# Patient Record
Sex: Female | Born: 1945 | Race: White | Hispanic: No | Marital: Married | Smoking: Never smoker
Health system: Southern US, Community
[De-identification: ages and names within clinical notes are randomized; demographics above are authoritative.]

## PROBLEM LIST (undated history)

## (undated) DIAGNOSIS — I639 Cerebral infarction, unspecified: Secondary | ICD-10-CM

## (undated) DIAGNOSIS — R198 Other specified symptoms and signs involving the digestive system and abdomen: Secondary | ICD-10-CM

## (undated) DIAGNOSIS — F419 Anxiety disorder, unspecified: Secondary | ICD-10-CM

## (undated) DIAGNOSIS — H269 Unspecified cataract: Secondary | ICD-10-CM

## (undated) DIAGNOSIS — N63 Unspecified lump in unspecified breast: Secondary | ICD-10-CM

## (undated) DIAGNOSIS — K5909 Other constipation: Secondary | ICD-10-CM

## (undated) DIAGNOSIS — M1990 Unspecified osteoarthritis, unspecified site: Secondary | ICD-10-CM

## (undated) DIAGNOSIS — I10 Essential (primary) hypertension: Secondary | ICD-10-CM

## (undated) DIAGNOSIS — D649 Anemia, unspecified: Secondary | ICD-10-CM

## (undated) DIAGNOSIS — T7840XA Allergy, unspecified, initial encounter: Secondary | ICD-10-CM

## (undated) DIAGNOSIS — Z8673 Personal history of transient ischemic attack (TIA), and cerebral infarction without residual deficits: Secondary | ICD-10-CM

## (undated) DIAGNOSIS — R519 Headache, unspecified: Secondary | ICD-10-CM

## (undated) DIAGNOSIS — R112 Nausea with vomiting, unspecified: Secondary | ICD-10-CM

## (undated) DIAGNOSIS — G2581 Restless legs syndrome: Secondary | ICD-10-CM

## (undated) DIAGNOSIS — C801 Malignant (primary) neoplasm, unspecified: Secondary | ICD-10-CM

## (undated) DIAGNOSIS — IMO0002 Reserved for concepts with insufficient information to code with codable children: Secondary | ICD-10-CM

## (undated) DIAGNOSIS — Z8719 Personal history of other diseases of the digestive system: Secondary | ICD-10-CM

## (undated) DIAGNOSIS — L719 Rosacea, unspecified: Secondary | ICD-10-CM

## (undated) DIAGNOSIS — K219 Gastro-esophageal reflux disease without esophagitis: Secondary | ICD-10-CM

## (undated) DIAGNOSIS — J45998 Other asthma: Secondary | ICD-10-CM

## (undated) DIAGNOSIS — Z8709 Personal history of other diseases of the respiratory system: Secondary | ICD-10-CM

## (undated) DIAGNOSIS — T753XXA Motion sickness, initial encounter: Secondary | ICD-10-CM

## (undated) DIAGNOSIS — R351 Nocturia: Secondary | ICD-10-CM

## (undated) DIAGNOSIS — L570 Actinic keratosis: Secondary | ICD-10-CM

## (undated) DIAGNOSIS — G4733 Obstructive sleep apnea (adult) (pediatric): Secondary | ICD-10-CM

## (undated) DIAGNOSIS — I5189 Other ill-defined heart diseases: Secondary | ICD-10-CM

## (undated) DIAGNOSIS — J189 Pneumonia, unspecified organism: Secondary | ICD-10-CM

## (undated) DIAGNOSIS — M5030 Other cervical disc degeneration, unspecified cervical region: Secondary | ICD-10-CM

## (undated) DIAGNOSIS — J45909 Unspecified asthma, uncomplicated: Secondary | ICD-10-CM

## (undated) DIAGNOSIS — I272 Pulmonary hypertension, unspecified: Secondary | ICD-10-CM

## (undated) DIAGNOSIS — G4730 Sleep apnea, unspecified: Secondary | ICD-10-CM

## (undated) DIAGNOSIS — Z9889 Other specified postprocedural states: Secondary | ICD-10-CM

## (undated) DIAGNOSIS — Z9289 Personal history of other medical treatment: Secondary | ICD-10-CM

## (undated) DIAGNOSIS — K449 Diaphragmatic hernia without obstruction or gangrene: Secondary | ICD-10-CM

## (undated) DIAGNOSIS — N393 Stress incontinence (female) (male): Secondary | ICD-10-CM

## (undated) HISTORY — PX: VAGINAL HYSTERECTOMY: SUR661

## (undated) HISTORY — DX: Anxiety disorder, unspecified: F41.9

## (undated) HISTORY — PX: BREAST EXCISIONAL BIOPSY: SUR124

## (undated) HISTORY — DX: Gastro-esophageal reflux disease without esophagitis: K21.9

## (undated) HISTORY — DX: Pulmonary hypertension, unspecified: I27.20

## (undated) HISTORY — DX: Cerebral infarction, unspecified: I63.9

## (undated) HISTORY — DX: Allergy, unspecified, initial encounter: T78.40XA

## (undated) HISTORY — DX: Unspecified cataract: H26.9

## (undated) HISTORY — DX: Other cervical disc degeneration, unspecified cervical region: M50.30

## (undated) HISTORY — DX: Sleep apnea, unspecified: G47.30

## (undated) HISTORY — DX: Other ill-defined heart diseases: I51.89

## (undated) HISTORY — DX: Obstructive sleep apnea (adult) (pediatric): G47.33

## (undated) HISTORY — DX: Unspecified asthma, uncomplicated: J45.909

## (undated) HISTORY — DX: Rosacea, unspecified: L71.9

## (undated) HISTORY — DX: Actinic keratosis: L57.0

## (undated) HISTORY — PX: UPPER GASTROINTESTINAL ENDOSCOPY: SHX188

## (undated) HISTORY — PX: SHOULDER ARTHROSCOPY DISTAL CLAVICLE EXCISION AND OPEN ROTATOR CUFF REPAIR: SHX2396

---
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
## 1983-12-01 DIAGNOSIS — I639 Cerebral infarction, unspecified: Secondary | ICD-10-CM

## 1983-12-01 HISTORY — DX: Cerebral infarction, unspecified: I63.9

## 1999-08-18 ENCOUNTER — Ambulatory Visit (HOSPITAL_COMMUNITY): Admission: RE | Admit: 1999-08-18 | Discharge: 1999-08-18 | Payer: Self-pay | Admitting: Gastroenterology

## 2000-02-04 ENCOUNTER — Other Ambulatory Visit: Admission: RE | Admit: 2000-02-04 | Discharge: 2000-02-04 | Payer: Self-pay | Admitting: Family Medicine

## 2001-04-15 ENCOUNTER — Encounter: Admission: RE | Admit: 2001-04-15 | Discharge: 2001-04-15 | Payer: Self-pay | Admitting: Gastroenterology

## 2001-04-15 ENCOUNTER — Encounter: Payer: Self-pay | Admitting: Gastroenterology

## 2002-03-03 ENCOUNTER — Encounter: Payer: Self-pay | Admitting: Family Medicine

## 2002-03-03 ENCOUNTER — Encounter: Admission: RE | Admit: 2002-03-03 | Discharge: 2002-03-03 | Payer: Self-pay | Admitting: Family Medicine

## 2003-10-04 ENCOUNTER — Ambulatory Visit (HOSPITAL_COMMUNITY): Admission: RE | Admit: 2003-10-04 | Discharge: 2003-10-04 | Payer: Self-pay | Admitting: Family Medicine

## 2003-11-13 ENCOUNTER — Encounter: Admission: RE | Admit: 2003-11-13 | Discharge: 2003-11-13 | Payer: Self-pay | Admitting: Family Medicine

## 2004-04-14 ENCOUNTER — Encounter (INDEPENDENT_AMBULATORY_CARE_PROVIDER_SITE_OTHER): Payer: Self-pay | Admitting: *Deleted

## 2004-04-14 ENCOUNTER — Ambulatory Visit (HOSPITAL_COMMUNITY): Admission: RE | Admit: 2004-04-14 | Discharge: 2004-04-14 | Payer: Self-pay | Admitting: Gastroenterology

## 2004-11-25 ENCOUNTER — Emergency Department (HOSPITAL_COMMUNITY): Admission: EM | Admit: 2004-11-25 | Discharge: 2004-11-25 | Payer: Self-pay | Admitting: Emergency Medicine

## 2004-11-28 ENCOUNTER — Ambulatory Visit: Payer: Self-pay | Admitting: Internal Medicine

## 2004-12-10 ENCOUNTER — Ambulatory Visit: Payer: Self-pay

## 2004-12-10 ENCOUNTER — Ambulatory Visit: Payer: Self-pay | Admitting: Internal Medicine

## 2005-01-01 ENCOUNTER — Ambulatory Visit: Payer: Self-pay | Admitting: Internal Medicine

## 2006-07-14 ENCOUNTER — Encounter: Admission: RE | Admit: 2006-07-14 | Discharge: 2006-07-14 | Payer: Self-pay | Admitting: Family Medicine

## 2006-07-14 ENCOUNTER — Encounter (INDEPENDENT_AMBULATORY_CARE_PROVIDER_SITE_OTHER): Payer: Self-pay | Admitting: Diagnostic Radiology

## 2007-02-23 ENCOUNTER — Encounter: Admission: RE | Admit: 2007-02-23 | Discharge: 2007-02-23 | Payer: Self-pay | Admitting: Family Medicine

## 2007-03-14 ENCOUNTER — Encounter: Admission: RE | Admit: 2007-03-14 | Discharge: 2007-03-14 | Payer: Self-pay | Admitting: Family Medicine

## 2007-06-21 ENCOUNTER — Encounter: Admission: RE | Admit: 2007-06-21 | Discharge: 2007-06-21 | Payer: Self-pay | Admitting: Family Medicine

## 2007-09-14 ENCOUNTER — Ambulatory Visit: Payer: Self-pay | Admitting: Cardiology

## 2007-09-14 ENCOUNTER — Inpatient Hospital Stay (HOSPITAL_COMMUNITY): Admission: EM | Admit: 2007-09-14 | Discharge: 2007-09-15 | Payer: Self-pay | Admitting: Emergency Medicine

## 2007-09-14 ENCOUNTER — Encounter (INDEPENDENT_AMBULATORY_CARE_PROVIDER_SITE_OTHER): Payer: Self-pay | Admitting: Neurology

## 2007-09-14 HISTORY — PX: OTHER SURGICAL HISTORY: SHX169

## 2007-09-30 ENCOUNTER — Encounter: Admission: RE | Admit: 2007-09-30 | Discharge: 2007-09-30 | Payer: Self-pay | Admitting: Gastroenterology

## 2007-12-01 HISTORY — PX: OTHER SURGICAL HISTORY: SHX169

## 2007-12-01 HISTORY — PX: CARPAL TUNNEL RELEASE: SHX101

## 2008-02-09 ENCOUNTER — Ambulatory Visit (HOSPITAL_BASED_OUTPATIENT_CLINIC_OR_DEPARTMENT_OTHER): Admission: RE | Admit: 2008-02-09 | Discharge: 2008-02-09 | Payer: Self-pay | Admitting: Orthopedic Surgery

## 2008-03-15 ENCOUNTER — Other Ambulatory Visit: Admission: RE | Admit: 2008-03-15 | Discharge: 2008-03-15 | Payer: Self-pay | Admitting: Family Medicine

## 2008-04-05 ENCOUNTER — Ambulatory Visit (HOSPITAL_BASED_OUTPATIENT_CLINIC_OR_DEPARTMENT_OTHER): Admission: RE | Admit: 2008-04-05 | Discharge: 2008-04-05 | Payer: Self-pay | Admitting: Orthopedic Surgery

## 2008-07-09 ENCOUNTER — Encounter: Admission: RE | Admit: 2008-07-09 | Discharge: 2008-07-09 | Payer: Self-pay | Admitting: Family Medicine

## 2009-01-08 ENCOUNTER — Ambulatory Visit: Payer: Self-pay | Admitting: Diagnostic Radiology

## 2009-01-08 ENCOUNTER — Emergency Department (HOSPITAL_BASED_OUTPATIENT_CLINIC_OR_DEPARTMENT_OTHER): Admission: EM | Admit: 2009-01-08 | Discharge: 2009-01-08 | Payer: Self-pay | Admitting: Emergency Medicine

## 2009-10-03 ENCOUNTER — Encounter: Admission: RE | Admit: 2009-10-03 | Discharge: 2009-10-03 | Payer: Self-pay | Admitting: Family Medicine

## 2010-10-14 ENCOUNTER — Encounter: Admission: RE | Admit: 2010-10-14 | Discharge: 2010-10-14 | Payer: Self-pay | Admitting: Family Medicine

## 2011-04-14 NOTE — Op Note (Signed)
NAMEMarland Vang  METZTLI, SACHDEV             ACCOUNT NO.:  [id number]   MEDICAL RECORD NO.:  [id number]          PATIENT TYPE:  AMB   LOCATION:  DSC                          FACILITY:  [facility]   PHYSICIAN:  Cindee Salt, M.D.       DATE OF BIRTH:  1946/09/18   DATE OF PROCEDURE:  02/09/2008  DATE OF DISCHARGE:                               OPERATIVE REPORT   PREOPERATIVE DIAGNOSIS:  Stenosing tenosynovitis right thumb, carpal  tunnel release right hand.   POSTOPERATIVE DIAGNOSIS:  Stenosing tenosynovitis right thumb, carpal  tunnel release right hand.   PROCEDURE:  Decompression right median nerve, release A1 pulley right  thumb.   SURGEON:  Cindee Salt, M.D.   ASSISTANTCarolyne Fiscal.   ANESTHESIA:  General.   ANESTHESIOLOGIST:  Quita Skye. Krista Blue, M.D.   INDICATIONS FOR PROCEDURE:  The patient is a 64 year old female with a  history of triggering of her right thumb not responsive to conservative  treatment, carpal tunnel syndrome, EMG nerve conduction is positive,  also not responsive to conservative treatment.  She has elected to  undergo surgical decompression.  Postoperative course has been discussed  with the patient along with risks and complications.  She is aware there  is no guarantee with surgery.  Possibility of infection, recurrence of  injury  to nerves, tendons, resulting in symptoms of dystrophy in the  preoperative area the patient had seen.  Questions again encouraged and  answered.  The extremity marked by both patient and surgeon.  Antibiotics given.   DESCRIPTION OF PROCEDURE:  The patient is brought to the operating room  where a general anesthesia was carried out without difficulty.  She was  prepped using DuraPrep in the supine position, right arm free.  A  transverse incision was made over the A1 pulley of the right thumb and  carried down through the subcutaneous tissue.  Bleeders were  electrocauterized.  Neurovascular structures were identified and  protected.   Retractors were placed.  An incision was then made on the  radial aspect of the A1 pulley protecting the oblique pulley.  Thumb  placed through a full range of motion.  Compression to the tendon was  apparent.  No further triggering was noted.  This was irrigated and  closed with interrupted 4-0 Vicryl Rapide sutures.  A separate  longitudinal incision was then made in the palm, carried down through  the subcutaneous tissue.  Bleeders were again electrocauterized and  dissection carried down to the flexor retinaculum.  The superficial  palmar arch was identified.  The flexor tendon in the little finger  identified to the ulnar side of the median nerve.  Carpal retinaculum  was incised with sharp dissection.  Right angle and Sewell retractor  were placed between the skin and forearm fascia.  The fascia was  released for approximately 1.5 cm proximal to the wrist crease under  direct vision.  Canal was explored.  Area of compression to the nerve  was apparent.  No further lesions were identified.  The wound was  irrigated.  The skin was then closed  with interrupted 4-0 Vicryl  Rapid sutures.  A sterile compressive  dressing and wrist splint with the fingers left free.  The patient  tolerated the procedure well and was taken to the recovery room for  observation in satisfactory condition.  She will be discharged home to  return to the [facility] [place] in one week on Vicodin.           ______________________________  Cindee Salt, M.D.     GK/MEDQ  D:  02/09/2008  T:  02/09/2008  Job:  [id number]   cc:   Dario Guardian, M.D.

## 2011-04-14 NOTE — Discharge Summary (Signed)
NAMEJANEAL, ABADI             ACCOUNT NO.:  [id number]   MEDICAL RECORD NO.:  [id number]          PATIENT TYPE:  INP   LOCATION:  3739                         FACILITY:  [facility]   PHYSICIAN:  Pramod P. Pearlean Brownie, MD    DATE OF BIRTH:  1946/11/12   DATE OF ADMISSION:  09/13/2007  DATE OF DISCHARGE:  09/15/2007                               DISCHARGE SUMMARY   DISCHARGE DIAGNOSES:  1. Transient ischemic attack with symptoms resolved.  2. Severe dyspepsia.  3. Hypertension.  4. Hypokalemia, resolved.   DISCHARGE MEDICATIONS:  1. Aciphex 20 mg a day.  2. Hydrochlorothiazide 25 mg a day.  3. Potassium 20 mEq a day.  4. Zoloft 25 mg a day.  5. Ambien 5 mg q.h.s.  6. Zyrtec 1/2 tablet a day.  7. Fish oil 1000 mg b.i.d.  8. Estroven 1 a day.  9. Calcium with D 1200 mg a day.  10.Vitamin C 1000 mg a day.  11.Vitamin E 400 international units a day.  12.Ginkgo biloba 30 mg b.i.d.  13.Plavix 75 mg a day.   STUDIES PERFORMED:  1. CT of the brain on admission shows no acute abnormality.  2. Chest x-ray shows no acute disease.  3. MRI of the brain shows no acute stroke.  4. MRA of the brain negative.  5. 2-D echocardiogram shows EF of 65% with no source of embolus.  6. Transcranial Doppler performed, results pending.  7. Carotid Doppler pending at time of discharge.  8. EKG shows normal sinus rhythm, cannot rule out anterior infarct.   LABORATORY STUDIES:  CBC with hemoglobin 11.7, hematocrit 33.9,  otherwise normal.  Chemistry with glucose 101, otherwise normal.  Coagulation studies normal.  Liver function tests normal.  Cardiac  enzymes negative.  Cholesterol 171, triglycerides 208, HDL 29, LDL 100.  Urinalysis is negative.  Homocystine 6.3.  Urine drug screen positive  for benzodiazepines.  Alcohol level less than 5.  Urine pregnancy  negative.  Hemoglobin A1c 5.4.   HISTORY OF PRESENT ILLNESS:  Amy Vang is a 65 year old right-handed  Caucasian female with multiple medical  problems who presents with acute  onset at 10:15 p.m. of generalized weakness, left upper greater than  left lower extremity heaviness and weakness as well as dysarthria.  The  patient also complained of a headache at the top of the head, some  nausea, and blurry double vision which resolved.  She had some dizziness  and unsteady feelings which also resolved.  She remains with slight  heaviness on the left side.  The family feels her speech is still  slurred.  She was admitted to the hospital for further stroke  evaluation.  She was not a TPA candidate secondary to quick resolution  of symptoms.   HOSPITAL COURSE:  MRI was negative for acute stroke.  It is felt that  the patient had a TIA with symptoms that resolved.  She was placed on  Plavix for secondary stroke prevention, as she has severe dyspepsia and  unable to tolerate aspirin, including low-dose aspirin and Aggrenox.  The patient has vascular risk factors of some mild  dyslipidemia for  which she is already on a statin.  She is slightly overweight and has  hypertension.  In the hospital, she had some hypokalemia which was  treated and resolved.  She has no therapy needs.  Therefore, she  is  stable for discharge home.  Will consider outpatient IRIS trial for her  at time of followup.   CONDITION ON DISCHARGE:  Patient alert and oriented x3.  No aphasia.  No  dysarthria.  Her eye movements are full, and she has no focal weakness   DISCHARGE PLAN:  1. Discharge home with family.  2. Plavix for stroke prevention.  3. Ongoing risk factor control by primary care physician, with goal      LDL less than 100, goal systolic blood pressure less than 130, and      goal hemoglobin A1c less than 6.5.  4. Follow up with primary care physician within 1 month.  5. Follow up with Dr. Delia Heady in 2-3 months.  6. Consider IRIS trial as outpatient.      Annie Main, N.P.    ______________________________  Sunny Schlein. Pearlean Brownie, MD     SB/MEDQ  D:  09/15/2007  T:  09/16/2007  Job:  [id number]   cc:   Pramod P. Pearlean Brownie, MD

## 2011-04-14 NOTE — H&P (Signed)
NAMEAASIA, PEAVLER             ACCOUNT NO.:  [id number]   MEDICAL RECORD NO.:  [id number]          PATIENT TYPE:  INP   LOCATION:  3731                         FACILITY:  [facility]   PHYSICIAN:  Bevelyn Buckles. Champey, M.D.DATE OF BIRTH:  04/17/1946   DATE OF ADMISSION:  09/13/2007  DATE OF DISCHARGE:                              HISTORY & PHYSICAL   REASON FOR ADMISSION:  Code stroke.   HISTORY OF PRESENT ILLNESS:  Ms. Hardie is a 65 year old Caucasian  female with multiple medical problems, who presents with acute onset  this evening (10:15 p.m.) of generalized weakness, left upper greater  than lower extremity heaviness and weakness, and dysarthria.  The  patient also complained of headache in the top of the head, slight  nausea; and also complained of blurry double vision transiently --  which resolved.  She also had some slight dizziness and unsteadiness  feeling which also resolved.   The patient's symptoms have completely resolved.  However, the patient  still feels she has slight heaviness on the left.  The family also feels  like her speech is slurred and dry.  She denies any numbness, tingling,  balance problems, falls or loss of consciousness.   PAST MEDICAL HISTORY:  Positive for hypertension, reflux and depression.   CURRENT MEDICATIONS:  Hydrochlorothiazide, potassium chloride, Aciphex,  Zoloft, vitamin B.   ALLERGIES:  PENICILLIN.   FAMILY HISTORY:  Positive for heart disease.   SOCIAL HISTORY:  The patient lives with her husband.  Denies any smoking  or alcohol use.   REVIEW OF SYSTEMS:  Positive as per HPI.  Review of systems negative as  per HPI in greater than eight other organ systems.   PHYSICAL EXAMINATION:  VITALS:  Temperature 97.7, blood pressure 135/63,  pulse 71, respirations 20, O2 saturation 100%.  HEENT:  Normocephalic, atraumatic.  Extraocular muscles intact.  Pupils  equal, round and react to light.  NECK:  Supple.  No carotid bruits.  HEART:  Regular.  LUNGS: Clear.  ABDOMEN: Soft.  EXTREMITIES:  Show good pulses.  NEUROLOGICAL EXAMINATION:  The patient is awake and alert.  Language is  fluent.  No aphasia is present.  The patient was following commands  appropriately.  Cranial nerves II-XII were grossly intact.  Motor  examination shows 4+/5 strength and normal tone in upper and lower  extremities.  No drift is noted.  There might be some questionable  subtle left lower extremity drift; however, this is inconsistent or  repetitive testing.  Sensory examination is within normal limits at  touch, pinprick no extension on the cleft is noted.  Reflexes are 2+  throughout and symmetric.  Toes are neutral bilaterally.  Cerebellar  function is within normal limits.  Finger-to-nose and heel-to-shin.  Gait was not assessed secondary to safety.   CT of the head showed no acute abnormalities.  NIH stroke scale was 0-1.   LABS:  WC 8.4, hemoglobin 12.5 hematocrit 36.2, platelets 270.  PT 12.1,  INR 0.9, PTT 29.   IMPRESSION:  A 65 year old with transient dysarthria, left-sided  weakness, blurred vision/double vision; symptoms have completely  resolved.  The patient is TIA.  Will admit the patient to stroke MD  service.  Will start Plavix per day, as the patient cannot take aspirin  secondary to her reflux and stomach irritation.  Willl an MRI/MRA of the  brain, 2-D echocardiogram, carotid Dopplers, lipids, and homocysteine  level.  Will continue her home medications.  Her husband will bring the  medications and doses in the morning.  Will get PT, OT consult and place  on DVT prophylaxis.  Will follow up on the patient.      Bevelyn Buckles. Nash Shearer, M.D.  Electronically Signed     DRC/MEDQ  D:  09/14/2007  T:  09/14/2007  Job:  [id number]

## 2011-04-14 NOTE — Op Note (Signed)
NAMEANAHID, Amy Vang             ACCOUNT NO.:  [id number]   MEDICAL RECORD NO.:  [id number]          PATIENT TYPE:  AMB   LOCATION:  DSC                          FACILITY:  [facility]   PHYSICIAN:  Cindee Salt, M.D.       DATE OF BIRTH:  02-03-1946   DATE OF PROCEDURE:  04/05/2008  DATE OF DISCHARGE:                               OPERATIVE REPORT   PREOPERATIVE DIAGNOSIS:  Carpal tunnel syndrome, left hand.   POSTOPERATIVE DIAGNOSIS:  Carpal tunnel syndrome, left hand.   OPERATION:  Decompression, left median nerve.   SURGEON:  Cindee Salt, MD   ASSISTANT:  Joaquin Courts, RN   ANESTHESIA:  Forearm-based IV regional anesthesia.   DATE OF OPERATION:  Apr 05, 2008.   ANESTHESIOLOGIST:  Zenon Mayo, MD.   HISTORY:  The patient is a 65 year old female with a history of carpal  tunnel syndrome.  EMG nerve conductions are positive.  This has not  responded to conservative treatment.  She has elected to proceed to have  surgical decompression of this.   Postoperative course has been discussed along with risks and  complications.  She is aware that there is no guarantee with the  surgery, possibility of infection, recurrence, injury to arteries,  nerves, tendons, incomplete relief of symptoms, and dystrophy.  Questions have been encouraged and answered.  In the preoperative area,  the patient is seen.  The extremity is marked by both the patient and  surgeon.  Antibiotic is given.   PROCEDURE:  The patient was brought to the operating room where a  forearm-based IV regional anesthetic was carried out without difficulty.  She was prepped and draped using DuraPrep, supine position.  After a  time-out was performed, a longitudinal incision was made in the palm,  carried down through the subcutaneous tissue.  Bleeders were  electrocauterized.  Palmar fascia was split.  Superficial palmar arch  was identified.  The flexor tendon to the ring and little finger was  identified to the  ulnar side of the median nerve.  The carpal  retinaculum was incised with sharp dissection.  Right angle and Sewall  retractor were placed between skin and forearm fascia.  The fascia was  released for approximately 1.5 cm proximal to the wrist crease under  direct vision.  Canal was explored.  Tenosynovium tissue was moderately  thickened and hourglass deformity to the nerve was apparent with the  area of hyperemia.  No further lesions were identified.  The wound was  irrigated.  The skin was then closed with interrupted  5-0 Vicryl Rapide sutures.  A sterile compressive dressing and splint  with fingers free was applied.  The patient tolerated the procedure well  and was taken to the recovery room for observation in satisfactory  condition.  She is discharged home and to return to the [facility] in  [place] in 1 week on Vicodin.           ______________________________  Cindee Salt, M.D.     GK/MEDQ  D:  04/05/2008  T:  04/06/2008  Job:  [id number]   cc:  Henry Russel

## 2011-04-17 NOTE — Op Note (Signed)
NAME:  Amy Vang, Amy Vang                       ACCOUNT NO.:  [id number]   MEDICAL RECORD NO.:  [id number]                   PATIENT TYPE:  AMB   LOCATION:  ENDO                                 FACILITY:  [facility]   PHYSICIAN:  Petra Kuba, M.D.                 DATE OF BIRTH:  1946-11-07   DATE OF PROCEDURE:  DATE OF DISCHARGE:                                 OPERATIVE REPORT   PROCEDURE:  Colonoscopy with hot biopsy.   ENDOSCOPIST:  Petra Kuba, M.D.   INDICATION:  Bright red blood per rectum, due for colonic screening.  Consent was signed after risks, benefits, methods, and options were  thoroughly discussed in the office in the past.   MEDICINES USED:  Demerol 40, Versed 8.   DESCRIPTION OF PROCEDURE:  Rectal inspection was pertinent for external  hemorrhoids, small.  Digital exam is negative.  The videocolonoscope was  inserted and easily advanced around the colon to the cecum.  This did not  require any abdominal pressure or position changes.  On insertion, a small  distal transverse polyp was seen and was cold biopsied x2 on insertion.  No  other abnormalities, bu some early left-sided diverticula were seen.  The  cecum was identified by the appendiceal orifice and the ileocecal valve.  In  fact, the scope was inserted a short ways into the terminal ileum which was  normal.  Photo documentation was obtained.  The scope was slowly withdrawn.  The prep was adequate.  There was some liquid stool that required washing  and suctioning.  On slow withdrawal through the colon, the right side was  normal.  As the scope was withdrawn around the transverse, another tiny  polyp just proximal to the one cold biopsied on insertion was seen and hot  biopsied x1 and put in the same container.  We withdrew back to the cold  biopsy site.  There was no obvious polypoid tissue remaining, but we did hot  biopsy the area one time, and put that pathology in the same container.  The  scope was  further withdrawn.  Other than some early left-sided diverticula,  no additional findings were seen, as we slowly withdrew back into the  rectum.  Anorectal pull through in retroflexion back into the rectum  confirmed some small hemorrhoids.  The scope was reinserted and advanced  back to the transverse.  Both polypectomy sites were seen and looked good.  Air was suctioned, and the scope removed.  The patient tolerated the  procedure well.  There was no obvious immediate complication.  Air was  suctioned on slow withdrawal.   ENDOSCOPIC DIAGNOSES:  1. Internal and external small hemorrhoids.  2. Rare early left-sided diverticula.  3. Two distal transverse small polyps, hot biopsied, one cold biopsied on     insertion.  4. Otherwise within normal limits to the terminal ileum.   PLAN:  Await pathology,  probably recheck colon screening in five years.  Happy to see back p.r.n..  Otherwise return care to Dr. Katrinka Blazing for the  customary healthcare maintenance to include yearly rectals and guaiacs.                                               Petra Kuba, M.D.    MEM/MEDQ  D:  04/14/2004  T:  04/14/2004  Job:  [id number]   cc:   Everlean Patterson, M.D.

## 2011-08-14 ENCOUNTER — Ambulatory Visit (HOSPITAL_BASED_OUTPATIENT_CLINIC_OR_DEPARTMENT_OTHER): Admission: RE | Admit: 2011-08-14 | Payer: BC Managed Care – PPO | Source: Ambulatory Visit | Admitting: Urology

## 2011-08-24 LAB — POCT HEMOGLOBIN-HEMACUE: Hemoglobin: 12.3

## 2011-08-24 LAB — BASIC METABOLIC PANEL
BUN: 6
Chloride: 100
Glucose, Bld: 88

## 2011-09-07 ENCOUNTER — Other Ambulatory Visit: Payer: Self-pay | Admitting: Family Medicine

## 2011-09-07 DIAGNOSIS — Z1231 Encounter for screening mammogram for malignant neoplasm of breast: Secondary | ICD-10-CM

## 2011-09-09 LAB — CK TOTAL AND CKMB (NOT AT ARMC)
CK, MB: 2
Relative Index: INVALID

## 2011-09-09 LAB — CBC
Platelets: 248
RDW: 12.5
WBC: 6.8

## 2011-09-09 LAB — PROTIME-INR
INR: 0.9
Prothrombin Time: 12.2

## 2011-09-09 LAB — COMPREHENSIVE METABOLIC PANEL
AST: 25
CO2: 28
Calcium: 8.7
Creatinine, Ser: 0.55
Glucose, Bld: 138 — ABNORMAL HIGH
Sodium: 139
Total Bilirubin: 0.6

## 2011-09-09 LAB — URINALYSIS, ROUTINE W REFLEX MICROSCOPIC
Ketones, ur: NEGATIVE
Nitrite: NEGATIVE
Protein, ur: NEGATIVE
Urobilinogen, UA: 0.2
pH: 7

## 2011-09-09 LAB — POCT I-STAT 3, ART BLOOD GAS (G3+)
Operator id: 283401
pCO2 arterial: 45.7 — ABNORMAL HIGH
pO2, Arterial: 89

## 2011-09-09 LAB — BASIC METABOLIC PANEL
Chloride: 103
Creatinine, Ser: 0.68
GFR calc Af Amer: >60
GFR calc non Af Amer: >60
Potassium: 3.8

## 2011-09-09 LAB — PREGNANCY, URINE: Preg Test, Ur: NEGATIVE

## 2011-09-09 LAB — DIFFERENTIAL
Basophils Absolute: 0
Basophils Relative: 0
Eosinophils Absolute: 0.1
Eosinophils Relative: 2
Lymphs Abs: 3.1
Neutro Abs: 3

## 2011-09-09 LAB — LIPID PANEL
Total CHOL/HDL Ratio: 5.9
Triglycerides: 208 — ABNORMAL HIGH
VLDL: 42 — ABNORMAL HIGH

## 2011-09-09 LAB — TROPONIN I: Troponin I: 0.01

## 2011-09-10 LAB — COMPREHENSIVE METABOLIC PANEL
ALT: 28
AST: 28
Albumin: 4
CO2: 28
Calcium: 9.1
Creatinine, Ser: 0.6
GFR calc Af Amer: >60
GFR calc non Af Amer: >60
Sodium: 138
Total Protein: 6.6

## 2011-09-10 LAB — CK TOTAL AND CKMB (NOT AT ARMC)
CK, MB: 2.3
Relative Index: INVALID
Total CK: 93

## 2011-09-10 LAB — PROTIME-INR
INR: 0.9
Prothrombin Time: 12.1

## 2011-09-10 LAB — DIFFERENTIAL
Basophils Absolute: 0
Basophils Relative: 0
Eosinophils Absolute: 0.1
Eosinophils Relative: 2
Lymphocytes Relative: 50 — ABNORMAL HIGH
Lymphs Abs: 4.3 — ABNORMAL HIGH
Monocytes Absolute: 0.8 — ABNORMAL HIGH
Monocytes Relative: 10
Neutro Abs: 3.2
Neutrophils Relative %: 38 — ABNORMAL LOW

## 2011-09-10 LAB — APTT: aPTT: 29

## 2011-09-10 LAB — CBC
MCV: 87.8
RBC: 4.12
WBC: 8.4

## 2011-09-10 LAB — TROPONIN I: Troponin I: 0.01

## 2011-10-20 ENCOUNTER — Other Ambulatory Visit: Payer: Self-pay | Admitting: Urology

## 2011-10-27 ENCOUNTER — Ambulatory Visit: Payer: BC Managed Care – PPO

## 2011-10-27 ENCOUNTER — Other Ambulatory Visit (HOSPITAL_COMMUNITY): Payer: Self-pay | Admitting: Gastroenterology

## 2011-10-27 DIAGNOSIS — R4702 Dysphasia: Secondary | ICD-10-CM

## 2011-10-30 ENCOUNTER — Ambulatory Visit (HOSPITAL_COMMUNITY)
Admission: RE | Admit: 2011-10-30 | Discharge: 2011-10-30 | Disposition: A | Discharge status: Home / Self Care | Payer: BC Managed Care – PPO | Source: Ambulatory Visit | Attending: Gastroenterology | Admitting: Gastroenterology

## 2011-10-30 DIAGNOSIS — R1310 Dysphagia, unspecified: Secondary | ICD-10-CM | POA: Insufficient documentation

## 2011-10-30 DIAGNOSIS — R4702 Dysphasia: Secondary | ICD-10-CM

## 2011-10-30 DIAGNOSIS — K449 Diaphragmatic hernia without obstruction or gangrene: Secondary | ICD-10-CM | POA: Insufficient documentation

## 2011-10-30 DIAGNOSIS — K224 Dyskinesia of esophagus: Secondary | ICD-10-CM | POA: Insufficient documentation

## 2011-11-12 ENCOUNTER — Ambulatory Visit
Admission: RE | Admit: 2011-11-12 | Discharge: 2011-11-12 | Disposition: A | Discharge status: Home / Self Care | Payer: BC Managed Care – PPO | Source: Ambulatory Visit | Attending: Family Medicine | Admitting: Family Medicine

## 2011-11-12 DIAGNOSIS — Z1231 Encounter for screening mammogram for malignant neoplasm of breast: Secondary | ICD-10-CM

## 2011-12-09 ENCOUNTER — Encounter (HOSPITAL_BASED_OUTPATIENT_CLINIC_OR_DEPARTMENT_OTHER): Payer: Self-pay | Admitting: *Deleted

## 2011-12-09 NOTE — Progress Notes (Signed)
NPO AFTER MN. ARRIVES AT 0945. NEEDS ISTAT AND EKG. WILL TAKE NEXIUM AND ALIGN AM OF SURG. W/ SIP OF WATER.

## 2011-12-10 NOTE — H&P (Signed)
History of Present Illness      Amy Vang is a 66 year old female patient who is self referred for urinary incontinence. She reports that for some time now she has difficulty with leakage of urine when she coughs, sneezes or laughs. She said even when she tries to get out of car she will leak. She also was having difficulty with nocturia getting up about 6 times at night and she was started on VESIcare and noted that that has decreased to 2 times and in addition she has noted decrease in urinary frequency but continues to have what sounds like stress incontinence. She did have a hysterectomy when she was in her late 45s and underwent a "bladder tack" at that time as well. She has been wearing a panty liner because she's started to notice in 1/12 that her panties were becoming damp during the daytime as well. Her incontinence would be considered of moderate severity with no associated signs or symptoms.   Past Medical History Problems  1. History of  Actinic Keratosis 702.0 2. History of  Esophageal Reflux 530.81 3. History of  Hypertension 401.9 4. History of  Restless Legs Syndrome 333.94 5. History of  Rosacea 695.3 6. History of  Stroke Syndrome 436  Surgical History Problems  1. History of  Breast Surgery Lumpectomy Bilateral 2. History of  Hysterectomy V45.77  Current Meds 1. Align; Therapy: (Recorded:25Jun2012) to 2. Ambien 5 MG Oral Tablet; Therapy: (Recorded:25Jun2012) to 3. Aspirin 81 MG Oral Tablet; Therapy: (Recorded:25Jun2012) to 4. Calcium + D TABS; Therapy: (Recorded:25Jun2012) to 5. Clidinium-Chlordiazepoxide 2.5-5 MG Oral Capsule; Therapy: (Recorded:25Jun2012) to 6. Diazepam 5 MG Oral Tablet; Therapy: (Recorded:25Jun2012) to 7. Fish Oil Concentrate 1000 MG Oral Capsule; Therapy: (Recorded:26Jun2012) to 8. Hydrochlorothiazide 25 MG Oral Tablet; Therapy: (Recorded:25Jun2012) to 9. Hyoscyamine Sulfate 0.125 MG Oral Tablet; Therapy: (Recorded:25Jun2012) to 10. Klor-Con  M20 TBCR; Therapy: (Recorded:25Jun2012) to 11. Mylanta 311-232 MG Oral Tablet; Therapy: (Recorded:25Jun2012) to 12. Naproxen 500 MG Oral Tablet; Therapy: (Recorded:25Jun2012) to 13. Nasonex 50 MCG/ACT Nasal Suspension; Therapy: 09Apr2012 to 14. NexIUM 40 MG Oral Capsule Delayed Release; Therapy: 14Mar2012 to 15. Phenazopyridine HCl 200 MG Oral Tablet; Therapy: 12May2012 to 16. Promethazine HCl 25 MG Oral Tablet; Therapy: (Recorded:25Jun2012) to 17. Requip 2 MG Oral Tablet; Therapy: (Recorded:25Jun2012) to 18. Robinul-Forte 2 MG Oral Tablet; Therapy: (Recorded:25Jun2012) to 19. Singulair 10 MG Oral Tablet; Therapy: (Recorded:25Jun2012) to 20. VESIcare 5 MG Oral Tablet; Therapy: (Recorded:25Jun2012) to 21. Vitamin C TABS; Therapy: (Recorded:25Jun2012) to 22. Xyzal 5 MG Oral Tablet; Therapy: (Recorded:25Jun2012) to 23. Zoloft 50 MG Oral Tablet; Therapy: (Recorded:25Jun2012) to  Allergies Medication  1. Penicillins 2. Septra DS TABS 3. Hydroxyzines  Family History Problems  1. Family history of  Diabetes Mellitus V18.0 2. Family history of  Family Health Status Number Of Children 2 children  Social History Problems  1. Caffeine Use 2 cups per day 2. Marital History - Currently Married 3. Never A Smoker 4. 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 History of  Alcohol Use 6. History of  Tobacco Use   Review of Systems Genitourinary, constitutional, skin, eye, otolaryngeal, hematologic/lymphatic, cardiovascular, pulmonary, endocrine, musculoskeletal, gastrointestinal, neurological and psychiatric system(s) were reviewed and pertinent findings if present are noted.  Genitourinary: urinary frequency, nocturia and incontinence.  Gastrointestinal: heartburn.  ENT: sinus problems.  Respiratory: cough.    Vitals Vital Signs  BMI Calculated: 32.06 BSA Calculated: 1.78 Height: 5 ft 1.5 in Weight: 172 lb  Blood Pressure: 143 / 76 Heart Rate: 75  Physical  Exam Constitutional: Well nourished and well developed . No acute distress.  ENT:. The ears and nose are normal in appearance.  Neck: The appearance of the neck is normal and no neck mass is present.  Pulmonary: No respiratory distress and normal respiratory rhythm and effort.  Cardiovascular: Heart rate and rhythm are normal . No peripheral edema.  Abdomen: The abdomen is soft and nontender. No masses are palpated. No CVA tenderness. No hernias are palpable. No hepatosplenomegaly noted.  Genitourinary: Examination of the external genitalia shows normal female external genitalia and no lesions. The urethra is normal in appearance and not tender. There is no urethral mass. Vaginal exam demonstrates no abnormalities. A cystocele is present (grade 1-2 /4). The cervix is is absent. The uterus is absent. The bladder is non tender and not distended. The anus is normal on inspection. The perineum is normal on inspection.  Lymphatics: The femoral and inguinal nodes are not enlarged or tender.  Skin: Normal skin turgor, no visible rash and no visible skin lesions.  Neuro/Psych:. Mood and affect are appropriate.   Results/Data   Urodynamics (06/04/11): Study was performed to evaluate clinically what appeared to be mixed incontinence. Full urodynamic study was reviewed as noted in chart.  She was initially found to have a PVR of 5 cc. Maximum cystometric capacity was found to be 530 cc with detrusor instability identified and a maximum unstable bladder contraction pressure of 8 cm H2O with mild/moderate leakage. This seemed to improve somewhat with the placement of vaginal packing and reduction of her cystocele. Leak point pressure with 100 cc in the bladder was 65 cm H2O with mild leakage with cough and 68 cm H2O with mild leakage with Valsalva. Pressure flow study which initially revealed a slightly elevated detrusor pressure however was felt to be secondary to her position in the chair and when she  repositions herself and voided without straining she voided with normal pressures and noted that she typically strains to void and when she did not she voided much more freely. Fluoroscopically she was found to have a cystocele with dissent of greater than 3 cm.  It appears she does have some low amplitude detrusor instability associated with some leakage that improved somewhat with reduction of her cystocele and stress urinary incontinence. It would appear that she would benefit from an anterior repair and mid urethral sling.     Assessment Assessed  1. Urge And Stress Incontinence 788.33 2. Cystocele 596.89   I first went over the results of her urodynamic study with her in detail. We then discussed the fact that it appears that when her cystocele was reduced she does note improvement of her detrusor instability and since she clearly has stress urinary incontinence my recommendation has been to correct her cystocele and place a mid urethral sling at the same time. I went over both of these procedures in detail discussing the incision used, the risks and complications and alternatives. We also discussed the anticipated postoperative course. In her case I told her that she would likely see improvement in her detrusor instability although there is a small percentage of cases where the detrusor instability and actually worsens. It is currently controlled with the VESIcare and I told her we would need to determine the continued need for the VESIcare after her surgery. She understands and I have answered all of her questions to her satisfaction. She wishes to proceed with surgical correction as soon as she retires in a couple of months.  Plan Urge And Stress Incontinence (788.33), Cystocele (596.89)  1. Follow-up Schedule Surgery Office  Follow-up  Done: 10Jul2012   She will be scheduled for an anterior repair and trans-obturator sling as an outpatient.

## 2011-12-11 ENCOUNTER — Ambulatory Visit (HOSPITAL_BASED_OUTPATIENT_CLINIC_OR_DEPARTMENT_OTHER)
Admission: RE | Admit: 2011-12-11 | Discharge: 2011-12-11 | Disposition: A | Discharge status: Home / Self Care | Payer: BC Managed Care – PPO | Source: Ambulatory Visit | Attending: Urology | Admitting: Urology

## 2011-12-11 ENCOUNTER — Ambulatory Visit (HOSPITAL_BASED_OUTPATIENT_CLINIC_OR_DEPARTMENT_OTHER): Payer: BC Managed Care – PPO | Admitting: Anesthesiology

## 2011-12-11 ENCOUNTER — Other Ambulatory Visit: Payer: Self-pay

## 2011-12-11 ENCOUNTER — Encounter (HOSPITAL_BASED_OUTPATIENT_CLINIC_OR_DEPARTMENT_OTHER)
Admission: RE | Disposition: A | Discharge status: Home / Self Care | Payer: Self-pay | Source: Ambulatory Visit | Attending: Urology

## 2011-12-11 ENCOUNTER — Encounter (HOSPITAL_BASED_OUTPATIENT_CLINIC_OR_DEPARTMENT_OTHER): Payer: Self-pay | Admitting: Anesthesiology

## 2011-12-11 ENCOUNTER — Encounter (HOSPITAL_BASED_OUTPATIENT_CLINIC_OR_DEPARTMENT_OTHER): Payer: Self-pay | Admitting: *Deleted

## 2011-12-11 DIAGNOSIS — Z90710 Acquired absence of both cervix and uterus: Secondary | ICD-10-CM | POA: Insufficient documentation

## 2011-12-11 DIAGNOSIS — N3946 Mixed incontinence: Secondary | ICD-10-CM | POA: Insufficient documentation

## 2011-12-11 DIAGNOSIS — N8111 Cystocele, midline: Secondary | ICD-10-CM | POA: Insufficient documentation

## 2011-12-11 DIAGNOSIS — Z79899 Other long term (current) drug therapy: Secondary | ICD-10-CM | POA: Insufficient documentation

## 2011-12-11 DIAGNOSIS — Z01812 Encounter for preprocedural laboratory examination: Secondary | ICD-10-CM | POA: Insufficient documentation

## 2011-12-11 DIAGNOSIS — K219 Gastro-esophageal reflux disease without esophagitis: Secondary | ICD-10-CM | POA: Insufficient documentation

## 2011-12-11 DIAGNOSIS — Z01810 Encounter for preprocedural cardiovascular examination: Secondary | ICD-10-CM | POA: Insufficient documentation

## 2011-12-11 DIAGNOSIS — N393 Stress incontinence (female) (male): Secondary | ICD-10-CM

## 2011-12-11 DIAGNOSIS — I10 Essential (primary) hypertension: Secondary | ICD-10-CM | POA: Insufficient documentation

## 2011-12-11 DIAGNOSIS — Z7982 Long term (current) use of aspirin: Secondary | ICD-10-CM | POA: Insufficient documentation

## 2011-12-11 HISTORY — PX: CYSTOCELE REPAIR: SHX163

## 2011-12-11 HISTORY — DX: Other specified symptoms and signs involving the digestive system and abdomen: R19.8

## 2011-12-11 HISTORY — DX: Personal history of transient ischemic attack (TIA), and cerebral infarction without residual deficits: Z86.73

## 2011-12-11 HISTORY — DX: Restless legs syndrome: G25.81

## 2011-12-11 HISTORY — DX: Stress incontinence (female) (male): N39.3

## 2011-12-11 HISTORY — DX: Essential (primary) hypertension: I10

## 2011-12-11 HISTORY — DX: Reserved for concepts with insufficient information to code with codable children: IMO0002

## 2011-12-11 HISTORY — PX: PUBOVAGINAL SLING: SHX1035

## 2011-12-11 HISTORY — DX: Gastro-esophageal reflux disease without esophagitis: K21.9

## 2011-12-11 LAB — POCT I-STAT 4, (NA,K, GLUC, HGB,HCT)
Glucose, Bld: 113 mg/dL — ABNORMAL HIGH (ref 70–99)
HCT: 38 % (ref 36.0–46.0)
Hemoglobin: 12.9 g/dL (ref 12.0–15.0)
Potassium: 3.5 mEq/L (ref 3.5–5.1)
Sodium: 142 mEq/L (ref 135–145)

## 2011-12-11 SURGERY — COLPORRHAPHY, ANTERIOR, FOR CYSTOCELE REPAIR
Anesthesia: General | Site: Urethra | Wound class: Clean Contaminated

## 2011-12-11 MED ORDER — PROPOFOL 10 MG/ML IV EMUL
INTRAVENOUS | Status: DC | PRN
  Administered 2011-12-11: 250 mg via INTRAVENOUS

## 2011-12-11 MED ORDER — FENTANYL CITRATE 0.05 MG/ML IJ SOLN
INTRAMUSCULAR | Status: DC | PRN
  Administered 2011-12-11: 25 ug via INTRAVENOUS
  Administered 2011-12-11: 50 ug via INTRAVENOUS
  Administered 2011-12-11 (×3): 25 ug via INTRAVENOUS

## 2011-12-11 MED ORDER — DROPERIDOL 2.5 MG/ML IJ SOLN
INTRAMUSCULAR | Status: DC | PRN
  Administered 2011-12-11: 0.625 mg via INTRAVENOUS

## 2011-12-11 MED ORDER — LIDOCAINE HCL (CARDIAC) 20 MG/ML IV SOLN
INTRAVENOUS | Status: DC | PRN
  Administered 2011-12-11: 80 mg via INTRAVENOUS

## 2011-12-11 MED ORDER — KETOROLAC TROMETHAMINE 30 MG/ML IJ SOLN: 15.0000 mg | Freq: Once | INTRAMUSCULAR | Status: DC | PRN

## 2011-12-11 MED ORDER — INDIGOTINDISULFONATE SODIUM 8 MG/ML IJ SOLN
INTRAMUSCULAR | Status: DC | PRN
  Administered 2011-12-11: 5 mL via INTRAVENOUS

## 2011-12-11 MED ORDER — CEFAZOLIN SODIUM 1-5 GM-% IV SOLN
1.0000 g | INTRAVENOUS | Status: AC
  Administered 2011-12-11: 1 g via INTRAVENOUS

## 2011-12-11 MED ORDER — SODIUM CHLORIDE 0.9 % IR SOLN
Status: DC | PRN
  Administered 2011-12-11: 12:00:00

## 2011-12-11 MED ORDER — BACITRACIN-NEOMYCIN-POLYMYXIN 400-5-5000 EX OINT
TOPICAL_OINTMENT | CUTANEOUS | Status: DC | PRN
  Administered 2011-12-11: 1 via TOPICAL

## 2011-12-11 MED ORDER — BUPIVACAINE-EPINEPHRINE 0.5% -1:200000 IJ SOLN
INTRAMUSCULAR | Status: DC | PRN
  Administered 2011-12-11: 29 mL

## 2011-12-11 MED ORDER — SCOPOLAMINE 1 MG/3DAYS TD PT72
MEDICATED_PATCH | TRANSDERMAL | Status: DC | PRN
  Administered 2011-12-11: 1 via TRANSDERMAL

## 2011-12-11 MED ORDER — OXYCODONE-ACETAMINOPHEN 10-325 MG PO TABS: 1.0000 | ORAL_TABLET | ORAL | Status: AC | PRN

## 2011-12-11 MED ORDER — ONDANSETRON HCL 4 MG/2ML IJ SOLN
INTRAMUSCULAR | Status: DC | PRN
  Administered 2011-12-11: 4 mg via INTRAVENOUS

## 2011-12-11 MED ORDER — FENTANYL CITRATE 0.05 MG/ML IJ SOLN: 25.0000 ug | INTRAMUSCULAR | Status: DC | PRN

## 2011-12-11 MED ORDER — STERILE WATER FOR IRRIGATION IR SOLN
Status: DC | PRN
  Administered 2011-12-11: 1 via INTRAVESICAL

## 2011-12-11 MED ORDER — LACTATED RINGERS IV SOLN
INTRAVENOUS | Status: DC
  Administered 2011-12-11: 100 mL/h via INTRAVENOUS
  Administered 2011-12-11 (×2): via INTRAVENOUS

## 2011-12-11 SURGICAL SUPPLY — 52 items
ADH SKN CLS APL DERMABOND .7 (GAUZE/BANDAGES/DRESSINGS) ×2
BAG URINE DRAINAGE (UROLOGICAL SUPPLIES) IMPLANT
BLADE SURG 10 STRL SS (BLADE) IMPLANT
BLADE SURG 15 STRL LF DISP TIS (BLADE) ×2 IMPLANT
BLADE SURG 15 STRL SS (BLADE) ×9
BLADE SURG ROTATE 9660 (MISCELLANEOUS) ×3 IMPLANT
CATH FOLEY 2WAY SLVR  5CC 16FR (CATHETERS) ×1
CATH FOLEY 2WAY SLVR 5CC 16FR (CATHETERS) ×2 IMPLANT
CLOTH BEACON ORANGE TIMEOUT ST (SAFETY) ×3 IMPLANT
COVER MAYO STAND STRL (DRAPES) ×3 IMPLANT
COVER TABLE BACK 60X90 (DRAPES) ×1 IMPLANT
DERMABOND ADVANCED (GAUZE/BANDAGES/DRESSINGS) ×1
DERMABOND ADVANCED .7 DNX12 (GAUZE/BANDAGES/DRESSINGS) ×2 IMPLANT
DRAPE LG THREE QUARTER DISP (DRAPES) ×6 IMPLANT
DRAPE UNDERBUTTOCKS STRL (DRAPE) ×3 IMPLANT
ELECT REM PT RETURN 9FT ADLT (ELECTROSURGICAL) ×3
ELECTRODE REM PT RTRN 9FT ADLT (ELECTROSURGICAL) ×2 IMPLANT
GAUZE PACKING IODOFORM 2 (PACKING) ×3 IMPLANT
GAUZE SPONGE 4X4 16PLY XRAY LF (GAUZE/BANDAGES/DRESSINGS) ×1 IMPLANT
GLOVE BIO SURGEON STRL SZ7 (GLOVE) ×3 IMPLANT
GLOVE BIO SURGEON STRL SZ8 (GLOVE) ×3 IMPLANT
GOWN BRE IMP SLV AUR LG STRL (GOWN DISPOSABLE) ×2 IMPLANT
GOWN STRL REIN XL XLG (GOWN DISPOSABLE) ×3 IMPLANT
GOWN XL W/COTTON TOWEL STD (GOWNS) ×3 IMPLANT
IV NS IRRIG 3000ML ARTHROMATIC (IV SOLUTION) IMPLANT
LEGGING LITHOTOMY PAIR STRL (DRAPES) IMPLANT
NEEDLE HYPO 22GX1.5 SAFETY (NEEDLE) ×3 IMPLANT
NS IRRIG 500ML POUR BTL (IV SOLUTION) IMPLANT
OBTRYX HALO SLING IMPLANT
Obtryx Halo Sling ×1 IMPLANT
PACK BASIN DAY SURGERY FS (CUSTOM PROCEDURE TRAY) ×3 IMPLANT
PACK CYSTOSCOPY (CUSTOM PROCEDURE TRAY) ×2 IMPLANT
PAD PREP 24X48 CUFFED NSTRL (MISCELLANEOUS) ×1 IMPLANT
PENCIL BUTTON HOLSTER BLD 10FT (ELECTRODE) ×3 IMPLANT
PLUG CATH AND CAP STER (CATHETERS) ×3 IMPLANT
SUT CHROMIC 4 0 RB 1X27 (SUTURE) ×3 IMPLANT
SUT ETHIBOND 0 (SUTURE) ×4 IMPLANT
SUT SILK 2 0 30  PSL (SUTURE)
SUT SILK 2 0 30 PSL (SUTURE) IMPLANT
SUT VIC AB 2-0 SH 27 (SUTURE) ×3
SUT VIC AB 2-0 SH 27XBRD (SUTURE) ×2 IMPLANT
SUT VIC AB 2-0 UR6 27 (SUTURE) ×6 IMPLANT
SUT VIC AB 3-0 SH 27 (SUTURE) ×3
SUT VIC AB 3-0 SH 27X BRD (SUTURE) ×2 IMPLANT
SYR BULB IRRIGATION 50ML (SYRINGE) ×3 IMPLANT
SYR CONTROL 10ML LL (SYRINGE) ×1 IMPLANT
SYRINGE 10CC LL (SYRINGE) ×3 IMPLANT
TRAY DSU PREP LF (CUSTOM PROCEDURE TRAY) ×3 IMPLANT
TUBE CONNECTING 12X1/4 (SUCTIONS) ×3 IMPLANT
WATER STERILE IRR 3000ML UROMA (IV SOLUTION) ×1 IMPLANT
WATER STERILE IRR 500ML POUR (IV SOLUTION) ×3 IMPLANT
YANKAUER SUCT BULB TIP NO VENT (SUCTIONS) ×3 IMPLANT

## 2011-12-11 NOTE — Transfer of Care (Signed)
Immediate Anesthesia Transfer of Care Note  Patient: Amy Vang  Procedure(s) Performed:  ANTERIOR REPAIR (CYSTOCELE) - 1 1/2 hour requested for case  Anterior repair and mid Urethral Sling; PUBO-VAGINAL SLING  Patient Location: Patient transported to PACU with oxygen via face mask at 6 Liters / Min  Anesthesia Type: General  Level of Consciousness: awake and alert   Airway & Oxygen Therapy: Patient Spontanous Breathing and Patient connected to face mask oxygen Post-op Assessment: Report given to PACU RN and Post -op Vital signs reviewed and stable  Post vital signs: Reviewed and stable  Complications: No apparent anesthesia complications  Filed Vitals:   12/11/11 0955  BP: 138/73  Pulse: 71  Temp: 36.2 C  Resp: 18

## 2011-12-11 NOTE — Interval H&P Note (Signed)
History and Physical Interval Note:  12/11/2011 10:01 AM  Amy Vang  has presented today for surgery, with the diagnosis of cystocele and stress urinary incontinence  The various methods of treatment have been discussed with the patient and family. After consideration of risks, benefits and other options for treatment, the patient has consented to  Procedure(s): ANTERIOR REPAIR (CYSTOCELE) PUBO-VAGINAL SLING as a surgical intervention .  The patients' history has been reviewed, patient examined, no change in status, stable for surgery.  I have reviewed the patients' chart and labs.  Questions were answered to the patient's satisfaction.     Garnett Farm

## 2011-12-11 NOTE — Anesthesia Postprocedure Evaluation (Signed)
  Anesthesia Post-op Note  Patient: Amy Vang  Procedure(s) Performed:  ANTERIOR REPAIR (CYSTOCELE) - 1 1/2 hour requested for case  Anterior repair and mid Urethral Sling; PUBO-VAGINAL SLING  Patient Location: PACU  Anesthesia Type: General  Level of Consciousness: awake and alert   Airway and Oxygen Therapy: Patient Spontanous Breathing  Post-op Pain: mild  Post-op Assessment: Post-op Vital signs reviewed, Patient's Cardiovascular Status Stable, Respiratory Function Stable, Patent Airway and No signs of Nausea or vomiting  Post-op Vital Signs: stable  Complications: No apparent anesthesia complications

## 2011-12-11 NOTE — Progress Notes (Signed)
Report received from A. Allen, RN.

## 2011-12-11 NOTE — Anesthesia Preprocedure Evaluation (Signed)
Anesthesia Evaluation  Patient identified by MRN, date of birth, ID band Patient awake    Reviewed: Allergy & Precautions, H&P , NPO status , Patient's Chart, lab work & pertinent test results  Airway Mallampati: II TM Distance: >3 FB Neck ROM: Full    Dental No notable dental hx.    Pulmonary neg pulmonary ROS,  clear to auscultation  Pulmonary exam normal       Cardiovascular hypertension, Regular Normal    Neuro/Psych Negative Neurological ROS  Negative Psych ROS   GI/Hepatic Neg liver ROS, GERD-  Medicated,  Endo/Other  Negative Endocrine ROS  Renal/GU negative Renal ROS  Genitourinary negative   Musculoskeletal negative musculoskeletal ROS (+)   Abdominal   Peds negative pediatric ROS (+)  Hematology negative hematology ROS (+)   Anesthesia Other Findings   Reproductive/Obstetrics negative OB ROS                           Anesthesia Physical Anesthesia Plan  ASA: II  Anesthesia Plan: General   Post-op Pain Management:    Induction: Intravenous  Airway Management Planned: LMA  Additional Equipment:   Intra-op Plan:   Post-operative Plan:   Informed Consent: I have reviewed the patients History and Physical, chart, labs and discussed the procedure including the risks, benefits and alternatives for the proposed anesthesia with the patient or authorized representative who has indicated his/her understanding and acceptance.   Dental advisory given  Plan Discussed with: CRNA  Anesthesia Plan Comments:         Anesthesia Quick Evaluation

## 2011-12-11 NOTE — Anesthesia Procedure Notes (Addendum)
Procedure Name: LMA Insertion Date/Time: 12/11/2011 11:00 AM Performed by: Lorrin Jackson Pre-anesthesia Checklist: Patient identified, Emergency Drugs available, Suction available and Patient being monitored Patient Re-evaluated:Patient Re-evaluated prior to inductionOxygen Delivery Method: Circle System Utilized Preoxygenation: Pre-oxygenation with 100% oxygen Intubation Type: IV induction Ventilation: Mask ventilation without difficulty LMA: LMA with gastric port inserted LMA Size: 4.0 Number of attempts: 1 Placement Confirmation: positive ETCO2 Tube secured with: Tape Dental Injury: Teeth and Oropharynx as per pre-operative assessment

## 2011-12-11 NOTE — Op Note (Signed)
PATIENT:  Amy Vang  PRE-OPERATIVE DIAGNOSIS: 1. SUI 2. Cystocele  POST-OPERATIVE DIAGNOSIS:  Same  PROCEDURE: 1. sling placement 2. Anterior repair  SURGEON:  Surgeon: Garnett Farm  INDICATION: Amy Vang is a 66 year old female patient who presented with urinary incontinence that occurred with coughing sneezing and laughing. She also had significant difficulty with incontinence whenever she got out of the car. She had been having some nocturia and frequency and was started on some Vesicare but continued to have stress incontinence and was evaluated with urodynamics which revealed low amplitude detrusor instability that was improved with reduction of her cystocele as well as documented stress urinary incontinence due to the external sphincter weakness. We therefore discussed the treatment options and she has elected to proceed with surgical correction.  ANESTHESIA:  General  EBL:  less than 100 mL  DESCRIPTION OF PROCEDURE: After informed consent the patient was brought to the major OR and placed on the table. She was administered general anesthesia and then moved to the dorsal lithotomy position. Her lower abdomen and genitalia including vagina was sterilely prepped and draped. An official timeout was then performed.  A 16 French Foley catheter was placed in the bladder and the bladder was drained. A weighted speculum was then placed in the vagina and I injected 29 cc of Marcaine with epinephrine in the suburethral region. After allowing adequate time for epinephrine effect a midline incision was made over the urethra which was noted by palpation of the Foley catheter tubing and balloon. I then carried my incision back to the apex of the vagina. Allis clamps were placed on the vaginal mucosa on each side and I first started with sharp dissection to dissect the vaginal mucosa from the cystocele. I then dissected further posteriorly on each side using blunt technique until the  pubocervical fascia was identified on each side. 2-0 braided permanent suture was then placed in the pubocervical fascia on each side in a figure-of-eight fashion and tied. As this was performed the cystocele defect was reduced and obliterated. I then excised the excess vaginal mucosa on the inside. The vaginal mucosa was then reapproximated by using a 2-0 Vicryl starting at the posterior aspect of the incision and I then closed the edges of the vaginal mucosa in the midline about halfway in order to maintain access to the area of the mid urethra. I then dissected beneath the vaginal mucosa laterally around the urethra and was then able to insert my index finger and palpate the undersurface of the symphysis pubis. The wound was irrigated with antibiotic solution.  I was able to palpate with a finger lateral to the urethra and my thumb on the external skin at the location of the obturator fossa and could feel the medial aspect of the obturator fossa. I made a Lamere Lightner at this location just slightly lateral to that and at the level of the clitoris. I used the local anesthetic with Marcaine to infiltrate the skin and subcutaneous tissue. A stab incision was then made in these locations. I then made sure the bladder was completely empty by draining it through the Foley catheter. The trocar was then passed through the skin incision, through the obturator fascia at the medial aspect of the obturator fossa and then was directed out against my index finger at the level of the mid urethra noted by palpation of the catheter and the catheter balloon. This was performed first on the left and then the right side.  The Foley  catheter was removed and cystoscopy was performed. The 22 French cystoscope with 70 lens was inserted in the bladder and the bladder was fully and systematically inspected. No tumor stones or inflammatory lesions were seen. Ureteral orifices were of normal configuration and position. There was no evidence of  foreign body or perforation of the bladder noted. I could see clear urine effluxing from each ureteral orifice. I therefore drained the bladder, removed the cystoscope and reinserted the Foley catheter.  The sling material was then affixed to the distal aspect of each of the trochars and withdrawn back through theincisions. I then repeated the cystoscopy again noting no evidence of sling material or foreign body within the bladder. I therefore divided the sling sheath and passed antibiotic solution through the sheath on each side. The sheath was grasped with a hemostat and the sling was then withdrawn into a position at the mid urethra. I placed forceps beneath the urethra and then removed the sheathing first on the left side and then on the right-hand side. This was performed in such a manner that there was no tension noted on the sling material. The excess sling material was then excised at the skin level and the skin incisions were closed with Dermabond after irrigation with antibiotic solution.  Attention was then redirected to the vagina and the remainder of the incision was closed with running 2-0 Vicryl suture. The Foley catheter was removed. 2 inch iodoform gauze with bacitracin was then used as vaginal packing. The patient was then awakened and taken to the recovery room. The patient tolerated the procedure well and there were no intraoperative complications.  PLAN OF CARE: Discharge to home after PACU  PATIENT DISPOSITION:  PACU - hemodynamically stable. And

## 2011-12-14 ENCOUNTER — Encounter (HOSPITAL_BASED_OUTPATIENT_CLINIC_OR_DEPARTMENT_OTHER): Payer: Self-pay | Admitting: Urology

## 2012-04-11 ENCOUNTER — Emergency Department (HOSPITAL_COMMUNITY): Payer: BC Managed Care – PPO

## 2012-04-11 ENCOUNTER — Observation Stay (HOSPITAL_COMMUNITY)
Admission: EM | Admit: 2012-04-11 | Discharge: 2012-04-12 | Disposition: A | Discharge status: Home / Self Care | Payer: BC Managed Care – PPO | Attending: Emergency Medicine | Admitting: Emergency Medicine

## 2012-04-11 ENCOUNTER — Encounter (HOSPITAL_COMMUNITY): Payer: Self-pay

## 2012-04-11 DIAGNOSIS — G459 Transient cerebral ischemic attack, unspecified: Secondary | ICD-10-CM

## 2012-04-11 DIAGNOSIS — G4700 Insomnia, unspecified: Secondary | ICD-10-CM

## 2012-04-11 DIAGNOSIS — R5381 Other malaise: Principal | ICD-10-CM | POA: Insufficient documentation

## 2012-04-11 DIAGNOSIS — R262 Difficulty in walking, not elsewhere classified: Secondary | ICD-10-CM | POA: Insufficient documentation

## 2012-04-11 DIAGNOSIS — F329 Major depressive disorder, single episode, unspecified: Secondary | ICD-10-CM | POA: Insufficient documentation

## 2012-04-11 DIAGNOSIS — K219 Gastro-esophageal reflux disease without esophagitis: Secondary | ICD-10-CM | POA: Insufficient documentation

## 2012-04-11 DIAGNOSIS — F341 Dysthymic disorder: Secondary | ICD-10-CM

## 2012-04-11 DIAGNOSIS — F3289 Other specified depressive episodes: Secondary | ICD-10-CM | POA: Insufficient documentation

## 2012-04-11 DIAGNOSIS — R531 Weakness: Secondary | ICD-10-CM

## 2012-04-11 DIAGNOSIS — R5383 Other fatigue: Secondary | ICD-10-CM | POA: Insufficient documentation

## 2012-04-11 DIAGNOSIS — Z8673 Personal history of transient ischemic attack (TIA), and cerebral infarction without residual deficits: Secondary | ICD-10-CM | POA: Insufficient documentation

## 2012-04-11 DIAGNOSIS — I10 Essential (primary) hypertension: Secondary | ICD-10-CM | POA: Insufficient documentation

## 2012-04-11 LAB — COMPREHENSIVE METABOLIC PANEL
ALT: 38 U/L — ABNORMAL HIGH (ref 0–35)
AST: 35 U/L (ref 0–37)
Albumin: 4.1 g/dL (ref 3.5–5.2)
Alkaline Phosphatase: 52 U/L (ref 39–117)
BUN: 12 mg/dL (ref 6–23)
CO2: 27 mEq/L (ref 19–32)
Calcium: 9.3 mg/dL (ref 8.4–10.5)
Chloride: 98 mEq/L (ref 96–112)
Creatinine, Ser: 0.63 mg/dL (ref 0.50–1.10)
GFR calc Af Amer: >90 mL/min (ref >90)
GFR calc non Af Amer: >90 mL/min (ref >90)
Glucose, Bld: 94 mg/dL (ref 70–99)
Potassium: 3.1 mEq/L — ABNORMAL LOW (ref 3.5–5.1)
Sodium: 136 mEq/L (ref 135–145)
Total Bilirubin: 0.4 mg/dL (ref 0.3–1.2)
Total Protein: 6.9 g/dL (ref 6.0–8.3)

## 2012-04-11 LAB — POCT I-STAT, CHEM 8
Calcium, Ion: 1.2 mmol/L (ref 1.12–1.32)
Chloride: 101 mEq/L (ref 96–112)
HCT: 40 % (ref 36.0–46.0)
Hemoglobin: 13.6 g/dL (ref 12.0–15.0)
Potassium: 3.1 mEq/L — ABNORMAL LOW (ref 3.5–5.1)

## 2012-04-11 LAB — DIFFERENTIAL
Basophils Absolute: 0 10*3/uL (ref 0.0–0.1)
Basophils Relative: 0 % (ref 0–1)
Eosinophils Absolute: 0.1 10*3/uL (ref 0.0–0.7)
Eosinophils Relative: 1 % (ref 0–5)
Lymphocytes Relative: 45 % (ref 12–46)
Lymphs Abs: 3.3 10*3/uL (ref 0.7–4.0)
Monocytes Absolute: 0.7 10*3/uL (ref 0.1–1.0)
Monocytes Relative: 9 % (ref 3–12)
Neutro Abs: 3.4 10*3/uL (ref 1.7–7.7)
Neutrophils Relative %: 45 % (ref 43–77)

## 2012-04-11 LAB — CBC
HCT: 37.7 % (ref 36.0–46.0)
Hemoglobin: 13.2 g/dL (ref 12.0–15.0)
MCH: 30.1 pg (ref 26.0–34.0)
MCHC: 35 g/dL (ref 30.0–36.0)
MCV: 86.1 fL (ref 78.0–100.0)
Platelets: 212 10*3/uL (ref 150–400)
RBC: 4.38 MIL/uL (ref 3.87–5.11)
RDW: 12.8 % (ref 11.5–15.5)
WBC: 7.5 10*3/uL (ref 4.0–10.5)

## 2012-04-11 LAB — TROPONIN I: Troponin I: <0.3 ng/mL (ref <0.30)

## 2012-04-11 LAB — CK TOTAL AND CKMB (NOT AT ARMC)
CK, MB: 3.1 ng/mL (ref 0.3–4.0)
Relative Index: 2 (ref 0.0–2.5)
Total CK: 152 U/L (ref 7–177)

## 2012-04-11 LAB — APTT: aPTT: 29 seconds (ref 24–37)

## 2012-04-11 LAB — PROTIME-INR
INR: 0.93 (ref 0.00–1.49)
Prothrombin Time: 12.7 seconds (ref 11.6–15.2)

## 2012-04-11 MED ORDER — HYDROCHLOROTHIAZIDE 25 MG PO TABS
25.0000 mg | ORAL_TABLET | Freq: Every day | ORAL | Status: DC
  Filled 2012-04-11 (×2): qty 1

## 2012-04-11 MED ORDER — ROPINIROLE HCL 1 MG PO TABS: 2.0000 mg | ORAL_TABLET | Freq: Every day | ORAL | Status: DC

## 2012-04-11 MED ORDER — SODIUM CHLORIDE 0.9 % IV SOLN
INTRAVENOUS | Status: DC
  Administered 2012-04-11: 17:00:00 via INTRAVENOUS

## 2012-04-11 MED ORDER — MONTELUKAST SODIUM 10 MG PO TABS
10.0000 mg | ORAL_TABLET | Freq: Every day | ORAL | Status: DC
  Filled 2012-04-11: qty 1

## 2012-04-11 MED ORDER — ACETAMINOPHEN 325 MG PO TABS: 650.0000 mg | ORAL_TABLET | ORAL | Status: DC | PRN

## 2012-04-11 MED ORDER — PANTOPRAZOLE SODIUM 40 MG PO TBEC
40.0000 mg | DELAYED_RELEASE_TABLET | Freq: Every day | ORAL | Status: DC
  Administered 2012-04-11: 40 mg via ORAL
  Filled 2012-04-11 (×2): qty 1

## 2012-04-11 MED ORDER — ASPIRIN 325 MG PO TABS
325.0000 mg | ORAL_TABLET | Freq: Every day | ORAL | Status: DC
  Administered 2012-04-11: 325 mg via ORAL
  Filled 2012-04-11: qty 1

## 2012-04-11 MED ORDER — TRAZODONE HCL 50 MG PO TABS
50.0000 mg | ORAL_TABLET | Freq: Every day | ORAL | Status: DC
  Administered 2012-04-12: 50 mg via ORAL
  Filled 2012-04-11: qty 1

## 2012-04-11 MED ORDER — SODIUM CHLORIDE 0.9 % IV SOLN: 1000.0000 mL | INTRAVENOUS | Status: DC

## 2012-04-11 MED ORDER — POTASSIUM CHLORIDE CRYS ER 20 MEQ PO TBCR
20.0000 meq | EXTENDED_RELEASE_TABLET | Freq: Every day | ORAL | Status: DC
  Administered 2012-04-11: 20 meq via ORAL
  Filled 2012-04-11 (×2): qty 1

## 2012-04-11 MED ORDER — SERTRALINE HCL 25 MG PO TABS
25.0000 mg | ORAL_TABLET | Freq: Every day | ORAL | Status: DC
  Filled 2012-04-11 (×2): qty 1

## 2012-04-11 NOTE — Code Documentation (Signed)
66 year old presents to ED with bilateral arm and leg weakness.  States she has felt bad all day with some headache and general maliase.  Her husband states he tried to get her to MD all day.  She finally agreed.  At 4 pm she walked to car without problem.  On arrival to ER she was weak bilaterally arms and legs.  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 Nihss 10- See flowsheet for details.  To MRI for DWI scan- Dr. Elita Boone present.  Patient tol MRI.  Back to ED - handoff to [facility].  Family updated by Dr. Elita Boone.  LSW 1600 when she walked to the car.  Arrived in Ed at 1649.  Code Stroke called at 1655. To CT scan at 1656.  Stroke team arrival at 1704.  Code stroke cancelled at 1815 - not a stroke per MRI results.

## 2012-04-11 NOTE — ED Notes (Signed)
Patient to MRI with Eunice Blase, RN rapid response.

## 2012-04-11 NOTE — ED Notes (Signed)
Patient returned from MRI.

## 2012-04-11 NOTE — ED Notes (Signed)
CDU RN is unable to take report at this time, will return call

## 2012-04-11 NOTE — ED Notes (Signed)
CDU is full, will call when a bed is available

## 2012-04-11 NOTE — Consult Note (Signed)
Reason for Consult:Response to Code Stroke Referring Physician: Dr Wentz/Dr Juleen China  CC: generalized weakness of acute onset, depression  HPI: Amy Vang is an 66 y.o. female who has felt poorly for the past 2 wks.  She was treated with z pack for sinusitis about 2 weeks ago.  Her husband decided to bring her to [facility] for evaluation.  The pt walked into the car at 1600 hours on 04/11/12.  On the way to the hospital she c/o weakness of arms and shoulders and before arriving at hospital weakness of the legs (all within a 10-15 minute window.  She was brought into the ER and a Code Stroke was called.  Her dau, a former Optometrist, and husband were at the bedsie as well as Roda Shutters, RN, the rapid response nurse covering Code Stroke. Ahx was obtained from the pt, husband and dau, the pt was examined.  The CT was negative and  A MRI DWI imaging was obtained.  Past Medical History  Diagnosis Date  . Hypertension   . History of TIAs AGE 56  AND 2008---  NO RESIDUAL  . GERD (gastroesophageal reflux disease) PT STATES SEVERE---- CONTROLLED W/ NEXIUM  . Allergic rhinitis   . Non-specific gastrointestinal complaint   . Cystocele   . SUI (stress urinary incontinence, female)   . Restless leg syndrome   . Normal echocardiogram 09-14-2007    EF 65%, NORMAL LV    Past Surgical History  Procedure Date  . Carpal tunnel release 2009    BILATERAL  . Pulley release right thumb 2009  . Vaginal hysterectomy AGE 26  . Bilateral benign breast bx's 20 YRS AGO  . Noninvasive vascular carotid study 09-14-2007    BILATERAL MILD MIX PLAQUE THROUGHOUT, NO SIGNIFICANT BILATERAL ICA STENOSIS  . Cystocele repair 12/11/2011    Procedure: ANTERIOR REPAIR (CYSTOCELE);  Surgeon: Garnett Farm, MD;  Location: [facility];  Service: Urology;  Laterality: N/A;  1 1/2 hour requested for case  Anterior repair and mid Urethral Sling  . Pubovaginal sling 12/11/2011    Procedure: Leonides Grills;  Surgeon:  Garnett Farm, MD;  Location: [facility];  Service: Urology;  Laterality: N/A;    No family history on file.  Social History:  reports that she has never smoked. She has never used smokeless tobacco. She reports that she does not drink alcohol or use illicit drugs.  Allergies  Allergen Reactions  . Penicillins Hives    Medications: Prior to Admission:  Meds reviewed and on ED chart ROS *History obtained from the patient  General ROS: negative for - chills, fatigue, fever, night sweats, weight gain or weight loss Psychological ROS: negative for - behavioral disorder, hallucinations, memory difficulties, mood swings or suicidal ideation Ophthalmic ROS: negative for - blurry vision, double vision, eye pain or loss of vision ENT ROS: negative for - epistaxis, nasal discharge, oral lesions, sore throat, tinnitus or vertigo Allergy and Immunology ROS: negative for - hives or itchy/watery eyes Hematological and Lymphatic ROS: negative for - bleeding problems, bruising or swollen lymph nodes Endocrine ROS: negative for - galactorrhea, hair pattern changes, polydipsia/polyuria or temperature intolerance Respiratory ROS: negative for - cough, hemoptysis, shortness of breath or wheezing Cardiovascular ROS: negative for - chest pain, dyspnea on exertion, edema or irregular heartbeat Gastrointestinal ROS: negative for - abdominal pain, diarrhea, hematemesis, nausea/vomiting or stool incontinence Genito-Urinary ROS: negative for - dysuria, hematuria, incontinence or urinary frequency/urgency Musculoskeletal ROS: negative for - joint  swelling or muscular weakness Neurological ROS: as noted in HPI Dermatological ROS: negative for rash and skin lesion changes * Physical Examination: Blood pressure 146/72, pulse 75, temperature 98.4 F (36.9 C), temperature source Oral, resp. rate 17, weight 78.926 kg (174 lb), SpO2 98.00%.  Neurologic Examination *Mental Status: Alert, oriented,  thought content appropriate.  Speech fluent without evidence of aphasia.  Able to follow 3 step commands without difficulty. Cranial Nerves: II: visual fields grossly normal, pupils equal, round, reactive to light. III,IV, VI: ptosis not present, extra-ocular motions intact bilaterally without nystagmus. V,VII: smile symmetric, facial light touch sensation normal bilaterally VIII: hearing normal bilaterally IX,X: gag reflex present XI: trapezius strength/neck flexion strength normal bilaterally XII: tongue strength normal  Motor: Pt withdrew both legs vigorously to plantar stimulation and then feigned flaccid weakness of her legs and arms. Tone and bulk:normal tone throughout; no atrophy noted Sensory: Light touch intact throughout, bilaterally Deep Tendon Reflexes: 1+ and symmetric throughout Plantars: Right: downgoing   Left: downgoing Cerebellar: Not examined due to pt claimed weakness  Results for orders placed during the hospital encounter of 04/11/12 (from the past 48 hour(s))  PROTIME-INR     Status: Normal   Collection Time   04/11/12  5:07 PM      Component Value Range Comment   Prothrombin Time 12.7  11.6 - 15.2 (seconds)    INR 0.93  0.00 - 1.49    APTT     Status: Normal   Collection Time   04/11/12  5:07 PM      Component Value Range Comment   aPTT 29  24 - 37 (seconds)   CBC     Status: Normal   Collection Time   04/11/12  5:07 PM      Component Value Range Comment   WBC 7.5  4.0 - 10.5 (K/uL)    RBC 4.38  3.87 - 5.11 (MIL/uL)    Hemoglobin 13.2  12.0 - 15.0 (g/dL)    HCT 96.0  45.4 - 09.8 (%)    MCV 86.1  78.0 - 100.0 (fL)    MCH 30.1  26.0 - 34.0 (pg)    MCHC 35.0  30.0 - 36.0 (g/dL)    RDW 11.9  14.7 - 82.9 (%)    Platelets 212  150 - 400 (K/uL)   DIFFERENTIAL     Status: Normal   Collection Time   04/11/12  5:07 PM      Component Value Range Comment   Neutrophils Relative 45  43 - 77 (%)    Neutro Abs 3.4  1.7 - 7.7 (K/uL)    Lymphocytes Relative 45  12  - 46 (%)    Lymphs Abs 3.3  0.7 - 4.0 (K/uL)    Monocytes Relative 9  3 - 12 (%)    Monocytes Absolute 0.7  0.1 - 1.0 (K/uL)    Eosinophils Relative 1  0 - 5 (%)    Eosinophils Absolute 0.1  0.0 - 0.7 (K/uL)    Basophils Relative 0  0 - 1 (%)    Basophils Absolute 0.0  0.0 - 0.1 (K/uL)   COMPREHENSIVE METABOLIC PANEL     Status: Abnormal   Collection Time   04/11/12  5:07 PM      Component Value Range Comment   Sodium 136  135 - 145 (mEq/L)    Potassium 3.1 (*) 3.5 - 5.1 (mEq/L)    Chloride 98  96 - 112 (mEq/L)    CO2 27  19 - 32 (mEq/L)    Glucose, Bld 94  70 - 99 (mg/dL)    BUN 12  6 - 23 (mg/dL)    Creatinine, Ser 1.61  0.50 - 1.10 (mg/dL)    Calcium 9.3  8.4 - 10.5 (mg/dL)    Total Protein 6.9  6.0 - 8.3 (g/dL)    Albumin 4.1  3.5 - 5.2 (g/dL)    AST 35  0 - 37 (U/L)    ALT 38 (*) 0 - 35 (U/L)    Alkaline Phosphatase 52  39 - 117 (U/L)    Total Bilirubin 0.4  0.3 - 1.2 (mg/dL)    GFR calc non Af Amer >90  >90 (mL/min)    GFR calc Af Amer >90  >90 (mL/min)   CK TOTAL AND CKMB     Status: Normal   Collection Time   04/11/12  5:08 PM      Component Value Range Comment   Total CK 152  7 - 177 (U/L)    CK, MB 3.1  0.3 - 4.0 (ng/mL)    Relative Index 2.0  0.0 - 2.5    TROPONIN I     Status: Normal   Collection Time   04/11/12  5:08 PM      Component Value Range Comment   Troponin I <0.30  <0.30 (ng/mL)   POCT I-STAT, CHEM 8     Status: Abnormal   Collection Time   04/11/12  5:34 PM      Component Value Range Comment   Sodium 141  135 - 145 (mEq/L)    Potassium 3.1 (*) 3.5 - 5.1 (mEq/L)    Chloride 101  96 - 112 (mEq/L)    BUN 12  6 - 23 (mg/dL)    Creatinine, Ser 0.96  0.50 - 1.10 (mg/dL)    Glucose, Bld 045 (*) 70 - 99 (mg/dL)    Calcium, Ion 4.09  1.12 - 1.32 (mmol/L)    TCO2 27  0 - 100 (mmol/L)    Hemoglobin 13.6  12.0 - 15.0 (g/dL)    HCT 81.1  91.4 - 78.2 (%)     No results found for this or any previous visit (from the past 240 hour(s)).  Ct Head Wo  Contrast  04/11/2012  *RADIOLOGY REPORT*  Clinical Data: Code stroke.  Left-sided weakness  CT HEAD WITHOUT CONTRAST  Technique:  Contiguous axial images were obtained from the base of the skull through the vertex without contrast.  Comparison: CT head 01/08/2009  Findings: Ventricle size is normal.  Negative for acute infarct. No significant chronic ischemia.  Benign cyst in the basal ganglia bilaterally are unchanged.  Negative for hemorrhage or mass. Calvarium is intact.  IMPRESSION: Negative  Critical Value/emergent results were called by telephone at the time of interpretation on 04/11/1938  at 1710 hours  to  Dr. Juleen China, who verbally acknowledged these results.  Original Report Authenticated By: Camelia Phenes, M.D.   MRI DWI normal ruling out TIA, Stroke or other cerebral vascular disease.  Assessment/Plan:  Depression/ Anxiety Possible TIA or cardiac dysrhytmia.  Plan: Discuseed with family,  04/11/2012, 6:37 PM  Past Medical History  Diagnosis Date  . Hypertension   . History of TIAs AGE 31  AND 2008---  NO RESIDUAL  . GERD (gastroesophageal reflux disease) PT STATES SEVERE---- CONTROLLED W/ NEXIUM  . Allergic rhinitis   . Non-specific gastrointestinal complaint   . Cystocele   . SUI (stress urinary incontinence, female)   .  Restless leg syndrome   . Normal echocardiogram 09-14-2007    EF 65%, NORMAL LV    Past Surgical History  Procedure Date  . Carpal tunnel release 2009    BILATERAL  . Pulley release right thumb 2009  . Vaginal hysterectomy AGE 59  . Bilateral benign breast bx's 20 YRS AGO  . Noninvasive vascular carotid study 09-14-2007    BILATERAL MILD MIX PLAQUE THROUGHOUT, NO SIGNIFICANT BILATERAL ICA STENOSIS  . Cystocele repair 12/11/2011    Procedure: ANTERIOR REPAIR (CYSTOCELE);  Surgeon: Garnett Farm, MD;  Location: [facility];  Service: Urology;  Laterality: N/A;  1 1/2 hour requested for case  Anterior repair and mid Urethral Sling  .  Pubovaginal sling 12/11/2011    Procedure: Leonides Grills;  Surgeon: Garnett Farm, MD;  Location: [facility];  Service: Urology;  Laterality: N/A;    No family history on file.  Social History:  reports that she has never smoked. She has never used smokeless tobacco. She reports that she does not drink alcohol or use illicit drugs.  Allergies  Allergen Reactions  . Penicillins Hives    Medications: I have reviewed the patient's current medications. Prior to Admission:   ROS *History obtained from the patient  General ROS: negative for - chills, fatigue, fever, night sweats, weight gain or weight loss Psychological ROS: negative for - behavioral disorder, hallucinations, memory difficulties, mood swings or suicidal ideation Ophthalmic ROS: negative for - blurry vision, double vision, eye pain or loss of vision ENT ROS: negative for - epistaxis, nasal discharge, oral lesions, sore throat, tinnitus or vertigo Allergy and Immunology ROS: negative for - hives or itchy/watery eyes Hematological and Lymphatic ROS: negative for - bleeding problems, bruising or swollen lymph nodes Endocrine ROS: negative for - galactorrhea, hair pattern changes, polydipsia/polyuria or temperature intolerance Respiratory ROS: negative for - cough, hemoptysis, shortness of breath or wheezing Cardiovascular ROS: negative for - chest pain, dyspnea on exertion, edema or irregular heartbeat Gastrointestinal ROS: negative for - abdominal pain, diarrhea, hematemesis, nausea/vomiting or stool incontinence Genito-Urinary ROS: negative for - dysuria, hematuria, incontinence or urinary frequency/urgency Musculoskeletal ROS: negative for - joint swelling or muscular weakness Neurological ROS: as noted in HPI Dermatological ROS: negative for rash and skin lesion changes * Physical Examination: Blood pressure 146/72, pulse 75, temperature 98.4 F (36.9 C), temperature source Oral, resp. rate 17,  weight 78.926 kg (174 lb), SpO2 98.00%.    Results for orders placed during the hospital encounter of 04/11/12 (from the past 48 hour(s))  PROTIME-INR     Status: Normal   Collection Time   04/11/12  5:07 PM      Component Value Range Comment   Prothrombin Time 12.7  11.6 - 15.2 (seconds)    INR 0.93  0.00 - 1.49    APTT     Status: Normal   Collection Time   04/11/12  5:07 PM      Component Value Range Comment   aPTT 29  24 - 37 (seconds)   CBC     Status: Normal   Collection Time   04/11/12  5:07 PM      Component Value Range Comment   WBC 7.5  4.0 - 10.5 (K/uL)    RBC 4.38  3.87 - 5.11 (MIL/uL)    Hemoglobin 13.2  12.0 - 15.0 (g/dL)    HCT 16.1  09.6 - 04.5 (%)    MCV 86.1  78.0 - 100.0 (fL)  MCH 30.1  26.0 - 34.0 (pg)    MCHC 35.0  30.0 - 36.0 (g/dL)    RDW 09.8  11.9 - 14.7 (%)    Platelets 212  150 - 400 (K/uL)   DIFFERENTIAL     Status: Normal   Collection Time   04/11/12  5:07 PM      Component Value Range Comment   Neutrophils Relative 45  43 - 77 (%)    Neutro Abs 3.4  1.7 - 7.7 (K/uL)    Lymphocytes Relative 45  12 - 46 (%)    Lymphs Abs 3.3  0.7 - 4.0 (K/uL)    Monocytes Relative 9  3 - 12 (%)    Monocytes Absolute 0.7  0.1 - 1.0 (K/uL)    Eosinophils Relative 1  0 - 5 (%)    Eosinophils Absolute 0.1  0.0 - 0.7 (K/uL)    Basophils Relative 0  0 - 1 (%)    Basophils Absolute 0.0  0.0 - 0.1 (K/uL)   COMPREHENSIVE METABOLIC PANEL     Status: Abnormal   Collection Time   04/11/12  5:07 PM      Component Value Range Comment   Sodium 136  135 - 145 (mEq/L)    Potassium 3.1 (*) 3.5 - 5.1 (mEq/L)    Chloride 98  96 - 112 (mEq/L)    CO2 27  19 - 32 (mEq/L)    Glucose, Bld 94  70 - 99 (mg/dL)    BUN 12  6 - 23 (mg/dL)    Creatinine, Ser 8.29  0.50 - 1.10 (mg/dL)    Calcium 9.3  8.4 - 10.5 (mg/dL)    Total Protein 6.9  6.0 - 8.3 (g/dL)    Albumin 4.1  3.5 - 5.2 (g/dL)    AST 35  0 - 37 (U/L)    ALT 38 (*) 0 - 35 (U/L)    Alkaline Phosphatase 52  39 - 117 (U/L)     Total Bilirubin 0.4  0.3 - 1.2 (mg/dL)    GFR calc non Af Amer >90  >90 (mL/min)    GFR calc Af Amer >90  >90 (mL/min)   CK TOTAL AND CKMB     Status: Normal   Collection Time   04/11/12  5:08 PM      Component Value Range Comment   Total CK 152  7 - 177 (U/L)    CK, MB 3.1  0.3 - 4.0 (ng/mL)    Relative Index 2.0  0.0 - 2.5    TROPONIN I     Status: Normal   Collection Time   04/11/12  5:08 PM      Component Value Range Comment   Troponin I <0.30  <0.30 (ng/mL)   POCT I-STAT, CHEM 8     Status: Abnormal   Collection Time   04/11/12  5:34 PM      Component Value Range Comment   Sodium 141  135 - 145 (mEq/L)    Potassium 3.1 (*) 3.5 - 5.1 (mEq/L)    Chloride 101  96 - 112 (mEq/L)    BUN 12  6 - 23 (mg/dL)    Creatinine, Ser 5.62  0.50 - 1.10 (mg/dL)    Glucose, Bld 130 (*) 70 - 99 (mg/dL)    Calcium, Ion 8.65  1.12 - 1.32 (mmol/L)    TCO2 27  0 - 100 (mmol/L)    Hemoglobin 13.6  12.0 - 15.0 (g/dL)    HCT 40.0  36.0 - 46.0 (%)     No results found for this or any previous visit (from the past 240 hour(s)).  Ct Head Wo Contrast  04/11/2012  *RADIOLOGY REPORT*  Clinical Data: Code stroke.  Left-sided weakness  CT HEAD WITHOUT CONTRAST  Technique:  Contiguous axial images were obtained from the base of the skull through the vertex without contrast.  Comparison: CT head 01/08/2009  Findings: Ventricle size is normal.  Negative for acute infarct. No significant chronic ischemia.  Benign cyst in the basal ganglia bilaterally are unchanged.  Negative for hemorrhage or mass. Calvarium is intact.  IMPRESSION: Negative  Critical Value/emergent results were called by telephone at the time of interpretation on 04/11/1938  at 1710 hours  to  Dr. Juleen China, who verbally acknowledged these results.  Original Report Authenticated By: Camelia Phenes, M.D.     Assessment/Plan:  Patient Active Hospital Problem List: No active hospital problems.     HPI: Amy Vang is an 66 y.o. female    Past Medical History  Diagnosis Date  . Hypertension   . History of TIAs AGE 75  AND 2008---  NO RESIDUAL  . GERD (gastroesophageal reflux disease) PT STATES SEVERE---- CONTROLLED W/ NEXIUM  . Allergic rhinitis   . Non-specific gastrointestinal complaint   . Cystocele   . SUI (stress urinary incontinence, female)   . Restless leg syndrome   . Normal echocardiogram 09-14-2007    EF 65%, NORMAL LV    Past Surgical History  Procedure Date  . Carpal tunnel release 2009    BILATERAL  . Pulley release right thumb 2009  . Vaginal hysterectomy AGE 67  . Bilateral benign breast bx's 20 YRS AGO  . Noninvasive vascular carotid study 09-14-2007    BILATERAL MILD MIX PLAQUE THROUGHOUT, NO SIGNIFICANT BILATERAL ICA STENOSIS  . Cystocele repair 12/11/2011    Procedure: ANTERIOR REPAIR (CYSTOCELE);  Surgeon: Garnett Farm, MD;  Location: [facility];  Service: Urology;  Laterality: N/A;  1 1/2 hour requested for case  Anterior repair and mid Urethral Sling  . Pubovaginal sling 12/11/2011    Procedure: Leonides Grills;  Surgeon: Garnett Farm, MD;  Location: [facility];  Service: Urology;  Laterality: N/A;    No family history on file.  Social History:  reports that she has never smoked. She has never used smokeless tobacco. She reports that she does not drink alcohol or use illicit drugs.  Allergies  Allergen Reactions  . Penicillins Hives    Medications: I have reviewed the patient's current medications. Prior to Admission:  (Not in a hospital admission)  ROS *History obtained from the patient  General ROS: negative for - chills, fatigue, fever, night sweats, weight gain or weight loss Psychological ROS: negative for - behavioral disorder, hallucinations, memory difficulties, mood swings or suicidal ideation Ophthalmic ROS: negative for - blurry vision, double vision, eye pain or loss of vision ENT ROS: negative for - epistaxis, nasal  discharge, oral lesions, sore throat, tinnitus or vertigo Allergy and Immunology ROS: negative for - hives or itchy/watery eyes Hematological and Lymphatic ROS: negative for - bleeding problems, bruising or swollen lymph nodes Endocrine ROS: negative for - galactorrhea, hair pattern changes, polydipsia/polyuria or temperature intolerance Respiratory ROS: negative for - cough, hemoptysis, shortness of breath or wheezing Cardiovascular ROS: negative for - chest pain, dyspnea on exertion, edema or irregular heartbeat Gastrointestinal ROS: negative for - abdominal pain, diarrhea, hematemesis, nausea/vomiting or stool incontinence Genito-Urinary ROS: negative for -  dysuria, hematuria, incontinence or urinary frequency/urgency Musculoskeletal ROS: negative for - joint swelling or muscular weakness Neurological ROS: as noted in HPI Dermatological ROS: negative for rash and skin lesion changes * Physical Examination: Blood pressure 146/72, pulse 75, temperature 98.4 F (36.9 C), temperature source Oral, resp. rate 17, weight 78.926 kg (174 lb), SpO2 98.00%.  Neurologic Examination  Results for orders placed during the hospital encounter of 04/11/12 (from the past 48 hour(s))  PROTIME-INR     Status: Normal   Collection Time   04/11/12  5:07 PM      Component Value Range Comment   Prothrombin Time 12.7  11.6 - 15.2 (seconds)    INR 0.93  0.00 - 1.49    APTT     Status: Normal   Collection Time   04/11/12  5:07 PM      Component Value Range Comment   aPTT 29  24 - 37 (seconds)   CBC     Status: Normal   Collection Time   04/11/12  5:07 PM      Component Value Range Comment   WBC 7.5  4.0 - 10.5 (K/uL)    RBC 4.38  3.87 - 5.11 (MIL/uL)    Hemoglobin 13.2  12.0 - 15.0 (g/dL)    HCT 16.1  09.6 - 04.5 (%)    MCV 86.1  78.0 - 100.0 (fL)    MCH 30.1  26.0 - 34.0 (pg)    MCHC 35.0  30.0 - 36.0 (g/dL)    RDW 40.9  81.1 - 91.4 (%)    Platelets 212  150 - 400 (K/uL)   DIFFERENTIAL     Status:  Normal   Collection Time   04/11/12  5:07 PM      Component Value Range Comment   Neutrophils Relative 45  43 - 77 (%)    Neutro Abs 3.4  1.7 - 7.7 (K/uL)    Lymphocytes Relative 45  12 - 46 (%)    Lymphs Abs 3.3  0.7 - 4.0 (K/uL)    Monocytes Relative 9  3 - 12 (%)    Monocytes Absolute 0.7  0.1 - 1.0 (K/uL)    Eosinophils Relative 1  0 - 5 (%)    Eosinophils Absolute 0.1  0.0 - 0.7 (K/uL)    Basophils Relative 0  0 - 1 (%)    Basophils Absolute 0.0  0.0 - 0.1 (K/uL)   COMPREHENSIVE METABOLIC PANEL     Status: Abnormal   Collection Time   04/11/12  5:07 PM      Component Value Range Comment   Sodium 136  135 - 145 (mEq/L)    Potassium 3.1 (*) 3.5 - 5.1 (mEq/L)    Chloride 98  96 - 112 (mEq/L)    CO2 27  19 - 32 (mEq/L)    Glucose, Bld 94  70 - 99 (mg/dL)    BUN 12  6 - 23 (mg/dL)    Creatinine, Ser 7.82  0.50 - 1.10 (mg/dL)    Calcium 9.3  8.4 - 10.5 (mg/dL)    Total Protein 6.9  6.0 - 8.3 (g/dL)    Albumin 4.1  3.5 - 5.2 (g/dL)    AST 35  0 - 37 (U/L)    ALT 38 (*) 0 - 35 (U/L)    Alkaline Phosphatase 52  39 - 117 (U/L)    Total Bilirubin 0.4  0.3 - 1.2 (mg/dL)    GFR calc non Af Amer >90  >90 (  mL/min)    GFR calc Af Amer >90  >90 (mL/min)   CK TOTAL AND CKMB     Status: Normal   Collection Time   04/11/12  5:08 PM      Component Value Range Comment   Total CK 152  7 - 177 (U/L)    CK, MB 3.1  0.3 - 4.0 (ng/mL)    Relative Index 2.0  0.0 - 2.5    TROPONIN I     Status: Normal   Collection Time   04/11/12  5:08 PM      Component Value Range Comment   Troponin I <0.30  <0.30 (ng/mL)   POCT I-STAT, CHEM 8     Status: Abnormal   Collection Time   04/11/12  5:34 PM      Component Value Range Comment   Sodium 141  135 - 145 (mEq/L)    Potassium 3.1 (*) 3.5 - 5.1 (mEq/L)    Chloride 101  96 - 112 (mEq/L)    BUN 12  6 - 23 (mg/dL)    Creatinine, Ser 4.09  0.50 - 1.10 (mg/dL)    Glucose, Bld 811 (*) 70 - 99 (mg/dL)    Calcium, Ion 9.14  1.12 - 1.32 (mmol/L)    TCO2 27  0  - 100 (mmol/L)    Hemoglobin 13.6  12.0 - 15.0 (g/dL)    HCT 78.2  95.6 - 21.3 (%)     No results found for this or any previous visit (from the past 240 hour(s)).  Ct Head Wo Contrast  04/11/2012  *RADIOLOGY REPORT*  Clinical Data: Code stroke.  Left-sided weakness  CT HEAD WITHOUT CONTRAST  Technique:  Contiguous axial images were obtained from the base of the skull through the vertex without contrast.  Comparison: CT head 01/08/2009  Findings: Ventricle size is normal.  Negative for acute infarct. No significant chronic ischemia.  Benign cyst in the basal ganglia bilaterally are unchanged.  Negative for hemorrhage or mass. Calvarium is intact.  IMPRESSION: Negative  Critical Value/emergent results were called by telephone at the time of interpretation on 04/11/1938  at 1710 hours  to  Dr. Juleen China, who verbally acknowledged these results.  Original Report Authenticated By: Camelia Phenes, M.D.     Assessment/Plan:  Depression/Anxiety Possible low cerebral flow secondary to cardiac dysrhythmia  Plan.  After discuss with family, Dr Effie Shy and Nurse Moshe Cipro, I canceled the Code Stroke, and had the pt observed in the ED for a TIA. I recommended traodone 50 mg po qd x 2 days and then 100 mg po qhs.   04/11/2012, 6:38 PM       04/11/2012, 6:37 PM

## 2012-04-11 NOTE — ED Provider Notes (Signed)
Pt assessed in triage. Stroke-like symptoms. Onset around 1530 today. On exam pt leaning to L side and cannot sit upright without assistance. Globally weak but L U/L extremities worse than right. CN 2-12 intact. Could not assess cerebellar function 2/2 weakness. Speech clear and content appropriate. Pt reports hx of previous TIA with similar symptoms and followed-up by Dr Pearlean Brownie. Takes 81mg  ASA daily. Stroke alert called and w/u initiated.  Raeford Razor, MD 04/11/12 [phone]

## 2012-04-11 NOTE — ED Provider Notes (Signed)
History     CSN: [id number]  Arrival date & time 04/11/12  1624   First MD Initiated Contact with Patient 04/11/12 1712      Chief Complaint  Patient presents with  . Code Stroke    (Consider location/radiation/quality/duration/timing/severity/associated sxs/prior treatment) HPI Comments: Amy Vang is a 66 y.o. Female who was at home today when she felt heaviness of both arms, left greater than right. She also had difficulty walking due to to both legs being weak. She has intermittent headache for several days. She felt like this previously when she had a TIA. The TIA was in 2008, she was evaluated by neurology at that time, treated,  and entered into a blinded study with medication. She ultimately stopped the medication because of muscle aches. She sees the neurologist annually. She has no recent illness. She denies chest pain, cough, shortness of breath, nausea, vomiting, change in bowel or urinary habits. She has had insomnia for several months.  The history is provided by the patient.    Past Medical History  Diagnosis Date  . Hypertension   . History of TIAs AGE 66  AND 2008---  NO RESIDUAL  . GERD (gastroesophageal reflux disease) PT STATES SEVERE---- CONTROLLED W/ NEXIUM  . Allergic rhinitis   . Non-specific gastrointestinal complaint   . Cystocele   . SUI (stress urinary incontinence, female)   . Restless leg syndrome   . Normal echocardiogram 09-14-2007    EF 65%, NORMAL LV    Past Surgical History  Procedure Date  . Carpal tunnel release 2009    BILATERAL  . Pulley release right thumb 2009  . Vaginal hysterectomy AGE 57  . Bilateral benign breast bx's 20 YRS AGO  . Noninvasive vascular carotid study 09-14-2007    BILATERAL MILD MIX PLAQUE THROUGHOUT, NO SIGNIFICANT BILATERAL ICA STENOSIS  . Cystocele repair 12/11/2011    Procedure: ANTERIOR REPAIR (CYSTOCELE);  Surgeon: Garnett Farm, MD;  Location[facility];  Service: Urology;   Laterality: N/A;  1 1/2 hour requested for case  Anterior repair and mid Urethral Sling  . Pubovaginal sling 12/11/2011    Procedure: Leonides Grills;  Surgeon: Garnett Farm, MD;  Location: [facility];  Service: Urology;  Laterality: N/A;    No family history on file.  History  Substance Use Topics  . Smoking status: Never Smoker   . Smokeless tobacco: Never Used  . Alcohol Use: No    OB History    Grav Para Term Preterm Abortions TAB SAB Ect Mult Living                  Review of Systems  All other systems reviewed and are negative.    Allergies  Penicillins  Home Medications   Current Outpatient Rx  Name Route Sig Dispense Refill  . ASPIRIN 81 MG PO TABS Oral Take 160 mg by mouth daily.    . B COMPLEX PO TABS Oral Take 1 tablet by mouth daily.    . ESTER C PO Oral Take 1 tablet by mouth daily.    Marland Kitchen CALCIUM PLUS VITAMIN D PO Oral Take 1 tablet by mouth daily.    Marland Kitchen ESOMEPRAZOLE MAGNESIUM 40 MG PO CPDR Oral Take 40 mg by mouth 2 (two) times daily.    . OMEGA-3 FATTY ACIDS 1000 MG PO CAPS Oral Take 2 g by mouth daily.    Marland Kitchen HYDROCHLOROTHIAZIDE 25 MG PO TABS Oral Take 25 mg by mouth daily.    Marland Kitchen  MONTELUKAST SODIUM 10 MG PO TABS Oral Take 10 mg by mouth at bedtime.    Marland Kitchen POLYETHYLENE GLYCOL 3350 PO PACK Oral Take 17 g by mouth daily.    Marland Kitchen POTASSIUM CHLORIDE CRYS ER 20 MEQ PO TBCR Oral Take 20 mEq by mouth daily.    Marland Kitchen ALIGN PO Oral Take 1 capsule by mouth every morning.    Marland Kitchen ROPINIROLE HCL 2 MG PO TABS Oral Take 2 mg by mouth at bedtime.    . SERTRALINE HCL 25 MG PO TABS Oral Take 25 mg by mouth daily.      BP 141/55  Pulse 70  Temp(Src) 97.9 F (36.6 C) (Oral)  Resp 17  Wt 174 lb (78.926 kg)  SpO2 98%  Physical Exam  Nursing note and vitals reviewed. Constitutional: She is oriented to person, place, and time. She appears well-developed and well-nourished.  HENT:  Head: Normocephalic and atraumatic.  Eyes: Conjunctivae and EOM are normal.  Pupils are equal, round, and reactive to light.  Neck: Normal range of motion and phonation normal. Neck supple.  Cardiovascular: Normal rate, regular rhythm and intact distal pulses.   Pulmonary/Chest: Effort normal and breath sounds normal. She exhibits no tenderness.  Abdominal: Soft. She exhibits no distension. There is no tenderness. There is no guarding.  Musculoskeletal: Normal range of motion.  Neurological: She is alert and oriented to person, place, and time. She has normal strength. She exhibits normal muscle tone.       She can elevate both legs off the stretcher, but has difficulty in the above. 20, or holding unsteady at this position. The strength seems to wax and wane. The patient states her strength is improving, at 20:20.  Skin: Skin is warm and dry.  Psychiatric: Her behavior is normal. Judgment and thought content normal.       She appears anxious    ED Course  Procedures (including critical care time)  The patient presented as a code stroke. She was evaluated initially by neurology who ordered additional testing. An MRI was done. Dr. Elita Boone, did not diagnose , TIA, but advised that the patient stay overnight for carotid Dopplers in the morning. He also thought she would benefit from trazodone 50 mg each bedtime x2, then 100 mg each bedtime to help her insomnia.      Labs Reviewed  COMPREHENSIVE METABOLIC PANEL - Abnormal; Notable for the following:    Potassium 3.1 (*)    ALT 38 (*)    All other components within normal limits  POCT I-STAT, CHEM 8 - Abnormal; Notable for the following:    Potassium 3.1 (*)    Glucose, Bld 100 (*)    All other components within normal limits  PROTIME-INR  APTT  CBC  DIFFERENTIAL  CK TOTAL AND CKMB  TROPONIN I  URINE RAPID DRUG SCREEN (HOSP PERFORMED)   Ct Head Wo Contrast  04/11/2012  *RADIOLOGY REPORT*  Clinical Data: Code stroke.  Left-sided weakness  CT HEAD WITHOUT CONTRAST  Technique:  Contiguous axial images were  obtained from the base of the skull through the vertex without contrast.  Comparison: CT head 01/08/2009  Findings: Ventricle size is normal.  Negative for acute infarct. No significant chronic ischemia.  Benign cyst in the basal ganglia bilaterally are unchanged.  Negative for hemorrhage or mass. Calvarium is intact.  IMPRESSION: Negative  Critical Value/emergent results were called by telephone at the time of interpretation on 04/11/1938  at 1710 hours  to  Dr. Juleen China, who verbally  acknowledged these results.  Original Report Authenticated By: Camelia Phenes, M.D.   Mr Brain Wo Contrast  04/11/2012  *RADIOLOGY REPORT*  Clinical Data: Unable to move upper extremities and lower extremities. Stroke-like symptoms.  MRI HEAD WITHOUT CONTRAST  Technique:  Multiplanar, multiecho pulse sequences of the brain and surrounding structures were obtained according to standard protocol without intravenous contrast.  Comparison: CT head earlier in the day.  Findings: There is no evidence for acute infarction, intracranial hemorrhage, mass lesion, hydrocephalus, or extra-axial fluid.  No significant atrophy. Minor subcortical white matter signal changes, nonspecific.  No foci of chronic hemorrhage.  Major intracranial vascular structures patent.  Prominent perivascular spaces.  No acute sinus or mastoid disease.  Negative orbits.  Normal pituitary and cerebellar tonsils.  No osseous destructive lesions.  IMPRESSION: No acute or focal intracranial abnormalities. Minor subcortical white matter signal changes, nonspecific.  Original Report Authenticated By: Elsie Stain, M.D.     1. Weakness       MDM  Nonspecific weakness, with out. Clear evidence for TIA, CVA, metabolic instability or suspected occult infection. Patient will be observed overnight on the TIA protocol. Trazodone for insomnia. The patient can likely be discharged home to followup with her regular doctor tomorrow morning.    Patient placed in  observation on TIA protocol.    Flint Melter, MD 04/12/12 [phone]

## 2012-04-11 NOTE — ED Notes (Addendum)
Reports both arms have gotten heavy, L worse that R; reports chest heaviness; reports headaches; reports symptoms started about 16:00; reports hx of tia with similar symptoms; pt leaning toward left; family holding head up d/t leaning toward L; pt reports blurred vision, and light headedness; pt with drift in bilateral arms; EDP brought to bedside immediately to assess pt; face symmetrical; pt a&oX4

## 2012-04-11 NOTE — Consult Note (Signed)
Reason for Consult:Response to Code Stroke Referring Physician: Dr Effie Shy  CC: sudden onset of extreme extremity weakness, possible stroke  HPI: Amy Vang is an 66 y.o. female who retired 6 months ago.  She had a supposed TIA in 2008 and was admitted to the stroke service under Dr Pearlean Brownie.  She has felt poorly for the last 2 weeks with non-specific complaints.  Her husband decided on his own to bring her to the [facility] for evaluation.  The pt walked to the car and got in without help at 1600 hrs on 04/11/2012 according to her husband who was the driver.  During the trip to the hospital the pt claimmed she couldn't move her shoulders and by the time they reached the ED she claimed not to be able to move her legs and a Code Stroke was called.  I responded.  The CT scan was normal.  Roda Shutters, RN was the rapid response nurse at the bedside and the pt's daughter and husband were present being joined later by the pt's son.  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  The pt moved her feet and legs to noxious stimuli of her plantar area but had flaccid weakness of the arms and legs with normal sensation and normal CN.  A MRI DWI was obtained and was normal ruling out a TIA or stroke.  Past Medical History  Diagnosis Date  . Hypertension   . History of TIAs AGE 20  AND 2008---  NO RESIDUAL  . GERD (gastroesophageal reflux disease) PT STATES SEVERE---- CONTROLLED W/ NEXIUM  . Allergic rhinitis   . Non-specific gastrointestinal complaint   . Cystocele   . SUI (stress urinary incontinence, female)   . Restless leg syndrome   . Normal echocardiogram 09-14-2007    EF 65%, NORMAL LV    Past Surgical History  Procedure Date  . Carpal tunnel release 2009    BILATERAL  . Pulley release right thumb 2009  . Vaginal hysterectomy AGE 71  . Bilateral benign breast bx's 20 YRS AGO  . Noninvasive vascular carotid study 09-14-2007    BILATERAL MILD MIX PLAQUE THROUGHOUT, NO SIGNIFICANT BILATERAL  ICA STENOSIS  . Cystocele repair 12/11/2011    Procedure: ANTERIOR REPAIR (CYSTOCELE);  Surgeon: Garnett Farm, MD;  Location: [facility];  Service: Urology;  Laterality: N/A;  1 1/2 hour requested for case  Anterior repair and mid Urethral Sling  . Pubovaginal sling 12/11/2011    Procedure: Leonides Grills;  Surgeon: Garnett Farm, MD;  Location: [facility];  Service: Urology;  Laterality: N/A;    No family history on file.  Social History:  reports that she has never smoked. She has never used smokeless tobacco. She reports that she does not drink alcohol or use illicit drugs.  Allergies  Allergen Reactions  . Penicillins Hives    Medications: Prior to Admission: Zolfoft, aspirin, singular,Hctz and other meds, listed in the ED notes ROS *History obtained from chart review and the patient  General ROS: negative for - chills, fatigue, fever, night sweats, weight gain or weight loss Psychological ROS: negative for - behavioral disorder, hallucinations, memory difficulties, mood swings or suicidal ideation Ophthalmic ROS: negative for - blurry vision, double vision, eye pain or loss of vision ENT ROS: negative for - epistaxis, nasal discharge, oral lesions, sore throat, tinnitus or vertigo Allergy and Immunology ROS: negative for - hives or itchy/watery eyes Hematological and Lymphatic ROS:  negative for - bleeding problems, bruising or swollen lymph nodes Endocrine ROS: negative for - galactorrhea, hair pattern changes, polydipsia/polyuria or temperature intolerance Respiratory ROS: negative for - cough, hemoptysis, shortness of breath or wheezing Cardiovascular ROS: negative for - chest pain, dyspnea on exertion, edema or irregular heartbeat Gastrointestinal ROS: negative for - abdominal pain, diarrhea, hematemesis, nausea/vomiting or stool incontinence Genito-Urinary ROS: negative for - dysuria, hematuria, incontinence or urinary  frequency/urgency Musculoskeletal ROS: negative for - joint swelling or muscular weakness Neurological ROS: as noted in HPI Dermatological ROS: negative for rash and skin lesion changes * Physical Examination: Blood pressure 152/74, pulse 75, temperature 98.1 F (36.7 C), temperature source Oral, resp. rate 18, weight 78.926 kg (174 lb), SpO2 98.00%.  Neurologic Examination Mental Status: Alert, oriented, thought content appropriate.  Speech fluent without evidence of aphasia.  Able to follow 3 step commands without difficulty. Cranial Nerves: II: visual fields grossly normal, pupils equal, round, reactive to light and accommodation III,IV, VI: ptosis not present, extra-ocular motions intact bilaterally V,VII: smile symmetric, facial light touch sensation normal bilaterally VIII: hearing normal bilaterally IX,X: gag reflex present XI: trapezius strength/neck flexion strength normal bilaterally XII: tongue strength normal  Motor: Right :   Upper extremity   5/5    Left:                                                                                                                                                                                                                                                                   Lower extremity   5/5  Upper extremity 5/5                                                  Lower extremity   5/5 Tone and bulk:normal tone throughout; no atrophy noted Sensory: Lght touch intact throughout, bilaterally Deep Tendon Reflexes: 1+ and symmetric throughout Plantars: Right: downgoing   Left: downgoing Cerebellar: normal gait and station, not tested    Results for orders placed during the hospital encounter of 04/11/12 (from the past 48 hour(s))  PROTIME-INR     Status: Normal  Collection Time   04/11/12  5:07 PM      Component Value Range Comment   Prothrombin Time 12.7  11.6 - 15.2 (seconds)    INR 0.93  0.00 - 1.49    APTT     Status: Normal    Collection Time   04/11/12  5:07 PM      Component Value Range Comment   aPTT 29  24 - 37 (seconds)   CBC     Status: Normal   Collection Time   04/11/12  5:07 PM      Component Value Range Comment   WBC 7.5  4.0 - 10.5 (K/uL)    RBC 4.38  3.87 - 5.11 (MIL/uL)    Hemoglobin 13.2  12.0 - 15.0 (g/dL)    HCT 09.8  11.9 - 14.7 (%)    MCV 86.1  78.0 - 100.0 (fL)    MCH 30.1  26.0 - 34.0 (pg)    MCHC 35.0  30.0 - 36.0 (g/dL)    RDW 82.9  56.2 - 13.0 (%)    Platelets 212  150 - 400 (K/uL)   DIFFERENTIAL     Status: Normal   Collection Time   04/11/12  5:07 PM      Component Value Range Comment   Neutrophils Relative 45  43 - 77 (%)    Neutro Abs 3.4  1.7 - 7.7 (K/uL)    Lymphocytes Relative 45  12 - 46 (%)    Lymphs Abs 3.3  0.7 - 4.0 (K/uL)    Monocytes Relative 9  3 - 12 (%)    Monocytes Absolute 0.7  0.1 - 1.0 (K/uL)    Eosinophils Relative 1  0 - 5 (%)    Eosinophils Absolute 0.1  0.0 - 0.7 (K/uL)    Basophils Relative 0  0 - 1 (%)    Basophils Absolute 0.0  0.0 - 0.1 (K/uL)   COMPREHENSIVE METABOLIC PANEL     Status: Abnormal   Collection Time   04/11/12  5:07 PM      Component Value Range Comment   Sodium 136  135 - 145 (mEq/L)    Potassium 3.1 (*) 3.5 - 5.1 (mEq/L)    Chloride 98  96 - 112 (mEq/L)    CO2 27  19 - 32 (mEq/L)    Glucose, Bld 94  70 - 99 (mg/dL)    BUN 12  6 - 23 (mg/dL)    Creatinine, Ser 8.65  0.50 - 1.10 (mg/dL)    Calcium 9.3  8.4 - 10.5 (mg/dL)    Total Protein 6.9  6.0 - 8.3 (g/dL)    Albumin 4.1  3.5 - 5.2 (g/dL)    AST 35  0 - 37 (U/L)    ALT 38 (*) 0 - 35 (U/L)    Alkaline Phosphatase 52  39 - 117 (U/L)    Total Bilirubin 0.4  0.3 - 1.2 (mg/dL)    GFR calc non Af Amer >90  >90 (mL/min)    GFR calc Af Amer >90  >90 (mL/min)   CK TOTAL AND CKMB     Status: Normal   Collection Time   04/11/12  5:08 PM      Component Value Range Comment   Total CK 152  7 - 177 (U/L)    CK, MB 3.1  0.3 - 4.0 (ng/mL)    Relative Index 2.0  0.0 - 2.5    TROPONIN I      Status: Normal  Collection Time   04/11/12  5:08 PM      Component Value Range Comment   Troponin I <0.30  <0.30 (ng/mL)   POCT I-STAT, CHEM 8     Status: Abnormal   Collection Time   04/11/12  5:34 PM      Component Value Range Comment   Sodium 141  135 - 145 (mEq/L)    Potassium 3.1 (*) 3.5 - 5.1 (mEq/L)    Chloride 101  96 - 112 (mEq/L)    BUN 12  6 - 23 (mg/dL)    Creatinine, Ser 1.61  0.50 - 1.10 (mg/dL)    Glucose, Bld 096 (*) 70 - 99 (mg/dL)    Calcium, Ion 0.45  1.12 - 1.32 (mmol/L)    TCO2 27  0 - 100 (mmol/L)    Hemoglobin 13.6  12.0 - 15.0 (g/dL)    HCT 40.9  81.1 - 91.4 (%)     No results found for this or any previous visit (from the past 240 hour(s)).  Ct Head Wo Contrast  04/11/2012  *RADIOLOGY REPORT*  Clinical Data: Code stroke.  Left-sided weakness  CT HEAD WITHOUT CONTRAST  Technique:  Contiguous axial images were obtained from the base of the skull through the vertex without contrast.  Comparison: CT head 01/08/2009  Findings: Ventricle size is normal.  Negative for acute infarct. No significant chronic ischemia.  Benign cyst in the basal ganglia bilaterally are unchanged.  Negative for hemorrhage or mass. Calvarium is intact.  IMPRESSION: Negative  Critical Value/emergent results were called by telephone at the time of interpretation on 04/11/1938  at 1710 hours  to  Dr. Juleen China, who verbally acknowledged these results.  Original Report Authenticated By: Camelia Phenes, M.D.     Assessment/Plan:  Doubtful cerebral vascular event with normal MRI DWI Anxiety/depression Possible conversion reaction  Plan: The above possibilities were discussed with the family.  It was decided to observe the pt in the ED and do a TIA work up without repeating the MRI.  I suggested to Dr Effie Shy and shared with the family we'd start trazodone 50 mg po qhs x 2 then 100 mg po qhs.  04/11/2012, 7:24 PM

## 2012-04-11 NOTE — ED Notes (Signed)
Patient still in MRI.  

## 2012-04-11 NOTE — ED Notes (Signed)
Patient does have an appointment to have her esophagus stretched at the end of June.  Patient does have difficulty swallowing thick breads that stick to the back of her throat. Patient had no problem with other foods or drink.  Patient correctly demonstrated swallowing technique during her swallow screen.

## 2012-04-12 DIAGNOSIS — I517 Cardiomegaly: Secondary | ICD-10-CM

## 2012-04-12 DIAGNOSIS — G459 Transient cerebral ischemic attack, unspecified: Secondary | ICD-10-CM

## 2012-04-12 DIAGNOSIS — F341 Dysthymic disorder: Secondary | ICD-10-CM

## 2012-04-12 LAB — CARDIAC PANEL(CRET KIN+CKTOT+MB+TROPI)
CK, MB: 2.6 ng/mL (ref 0.3–4.0)
Total CK: 105 U/L (ref 7–177)

## 2012-04-12 LAB — RAPID URINE DRUG SCREEN, HOSP PERFORMED
Amphetamines: NOT DETECTED
Barbiturates: NOT DETECTED
Benzodiazepines: POSITIVE — AB
Cocaine: NOT DETECTED
Opiates: NOT DETECTED
Tetrahydrocannabinol: NOT DETECTED

## 2012-04-12 LAB — LIPID PANEL
LDL Cholesterol: 75 mg/dL (ref 0–99)
Triglycerides: 192 mg/dL — ABNORMAL HIGH (ref <150)

## 2012-04-12 LAB — HEMOGLOBIN A1C: Hgb A1c MFr Bld: 6 % — ABNORMAL HIGH (ref <5.7)

## 2012-04-12 MED ORDER — TRAZODONE HCL 50 MG PO TABS: ORAL_TABLET | ORAL | Status: DC

## 2012-04-12 NOTE — ED Notes (Signed)
Pt resting in stretcher in NAD, respirations even and unlabored. 

## 2012-04-12 NOTE — ED Notes (Signed)
Pt aware that we are waiting for medicine from pharmacy.

## 2012-04-12 NOTE — ED Provider Notes (Signed)
Patient was placed on CDU TIA protocol by Dr. Effie Shy with both Dr Effie Shy and neurology consult notes reviewed. Patient remained in CDU over night with pending carotid doppler and ECHO. Per Dr. Elita Boone, patient will be started on trazadone at home and be given referral for close PCP follow up. Patient is lying comfortably in bed with no neuro focal findings.   Lenon Oms [place], [place] 04/12/12 [phone]

## 2012-04-12 NOTE — ED Provider Notes (Signed)
Medical screening examination/treatment/procedure(s) were performed by non-physician practitioner and as supervising physician I was immediately available for consultation/collaboration.  Sunnie Nielsen, MD 04/12/12 [phone]

## 2012-04-12 NOTE — ED Notes (Signed)
Pt resting in stretcher.  Pt NSR on monitor.

## 2012-04-12 NOTE — Progress Notes (Signed)
VASCULAR LAB PRELIMINARY  PRELIMINARY  PRELIMINARY  PRELIMINARY  Carotid duplex  completed.    Preliminary report:  Bilateral:  No evidence of hemodynamically significant internal carotid artery stenosis.   Vertebral artery flow is antegrade.      Terance Hart, RVT 04/12/2012, 9:23 AM

## 2012-04-12 NOTE — Discharge Instructions (Signed)
Take 1 tablet of trazodone at bedtime tonight and then you may move to 2 tablets at bedtime to help with sleep. It is very important to followup with your primary care provider in the near future to further discuss your triglycerides which are mildly elevated. It is also important to followup with primary care provider to further discuss your history of recurrent TIA however return to emergency department at any time for emergent changing or worsening of symptoms.  Transient Ischemic Attack A transient ischemic attack (TIA) is a "warning stroke" that causes stroke-like symptoms. Unlike a stroke, a TIA does not cause permanent damage to the brain. The symptoms of a TIA can happen very fast and do not last long. It is important to know the symptoms of a TIA and what to do. This can help prevent a major stroke or death. CAUSES   A TIA is caused by a temporary blockage in an artery in the brain or neck (carotid artery). The blockage does not allow the brain to get the blood supply it needs and can cause different symptoms. The blockage can be caused by either:   A blood clot.   Fatty buildup (plaque) in a neck or brain artery.  SYMPTOMS  TIA symptoms are the same as a stroke but are temporary. Symptoms can include sudden:  Numbness or weakness on one side of the body. Especially to the:   Face.   Arm.   Leg.   Trouble speaking, thinking, or confusion.   Change in vision, such as trouble seeing in one or both eyes.   Dizziness, loss of balance, or difficulty walking.   Severe headache.  ANY OF THESE SYMPTOMS MAY REPRESENT A SERIOUS PROBLEM THAT IS AN EMERGENCY. Do not wait to see if the symptoms will go away. Get medical help at once. Call your local emergency services (911 in [place] IMMEDIATELY. DO NOT drive yourself to the hospital. RISK FACTORS Risk factors can increase the risk of developing a TIA. These can include.   High blood pressure (hypertension).   High cholesterol  (hyperlipidemia).   Heart disease (atherosclerosis).   Smoking.   Diabetes.   Abnormal heart rhythm (atrial fibrillation).   Family history of a stroke or heart attack.   Use of oral contraceptives (especially when combined with smoking).  DIAGNOSIS   A TIA can be diagnosed based on your:   Symptoms.   History.   Risk factors.   Tests that can help diagnose the symptoms of a TIA include:   CT or MRI scan. These tests can provide detailed images of the brain.   Carotid ultrasound. This test looks to see if there are blockages in the carotid arteries of your neck.   Arteriography. A thin, small flexible tube (catheter) is inserted through a small cut (incision) in your groin. The catheter is threaded to your carotid or vertebral artery. A dye is then injected into the catheter. The dye highlights the arteries in your brain and allows your caregiver to look for narrowing or blockages that can cause a TIA.  TREATMENT  Based on the cause of a TIA, treatment options can vary. Treatment is important to help prevent a stroke. Treatment options can include:  Medication. Such as:   Clot-busting medicine.   Anti-platelet medicine.   Blood pressure medicine.   Blood thinner medicine.   Surgery:   Carotid endarterectomy. The carotid arteries are the arteries that supply the head and neck with oxygenated blood. This surgery can help remove  fatty deposits (plaque) in the carotid arteries.   Angioplasty and stenting. This surgery uses a balloon to dilate a blocked artery in the brain. A stent is a small, metal mesh tube that can help keep an artery open  HOME CARE INSTRUCTIONS   It is important to take all medicine as told by your caregiver. If the medicine has side effects that affect you negatively, tell your caregiver right away. Do not stop taking medicine unless told by your caregiver. Some medicines may need to be changed to better treat your condition.   Do not smoke. Talk  to your caregiver on how to quit smoking.   Eat a diet high in fruits, vegetables and lean meat. Avoid a high fat, high salt diet. A dietician can you help you make healthy food choices.   Maintain a healthy weight. Develop an exercise plan approved by your caregiver.  SEEK IMMEDIATE MEDICAL CARE IF:   You develop weakness or numbness on one side of your body.   You have problems thinking, speaking, or feel confused.   You have vision changes.   You feel dizzy, have trouble walking, or lose your balance.   You develop a severe headache.  MAKE SURE YOU:   Understand these instructions.   Will watch your condition.   Will get help right away if you are not doing well or get worse.  Document Released: 08/26/2005 Document Revised: 11/05/2011 Document Reviewed: 01/09/2010 [facility] Patient Information 2012 [place], [place].

## 2012-04-12 NOTE — ED Notes (Signed)
Pt would like to sleep through night.  Pt advised to press nurse button if she needs anything at all.  Pt aware of plan for overnight stay and carotid study in the morning.

## 2012-05-25 ENCOUNTER — Other Ambulatory Visit: Payer: Self-pay | Admitting: Gastroenterology

## 2012-10-17 ENCOUNTER — Other Ambulatory Visit: Payer: Self-pay | Admitting: Family Medicine

## 2012-10-17 DIAGNOSIS — Z1231 Encounter for screening mammogram for malignant neoplasm of breast: Secondary | ICD-10-CM

## 2012-11-25 ENCOUNTER — Ambulatory Visit
Admission: RE | Admit: 2012-11-25 | Discharge: 2012-11-25 | Disposition: A | Discharge status: Home / Self Care | Payer: Medicare Other | Source: Ambulatory Visit | Attending: Family Medicine | Admitting: Family Medicine

## 2012-11-25 DIAGNOSIS — Z1231 Encounter for screening mammogram for malignant neoplasm of breast: Secondary | ICD-10-CM

## 2013-08-25 ENCOUNTER — Other Ambulatory Visit: Payer: Self-pay

## 2013-08-25 DIAGNOSIS — Z1231 Encounter for screening mammogram for malignant neoplasm of breast: Secondary | ICD-10-CM

## 2013-10-05 ENCOUNTER — Encounter: Payer: Self-pay | Admitting: Cardiology

## 2013-11-27 ENCOUNTER — Ambulatory Visit
Admission: RE | Admit: 2013-11-27 | Discharge: 2013-11-27 | Disposition: A | Discharge status: Home / Self Care | Payer: BC Managed Care – PPO | Source: Ambulatory Visit

## 2013-11-27 DIAGNOSIS — Z1231 Encounter for screening mammogram for malignant neoplasm of breast: Secondary | ICD-10-CM

## 2014-01-01 ENCOUNTER — Encounter: Payer: Self-pay | Admitting: General Surgery

## 2014-01-01 ENCOUNTER — Ambulatory Visit: Payer: BC Managed Care – PPO | Admitting: Cardiology

## 2014-01-01 DIAGNOSIS — G4733 Obstructive sleep apnea (adult) (pediatric): Secondary | ICD-10-CM | POA: Insufficient documentation

## 2014-01-01 DIAGNOSIS — E669 Obesity, unspecified: Secondary | ICD-10-CM

## 2014-01-01 DIAGNOSIS — G4730 Sleep apnea, unspecified: Secondary | ICD-10-CM

## 2014-01-01 DIAGNOSIS — I10 Essential (primary) hypertension: Secondary | ICD-10-CM

## 2014-01-09 ENCOUNTER — Ambulatory Visit: Payer: BC Managed Care – PPO | Admitting: Cardiology

## 2014-01-31 ENCOUNTER — Encounter: Payer: Self-pay | Admitting: Cardiology

## 2014-01-31 ENCOUNTER — Ambulatory Visit (INDEPENDENT_AMBULATORY_CARE_PROVIDER_SITE_OTHER): Payer: Medicare PPO | Admitting: Cardiology

## 2014-01-31 VITALS — BP 141/68 | HR 80 | Ht 64.5 in | Wt 191.4 lb

## 2014-01-31 DIAGNOSIS — E669 Obesity, unspecified: Secondary | ICD-10-CM

## 2014-01-31 DIAGNOSIS — I10 Essential (primary) hypertension: Secondary | ICD-10-CM

## 2014-01-31 DIAGNOSIS — G4733 Obstructive sleep apnea (adult) (pediatric): Secondary | ICD-10-CM

## 2014-01-31 NOTE — Patient Instructions (Signed)
Your physician recommends that you continue on your current medications as directed. Please refer to the Current Medication list given to you today.  Dr Radford Pax wrote a rx for CPAP supplies for you today  Your physician wants you to follow-up in: 6 Months with Dr Mallie Snooks will receive a reminder letter in the mail two months in advance. If you don't receive a letter, please call our office to schedule the follow-up appointment.

## 2014-01-31 NOTE — Progress Notes (Signed)
[place], [place],   [id number] Phone: [phone] Fax:  [phone]  Date:  01/31/2014   ID:  GINETTE Vang, DOB February 07, 1946, MRN [id number]  PCP:  Reginia Naas, MD  Sleep Medicine:    [name], MD   History of Present Illness: Amy Vang is a 68 y.o. female with a history of OSA, obesity and HTN presents today for followup.  She is doing well.  She tolerates her CPAP device.  She tolerates the mask without difficulties and feels the pressure is adequate.  She feels rested in the am and has no daytime sleepiness.   Wt Readings from Last 3 Encounters:  04/11/12 174 lb (78.926 kg)  12/09/11 175 lb (79.379 kg)  12/09/11 175 lb (79.379 kg)     Past Medical History  Diagnosis Date  . Hypertension   . History of TIAs AGE 62  AND 2008---  NO RESIDUAL  . GERD (gastroesophageal reflux disease) PT STATES SEVERE---- CONTROLLED W/ NEXIUM  . Allergic rhinitis   . Non-specific gastrointestinal complaint   . Cystocele   . SUI (stress urinary incontinence, female)   . Restless leg syndrome   . Normal echocardiogram 09-14-2007    EF 65%, NORMAL LV  . Rosacea   . Actinic keratosis     Precancerous  . CVA (cerebral infarction)   . OSA (obstructive sleep apnea)     Severe CPAP 12 MM H2O- Dr Radford Pax  . Anxiety     Current Outpatient Prescriptions  Medication Sig Dispense Refill  . aspirin 81 MG tablet Take 160 mg by mouth daily.      Marland Kitchen b complex vitamins tablet Take 1 tablet by mouth daily.      Marland Kitchen Bioflavonoid Products (ESTER C PO) Take 1 tablet by mouth daily.      . Calcium Carbonate-Vitamin D (CALCIUM PLUS VITAMIN D PO) Take 1 tablet by mouth daily.      Marland Kitchen esomeprazole (NEXIUM) 40 MG capsule Take 40 mg by mouth 2 (two) times daily.      . fish oil-omega-3 fatty acids 1000 MG capsule Take 2 g by mouth daily.      . hydrochlorothiazide (HYDRODIURIL) 25 MG tablet Take 25 mg by mouth daily.      . montelukast (SINGULAIR) 10 MG tablet Take 10 mg  by mouth at bedtime.      . polyethylene glycol (MIRALAX / GLYCOLAX) packet Take 17 g by mouth daily.      . potassium chloride SA (K-DUR,KLOR-CON) 20 MEQ tablet Take 20 mEq by mouth daily.      . Probiotic Product (ALIGN PO) Take 1 capsule by mouth every morning.      Marland Kitchen rOPINIRole (REQUIP) 2 MG tablet Take 2 mg by mouth at bedtime.      . sertraline (ZOLOFT) 25 MG tablet Take 25 mg by mouth daily.      . traZODone (DESYREL) 50 MG tablet Two tablets by mouth at bedtime  30 tablet  0   No current facility-administered medications for this visit.    Allergies:    Allergies  Allergen Reactions  . Ambien [Zolpidem Tartrate]   . Ceftin [Cefuroxime Axetil]     Lack of Therapeutic Effect  . Hydroxyzine Hcl   . Penicillins Hives  . Septra [Sulfamethoxazole-Tmp Ds] Rash    Social History:  The patient  reports that she has never smoked. She has never used smokeless tobacco. She reports that she does not drink alcohol  or use illicit drugs.   Family History:  The patient's family history includes CAD in her brother; Colon cancer in her maternal aunt; Diabetes in her mother and sister; Heart attack in her brother and paternal grandfather; Hypertension in her sister; Leukemia in her brother; Stroke in her mother and paternal grandmother. She was adopted.   ROS:  Please see the history of present illness.      All other systems reviewed and negative.   PHYSICAL EXAM: VS:  There were no vitals taken for this visit. Well nourished, well developed, in no acute distress HEENT: normal Neck: no JVD Cardiac:  normal S1, S2; RRR; no murmur Lungs:  clear to auscultation bilaterally, no wheezing, rhonchi or rales Abd: soft, nontender, no hepatomegaly Ext: no edema Skin: warm and dry Neuro:  CNs 2-12 intact, no focal abnormalities noted       ASSESSMENT AND PLAN:  1. OSA on CPAP and tolerating well  - her download today showed an AHI of 1/hr and 97% compliance in using  More than 4 hours nightly.   Her CPAP is set at 12cm H2O 2. HTN well controlled  - continue HCTZ 3. Obesity - I encouaraged her to increase her exercise regimen  Followup with me in  1 year  Signed, [name], MD 01/31/2014 3:50 PM

## 2014-07-26 ENCOUNTER — Ambulatory Visit (INDEPENDENT_AMBULATORY_CARE_PROVIDER_SITE_OTHER): Payer: Medicare PPO | Admitting: Podiatry

## 2014-07-26 ENCOUNTER — Encounter: Payer: Self-pay | Admitting: Podiatry

## 2014-07-26 ENCOUNTER — Ambulatory Visit (INDEPENDENT_AMBULATORY_CARE_PROVIDER_SITE_OTHER): Payer: Medicare PPO

## 2014-07-26 VITALS — BP 131/66 | HR 65 | Resp 16 | Ht 65.0 in | Wt 188.0 lb

## 2014-07-26 DIAGNOSIS — M722 Plantar fascial fibromatosis: Secondary | ICD-10-CM

## 2014-07-26 DIAGNOSIS — L600 Ingrowing nail: Secondary | ICD-10-CM

## 2014-07-26 MED ORDER — TRIAMCINOLONE ACETONIDE 10 MG/ML IJ SUSP
10.0000 mg | Freq: Once | INTRAMUSCULAR | Status: AC
  Administered 2014-07-26: 10 mg

## 2014-07-26 NOTE — Progress Notes (Signed)
   Subjective:    Patient ID: Amy Vang, female    DOB: 1945-12-03, 68 y.o.   MRN: [id number]  HPI Comments: 68 year old female presents the office today with complaints of right heel pain. She states the pain started in July after vacationing at the [facility]. She states the pain is worse when being barefoot the pain is intermittent. She denies any trauma to the area. She has tried ice and Tylenol without any resolution.  She has secondary complaints of possible ingrown toenail on the right hallux. She states there is mild discomfort over the area however denies any drainage or redness around the nail. This has been ongoing for the last several weeks. She had no treatment. No other complaints.  Foot Pain Associated symptoms include fatigue.      Review of Systems  Constitutional: Positive for fatigue.  HENT: Positive for sneezing.   Respiratory: Positive for shortness of breath.   Musculoskeletal:       Difficulty walking  All other systems reviewed and are negative.      Objective:   Physical Exam   AAO x3, NAD  DP/PT pulses palpable b/l. CRT < 3 sec Protective sensation intact the Semmes Weinstein monofilament, vibratory sensation intact. Right heel pain on the plantar aspect near the insertion of the Achilles tendon. No pain with lateral compression of the calcaneus or along the posterior aspect. No edema is present. No pain along the course of the plantar fascia where the Achilles tendon and both are intact. Mild ingrowing along the lateral border of the right hallux toenail. There is no associated drainage, erythema. There is mild tenderness to the distal aspect of the nail.. No open lesions. No calf pain/edema/warmth.     Assessment & Plan:  68 year old female right foot plantar fasciitis, lateral border hallux ingrown toenail -X-rays were obtained. See x-ray report for full details. No acute fracture. -Conservative versus surgical intervention was discussed with the  patient in detail including alternatives, risks, complications. -At this time the patient wishes to proceed with a steroid injection into the right heel to help decrease the inflammation. Under sterile skin preparation a total of 2.5 cc Kenalog 10, 0.5% Marcaine plain, 2% lidocaine plain was infiltrated around the plantar fascia. Patient tolerated the injection well without complications. -Discussed stretching exercises -Dispensed plantar fascial brace -Ice to the affected area -Shoe gear modifications -Patient already has custom molded orthotics which were made for her couple years ago. She has never worn down and brought that into the office. They do still currently fit her foot type. She can start wearing these and she was instructed on the usage. -Nail border sharply debrided to the distal aspect without complications. There is no associated drainage or clinical signs of infection. Discussed either temporary or permanent partial nail avulsion to help prevent recurrent ingrown toenails. Patient wishes to hold off on the procedure at this time. -Followup in one month or sooner any problems are to arise. -Followup with primary care physician for other issues mentioned in the review of systems as there are chronic and no acute changes.

## 2014-07-26 NOTE — Patient Instructions (Signed)
Plantar Fasciitis (Heel Spur Syndrome) with Rehab The plantar fascia is a fibrous, ligament-like, soft-tissue structure that spans the bottom of the foot. Plantar fasciitis is a condition that causes pain in the foot due to inflammation of the tissue. SYMPTOMS   Pain and tenderness on the underneath side of the foot.  Pain that worsens with standing or walking. CAUSES  Plantar fasciitis is caused by irritation and injury to the plantar fascia on the underneath side of the foot. Common mechanisms of injury include:  Direct trauma to bottom of the foot.  Damage to a small nerve that runs under the foot where the main fascia attaches to the heel bone.  Stress placed on the plantar fascia due to bone spurs. RISK INCREASES WITH:   Activities that place stress on the plantar fascia (running, jumping, pivoting, or cutting).  Poor strength and flexibility.  Improperly fitted shoes.  Tight calf muscles.  Flat feet.  Failure to warm-up properly before activity.  Obesity. PREVENTION  Warm up and stretch properly before activity.  Allow for adequate recovery between workouts.  Maintain physical fitness:  Strength, flexibility, and endurance.  Cardiovascular fitness.  Maintain a health body weight.  Avoid stress on the plantar fascia.  Wear properly fitted shoes, including arch supports for individuals who have flat feet. PROGNOSIS  If treated properly, then the symptoms of plantar fasciitis usually resolve without surgery. However, occasionally surgery is necessary. RELATED COMPLICATIONS   Recurrent symptoms that may result in a chronic condition.  Problems of the lower back that are caused by compensating for the injury, such as limping.  Pain or weakness of the foot during push-off following surgery.  Chronic inflammation, scarring, and partial or complete fascia tear, occurring more often from repeated injections. TREATMENT  Treatment initially involves the use of  ice and medication to help reduce pain and inflammation. The use of strengthening and stretching exercises may help reduce pain with activity, especially stretches of the Achilles tendon. These exercises may be performed at home or with a therapist. Your caregiver may recommend that you use heel cups of arch supports to help reduce stress on the plantar fascia. Occasionally, corticosteroid injections are given to reduce inflammation. If symptoms persist for greater than 6 months despite non-surgical (conservative), then surgery may be recommended.  MEDICATION   If pain medication is necessary, then nonsteroidal anti-inflammatory medications, such as aspirin and ibuprofen, or other minor pain relievers, such as acetaminophen, are often recommended.  Do not take pain medication within 7 days before surgery.  Prescription pain relievers may be given if deemed necessary by your caregiver. Use only as directed and only as much as you need.  Corticosteroid injections may be given by your caregiver. These injections should be reserved for the most serious cases, because they may only be given a certain number of times. HEAT AND COLD  Cold treatment (icing) relieves pain and reduces inflammation. Cold treatment should be applied for 10 to 15 minutes every 2 to 3 hours for inflammation and pain and immediately after any activity that aggravates your symptoms. Use ice packs or massage the area with a piece of ice (ice massage).  Heat treatment may be used prior to performing the stretching and strengthening activities prescribed by your caregiver, physical therapist, or athletic trainer. Use a heat pack or soak the injury in warm water. SEEK IMMEDIATE MEDICAL CARE IF:  Treatment seems to offer no benefit, or the condition worsens.  Any medications produce adverse side effects. EXERCISES RANGE   OF MOTION (ROM) AND STRETCHING EXERCISES - Plantar Fasciitis (Heel Spur Syndrome) These exercises may help you  when beginning to rehabilitate your injury. Your symptoms may resolve with or without further involvement from your physician, physical therapist or athletic trainer. While completing these exercises, remember:   Restoring tissue flexibility helps normal motion to return to the joints. This allows healthier, less painful movement and activity.  An effective stretch should be held for at least 30 seconds.  A stretch should never be painful. You should only feel a gentle lengthening or release in the stretched tissue. RANGE OF MOTION - Toe Extension, Flexion  Sit with your right / left leg crossed over your opposite knee.  Grasp your toes and gently pull them back toward the top of your foot. You should feel a stretch on the bottom of your toes and/or foot.  Hold this stretch for __________ seconds.  Now, gently pull your toes toward the bottom of your foot. You should feel a stretch on the top of your toes and or foot.  Hold this stretch for __________ seconds. Repeat __________ times. Complete this stretch __________ times per day.  RANGE OF MOTION - Ankle Dorsiflexion, Active Assisted  Remove shoes and sit on a chair that is preferably not on a carpeted surface.  Place right / left foot under knee. Extend your opposite leg for support.  Keeping your heel down, slide your right / left foot back toward the chair until you feel a stretch at your ankle or calf. If you do not feel a stretch, slide your bottom forward to the edge of the chair, while still keeping your heel down.  Hold this stretch for __________ seconds. Repeat __________ times. Complete this stretch __________ times per day.  STRETCH - Gastroc, Standing  Place hands on wall.  Extend right / left leg, keeping the front knee somewhat bent.  Slightly point your toes inward on your back foot.  Keeping your right / left heel on the floor and your knee straight, shift your weight toward the wall, not allowing your back to  arch.  You should feel a gentle stretch in the right / left calf. Hold this position for __________ seconds. Repeat __________ times. Complete this stretch __________ times per day. STRETCH - Soleus, Standing  Place hands on wall.  Extend right / left leg, keeping the other knee somewhat bent.  Slightly point your toes inward on your back foot.  Keep your right / left heel on the floor, bend your back knee, and slightly shift your weight over the back leg so that you feel a gentle stretch deep in your back calf.  Hold this position for __________ seconds. Repeat __________ times. Complete this stretch __________ times per day. STRETCH - Gastrocsoleus, Standing  Note: This exercise can place a lot of stress on your foot and ankle. Please complete this exercise only if specifically instructed by your caregiver.   Place the ball of your right / left foot on a step, keeping your other foot firmly on the same step.  Hold on to the wall or a rail for balance.  Slowly lift your other foot, allowing your body weight to press your heel down over the edge of the step.  You should feel a stretch in your right / left calf.  Hold this position for __________ seconds.  Repeat this exercise with a slight bend in your right / left knee. Repeat __________ times. Complete this stretch __________ times per day.    STRENGTHENING EXERCISES - Plantar Fasciitis (Heel Spur Syndrome)  These exercises may help you when beginning to rehabilitate your injury. They may resolve your symptoms with or without further involvement from your physician, physical therapist or athletic trainer. While completing these exercises, remember:   Muscles can gain both the endurance and the strength needed for everyday activities through controlled exercises.  Complete these exercises as instructed by your physician, physical therapist or athletic trainer. Progress the resistance and repetitions only as guided. STRENGTH -  Towel Curls  Sit in a chair positioned on a non-carpeted surface.  Place your foot on a towel, keeping your heel on the floor.  Pull the towel toward your heel by only curling your toes. Keep your heel on the floor.  If instructed by your physician, physical therapist or athletic trainer, add ____________________ at the end of the towel. Repeat __________ times. Complete this exercise __________ times per day. STRENGTH - Ankle Inversion  Secure one end of a rubber exercise band/tubing to a fixed object (table, pole). Loop the other end around your foot just before your toes.  Place your fists between your knees. This will focus your strengthening at your ankle.  Slowly, pull your big toe up and in, making sure the band/tubing is positioned to resist the entire motion.  Hold this position for __________ seconds.  Have your muscles resist the band/tubing as it slowly pulls your foot back to the starting position. Repeat __________ times. Complete this exercises __________ times per day.  Document Released: 11/16/2005 Document Revised: 02/08/2012 Document Reviewed: 02/28/2009 [employer] Patient Information 2015 [employer]. This information is not intended to replace advice given to you by your health care provider. Make sure you discuss any questions you have with your health care provider.  

## 2014-07-27 ENCOUNTER — Telehealth: Payer: Self-pay | Admitting: Cardiology

## 2014-07-27 NOTE — Telephone Encounter (Signed)
New problem   Pt is having changes in her breathing that isn't normal. Please call pt concerning this matter

## 2014-07-27 NOTE — Telephone Encounter (Signed)
Called pt and she stated that within the last week of two she has noticed that she feels like she is not getting enough air. She feels she has to take a deep breath more often and she has heavier breathing. Pt states that it is mostly with exertion. There is some when she sits. Pt states she went to the foot Dr Wilburn Mylar and her BP and HR was good at that time. Pt denies any other cardiac issues besides some fluttering of her heart. They do not corollate with the heavy breathing. To Dr Radford Pax to advise  I did make pt aware if symptoms worsened today to call us back  If symptoms worsened over the weekend to go to ED or Urgent care.

## 2014-07-30 NOTE — Telephone Encounter (Signed)
Please have her see her PCP first

## 2014-07-30 NOTE — Telephone Encounter (Signed)
Pt is aware.  

## 2014-08-09 ENCOUNTER — Ambulatory Visit (INDEPENDENT_AMBULATORY_CARE_PROVIDER_SITE_OTHER): Payer: Medicare PPO | Admitting: Podiatry

## 2014-08-09 VITALS — BP 124/76 | HR 76 | Resp 16

## 2014-08-09 DIAGNOSIS — M722 Plantar fascial fibromatosis: Secondary | ICD-10-CM

## 2014-08-09 MED ORDER — TRIAMCINOLONE ACETONIDE 10 MG/ML IJ SUSP: 10.0000 mg | Freq: Once | INTRAMUSCULAR | Status: DC

## 2014-08-09 NOTE — Patient Instructions (Signed)
Plantar Fasciitis (Heel Spur Syndrome) with Rehab The plantar fascia is a fibrous, ligament-like, soft-tissue structure that spans the bottom of the foot. Plantar fasciitis is a condition that causes pain in the foot due to inflammation of the tissue. SYMPTOMS   Pain and tenderness on the underneath side of the foot.  Pain that worsens with standing or walking. CAUSES  Plantar fasciitis is caused by irritation and injury to the plantar fascia on the underneath side of the foot. Common mechanisms of injury include:  Direct trauma to bottom of the foot.  Damage to a small nerve that runs under the foot where the main fascia attaches to the heel bone.  Stress placed on the plantar fascia due to bone spurs. RISK INCREASES WITH:   Activities that place stress on the plantar fascia (running, jumping, pivoting, or cutting).  Poor strength and flexibility.  Improperly fitted shoes.  Tight calf muscles.  Flat feet.  Failure to warm-up properly before activity.  Obesity. PREVENTION  Warm up and stretch properly before activity.  Allow for adequate recovery between workouts.  Maintain physical fitness:  Strength, flexibility, and endurance.  Cardiovascular fitness.  Maintain a health body weight.  Avoid stress on the plantar fascia.  Wear properly fitted shoes, including arch supports for individuals who have flat feet. PROGNOSIS  If treated properly, then the symptoms of plantar fasciitis usually resolve without surgery. However, occasionally surgery is necessary. RELATED COMPLICATIONS   Recurrent symptoms that may result in a chronic condition.  Problems of the lower back that are caused by compensating for the injury, such as limping.  Pain or weakness of the foot during push-off following surgery.  Chronic inflammation, scarring, and partial or complete fascia tear, occurring more often from repeated injections. TREATMENT  Treatment initially involves the use of  ice and medication to help reduce pain and inflammation. The use of strengthening and stretching exercises may help reduce pain with activity, especially stretches of the Achilles tendon. These exercises may be performed at home or with a therapist. Your caregiver may recommend that you use heel cups of arch supports to help reduce stress on the plantar fascia. Occasionally, corticosteroid injections are given to reduce inflammation. If symptoms persist for greater than 6 months despite non-surgical (conservative), then surgery may be recommended.  MEDICATION   If pain medication is necessary, then nonsteroidal anti-inflammatory medications, such as aspirin and ibuprofen, or other minor pain relievers, such as acetaminophen, are often recommended.  Do not take pain medication within 7 days before surgery.  Prescription pain relievers may be given if deemed necessary by your caregiver. Use only as directed and only as much as you need.  Corticosteroid injections may be given by your caregiver. These injections should be reserved for the most serious cases, because they may only be given a certain number of times. HEAT AND COLD  Cold treatment (icing) relieves pain and reduces inflammation. Cold treatment should be applied for 10 to 15 minutes every 2 to 3 hours for inflammation and pain and immediately after any activity that aggravates your symptoms. Use ice packs or massage the area with a piece of ice (ice massage).  Heat treatment may be used prior to performing the stretching and strengthening activities prescribed by your caregiver, physical therapist, or athletic trainer. Use a heat pack or soak the injury in warm water. SEEK IMMEDIATE MEDICAL CARE IF:  Treatment seems to offer no benefit, or the condition worsens.  Any medications produce adverse side effects. EXERCISES RANGE   OF MOTION (ROM) AND STRETCHING EXERCISES - Plantar Fasciitis (Heel Spur Syndrome) These exercises may help you  when beginning to rehabilitate your injury. Your symptoms may resolve with or without further involvement from your physician, physical therapist or athletic trainer. While completing these exercises, remember:   Restoring tissue flexibility helps normal motion to return to the joints. This allows healthier, less painful movement and activity.  An effective stretch should be held for at least 30 seconds.  A stretch should never be painful. You should only feel a gentle lengthening or release in the stretched tissue. RANGE OF MOTION - Toe Extension, Flexion  Sit with your right / left leg crossed over your opposite knee.  Grasp your toes and gently pull them back toward the top of your foot. You should feel a stretch on the bottom of your toes and/or foot.  Hold this stretch for __________ seconds.  Now, gently pull your toes toward the bottom of your foot. You should feel a stretch on the top of your toes and or foot.  Hold this stretch for __________ seconds. Repeat __________ times. Complete this stretch __________ times per day.  RANGE OF MOTION - Ankle Dorsiflexion, Active Assisted  Remove shoes and sit on a chair that is preferably not on a carpeted surface.  Place right / left foot under knee. Extend your opposite leg for support.  Keeping your heel down, slide your right / left foot back toward the chair until you feel a stretch at your ankle or calf. If you do not feel a stretch, slide your bottom forward to the edge of the chair, while still keeping your heel down.  Hold this stretch for __________ seconds. Repeat __________ times. Complete this stretch __________ times per day.  STRETCH - Gastroc, Standing  Place hands on wall.  Extend right / left leg, keeping the front knee somewhat bent.  Slightly point your toes inward on your back foot.  Keeping your right / left heel on the floor and your knee straight, shift your weight toward the wall, not allowing your back to  arch.  You should feel a gentle stretch in the right / left calf. Hold this position for __________ seconds. Repeat __________ times. Complete this stretch __________ times per day. STRETCH - Soleus, Standing  Place hands on wall.  Extend right / left leg, keeping the other knee somewhat bent.  Slightly point your toes inward on your back foot.  Keep your right / left heel on the floor, bend your back knee, and slightly shift your weight over the back leg so that you feel a gentle stretch deep in your back calf.  Hold this position for __________ seconds. Repeat __________ times. Complete this stretch __________ times per day. STRETCH - Gastrocsoleus, Standing  Note: This exercise can place a lot of stress on your foot and ankle. Please complete this exercise only if specifically instructed by your caregiver.   Place the ball of your right / left foot on a step, keeping your other foot firmly on the same step.  Hold on to the wall or a rail for balance.  Slowly lift your other foot, allowing your body weight to press your heel down over the edge of the step.  You should feel a stretch in your right / left calf.  Hold this position for __________ seconds.  Repeat this exercise with a slight bend in your right / left knee. Repeat __________ times. Complete this stretch __________ times per day.    STRENGTHENING EXERCISES - Plantar Fasciitis (Heel Spur Syndrome)  These exercises may help you when beginning to rehabilitate your injury. They may resolve your symptoms with or without further involvement from your physician, physical therapist or athletic trainer. While completing these exercises, remember:   Muscles can gain both the endurance and the strength needed for everyday activities through controlled exercises.  Complete these exercises as instructed by your physician, physical therapist or athletic trainer. Progress the resistance and repetitions only as guided. STRENGTH -  Towel Curls  Sit in a chair positioned on a non-carpeted surface.  Place your foot on a towel, keeping your heel on the floor.  Pull the towel toward your heel by only curling your toes. Keep your heel on the floor.  If instructed by your physician, physical therapist or athletic trainer, add ____________________ at the end of the towel. Repeat __________ times. Complete this exercise __________ times per day. STRENGTH - Ankle Inversion  Secure one end of a rubber exercise band/tubing to a fixed object (table, pole). Loop the other end around your foot just before your toes.  Place your fists between your knees. This will focus your strengthening at your ankle.  Slowly, pull your big toe up and in, making sure the band/tubing is positioned to resist the entire motion.  Hold this position for __________ seconds.  Have your muscles resist the band/tubing as it slowly pulls your foot back to the starting position. Repeat __________ times. Complete this exercises __________ times per day.  Document Released: 11/16/2005 Document Revised: 02/08/2012 Document Reviewed: 02/28/2009 [employer] Patient Information 2015 [employer]. This information is not intended to replace advice given to you by your health care provider. Make sure you discuss any questions you have with your health care provider.  

## 2014-08-10 NOTE — Progress Notes (Signed)
Patient ID: Amy Vang, female   DOB: 30-May-1946, 68 y.o.   MRN: [id number]  Subjective: Amy Vang returns the office they for followup evaluation of right heel pain. She was last seen in the office 2 weeks ago for plantar fasciitis. At that time she underwent an injection for which she states that she got some relief from. However she states that she is going to [employer] this morning and would like another injection. She states that her pain is only with weightbearing and without shoes. She states that she does not have pain with certain shoes. She has not been doing the stretching exercises. She has been continuing with the plantar fascial brace as well as ice. No other complaints at this time.   Objective: AAO x3, NAD DP/PT pulses palpable 2/4. CRT < 3sec Protective sensation intact. Pain along the plantar aspect of the right heel along the insertion of the plantar fascia. No pain with lateral compression of the calcaneus or along the posterior aspect of the calcaneus. No pain vibratory sensation over the calcaneus. No pain along the course of the plantar fascia. Plantar fascia appears intact. MMT 5/5, ROM WNL No open lesions  Assessment: 68 year old female with a right foot plantar fasciitis.  Plan: -Various treatment options discussed including alternatives, risks, complications. -At this time patient elects to proceed with another steroid injection into her right heel. Under sterile skin preparation a total of 2.5 cc of Kenalog 10, 0.5% Marcaine plain, 2% lidocaine plain was infiltrated in the symptomatic area to the right heel. Patient tolerated the injection well without complications. -Continue ice to the affected area -Continue a stretching exercises. Patient was given instructions again today on how to perform these. -Continue plantar fascial brace. -Continue supportive shoe gear. -Followup in 3 weeks or sooner if any problems are to arise. Call with any questions or  concerns.

## 2014-08-23 ENCOUNTER — Ambulatory Visit: Payer: Medicare PPO | Admitting: Podiatry

## 2014-09-06 ENCOUNTER — Ambulatory Visit: Payer: Medicare PPO | Admitting: Podiatry

## 2014-09-11 ENCOUNTER — Other Ambulatory Visit: Payer: Self-pay

## 2014-09-11 DIAGNOSIS — Z1231 Encounter for screening mammogram for malignant neoplasm of breast: Secondary | ICD-10-CM

## 2014-09-12 ENCOUNTER — Ambulatory Visit (INDEPENDENT_AMBULATORY_CARE_PROVIDER_SITE_OTHER): Payer: Medicare PPO | Admitting: Podiatry

## 2014-09-12 VITALS — BP 154/75 | HR 68 | Resp 16

## 2014-09-12 DIAGNOSIS — M722 Plantar fascial fibromatosis: Secondary | ICD-10-CM

## 2014-09-12 DIAGNOSIS — L600 Ingrowing nail: Secondary | ICD-10-CM

## 2014-09-12 MED ORDER — METHYLPREDNISOLONE (PAK) 4 MG PO TABS: ORAL_TABLET | ORAL | Status: DC

## 2014-09-12 NOTE — Progress Notes (Signed)
She presents today for followup and a second opinion of her plantar fasciitis of her right foot. She also brought and arrange shoes with her today. She states that she is better than she was but she's concerned that it may be heel spur and not just plantar fasciitis.  Objective: Vital signs are stable she is alert and oriented x3. She has gone palpable pulses to the bilateral lower extremities. She has pain on palpation medial continued tubercle of the right heel. I reviewed her previous radiographs which do indicate an increase in thickness at the plantar fascia at the calcaneal insertion site and she does have 2 small plantar calcaneal heel spurs and larger retrocalcaneal heel spur.  Assessment: Plantar fasciitis right.  Plan: Discussed etiology pathology conservative versus surgical therapies. Injected the right heel today with Kenalog and local anesthetic to the point of maximal tenderness. Dispensed a night splint. Discussed the possibility of orthotics. Discuss the appropriate shoes shoe gear stretching exercises and ice therapy. And dispensed a prescription for prednisone. Followup with her in 4 weeks

## 2014-09-17 ENCOUNTER — Ambulatory Visit: Payer: Medicare PPO | Admitting: Podiatry

## 2014-10-17 ENCOUNTER — Ambulatory Visit: Payer: Medicare PPO | Admitting: Podiatry

## 2014-11-27 ENCOUNTER — Encounter: Payer: Self-pay | Admitting: Cardiology

## 2014-11-28 ENCOUNTER — Ambulatory Visit
Admission: RE | Admit: 2014-11-28 | Discharge: 2014-11-28 | Disposition: A | Discharge status: Home / Self Care | Payer: Medicare PPO | Source: Ambulatory Visit

## 2014-11-28 DIAGNOSIS — Z1231 Encounter for screening mammogram for malignant neoplasm of breast: Secondary | ICD-10-CM

## 2015-01-01 ENCOUNTER — Telehealth: Payer: Self-pay | Admitting: Cardiology

## 2015-01-01 NOTE — Telephone Encounter (Signed)
She can increase the humidity dial as needed but if she starts getting water and condensation in the tubing and mask then she has turned it up too high and needs to turn it back.  We could also add a chin strap to keep her mouth closed if she would likd

## 2015-01-01 NOTE — Telephone Encounter (Signed)
Please find out if she is using heated humidifier with it

## 2015-01-01 NOTE — Telephone Encounter (Signed)
Pt  said that she has had   Laryngitis for 6 weeks. Her PCP sent her to an allergy doctor. Pt has been using her C-pap every night. She notice that she wakes up with dry scratchy throat. Pt wonders if it is the C-PAP that is doing that, and if she needs to change the settings.

## 2015-01-01 NOTE — Telephone Encounter (Signed)
Pt said that yes, she is using the heated humidifier . She would like to know if she needs to change the settings, up or down?

## 2015-01-01 NOTE — Telephone Encounter (Signed)
New Message         Pt states that she has had Laryngitis for 6 weeks, pt states she wakes up with a dry scrathy throat and wonders if it has anything to do with her Cpap machine. Please call back and advise.

## 2015-01-01 NOTE — Telephone Encounter (Signed)
Pt is aware of MD's recommendations. Pt verbalized understanding. Pt would like to know where she can fine a chin strap for her to buy . Pt would like for Katy Dr. Theodosia Blender nurse to call her back tomorrow.

## 2015-01-02 NOTE — Telephone Encounter (Signed)
Left message to call back  

## 2015-01-07 NOTE — Telephone Encounter (Signed)
Left message to call back  

## 2015-01-16 NOTE — Telephone Encounter (Signed)
Left message to call back  

## 2015-01-21 NOTE — Telephone Encounter (Signed)
Letter sent for patient to call the office.

## 2015-02-05 ENCOUNTER — Telehealth: Payer: Self-pay | Admitting: Cardiology

## 2015-02-05 NOTE — Telephone Encounter (Signed)
New message      Received a letter from [place]. She want Valetta Fuller to know that she is doing great and will see you at her next appt

## 2015-02-08 ENCOUNTER — Ambulatory Visit: Payer: Medicare PPO | Admitting: Cardiology

## 2015-03-05 NOTE — Progress Notes (Signed)
Cardiology Office Note   Date:  03/06/2015   ID:  Amy Vang, DOB 05-Apr-1946, MRN [id number]  PCP:  Reginia Naas, MD    Chief Complaint  Patient presents with  . Sleep Apnea  . Hypertension  . Obesity      History of Present Illness: Amy Vang is a 69 y.o. female with a history of OSA, obesity and HTN presents today for followup. She is doing well. She tolerates her CPAP device. She tolerates the nasal pillow mask without difficulties and feels the pressure is adequate. She sleeps well at night and uses clonazepam to help sleep.  She feels rested in the am and rarely has daytime sleepiness.  She snores if she does not use her mask.  She had a bad URI in December that lasted a few months and is just now getting her voice back.  She has been on inhalers some for right sided chest tightness for bronchitis and the inhaler resolves the tightness.  She had some SOB from her URI that is improving.  She has had some fatigue since her URI as well.     Past Medical History  Diagnosis Date  . Hypertension   . History of TIAs AGE 12  AND 2008---  NO RESIDUAL  . GERD (gastroesophageal reflux disease) PT STATES SEVERE---- CONTROLLED W/ NEXIUM  . Allergic rhinitis   . Non-specific gastrointestinal complaint   . Cystocele   . SUI (stress urinary incontinence, female)   . Restless leg syndrome   . Normal echocardiogram 09-14-2007    EF 65%, NORMAL LV  . Rosacea   . Actinic keratosis     Precancerous  . CVA (cerebral infarction)   . OSA (obstructive sleep apnea)     Severe CPAP 12 MM H2O- Dr Radford Pax  . Anxiety     Past Surgical History  Procedure Laterality Date  . Carpal tunnel release  2009    BILATERAL  . Pulley release right thumb  2009  . Vaginal hysterectomy  AGE 89  . Bilateral benign breast bx's  20 YRS AGO  . Noninvasive vascular carotid study  09-14-2007    BILATERAL MILD MIX PLAQUE THROUGHOUT, NO SIGNIFICANT BILATERAL ICA STENOSIS  . Cystocele  repair  12/11/2011    Procedure: ANTERIOR REPAIR (CYSTOCELE);  Surgeon: Claybon Jabs, MD;  Location: [facility];  Service: Urology;  Laterality: N/A;  1 1/2 hour requested for case  Anterior repair and mid Urethral Sling  . Pubovaginal sling  12/11/2011    Procedure: Gaynelle Arabian;  Surgeon: Claybon Jabs, MD;  Location: [facility];  Service: Urology;  Laterality: N/A;     Current Outpatient Prescriptions  Medication Sig Dispense Refill  . aspirin 81 MG tablet Take 160 mg by mouth daily.    Marland Kitchen Bioflavonoid Products (ESTER C PO) Take 1 tablet by mouth daily.    . Calcium Carbonate-Vitamin D (CALCIUM PLUS VITAMIN D PO) Take 1 tablet by mouth daily.    Marland Kitchen esomeprazole (NEXIUM) 40 MG capsule Take 40 mg by mouth 2 (two) times daily.    . fish oil-omega-3 fatty acids 1000 MG capsule Take 2 g by mouth daily.    . fluticasone (FLONASE) 50 MCG/ACT nasal spray   2  . hydrochlorothiazide (HYDRODIURIL) 25 MG tablet Take 25 mg by mouth daily.    Marland Kitchen ketoconazole (NIZORAL) 2 % cream Apply topically 2 (two) times daily. Infected area  3  . methylPREDNIsolone (MEDROL DOSPACK) 4  MG tablet follow package directions 21 tablet 0  . montelukast (SINGULAIR) 10 MG tablet Take 10 mg by mouth at bedtime.    . polyethylene glycol (MIRALAX / GLYCOLAX) packet Take 17 g by mouth daily.    . potassium chloride SA (K-DUR,KLOR-CON) 20 MEQ tablet Take 20 mEq by mouth daily.    . Probiotic Product (ALIGN PO) Take 1 capsule by mouth every morning.    Marland Kitchen QVAR 40 MCG/ACT inhaler Take 40 mcg by mouth daily. Pt take 2 puffs daily  3  . ranitidine (ZANTAC) 300 MG tablet Take 300 mg by mouth at bedtime.    Marland Kitchen rOPINIRole (REQUIP) 2 MG tablet Take 2 mg by mouth at bedtime.    . sertraline (ZOLOFT) 50 MG tablet Take 50 mg by mouth daily. In morning     Current Facility-Administered Medications  Medication Dose Route Frequency Provider Last Rate Last Dose  . triamcinolone acetonide (KENALOG) 10  MG/ML injection 10 mg  10 mg Other Once Trula Slade, DPM        Allergies:   Ambien; Ceftin; Hydroxyzine hcl; Penicillins; and Septra    Social History:  The patient  reports that she has never smoked. She has never used smokeless tobacco. She reports that she does not drink alcohol or use illicit drugs.   Family History:  The patient's family history includes CAD in her brother; Colon cancer in her maternal aunt; Diabetes in her mother and sister; Heart attack in her brother and paternal grandfather; Hypertension in her sister; Leukemia in her brother; Stroke in her mother and paternal grandmother. She was adopted.    ROS:  Please see the history of present illness.   Otherwise, review of systems are positive for none.   All other systems are reviewed and negative.    PHYSICAL EXAM: VS:  BP 142/64 mmHg  Pulse 82  Ht 5\' 5"  (1.651 m)  Wt 192 lb 1.9 oz (87.145 kg)  BMI 31.97 kg/m2  SpO2 96% , BMI Body mass index is 31.97 kg/(m^2). GEN: Well nourished, well developed, in no acute distress HEENT: normal Neck: no JVD, carotid bruits, or masses Cardiac: RRR; no murmurs, rubs, or gallops,no edema  Respiratory:  clear to auscultation bilaterally, normal work of breathing GI: soft, nontender, nondistended, + BS MS: no deformity or atrophy Skin: warm and dry, no rash Neuro:  Strength and sensation are intact Psych: euthymic mood, full affect   EKG:  EKG was ordered today and showed NSR with no ST changes and septal infarct    Recent Labs: No results found for requested labs within last 365 days.    Lipid Panel    Component Value Date/Time   CHOL 142 04/12/2012 0740   TRIG 192* 04/12/2012 0740   HDL 29* 04/12/2012 0740   CHOLHDL 4.9 04/12/2012 0740   VLDL 38 04/12/2012 0740   LDLCALC 75 04/12/2012 0740      Wt Readings from Last 3 Encounters:  03/06/15 192 lb 1.9 oz (87.145 kg)  07/26/14 188 lb (85.276 kg)  01/31/14 191 lb 6.4 oz (86.818 kg)    ASSESSMENT AND  PLAN:  1. OSA on CPAP and tolerating well - her download today showed an AHI of 1.6/hr and 100% compliance in using More than 4 hours nightly. Her CPAP is set at 12cm H2O 2. HTN well controlled - continue HCTZ  - check BMET 3. Obesity - I encouaraged her to increase her exercise regimen 4. SOB that is most likely residual from  her recent URI.  I will get a 2D echo to make sure her LVF is normal.  I will also check a TSH since she is complaining of fatigue and hair thinning. She has had some right sided chest tightness that started with her URI and resolves with inhalers.  It does not sound anginal but I will see her back in 4 weeks and if it has not resolved with resolution of her bronchitis I will get a stress test. EKG in office today is nonischemic   Current medicines are reviewed at length with the patient today.  The patient does not have concerns regarding medicines.  The following changes have been made:  no change  Labs/ tests ordered today include: see above assessment and plan  Orders Placed This Encounter  Procedures  . Basic Metabolic Panel (BMET)  . TSH  . EKG 12-Lead  . 2D Echocardiogram without contrast     Disposition:   FU with me in 4 weeks   Signed, Sueanne Margarita, MD  03/06/2015 3:56 PM    [facility] [place], [place], [place]  [address] Phone: [phone]; Fax: [phone]

## 2015-03-06 ENCOUNTER — Ambulatory Visit (INDEPENDENT_AMBULATORY_CARE_PROVIDER_SITE_OTHER): Payer: Medicare PPO | Admitting: Cardiology

## 2015-03-06 ENCOUNTER — Encounter: Payer: Self-pay | Admitting: Cardiology

## 2015-03-06 VITALS — BP 142/64 | HR 82 | Ht 65.0 in | Wt 192.1 lb

## 2015-03-06 DIAGNOSIS — I10 Essential (primary) hypertension: Secondary | ICD-10-CM | POA: Diagnosis not present

## 2015-03-06 DIAGNOSIS — G4733 Obstructive sleep apnea (adult) (pediatric): Secondary | ICD-10-CM

## 2015-03-06 DIAGNOSIS — R0602 Shortness of breath: Secondary | ICD-10-CM | POA: Diagnosis not present

## 2015-03-06 DIAGNOSIS — E669 Obesity, unspecified: Secondary | ICD-10-CM | POA: Diagnosis not present

## 2015-03-06 NOTE — Patient Instructions (Signed)
Your physician recommends that you continue on your current medications as directed. Please refer to the Current Medication list given to you today.  Your physician recommends that you have lab work today (BMET, [facility])  Your physician has requested that you have an echocardiogram. Echocardiography is a painless test that uses sound waves to create images of your heart. It provides your doctor with information about the size and shape of your heart and how well your heart's chambers and valves are working. This procedure takes approximately one hour. There are no restrictions for this procedure.  Your physician recommends that you schedule a follow-up appointment in: 4 weeks with Dr. Radford Pax.

## 2015-03-07 LAB — BASIC METABOLIC PANEL
BUN: 13 mg/dL (ref 6–23)
CHLORIDE: 100 meq/L (ref 96–112)
CO2: 32 meq/L (ref 19–32)
CREATININE: 0.77 mg/dL (ref 0.40–1.20)
Calcium: 9.2 mg/dL (ref 8.4–10.5)
GFR: 79.38 mL/min (ref >60.00)
GLUCOSE: 121 mg/dL — AB (ref 70–99)
Potassium: 3.8 mEq/L (ref 3.5–5.1)
Sodium: 138 mEq/L (ref 135–145)

## 2015-03-07 LAB — TSH: TSH: 1.3 u[IU]/mL (ref 0.35–4.50)

## 2015-03-11 ENCOUNTER — Ambulatory Visit (HOSPITAL_COMMUNITY): Payer: Medicare PPO | Attending: Family Medicine | Admitting: Cardiology

## 2015-03-11 DIAGNOSIS — R0602 Shortness of breath: Secondary | ICD-10-CM | POA: Diagnosis present

## 2015-03-11 DIAGNOSIS — I340 Nonrheumatic mitral (valve) insufficiency: Secondary | ICD-10-CM | POA: Diagnosis not present

## 2015-03-11 DIAGNOSIS — I071 Rheumatic tricuspid insufficiency: Secondary | ICD-10-CM | POA: Insufficient documentation

## 2015-03-11 NOTE — Progress Notes (Signed)
Echo performed. 

## 2015-03-12 ENCOUNTER — Telehealth: Payer: Self-pay

## 2015-03-12 DIAGNOSIS — I272 Pulmonary hypertension, unspecified: Secondary | ICD-10-CM

## 2015-03-12 NOTE — Telephone Encounter (Signed)
Informed patient of ECHO results and verbal understanding expressed.  Patient coming for BNP tomorrow. Repeat ECHO ordered to be scheduled in 1 year. Patient agrees with treatment plan.

## 2015-03-12 NOTE — Telephone Encounter (Signed)
-----   Message from Sueanne Margarita, MD sent at 03/11/2015  9:47 PM EDT ----- Some evidence of elevated filling pressures - please have patient come in for BNP

## 2015-03-13 ENCOUNTER — Other Ambulatory Visit (INDEPENDENT_AMBULATORY_CARE_PROVIDER_SITE_OTHER): Payer: Medicare PPO | Admitting: *Deleted

## 2015-03-13 DIAGNOSIS — I270 Primary pulmonary hypertension: Secondary | ICD-10-CM | POA: Diagnosis not present

## 2015-03-13 DIAGNOSIS — I272 Pulmonary hypertension, unspecified: Secondary | ICD-10-CM

## 2015-03-13 LAB — BRAIN NATRIURETIC PEPTIDE: PRO B NATRI PEPTIDE: 17 pg/mL (ref 0.0–100.0)

## 2015-03-19 ENCOUNTER — Encounter: Payer: Self-pay | Admitting: Cardiology

## 2015-04-16 ENCOUNTER — Telehealth: Payer: Self-pay | Admitting: Cardiology

## 2015-04-16 NOTE — Telephone Encounter (Signed)
New message      Called to confirm appt with pt.  She wanted to know if she still needed to come become he echo was normal.  Please call

## 2015-04-16 NOTE — Telephone Encounter (Signed)
Informed patient that Dr. Radford Pax wanted to see her in 4 weeks to FU to see if symptoms have resolved.  Patient thankful for reply.

## 2015-04-17 ENCOUNTER — Ambulatory Visit (INDEPENDENT_AMBULATORY_CARE_PROVIDER_SITE_OTHER): Payer: Medicare PPO | Admitting: Cardiology

## 2015-04-17 ENCOUNTER — Encounter: Payer: Self-pay | Admitting: Cardiology

## 2015-04-17 VITALS — BP 140/58 | HR 91 | Ht 65.0 in | Wt 194.6 lb

## 2015-04-17 DIAGNOSIS — R0602 Shortness of breath: Secondary | ICD-10-CM | POA: Diagnosis not present

## 2015-04-17 DIAGNOSIS — E669 Obesity, unspecified: Secondary | ICD-10-CM | POA: Diagnosis not present

## 2015-04-17 DIAGNOSIS — I10 Essential (primary) hypertension: Secondary | ICD-10-CM

## 2015-04-17 NOTE — Patient Instructions (Signed)

## 2015-04-17 NOTE — Progress Notes (Signed)
Cardiology Office Note   Date:  04/17/2015   ID:  Amy Vang, DOB 1946/04/05, MRN [id number]  PCP:  Reginia Naas, MD  Cardiologist:   Sueanne Margarita, MD   Chief Complaint  Patient presents with  . Follow-up    Sleep Apnea      History of Present Illness: Amy Vang is a 69 y.o. female with a history of OSA, obesity and HTN presents today for followup. She is doing well. She is here for followup of her SOB that started in the setting of URI.  She was having some residual SOB the last time I saw her and an echo showed normal LVF.  Her SOB has significantly improved and she is able to get outside walking.  She is on inhalers until the summer and she only feels SOB if she forgets to take it.  She denies any chest pain or pressure, LE edema.    Past Medical History  Diagnosis Date  . Hypertension   . History of TIAs AGE 2  AND 2008---  NO RESIDUAL  . GERD (gastroesophageal reflux disease) PT STATES SEVERE---- CONTROLLED W/ NEXIUM  . Allergic rhinitis   . Non-specific gastrointestinal complaint   . Cystocele   . SUI (stress urinary incontinence, female)   . Restless leg syndrome   . Normal echocardiogram 09-14-2007    EF 65%, NORMAL LV  . Rosacea   . Actinic keratosis     Precancerous  . CVA (cerebral infarction)   . OSA (obstructive sleep apnea)     Severe CPAP 12 MM H2O- Dr Radford Pax  . Anxiety     Past Surgical History  Procedure Laterality Date  . Carpal tunnel release  2009    BILATERAL  . Pulley release right thumb  2009  . Vaginal hysterectomy  AGE 56  . Bilateral benign breast bx's  20 YRS AGO  . Noninvasive vascular carotid study  09-14-2007    BILATERAL MILD MIX PLAQUE THROUGHOUT, NO SIGNIFICANT BILATERAL ICA STENOSIS  . Cystocele repair  12/11/2011    Procedure: ANTERIOR REPAIR (CYSTOCELE);  Surgeon: Claybon Jabs, MD;  Location: [facility];  Service: Urology;  Laterality: N/A;  1 1/2 hour requested for  case  Anterior repair and mid Urethral Sling  . Pubovaginal sling  12/11/2011    Procedure: Gaynelle Arabian;  Surgeon: Claybon Jabs, MD;  [facility];  Service: Urology;  Laterality: N/A;     Current Outpatient Prescriptions  Medication Sig Dispense Refill  . aspirin 81 MG tablet Take 160 mg by mouth daily.    Marland Kitchen Bioflavonoid Products (ESTER C PO) Take 1 tablet by mouth daily.    . Calcium Carbonate-Vitamin D (CALCIUM PLUS VITAMIN D PO) Take 1 tablet by mouth daily.    Marland Kitchen esomeprazole (NEXIUM) 40 MG capsule Take 40 mg by mouth 2 (two) times daily.    . fish oil-omega-3 fatty acids 1000 MG capsule Take 2 g by mouth daily.    . fluticasone (FLONASE) 50 MCG/ACT nasal spray   2  . hydrochlorothiazide (HYDRODIURIL) 25 MG tablet Take 25 mg by mouth daily.    Marland Kitchen ketoconazole (NIZORAL) 2 % cream Apply topically 2 (two) times daily. Infected area  3  . methylPREDNIsolone (MEDROL DOSPACK) 4 MG tablet follow package directions 21 tablet 0  . montelukast (SINGULAIR) 10 MG tablet Take 10 mg by mouth at bedtime.    . polyethylene glycol (MIRALAX / GLYCOLAX) packet Take 17 g  by mouth daily.    . Probiotic Product (ALIGN PO) Take 1 capsule by mouth every morning.    Marland Kitchen QVAR 40 MCG/ACT inhaler Take 40 mcg by mouth daily. Pt take 2 puffs daily  3  . ranitidine (ZANTAC) 300 MG tablet Take 300 mg by mouth at bedtime.    Marland Kitchen rOPINIRole (REQUIP) 2 MG tablet Take 2 mg by mouth at bedtime.    . sertraline (ZOLOFT) 50 MG tablet Take 50 mg by mouth daily. In morning    . potassium chloride (MICRO-K) 10 MEQ CR capsule Take 10 mEq by mouth daily.     Current Facility-Administered Medications  Medication Dose Route Frequency Provider Last Rate Last Dose  . triamcinolone acetonide (KENALOG) 10 MG/ML injection 10 mg  10 mg Other Once Trula Slade, DPM        Allergies:   Ambien; Ceftin; Hydroxyzine hcl; Penicillins; and Septra    Social History:  The patient  reports that she has  never smoked. She has never used smokeless tobacco. She reports that she does not drink alcohol or use illicit drugs.   Family History:  The patient's family history includes CAD in her brother; Colon cancer in her maternal aunt; Diabetes in her mother and sister; Heart attack in her brother and paternal grandfather; Hypertension in her sister; Leukemia in her brother; Stroke in her mother and paternal grandmother. She was adopted.    ROS:  Please see the history of present illness.   Otherwise, review of systems are positive for none.   All other systems are reviewed and negative.    PHYSICAL EXAM: VS:  BP 140/58 mmHg  Pulse 91  Ht 5\' 5"  (1.651 m)  Wt 194 lb 9.6 oz (88.27 kg)  BMI 32.38 kg/m2  SpO2 93% , BMI Body mass index is 32.38 kg/(m^2). GEN: Well nourished, well developed, in no acute distress HEENT: normal Neck: no JVD, carotid bruits, or masses Cardiac: RRR; no murmurs, rubs, or gallops,no edema  Respiratory:  clear to auscultation bilaterally, normal work of breathing GI: soft, nontender, nondistended, + BS MS: no deformity or atrophy Skin: warm and dry, no rash Neuro:  Strength and sensation are intact Psych: euthymic mood, full affect   EKG:  EKG is not ordered today.    Recent Labs: 03/06/2015: BUN 13; Creatinine 0.77; Potassium 3.8; Sodium 138; TSH 1.30 03/13/2015: Pro B Natriuretic peptide (BNP) 17.0    Lipid Panel    Component Value Date/Time   CHOL 142 04/12/2012 0740   TRIG 192* 04/12/2012 0740   HDL 29* 04/12/2012 0740   CHOLHDL 4.9 04/12/2012 0740   VLDL 38 04/12/2012 0740   LDLCALC 75 04/12/2012 0740      Wt Readings from Last 3 Encounters:  04/17/15 194 lb 9.6 oz (88.27 kg)  03/06/15 192 lb 1.9 oz (87.145 kg)  07/26/14 188 lb (85.276 kg)     ASSESSMENT AND PLAN: 1.  SOB that is most likely residual from her recent URI.  It has significantly improved and essentially resolved unless she forgets her inhaler.      2.  HTN well  controlled - continue HCTZ 3.   Obesity - I encouaraged her to increase her exercise regimen    Current medicines are reviewed at length with the patient today.  The patient does not have concerns regarding medicines.  The following changes have been made:  no change  Labs/ tests ordered today include: none  No orders of the defined types were placed  in this encounter.     Disposition:   FU with me in 1 year  Signed, Sueanne Margarita, MD  04/17/2015 3:23 PM    [facility] [place], [place], [place]  [address] Phone: [phone]; Fax: [phone]

## 2015-05-27 ENCOUNTER — Other Ambulatory Visit: Payer: Self-pay

## 2015-09-19 ENCOUNTER — Telehealth: Payer: Self-pay

## 2015-09-19 NOTE — Telephone Encounter (Signed)
Patient currently uses [facility] in [place] for all her prescriptions. She would like to use [facility] for her Sulticasone Propionate with a 3 month supply.   Please Advise  Thanks

## 2015-09-20 MED ORDER — FLUTICASONE PROPIONATE 50 MCG/ACT NA SUSP: 2.0000 | Freq: Every day | NASAL | Status: DC

## 2015-09-20 NOTE — Telephone Encounter (Signed)
Pt aware we will send in fluticasone 3 months supply.

## 2015-09-20 NOTE — Telephone Encounter (Addendum)
Left message to return call 

## 2015-09-30 ENCOUNTER — Other Ambulatory Visit: Payer: Self-pay

## 2015-09-30 MED ORDER — BECLOMETHASONE DIPROPIONATE 40 MCG/ACT IN AERS: 2.0000 | INHALATION_SPRAY | Freq: Two times a day (BID) | RESPIRATORY_TRACT | Status: DC

## 2015-10-18 ENCOUNTER — Other Ambulatory Visit: Payer: Self-pay | Admitting: Family Medicine

## 2015-10-18 DIAGNOSIS — N6310 Unspecified lump in the right breast, unspecified quadrant: Secondary | ICD-10-CM

## 2015-10-29 ENCOUNTER — Ambulatory Visit
Admission: RE | Admit: 2015-10-29 | Discharge: 2015-10-29 | Disposition: A | Discharge status: Home / Self Care | Payer: Medicare PPO | Source: Ambulatory Visit | Attending: Family Medicine | Admitting: Family Medicine

## 2015-10-29 ENCOUNTER — Other Ambulatory Visit: Payer: Self-pay

## 2015-10-29 DIAGNOSIS — N6310 Unspecified lump in the right breast, unspecified quadrant: Secondary | ICD-10-CM

## 2015-10-29 MED ORDER — BECLOMETHASONE DIPROPIONATE 40 MCG/ACT IN AERS: 2.0000 | INHALATION_SPRAY | Freq: Two times a day (BID) | RESPIRATORY_TRACT | Status: DC

## 2015-10-30 ENCOUNTER — Other Ambulatory Visit: Payer: Self-pay | Admitting: Neurology

## 2015-10-30 MED ORDER — BECLOMETHASONE DIPROPIONATE 40 MCG/ACT IN AERS: 2.0000 | INHALATION_SPRAY | Freq: Two times a day (BID) | RESPIRATORY_TRACT | Status: DC

## 2015-11-12 ENCOUNTER — Ambulatory Visit (INDEPENDENT_AMBULATORY_CARE_PROVIDER_SITE_OTHER): Payer: Medicare PPO | Admitting: Allergy and Immunology

## 2015-11-12 ENCOUNTER — Encounter: Payer: Self-pay | Admitting: Allergy and Immunology

## 2015-11-12 VITALS — BP 152/80 | HR 68 | Resp 16 | Ht 63.78 in | Wt 193.3 lb

## 2015-11-12 DIAGNOSIS — J387 Other diseases of larynx: Secondary | ICD-10-CM | POA: Diagnosis not present

## 2015-11-12 DIAGNOSIS — H1010 Acute atopic conjunctivitis, unspecified eye: Secondary | ICD-10-CM | POA: Diagnosis not present

## 2015-11-12 DIAGNOSIS — J309 Allergic rhinitis, unspecified: Secondary | ICD-10-CM | POA: Diagnosis not present

## 2015-11-12 DIAGNOSIS — K219 Gastro-esophageal reflux disease without esophagitis: Secondary | ICD-10-CM

## 2015-11-12 DIAGNOSIS — J4540 Moderate persistent asthma, uncomplicated: Secondary | ICD-10-CM

## 2015-11-12 MED ORDER — BUDESONIDE-FORMOTEROL FUMARATE 160-4.5 MCG/ACT IN AERO: INHALATION_SPRAY | RESPIRATORY_TRACT | Status: DC

## 2015-11-12 NOTE — Patient Instructions (Signed)
  1. Change Qvar to Symbicort 160 two inhalations two times per day  2. Continue montelukast 10mg  one tablet one time per day  3. For "cold":   A. Nasal saline multiple times per day  B. OTC claritin / allegra / zyrtec one time per day  C. OTC mucinex DM 1-2 tablets two times per day  4. Continue Proair HFA 2 inhalations every 4-6 hours if needed.  5. Continue Nexium 40 2 times per day + Ranitidine 300 in evening  6. Revisit with Dr. Watt Climes  7. Return in 6 weeks or earlier if problem

## 2015-11-12 NOTE — Progress Notes (Signed)
[facility]  Follow-up Note  Refering Provider: Carol Ada, MD Primary Provider: Reginia Naas, MD  Subjective:   Amy Vang is a 69 y.o. female who returns to the [facility] in re-evaluation of the following:  HPI Comments:  Amy Vang returns to this clinic on 11/12/2015 in reevaluation of her asthma and allergic rhinoconjunctivitis and laryngopharyngeal reflux. She contacted me by telephone in August 2016 noting that she restarted her Qvar and her nasal steroid because she was having more respiratory tract symptoms including runny nose and coughing. It does appear as though a lot of her coughing has abated but she still short of breath. Most of her shortness of breath comes about from exertion. She's had this problem now for a prolonged period in time and she is seen multiple doctors for this issue. Back in April and had a discussion with her about undergoing a progressive exercise routine in an attempt to address this issue. She's not really sure that using a short acting bronchodilator is helping her very much regarding this issue. She believes that her reflux is under pretty good control but she still has epigastric pain on occasion even while utilizing her current medical therapy for reflux which includes Nexium 40 mg twice a day and ranitidine 300 mg in the evening. Recently, over the course of the past 2 days, she has developed a little bit of throat clearing and cough and runny nose.   Outpatient Encounter Prescriptions as of 11/12/2015  Medication Sig  . albuterol (PROAIR HFA) 108 (90 BASE) MCG/ACT inhaler Inhale two puffs every four to six hours as needed for cough or wheeze.  Marland Kitchen aspirin 81 MG tablet Take 160 mg by mouth daily.  . beclomethasone (QVAR) 40 MCG/ACT inhaler Inhale 2 puffs into the lungs 2 (two) times daily.  Marland Kitchen Bioflavonoid Products (ESTER C PO) Take 1 tablet by mouth daily.  . Calcium  Carbonate-Vitamin D (CALCIUM PLUS VITAMIN D PO) Take 1 tablet by mouth daily.  . citalopram (CELEXA) 20 MG tablet TAKE 1/2 TABLET DAILY FOR 6 DAYS AND THEN INCREASE TO ONE TABLET ONCE A DAY ORALLY 30 DAY(S)  . esomeprazole (NEXIUM) 40 MG capsule Take 40 mg by mouth 2 (two) times daily.  . fish oil-omega-3 fatty acids 1000 MG capsule Take 2 g by mouth daily.  . fluticasone (FLONASE) 50 MCG/ACT nasal spray Place 2 sprays into both nostrils daily.  . hydrochlorothiazide (HYDRODIURIL) 25 MG tablet Take 25 mg by mouth daily.  . montelukast (SINGULAIR) 10 MG tablet Take 10 mg by mouth at bedtime.  . polyethylene glycol (MIRALAX / GLYCOLAX) packet Take 17 g by mouth daily.  . potassium chloride (MICRO-K) 10 MEQ CR capsule Take 10 mEq by mouth daily.  . Probiotic Product (ALIGN PO) Take 1 capsule by mouth every morning.  . ranitidine (ZANTAC) 300 MG tablet Take 300 mg by mouth at bedtime.  Marland Kitchen rOPINIRole (REQUIP) 2 MG tablet Take 2 mg by mouth at bedtime.  Marland Kitchen zolpidem (AMBIEN) 5 MG tablet   . budesonide-formoterol (SYMBICORT) 160-4.5 MCG/ACT inhaler INHALE TWO PUFFS TWICE DAILY TO PREVENT COUGH OR WHEEZE.RINSE, GARGLE, AND SPIT AFTER USE.  . clonazePAM (KLONOPIN) 1 MG tablet   . ketoconazole (NIZORAL) 2 % cream Apply topically 2 (two) times daily. Infected area  . methylPREDNIsolone (MEDROL DOSPACK) 4 MG tablet follow package directions (Patient not taking: Reported on 11/12/2015)  . sertraline (ZOLOFT) 50 MG tablet Take 50 mg by  mouth daily. In morning   Facility-Administered Encounter Medications as of 11/12/2015  Medication  . triamcinolone acetonide (KENALOG) 10 MG/ML injection 10 mg    Meds ordered this encounter  Medications  . budesonide-formoterol (SYMBICORT) 160-4.5 MCG/ACT inhaler    Sig: INHALE TWO PUFFS TWICE DAILY TO PREVENT COUGH OR WHEEZE.RINSE, GARGLE, AND SPIT AFTER USE.    Dispense:  1 Inhaler    Refill:  5    Past Medical History  Diagnosis Date  . Hypertension   . History  of TIAs AGE 44  AND 2008---  NO RESIDUAL  . GERD (gastroesophageal reflux disease) PT STATES SEVERE---- CONTROLLED W/ NEXIUM  . Allergic rhinitis   . Non-specific gastrointestinal complaint   . Cystocele   . SUI (stress urinary incontinence, female)   . Restless leg syndrome   . Normal echocardiogram 09-14-2007    EF 65%, NORMAL LV  . Rosacea   . Actinic keratosis     Precancerous  . CVA (cerebral infarction)   . OSA (obstructive sleep apnea)     Severe CPAP 12 MM H2O- Dr Radford Pax  . Anxiety   . Degenerative disc disease, cervical     Past Surgical History  Procedure Laterality Date  . Carpal tunnel release  2009    BILATERAL  . Pulley release right thumb  2009  . Vaginal hysterectomy  AGE 12  . Bilateral benign breast bx's  20 YRS AGO  . Noninvasive vascular carotid study  09-14-2007    BILATERAL MILD MIX PLAQUE THROUGHOUT, NO SIGNIFICANT BILATERAL ICA STENOSIS  . Cystocele repair  12/11/2011    Procedure: ANTERIOR REPAIR (CYSTOCELE);  Surgeon: Claybon Jabs, MD;  Location: [facility];  Service: Urology;  Laterality: N/A;  1 1/2 hour requested for case  Anterior repair and mid Urethral Sling  . Pubovaginal sling  12/11/2011    Procedure: Gaynelle Arabian;  Surgeon: Claybon Jabs, MD;  Location: [facility];  Service: Urology;  Laterality: N/A;    Allergies  Allergen Reactions  . Ceftin [Cefuroxime Axetil]     Lack of Therapeutic Effect  . Hydroxyzine Hcl   . Penicillins Hives  . Septra [Sulfamethoxazole-Trimethoprim] Rash    Review of Systems  Constitutional: Negative.   HENT: Positive for congestion, rhinorrhea and sneezing.   Eyes: Negative.   Respiratory: Positive for cough and shortness of breath. Negative for choking, chest tightness, wheezing and stridor.   Cardiovascular: Negative.   Gastrointestinal: Negative.   Musculoskeletal: Negative.   Skin: Negative.   Neurological: Negative.   Hematological: Negative.       Objective:   Filed Vitals:   11/12/15 1212  BP: 152/80  Pulse: 68  Resp: 16   Height: 5' 3.78" (162 cm)  Weight: 193 lb 5.5 oz (87.7 kg)   Physical Exam  Constitutional: She appears well-developed and well-nourished. No distress.  Throat clearing, slight cough  HENT:  Head: Normocephalic and atraumatic. Head is without right periorbital erythema and without left periorbital erythema.  Right Ear: Tympanic membrane, external ear and ear canal normal. No drainage or tenderness. No foreign bodies. Tympanic membrane is not injected, not scarred, not perforated, not erythematous, not retracted and not bulging. No middle ear effusion.  Left Ear: Tympanic membrane, external ear and ear canal normal. No drainage or tenderness. No foreign bodies. Tympanic membrane is not injected, not scarred, not perforated, not erythematous, not retracted and not bulging.  No middle ear effusion.  Nose: Mucosal edema (erythema) present. No rhinorrhea, nose  lacerations or sinus tenderness.  No foreign bodies.  Mouth/Throat: Oropharynx is clear and moist. No oropharyngeal exudate, posterior oropharyngeal edema, posterior oropharyngeal erythema or tonsillar abscesses.  Eyes: Lids are normal. Right eye exhibits no chemosis, no discharge and no exudate. No foreign body present in the right eye. Left eye exhibits no chemosis, no discharge and no exudate. No foreign body present in the left eye. Right conjunctiva is not injected. Left conjunctiva is not injected.  Neck: Neck supple. No tracheal tenderness present. No tracheal deviation and no edema present. No thyroid mass and no thyromegaly present.  Cardiovascular: Normal rate, regular rhythm, S1 normal and S2 normal.  Exam reveals no gallop.   No murmur heard. Pulmonary/Chest: No accessory muscle usage or stridor. No respiratory distress. She has no wheezes. She has no rhonchi. She has no rales.  Abdominal: Soft.  Musculoskeletal: She exhibits no edema.   Lymphadenopathy:       Head (right side): No tonsillar adenopathy present.       Head (left side): No tonsillar adenopathy present.    She has no cervical adenopathy.  Neurological: She is alert.  Skin: No rash noted. She is not diaphoretic.  Psychiatric: She has a normal mood and affect. Her behavior is normal.    Diagnostics:    Spirometry was performed and demonstrated an FEV1 of 2.18 at 96 % of predicted.  The patient had an Asthma Control Test with the following results:  .    Assessment and Plan:   1. Moderate persistent asthma, uncomplicated   2. Allergic rhinoconjunctivitis   3. LPRD (laryngopharyngeal reflux disease)      1. Change Qvar to Symbicort 160 two inhalations two times per day  2. Continue montelukast 10mg  one tablet one time per day  3. For "cold":   A. Nasal saline multiple times per day  B. OTC claritin / allegra / zyrtec one time per day  C. OTC mucinex DM 1-2 tablets two times per day  4. Continue Proair HFA 2 inhalations every 4-6 hours if needed.  5. Continue Nexium 40 2 times per day + Ranitidine 300 in evening  6. Revisit with Dr. Watt Climes  7. Return in 6 weeks or earlier if problem  Rocelia has a obvious respiratory tract infection most likely from viral etiology and we'll treat her conservatively as mentioned above. Of more concern on a long-term basis is the fact that she does have dyspnea on exertion. She has seen Dr. Radford Pax about this issue in the past. If our plan of giving her a combination drug including a inhaled steroid and a long-acting bronchodilator does not result in better improvement regarding her dyspnea on exertion then I think it may be worthwhile to have her undergo evaluation for other etiologic factors contributing to dyspnea on exertion including a cardiac source. We will exercise her in the clinic next visit if she does have significant problems that continue in the face of this therapy. I've encouraged her to follow-up with  Dr. Limmie Patricia regarding her continued sternal pain in the face of utilizing very aggressive therapy for reflux.    Allena Katz, MD [place]

## 2015-11-13 ENCOUNTER — Other Ambulatory Visit: Payer: Self-pay | Admitting: Allergy and Immunology

## 2015-11-13 MED ORDER — PREDNISONE 10 MG PO TABS: ORAL_TABLET | ORAL | Status: DC

## 2015-11-13 NOTE — Telephone Encounter (Signed)
Please inform patient tha she probably has a viral RTI that will take a few days to resolve. She can add prednisone 10mg  one time a day for five days.

## 2015-11-13 NOTE — Telephone Encounter (Signed)
Pt was in recently, yesterday, maybe She has since started feeling badly. Her ear squeaks when she blows her nose and she is having a chest rattlin' cough She has been taking Muc. DM, saline , and Allegra [facility] pls advise

## 2015-11-13 NOTE — Telephone Encounter (Signed)
Dr .Kozlow, please advise.  

## 2015-11-13 NOTE — Telephone Encounter (Signed)
Medication sent to patients pharmacy and notified patient.

## 2015-11-21 ENCOUNTER — Other Ambulatory Visit: Payer: Self-pay | Admitting: Allergy and Immunology

## 2015-11-21 ENCOUNTER — Other Ambulatory Visit: Payer: Self-pay | Admitting: Gastroenterology

## 2015-11-21 DIAGNOSIS — R109 Unspecified abdominal pain: Secondary | ICD-10-CM

## 2015-11-21 MED ORDER — ALBUTEROL SULFATE 108 (90 BASE) MCG/ACT IN AEPB: 2.0000 | INHALATION_SPRAY | RESPIRATORY_TRACT | Status: DC | PRN

## 2015-11-21 NOTE — Telephone Encounter (Signed)
Sent in prescription for Proair. Patient is asking what she could take to help her. She keeps clearing her throat and some slight cough.

## 2015-11-21 NOTE — Telephone Encounter (Signed)
Please inform patient that she needs to replace throat clearing with swallowing or drinking. Every time she clears her throat it keeps it irritated. It will take a big effort to stop throat clearing but she needs to work on this procedure.

## 2015-11-21 NOTE — Telephone Encounter (Signed)
Called and notified patient.

## 2015-11-21 NOTE — Telephone Encounter (Signed)
Pt called and needs a refill on a inhaler but i did not catch what it was. But then she want to let you know that she is having to  clear her throat  A lot, and want to know what to do. If there is something she needs to take.[phone]. [facility] [place].

## 2015-11-29 ENCOUNTER — Ambulatory Visit
Admission: RE | Admit: 2015-11-29 | Discharge: 2015-11-29 | Disposition: A | Discharge status: Home / Self Care | Payer: Medicare PPO | Source: Ambulatory Visit | Attending: Gastroenterology | Admitting: Gastroenterology

## 2015-11-29 DIAGNOSIS — R109 Unspecified abdominal pain: Secondary | ICD-10-CM

## 2015-12-02 ENCOUNTER — Other Ambulatory Visit: Payer: Self-pay | Admitting: Allergy and Immunology

## 2015-12-05 ENCOUNTER — Other Ambulatory Visit: Payer: Self-pay | Admitting: Gastroenterology

## 2015-12-05 DIAGNOSIS — K769 Liver disease, unspecified: Secondary | ICD-10-CM

## 2015-12-09 ENCOUNTER — Other Ambulatory Visit: Payer: Self-pay | Admitting: *Deleted

## 2015-12-09 MED ORDER — MOMETASONE FURO-FORMOTEROL FUM 200-5 MCG/ACT IN AERO: 2.0000 | INHALATION_SPRAY | Freq: Two times a day (BID) | RESPIRATORY_TRACT | Status: DC

## 2015-12-09 NOTE — Telephone Encounter (Signed)
PER DR KOZLOW CHANGED SYMBICORT TO DULERA 200 DUE TO INS PBM CHANGE

## 2015-12-12 ENCOUNTER — Telehealth: Payer: Self-pay | Admitting: Allergy and Immunology

## 2015-12-12 ENCOUNTER — Ambulatory Visit
Admission: RE | Admit: 2015-12-12 | Discharge: 2015-12-12 | Disposition: A | Discharge status: Home / Self Care | Payer: Medicare PPO | Source: Ambulatory Visit | Attending: Gastroenterology | Admitting: Gastroenterology

## 2015-12-12 DIAGNOSIS — K769 Liver disease, unspecified: Secondary | ICD-10-CM

## 2015-12-12 MED ORDER — GADOBENATE DIMEGLUMINE 529 MG/ML IV SOLN
17.0000 mL | Freq: Once | INTRAVENOUS | Status: AC | PRN
  Administered 2015-12-12: 17 mL via INTRAVENOUS

## 2015-12-12 NOTE — Telephone Encounter (Signed)
Please have patient use spacer with symbicort, rinse after use, and decrease dose to two inhalations one time per day.

## 2015-12-12 NOTE — Telephone Encounter (Signed)
Called and left voicemail for patient to return phone call

## 2015-12-12 NOTE — Telephone Encounter (Addendum)
Left spacer at the front for patient to pick up and patient notified.

## 2015-12-12 NOTE — Telephone Encounter (Signed)
Pt called and said that her throat is dry and tight and wants to know if it the flonase symbicort .

## 2015-12-12 NOTE — Telephone Encounter (Signed)
Patient said that she has changed from qvar 40 and is now taking Symbicort. No fever, cough, or wheezing. Patient just having to clear her throat a lot. Patient is asking if she needs to reduce to 1 putt once or twice daily due to the dryness and tightness in her chest.

## 2015-12-12 NOTE — Addendum Note (Signed)
Addended by: Mendel Corning E on: 12/12/2015 02:21 PM   Modules accepted: Medications

## 2015-12-17 ENCOUNTER — Ambulatory Visit: Payer: BC Managed Care – PPO | Admitting: Allergy and Immunology

## 2015-12-24 ENCOUNTER — Encounter: Payer: Self-pay | Admitting: Allergy and Immunology

## 2015-12-24 ENCOUNTER — Other Ambulatory Visit: Payer: Self-pay | Admitting: Allergy and Immunology

## 2015-12-24 ENCOUNTER — Ambulatory Visit: Payer: Self-pay

## 2015-12-24 ENCOUNTER — Ambulatory Visit (INDEPENDENT_AMBULATORY_CARE_PROVIDER_SITE_OTHER): Payer: Medicare PPO | Admitting: Allergy and Immunology

## 2015-12-24 ENCOUNTER — Ambulatory Visit
Admission: RE | Admit: 2015-12-24 | Discharge: 2015-12-24 | Disposition: A | Discharge status: Home / Self Care | Payer: Medicare PPO | Source: Ambulatory Visit | Attending: Allergy and Immunology | Admitting: Allergy and Immunology

## 2015-12-24 VITALS — BP 152/68 | HR 72 | Resp 20

## 2015-12-24 DIAGNOSIS — J4540 Moderate persistent asthma, uncomplicated: Secondary | ICD-10-CM | POA: Diagnosis not present

## 2015-12-24 DIAGNOSIS — R05 Cough: Secondary | ICD-10-CM

## 2015-12-24 DIAGNOSIS — R059 Cough, unspecified: Secondary | ICD-10-CM

## 2015-12-24 DIAGNOSIS — J387 Other diseases of larynx: Secondary | ICD-10-CM

## 2015-12-24 DIAGNOSIS — J309 Allergic rhinitis, unspecified: Secondary | ICD-10-CM | POA: Diagnosis not present

## 2015-12-24 DIAGNOSIS — K219 Gastro-esophageal reflux disease without esophagitis: Secondary | ICD-10-CM

## 2015-12-24 DIAGNOSIS — H1010 Acute atopic conjunctivitis, unspecified eye: Secondary | ICD-10-CM | POA: Diagnosis not present

## 2015-12-24 NOTE — Patient Instructions (Addendum)
  1. Continue Symbicort 160 two inhalations one - two times per day  2. Continue montelukast 10mg  one tablet one time per day and Flonase one spray each nostril one time per day  3. For "cold":   A. Nasal saline multiple times per day  B. OTC claritin / allegra / zyrtec one time per day  C. OTC mucinex DM 1-2 tablets two times per day  4. Continue Proair HFA 2 inhalations every 4-6 hours if needed.  5. Continue Nexium 40 2 times per day + Ranitidine 300 in evening  6. Revisit with Dr. Watt Climes for upper GI endoscopy  7. Visit with ENT to check throat with rhinoscopy  8. Get a chest x-ray  9. Return in 12 weeks or earlier if problem

## 2015-12-24 NOTE — Progress Notes (Signed)
[facility]  Follow-up Note  Referring Provider: Carol Ada, MD Primary Provider: Reginia Naas, MD Date of Office Visit: 12/24/2015  Subjective:   Amy Vang is a 70 y.o. female who returns to the [facility] on 12/24/2015 in re-evaluation of the following:  HPI Comments: Amy Vang returns in reevaluation of her asthma and allergic rhinoconjunctivitis and LPR. Although she is improved with her current medical therapy she still continues to have some problems with intermittent cough and throat clearing. Recently she's had lots of drainage develop an little bit of nasal congestion. Her throat did get irritated while using the Symbicort twice a day and she decreased her dose to 1 time per day. She does not really have any shortness of breath nor does she have any need to use a short acting bronchodilator. Her nose is been doing relatively well without any anosmia or headache or ugly nasal discharge. She still continues to have some issues with reflux and some burning in her chest and had some abdominal issues for which she had a recent abdominal ultrasound which apparently was normal. She is not had her upper endoscopy performed by Dr. Watt Climes yet. Her last upper endoscopy was 5 years ago.   Current Outpatient Prescriptions on File Prior to Visit  Medication Sig Dispense Refill  . albuterol (PROAIR HFA) 108 (90 BASE) MCG/ACT inhaler Reported on 12/12/2015    . Bioflavonoid Products (ESTER C PO) Take 1 tablet by mouth daily.    . Calcium Carbonate-Vitamin D (CALCIUM PLUS VITAMIN D PO) Take 1 tablet by mouth daily.    . citalopram (CELEXA) 20 MG tablet TAKE 1/2 TABLET DAILY FOR 6 DAYS AND THEN INCREASE TO ONE TABLET ONCE A DAY ORALLY 30 DAY(S)  2  . clonazePAM (KLONOPIN) 1 MG tablet     . esomeprazole (NEXIUM) 40 MG capsule Take 40 mg by mouth 2 (two) times daily.    . fish oil-omega-3 fatty acids 1000 MG capsule  Take 2 g by mouth daily.    . fluticasone (FLONASE) 50 MCG/ACT nasal spray USE 2 SPRAYS IN EACH NOSTRIL DAILY 48 g 1  . hydrochlorothiazide (HYDRODIURIL) 25 MG tablet Take 25 mg by mouth daily.    . montelukast (SINGULAIR) 10 MG tablet Take 10 mg by mouth at bedtime.    . polyethylene glycol (MIRALAX / GLYCOLAX) packet Take 17 g by mouth daily.    . potassium chloride (MICRO-K) 10 MEQ CR capsule Take 10 mEq by mouth daily.    . Probiotic Product (ALIGN PO) Take 1 capsule by mouth every morning.    . ranitidine (ZANTAC) 300 MG tablet Take 300 mg by mouth at bedtime.    Marland Kitchen rOPINIRole (REQUIP) 2 MG tablet Take 2 mg by mouth at bedtime.    Marland Kitchen zolpidem (AMBIEN) 5 MG tablet      Current Facility-Administered Medications on File Prior to Visit  Medication Dose Route Frequency Provider Last Rate Last Dose  . triamcinolone acetonide (KENALOG) 10 MG/ML injection 10 mg  10 mg Other Once Trula Slade, DPM        No orders of the defined types were placed in this encounter.    Past Medical History  Diagnosis Date  . Hypertension   . History of TIAs AGE 58  AND 2008---  NO RESIDUAL  . GERD (gastroesophageal reflux disease) PT STATES SEVERE---- CONTROLLED W/ NEXIUM  . Allergic rhinitis   . Non-specific gastrointestinal complaint   .  Cystocele   . SUI (stress urinary incontinence, female)   . Restless leg syndrome   . Normal echocardiogram 09-14-2007    EF 65%, NORMAL LV  . Rosacea   . Actinic keratosis     Precancerous  . CVA (cerebral infarction)   . OSA (obstructive sleep apnea)     Severe CPAP 12 MM H2O- Dr Radford Pax  . Anxiety   . Degenerative disc disease, cervical     Past Surgical History  Procedure Laterality Date  . Carpal tunnel release  2009    BILATERAL  . Pulley release right thumb  2009  . Vaginal hysterectomy  AGE 23  . Bilateral benign breast bx's  20 YRS AGO  . Noninvasive vascular carotid study  09-14-2007    BILATERAL MILD MIX PLAQUE THROUGHOUT, NO SIGNIFICANT  BILATERAL ICA STENOSIS  . Cystocele repair  12/11/2011    Procedure: ANTERIOR REPAIR (CYSTOCELE);  Surgeon: Claybon Jabs, MD;  Location: [facility];  Service: Urology;  Laterality: N/A;  1 1/2 hour requested for case  Anterior repair and mid Urethral Sling  . Pubovaginal sling  12/11/2011    Procedure: Gaynelle Arabian;  Surgeon: Claybon Jabs, MD;  Location: [facility];  Service: Urology;  Laterality: N/A;    Allergies  Allergen Reactions  . Ceftin [Cefuroxime Axetil]     Lack of Therapeutic Effect  . Hydroxyzine Hcl   . Penicillins Hives  . Septra [Sulfamethoxazole-Trimethoprim] Rash    Review of systems negative except as noted in HPI / PMHx or noted below:  Review of Systems  Constitutional: Negative.   HENT: Negative.   Eyes: Negative.   Respiratory: Negative.   Cardiovascular: Negative.   Gastrointestinal: Negative.   Genitourinary: Negative.   Musculoskeletal: Negative.   Skin: Negative.   Neurological: Negative.   Endo/Heme/Allergies: Negative.   Psychiatric/Behavioral: Negative.      Objective:   Filed Vitals:   12/24/15 1130  BP: 152/68  Pulse: 72  Resp: 20          Physical Exam  Constitutional: She is well-developed, well-nourished, and in no distress. No distress.  Throat clearing  HENT:  Head: Normocephalic.  Right Ear: Tympanic membrane, external ear and ear canal normal.  Left Ear: Tympanic membrane, external ear and ear canal normal.  Nose: Nose normal. No mucosal edema or rhinorrhea.  Mouth/Throat: Uvula is midline, oropharynx is clear and moist and mucous membranes are normal. No oropharyngeal exudate.  Eyes: Conjunctivae are normal.  Neck: Trachea normal. No tracheal tenderness present. No tracheal deviation present. No thyromegaly present.  Cardiovascular: Normal rate, regular rhythm, S1 normal, S2 normal and normal heart sounds.   No murmur heard. Pulmonary/Chest: Breath sounds normal. No stridor. No  respiratory distress. She has no wheezes. She has no rales.  Musculoskeletal: She exhibits no edema.  Lymphadenopathy:       Head (right side): No tonsillar adenopathy present.       Head (left side): No tonsillar adenopathy present.    She has no cervical adenopathy.    She has no axillary adenopathy.  Neurological: She is alert. Gait normal.  Skin: No rash noted. She is not diaphoretic. No erythema. Nails show no clubbing.  Psychiatric: Mood and affect normal.    Diagnostics:    Spirometry was performed and demonstrated an FEV1 of 1.98 at 85 % of predicted.  The patient had an Asthma Control Test with the following results:  .    Assessment and Plan:  1. Moderate persistent asthma, uncomplicated   2. Allergic rhinoconjunctivitis   3. LPRD (laryngopharyngeal reflux disease)   4. Cough     1. Continue Symbicort 160 two inhalations one - two times per day with spacing device  2. Continue montelukast 10mg  one tablet one time per day and Flonase one spray shot once a day  3. For "cold":   A. Nasal saline multiple times per day  B. OTC claritin / allegra / zyrtec one time per day  C. OTC mucinex DM 1-2 tablets two times per day  4. Continue Proair HFA 2 inhalations every 4-6 hours if needed.  5. Continue Nexium 40 2 times per day + Ranitidine 300 in evening  6. Revisit with Dr. Watt Climes for upper GI endoscopy  7. Visit with ENT to check throat with rhinoscopy  8. Get a chest x-ray  9. Return in 12 weeks or earlier if problem  I think it's time to have Izora Gala step forward concerning further evaluation of her cough and throat issues. I've asked her to revisit with Dr. Watt Climes to have an upper endoscopy be performed and we will check a chest x-ray as her last chest x-ray was over 14 months ago and will have her throat thoroughly evaluated by an ear nose and throat physician. As this information comes forward to this clinic on the contacting her regarding what direction we need to  go concerning further evaluation and treatment. I think her asthma is actually under pretty good control this point in time and if she can maintain good control while using Symbicort once a day that would be adequate. I suspect that her problem is probably related mostly to throat irritation and inflammation whether that be from reflux or some other source.  Allena Katz, MD [place]

## 2015-12-25 ENCOUNTER — Telehealth: Payer: Self-pay | Admitting: Allergy and Immunology

## 2015-12-25 ENCOUNTER — Telehealth: Payer: Self-pay

## 2015-12-25 NOTE — Telephone Encounter (Signed)
Dr Neldon Mc please advise

## 2015-12-25 NOTE — Telephone Encounter (Signed)
Patient was seen on yesterday , and she for got to inform Dr. Neldon Mc that she had found some mold on some of her plastic ware in her cabinets. She is wondering could this be a cause for her issues. She is a 4 when it comes to mold.  Please Advise  Thanks

## 2015-12-25 NOTE — Telephone Encounter (Signed)
Please inform patient that if there is mold then there is a moisture issue and this needs to be dried up. Maybe a dehumidifier need to be placed in the house to remove any water.

## 2015-12-25 NOTE — Telephone Encounter (Signed)
Spoke to patient and advised per Dr Neldon Mc.

## 2015-12-25 NOTE — Telephone Encounter (Signed)
Called and spoke to patient about chest xray.

## 2015-12-25 NOTE — Telephone Encounter (Signed)
Pt returning [name]'s phone call

## 2016-01-07 ENCOUNTER — Encounter: Payer: Self-pay | Admitting: *Deleted

## 2016-01-07 ENCOUNTER — Other Ambulatory Visit: Payer: Self-pay | Admitting: *Deleted

## 2016-01-07 MED ORDER — MOMETASONE FURO-FORMOTEROL FUM 200-5 MCG/ACT IN AERO: 2.0000 | INHALATION_SPRAY | Freq: Two times a day (BID) | RESPIRATORY_TRACT | Status: DC

## 2016-01-31 ENCOUNTER — Other Ambulatory Visit: Payer: Self-pay | Admitting: Allergy and Immunology

## 2016-03-31 ENCOUNTER — Ambulatory Visit (HOSPITAL_COMMUNITY): Payer: Medicare PPO | Attending: Internal Medicine

## 2016-03-31 ENCOUNTER — Other Ambulatory Visit: Payer: Self-pay

## 2016-03-31 DIAGNOSIS — I10 Essential (primary) hypertension: Secondary | ICD-10-CM | POA: Diagnosis not present

## 2016-03-31 DIAGNOSIS — E669 Obesity, unspecified: Secondary | ICD-10-CM | POA: Diagnosis not present

## 2016-03-31 DIAGNOSIS — I340 Nonrheumatic mitral (valve) insufficiency: Secondary | ICD-10-CM | POA: Insufficient documentation

## 2016-03-31 DIAGNOSIS — Z6832 Body mass index (BMI) 32.0-32.9, adult: Secondary | ICD-10-CM | POA: Insufficient documentation

## 2016-03-31 DIAGNOSIS — G4733 Obstructive sleep apnea (adult) (pediatric): Secondary | ICD-10-CM | POA: Diagnosis not present

## 2016-03-31 DIAGNOSIS — I272 Other secondary pulmonary hypertension: Secondary | ICD-10-CM | POA: Diagnosis present

## 2016-03-31 DIAGNOSIS — I071 Rheumatic tricuspid insufficiency: Secondary | ICD-10-CM | POA: Insufficient documentation

## 2016-04-03 ENCOUNTER — Telehealth: Payer: Self-pay | Admitting: Cardiology

## 2016-04-03 ENCOUNTER — Encounter: Payer: Self-pay | Admitting: Cardiology

## 2016-04-03 ENCOUNTER — Other Ambulatory Visit: Payer: Self-pay | Admitting: *Deleted

## 2016-04-03 DIAGNOSIS — R931 Abnormal findings on diagnostic imaging of heart and coronary circulation: Secondary | ICD-10-CM

## 2016-04-03 DIAGNOSIS — I272 Pulmonary hypertension, unspecified: Secondary | ICD-10-CM

## 2016-04-03 NOTE — Telephone Encounter (Signed)
New message      Pt talked to a nurse this am to get echo results.  The nurse stated she was going to post the results on the portal.  As of now, the results have not been posted.  Pt is calling to remind the nurse to please post echo results on portal

## 2016-04-03 NOTE — Telephone Encounter (Signed)
New message ° ° ° ° ° °Returning a call to the nurse to get echo results °

## 2016-04-03 NOTE — Telephone Encounter (Signed)
Results released

## 2016-04-03 NOTE — Telephone Encounter (Signed)
This encounter was created in error - please disregard.

## 2016-04-14 ENCOUNTER — Other Ambulatory Visit: Payer: Self-pay | Admitting: Cardiology

## 2016-04-14 ENCOUNTER — Encounter (HOSPITAL_COMMUNITY): Payer: Self-pay

## 2016-04-14 ENCOUNTER — Other Ambulatory Visit: Payer: Self-pay | Admitting: *Deleted

## 2016-04-14 ENCOUNTER — Ambulatory Visit (HOSPITAL_COMMUNITY)
Admission: RE | Admit: 2016-04-14 | Discharge: 2016-04-14 | Disposition: A | Discharge status: Home / Self Care | Payer: Medicare PPO | Source: Ambulatory Visit | Attending: Cardiology | Admitting: Cardiology

## 2016-04-14 DIAGNOSIS — R931 Abnormal findings on diagnostic imaging of heart and coronary circulation: Secondary | ICD-10-CM | POA: Insufficient documentation

## 2016-04-14 LAB — POCT I-STAT CREATININE: Creatinine, Ser: 0.7 mg/dL (ref 0.44–1.00)

## 2016-04-14 MED ORDER — IOPAMIDOL (ISOVUE-370) INJECTION 76%
INTRAVENOUS | Status: AC
  Administered 2016-04-14: 100 mL
  Filled 2016-04-14: qty 100

## 2016-04-16 ENCOUNTER — Ambulatory Visit (HOSPITAL_COMMUNITY)
Admission: RE | Admit: 2016-04-16 | Discharge: 2016-04-16 | Disposition: A | Discharge status: Home / Self Care | Payer: Medicare PPO | Source: Ambulatory Visit | Attending: Cardiology | Admitting: Cardiology

## 2016-04-16 DIAGNOSIS — R931 Abnormal findings on diagnostic imaging of heart and coronary circulation: Secondary | ICD-10-CM

## 2016-04-16 DIAGNOSIS — I272 Other secondary pulmonary hypertension: Secondary | ICD-10-CM | POA: Diagnosis present

## 2016-04-16 LAB — PULMONARY FUNCTION TEST
DL/VA % pred: 86 %
DL/VA: 4.27 ml/min/mmHg/L
DLCO UNC % PRED: 64 %
DLCO unc: 16.52 ml/min/mmHg
FEF 25-75 PRE: 2.31 L/s
FEF2575-%PRED-PRE: 113 %
FEV1-%Pred-Pre: 94 %
FEV1-Pre: 2.27 L
FEV1FVC-%PRED-PRE: 104 %
FEV6-%PRED-PRE: 93 %
FEV6-Pre: 2.83 L
FEV6FVC-%Pred-Pre: 104 %
FVC-%Pred-Pre: 89 %
FVC-Pre: 2.85 L
PRE FEV1/FVC RATIO: 80 %
Pre FEV6/FVC Ratio: 100 %

## 2016-04-20 ENCOUNTER — Telehealth: Payer: Self-pay | Admitting: *Deleted

## 2016-04-20 ENCOUNTER — Encounter: Payer: Self-pay | Admitting: Cardiology

## 2016-04-20 ENCOUNTER — Telehealth: Payer: Self-pay | Admitting: Cardiology

## 2016-04-20 DIAGNOSIS — R942 Abnormal results of pulmonary function studies: Secondary | ICD-10-CM

## 2016-04-20 NOTE — Telephone Encounter (Signed)
-----   Message from Sueanne Margarita, MD sent at 04/19/2016  7:40 PM EDT ----- Abnormal PFTs with reduced DLCO - please refer to Dr. Chase Caller for evaluatio.  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

## 2016-04-20 NOTE — Telephone Encounter (Signed)
Pt has been made aware of her breathing test.  A referral has been placed to Pulmonary dr, Dr. Chase Caller  Pt verbalized appreciation and understanding.

## 2016-04-20 NOTE — Telephone Encounter (Signed)
Mrs. Roopnarine is calling to get her test results

## 2016-04-20 NOTE — Telephone Encounter (Signed)
REVIEWED  TEST RESULTS  WITH   PT  PFT AND   CT .Adonis Housekeeper

## 2016-04-22 ENCOUNTER — Encounter: Payer: Self-pay | Admitting: Cardiology

## 2016-04-22 ENCOUNTER — Ambulatory Visit (INDEPENDENT_AMBULATORY_CARE_PROVIDER_SITE_OTHER): Payer: Medicare PPO | Admitting: Cardiology

## 2016-04-22 VITALS — BP 140/62 | HR 84 | Ht 65.0 in | Wt 189.8 lb

## 2016-04-22 DIAGNOSIS — G4733 Obstructive sleep apnea (adult) (pediatric): Secondary | ICD-10-CM | POA: Diagnosis not present

## 2016-04-22 DIAGNOSIS — E669 Obesity, unspecified: Secondary | ICD-10-CM | POA: Diagnosis not present

## 2016-04-22 DIAGNOSIS — I272 Other secondary pulmonary hypertension: Secondary | ICD-10-CM

## 2016-04-22 DIAGNOSIS — R0602 Shortness of breath: Secondary | ICD-10-CM

## 2016-04-22 DIAGNOSIS — I10 Essential (primary) hypertension: Secondary | ICD-10-CM

## 2016-04-22 LAB — CBC WITH DIFFERENTIAL/PLATELET
Basophils Absolute: 0 cells/uL (ref 0–200)
Basophils Relative: 0 %
EOS PCT: 2 %
Eosinophils Absolute: 132 cells/uL (ref 15–500)
HEMATOCRIT: 37.8 % (ref 35.0–45.0)
HEMOGLOBIN: 12.8 g/dL (ref 11.7–15.5)
LYMPHS ABS: 2508 {cells}/uL (ref 850–3900)
LYMPHS PCT: 38 %
MCH: 29.6 pg (ref 27.0–33.0)
MCHC: 33.9 g/dL (ref 32.0–36.0)
MCV: 87.3 fL (ref 80.0–100.0)
MONOS PCT: 10 %
MPV: 9.8 fL (ref 7.5–12.5)
Monocytes Absolute: 660 cells/uL (ref 200–950)
NEUTROS PCT: 50 %
Neutro Abs: 3300 cells/uL (ref 1500–7800)
Platelets: 219 10*3/uL (ref 140–400)
RBC: 4.33 MIL/uL (ref 3.80–5.10)
RDW: 13.2 % (ref 11.0–15.0)
WBC: 6.6 10*3/uL (ref 3.8–10.8)

## 2016-04-22 LAB — SEDIMENTATION RATE: SED RATE: 1 mm/h (ref 0–30)

## 2016-04-22 NOTE — Progress Notes (Signed)
Cardiology Office Note    Date:  04/23/2016   ID:  Amy Vang, DOB 1946-06-30, MRN [id number]  PCP:  Reginia Naas, MD  Cardiologist:  [name], MD   Chief Complaint  Patient presents with  . Sleep Apnea  . Hypertension    History of Present Illness:  Amy Vang is a 70 y.o. female with a history of OSA, obesity and HTN presents today for followup. She denies any  LE edema, dizziness, palpitations or syncope. She has been having some problems with throat clearing, cough and burning in her chest.  She was seen by Dr. Neldon Mc and is on inhalers/Singulair and flonase.  She says that she has had burning in her chest that usually occurs at night but can occur during the day.  She is on a PPI but that has not helped.  2D echo showed normal LVF with diastolic dysfunction, moderate TR and moderate pulmonary HTN.  She underwent chest CT angio which was negative for pulmonary emboli and PFTs showed reduced DLCO at 64% predicted.  She is doing fairly well with her CPAP.  She tolerates the mask and feels the pressure is adequate.  Past Medical History  Diagnosis Date  . Hypertension   . History of TIAs AGE 35  AND 2008---  NO RESIDUAL  . GERD (gastroesophageal reflux disease) PT STATES SEVERE---- CONTROLLED W/ NEXIUM  . Allergic rhinitis   . Non-specific gastrointestinal complaint   . Cystocele   . SUI (stress urinary incontinence, female)   . Restless leg syndrome   . Rosacea   . Actinic keratosis     Precancerous  . CVA (cerebral infarction)   . OSA (obstructive sleep apnea)     Severe CPAP 12 MM H2O- Dr Radford Pax  . Anxiety   . Degenerative disc disease, cervical   . Pulmonary HTN (HCC)     Moderate with PASP 28mmHg by echo 03/2016  . Diastolic dysfunction     Past Surgical History  Procedure Laterality Date  . Carpal tunnel release  2009    BILATERAL  . Pulley release right thumb  2009  . Vaginal hysterectomy  AGE 73  . Bilateral benign breast bx's  20  YRS AGO  . Noninvasive vascular carotid study  09-14-2007    BILATERAL MILD MIX PLAQUE THROUGHOUT, NO SIGNIFICANT BILATERAL ICA STENOSIS  . Cystocele repair  12/11/2011    Procedure: ANTERIOR REPAIR (CYSTOCELE);  Surgeon: Claybon Jabs, MD;  Location: [facility];  Service: Urology;  Laterality: N/A;  1 1/2 hour requested for case  Anterior repair and mid Urethral Sling  . Pubovaginal sling  12/11/2011    Procedure: Gaynelle Arabian;  Surgeon: Claybon Jabs, MD;  Location: [facility];  Service: Urology;  Laterality: N/A;    Current Medications: Outpatient Prescriptions Prior to Visit  Medication Sig Dispense Refill  . Calcium Carbonate-Vitamin D (CALCIUM PLUS VITAMIN D PO) Take 1 tablet by mouth daily.    . citalopram (CELEXA) 20 MG tablet TAKE 1/2 TABLET DAILY FOR 6 DAYS AND THEN INCREASE TO ONE TABLET ONCE A DAY ORALLY 30 DAY(S)  2  . clonazePAM (KLONOPIN) 1 MG tablet Take 1 mg by mouth daily.     Marland Kitchen esomeprazole (NEXIUM) 40 MG capsule Take 40 mg by mouth 2 (two) times daily.    . fish oil-omega-3 fatty acids 1000 MG capsule Take 2 g by mouth daily.    . fluticasone (FLONASE) 50 MCG/ACT nasal spray USE 2  SPRAYS IN EACH NOSTRIL DAILY 48 g 1  . hydrochlorothiazide (HYDRODIURIL) 25 MG tablet Take 25 mg by mouth daily.    . montelukast (SINGULAIR) 10 MG tablet Take 10 mg by mouth at bedtime.    . polyethylene glycol (MIRALAX / GLYCOLAX) packet Take 17 g by mouth daily.    . potassium chloride (MICRO-K) 10 MEQ CR capsule Take 10 mEq by mouth daily.    . ranitidine (ZANTAC) 300 MG tablet TAKE 1 TABLET DAILY AS DIRECTED FOR REFLUX 90 tablet 0  . rOPINIRole (REQUIP) 2 MG tablet Take 2 mg by mouth at bedtime.    Marland Kitchen zolpidem (AMBIEN) 5 MG tablet Take 5 mg by mouth at bedtime.     Marland Kitchen albuterol (PROAIR HFA) 108 (90 BASE) MCG/ACT inhaler Reported on 04/22/2016    . Bioflavonoid Products (ESTER C PO) Take 1 tablet by mouth daily. Reported on 04/22/2016    .  mometasone-formoterol (DULERA) 200-5 MCG/ACT AERO Inhale 2 puffs into the lungs 2 (two) times daily. (Patient not taking: Reported on 04/22/2016) 1 Inhaler 5  . Probiotic Product (ALIGN PO) Take 1 capsule by mouth every morning. Reported on 04/22/2016     Facility-Administered Medications Prior to Visit  Medication Dose Route Frequency Provider Last Rate Last Dose  . triamcinolone acetonide (KENALOG) 10 MG/ML injection 10 mg  10 mg Other Once Trula Slade, DPM         Allergies:   Ceftin; Hydroxyzine hcl; Penicillins; and Septra   Social History   Social History  . Marital Status: Married    Spouse Name: N/A  . Number of Children: N/A  . Years of Education: N/A   Social History Main Topics  . Smoking status: Never Smoker   . Smokeless tobacco: Never Used  . Alcohol Use: No  . Drug Use: No  . Sexual Activity: Not Asked   Other Topics Concern  . None   Social History Narrative     Family History:  The patient's family history includes Alzheimer's disease in her mother; CAD in her brother; Colon cancer in her maternal aunt; Diabetes in her brother, mother, and sister; Heart attack in her brother and paternal grandfather; Hypertension in her sister; Leukemia in her brother; Stroke in her mother and paternal grandmother. She was adopted.   ROS:   Please see the history of present illness.    ROS All other systems reviewed and are negative.   PHYSICAL EXAM:   VS:  BP 140/62 mmHg  Pulse 84  Ht 5\' 5"  (1.651 m)  Wt 189 lb 12.8 oz (86.093 kg)  BMI 31.58 kg/m2   GEN: Well nourished, well developed, in no acute distress HEENT: normal Neck: no JVD, carotid bruits, or masses Cardiac: RRR; no murmurs, rubs, or gallops,no edema.  Intact distal pulses bilaterally.  Respiratory:  clear to auscultation bilaterally, normal work of breathing GI: soft, nontender, nondistended, + BS MS: no deformity or atrophy Skin: warm and dry, no rash Neuro:  Alert and Oriented x 3, Strength and  sensation are intact Psych: euthymic mood, full affect  Wt Readings from Last 3 Encounters:  04/22/16 189 lb 12.8 oz (86.093 kg)  12/12/15 185 lb (83.915 kg)  11/12/15 193 lb 5.5 oz (87.7 kg)      Studies/Labs Reviewed:   EKG:  EKG is not ordered today.    Recent Labs: 04/22/2016: Brain Natriuretic Peptide 26.0; BUN 8; Creat 0.66; Hemoglobin 12.8; Platelets 219; Potassium 3.6; Sodium 140; TSH 1.46   Lipid Panel  Component Value Date/Time   CHOL 142 04/12/2012 0740   TRIG 192* 04/12/2012 0740   HDL 29* 04/12/2012 0740   CHOLHDL 4.9 04/12/2012 0740   VLDL 38 04/12/2012 0740   LDLCALC 75 04/12/2012 0740    Additional studies/ records that were reviewed today include:  none    ASSESSMENT:    1. OSA (obstructive sleep apnea)   2. Benign essential HTN   3. Obesity   4. Shortness of breath   5. Pulmonary HTN ([place])      PLAN:  In order of problems listed above:  OSA - the patient is tolerating PAP therapy well without any problems. I will get a download from her PCP. The patient has been using and benefiting from CPAP use and will continue to benefit from therapy.  HTN - well controlled.  Continue diuretic Obesity - I have encouraged him to get into a routine exercise program and cut back on carbs and portions.  Moderate pulmonary HTN ? Etiology - her PFTs were c/w pulmonary vascular process.  I will check an ANA, sed rate and RF and refer her to Dr. Chase Caller for further workup.  She does have OSA but this is adequately treated and PA pressures have continued to increase and now has abnormal PFTs.  May need to consider RHC.  Will repeat echo in 6 months.   Diastolic dysfunction on echo - will check BNP but chest CT did not show any volume overload.     Medication Adjustments/Labs and Tests Ordered: Current medicines are reviewed at length with the patient today.  Concerns regarding medicines are outlined above.  Medication changes, Labs and Tests ordered today are  listed in the Patient Instructions below.  Patient Instructions  Medication Instructions:  Your physician recommends that you continue on your current medications as directed. Please refer to the Current Medication list given to you today.   Labwork: TODAY: BNP, Sed rate, ANA, RF  Testing/Procedures: Your physician has requested that you have a lexiscan myoview. For further information please visit [email or web address]. Please follow instruction sheet, as given.  Follow-Up: Your physician wants you to follow-up in: 6 months with Dr. Radford Pax. You will receive a reminder letter in the mail two months in advance. If you don't receive a letter, please call our office to schedule the follow-up appointment.   Any Other Special Instructions Will Be Listed Below (If Applicable). Please bring your download card to your appointment when you come for your stress test.    If you need a refill on your cardiac medications before your next appointment, please call your pharmacy.       Signed, [name], MD  04/23/2016 8:38 PM    [address], [place], [place]  [address] Phone: [phone]; Fax: [phone]

## 2016-04-22 NOTE — Patient Instructions (Addendum)
Medication Instructions:  Your physician recommends that you continue on your current medications as directed. Please refer to the Current Medication list given to you today.   Labwork: TODAY: BNP, Sed rate, ANA, RF  Testing/Procedures: Your physician has requested that you have a lexiscan myoview. For further information please visit [email or web address]. Please follow instruction sheet, as given.  Follow-Up: Your physician wants you to follow-up in: 6 months with Dr. Radford Pax. You will receive a reminder letter in the mail two months in advance. If you don't receive a letter, please call our office to schedule the follow-up appointment.   Any Other Special Instructions Will Be Listed Below (If Applicable). Please bring your download card to your appointment when you come for your stress test.    If you need a refill on your cardiac medications before your next appointment, please call your pharmacy.

## 2016-04-23 ENCOUNTER — Telehealth: Payer: Self-pay | Admitting: Cardiology

## 2016-04-23 ENCOUNTER — Encounter: Payer: Self-pay | Admitting: Cardiology

## 2016-04-23 DIAGNOSIS — I272 Pulmonary hypertension, unspecified: Secondary | ICD-10-CM | POA: Insufficient documentation

## 2016-04-23 LAB — BASIC METABOLIC PANEL
BUN: 8 mg/dL (ref 7–25)
CALCIUM: 8.6 mg/dL (ref 8.6–10.4)
CO2: 26 mmol/L (ref 20–31)
CREATININE: 0.66 mg/dL (ref 0.50–0.99)
Chloride: 99 mmol/L (ref 98–110)
GLUCOSE: 115 mg/dL — AB (ref 65–99)
Potassium: 3.6 mmol/L (ref 3.5–5.3)
SODIUM: 140 mmol/L (ref 135–146)

## 2016-04-23 LAB — TSH: TSH: 1.46 mIU/L

## 2016-04-23 LAB — RHEUMATOID FACTOR: Rhuematoid fact SerPl-aCnc: <10 IU/mL (ref <=14)

## 2016-04-23 LAB — ANA: Anti Nuclear Antibody(ANA): NEGATIVE

## 2016-04-23 LAB — BRAIN NATRIURETIC PEPTIDE: BRAIN NATRIURETIC PEPTIDE: 26 pg/mL (ref <100)

## 2016-04-23 NOTE — Telephone Encounter (Signed)
Please order echo for 6 months to followup on pulmonary HTN

## 2016-04-24 NOTE — Addendum Note (Signed)
Addended by: Harland German A on: 04/24/2016 02:19 PM   Modules accepted: Orders

## 2016-04-24 NOTE — Telephone Encounter (Signed)
Recall deleted for ECHO to be scheduled in 1 year. ECHO ordered to be scheduled in 6 months.

## 2016-04-27 ENCOUNTER — Encounter: Payer: Self-pay | Admitting: Cardiology

## 2016-04-28 ENCOUNTER — Telehealth (HOSPITAL_COMMUNITY): Payer: Self-pay | Admitting: *Deleted

## 2016-04-28 ENCOUNTER — Other Ambulatory Visit: Payer: Self-pay

## 2016-04-28 DIAGNOSIS — G4733 Obstructive sleep apnea (adult) (pediatric): Secondary | ICD-10-CM

## 2016-04-28 NOTE — Telephone Encounter (Signed)
Patient given detailed instructions per Myocardial Perfusion Study Information Sheet for the test on 04/30/16 at 1000. Patient notified to arrive 15 minutes early and that it is imperative to arrive on time for appointment to keep from having the test rescheduled.  If you need to cancel or reschedule your appointment, please call the office within 24 hours of your appointment. Failure to do so may result in a cancellation of your appointment, and a $50 no show fee. Patient verbalized understanding.Shauntee Karp W    

## 2016-04-29 ENCOUNTER — Encounter: Payer: Self-pay | Admitting: Cardiology

## 2016-04-30 ENCOUNTER — Telehealth: Payer: Self-pay | Admitting: Cardiology

## 2016-04-30 ENCOUNTER — Ambulatory Visit (HOSPITAL_COMMUNITY): Payer: Medicare PPO | Attending: Cardiology

## 2016-04-30 DIAGNOSIS — R0602 Shortness of breath: Secondary | ICD-10-CM | POA: Diagnosis present

## 2016-04-30 DIAGNOSIS — I10 Essential (primary) hypertension: Secondary | ICD-10-CM | POA: Insufficient documentation

## 2016-04-30 LAB — MYOCARDIAL PERFUSION IMAGING
CHL CUP NUCLEAR SSS: 15
CSEPPHR: 95 {beats}/min
LHR: 0.3
LVDIAVOL: 73 mL (ref 46–106)
LVSYSVOL: 20 mL
Rest HR: 67 {beats}/min
SDS: 8
SRS: 7
TID: 1.19

## 2016-04-30 MED ORDER — REGADENOSON 0.4 MG/5ML IV SOLN
0.4000 mg | Freq: Once | INTRAVENOUS | Status: AC
  Administered 2016-04-30: 0.4 mg via INTRAVENOUS

## 2016-04-30 MED ORDER — TECHNETIUM TC 99M TETROFOSMIN IV KIT
10.4000 | PACK | Freq: Once | INTRAVENOUS | Status: AC | PRN
  Administered 2016-04-30: 10 via INTRAVENOUS
  Filled 2016-04-30: qty 10

## 2016-04-30 MED ORDER — TECHNETIUM TC 99M TETROFOSMIN IV KIT
32.2000 | PACK | Freq: Once | INTRAVENOUS | Status: AC | PRN
  Administered 2016-04-30: 32.2 via INTRAVENOUS
  Filled 2016-04-30: qty 32

## 2016-04-30 NOTE — Telephone Encounter (Signed)
New Message  Pt states she drop of monitor this morning 6/1 at the front desk; she wanted to get her results read and also wanted to know if the monitor had been received. She requested a call back for confirmation of the monitor being given to the correct person. Please call back to discuss

## 2016-04-30 NOTE — Telephone Encounter (Signed)
Continuation of note below:   Download was done shortly after 1:00, and the card was returned to the front desk for patient to pick up. patient states that she called at 2:00 to see if it was ready and came by and was told no, that it wasn't ready.  She doesn't remember who she talked to, but this call did not come to me.    Patient is now aware that download is complete and her card is ready for pick up.

## 2016-04-30 NOTE — Telephone Encounter (Signed)
Patient aware that her download has been printed and I will let her know the results as soon as Dr. Radford Pax reads the download.  Her card is at the front desk and ready for pick up.  Patient stated that she would come by and get it.

## 2016-05-01 ENCOUNTER — Encounter: Payer: Self-pay | Admitting: *Deleted

## 2016-05-01 ENCOUNTER — Encounter: Payer: Self-pay | Admitting: Cardiology

## 2016-05-01 ENCOUNTER — Telehealth: Payer: Self-pay

## 2016-05-01 NOTE — Telephone Encounter (Signed)
Called patient to clarify results since multiple MyChart messages were received today. See 6/2 emails for  Informed patient that MyChart is not controlled by [facility], and she may need to consult customer support with help. She st she changed her preferences and can now receive MyChart messages. Informed patient of myoview results and verbal understanding expressed.   Informed patient that she will be called once Dr. Radford Pax reviews her download. Reiterated to her that Dr. Radford Pax is not in the office and she will probably be called next week with results. Patient was grateful for call.

## 2016-05-08 ENCOUNTER — Encounter: Payer: Self-pay | Admitting: Cardiology

## 2016-05-11 ENCOUNTER — Encounter: Payer: Self-pay | Admitting: Cardiology

## 2016-05-12 ENCOUNTER — Encounter: Payer: Self-pay | Admitting: Cardiology

## 2016-05-18 ENCOUNTER — Telehealth: Payer: Self-pay | Admitting: Allergy and Immunology

## 2016-05-18 MED ORDER — ALBUTEROL SULFATE HFA 108 (90 BASE) MCG/ACT IN AERS: 2.0000 | INHALATION_SPRAY | RESPIRATORY_TRACT | Status: DC | PRN

## 2016-05-18 NOTE — Telephone Encounter (Signed)
ADVISED PT 1 PROAIR SENT TO RX AND NEEDS TO MAKE APPT. EXPLAINED TO PT WHAT PROAIR WAS FOR AND WILL NOT WORK LIKE INHALED CORTICOSTEROIDS TO PREVENT ASTHMA

## 2016-05-18 NOTE — Telephone Encounter (Signed)
PT CALLED AND WANTS TO HAVE PROAIR CALLED IN. SHE DOES NOT USE SYMBICORT BECAUSE IT MAKE HER THROAT FEEL LIKE IT IS SWELLING AND THE QVAR MAKES HER SWEAT. [facility] IN [place].

## 2016-05-21 ENCOUNTER — Encounter: Payer: Self-pay | Admitting: Internal Medicine

## 2016-05-21 ENCOUNTER — Ambulatory Visit (INDEPENDENT_AMBULATORY_CARE_PROVIDER_SITE_OTHER): Payer: Medicare PPO | Admitting: Internal Medicine

## 2016-05-21 VITALS — BP 146/82 | HR 59 | Temp 98.3°F | Ht 65.0 in | Wt 191.4 lb

## 2016-05-21 DIAGNOSIS — R062 Wheezing: Secondary | ICD-10-CM

## 2016-05-21 DIAGNOSIS — R0689 Other abnormalities of breathing: Secondary | ICD-10-CM | POA: Diagnosis not present

## 2016-05-21 DIAGNOSIS — R198 Other specified symptoms and signs involving the digestive system and abdomen: Secondary | ICD-10-CM | POA: Diagnosis not present

## 2016-05-21 DIAGNOSIS — R6889 Other general symptoms and signs: Secondary | ICD-10-CM | POA: Insufficient documentation

## 2016-05-21 DIAGNOSIS — R0989 Other specified symptoms and signs involving the circulatory and respiratory systems: Secondary | ICD-10-CM | POA: Insufficient documentation

## 2016-05-21 DIAGNOSIS — R0600 Dyspnea, unspecified: Secondary | ICD-10-CM | POA: Diagnosis not present

## 2016-05-21 LAB — NITRIC OXIDE

## 2016-05-21 NOTE — Patient Instructions (Addendum)
1. Dyspnea and respiratory abnormality 2. Wheeze - no evidence of asthma - not sure why you are having symptoms but suspect diastolic dysfunction and elevated pulmonary pressures  - hold off symbicort for now - do CPST bike test next few weeks anytime; depending on results might need bike stress test     3. 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

## 2016-05-21 NOTE — Progress Notes (Signed)
Subjective:     Patient ID: Amy Vang, female   DOB: 04-04-1946, 70 y.o.   MRN: [id number]  PC Reginia Naas, MD Referred by Dr [name] of cardiology  HPI  IOV 05/21/2016  Chief Complaint  Patient presents with  . Pulmonary Consult    Ref. by Dr. Sabino Snipes. PFT last mth.@[facility] .Sob with exertion x 1 yr. Humidity makes worse, wheezing occass.,dry cough occass., clears throat a lot, hoarseness,denies cp or tightness.Wears CPAP each night doing well.     70 year old female accompanied by her husband. Referred by Dr. Radford Pax for shortness of breath and wheezing evaluation. History obtained from patient, husband and review of the chart  She has chronic throat clearing. Last 4 years. Also for the last 4 years she's been on CPAP for obstructive sleep apnea which has been helping her. Reports that in 2015/2016 her husband had pneumonia and at the same time she had acute bronchitis. After that she's had chronic wheezing for which she was on Qvar and has a diagnosis of asthma. Is being treated by Dr. Neldon Mc at allergy clinic. However the Qvar did not help her. In addition it made her have diaphoresis saw a few months ago she stopped it and started taking Symbicort which also did not help and now for the last 8 days she is not taking any inhaler. Throughout this time her chronic throat clearing is unchanged and persistent and moderate. Her main symptoms in the last year and half since the diagnosis of asthma is one of dyspnea and wheezing. Her dyspnea is of onset since the restaurant illness. It is present on exertion such as walking 3 laps in our office or climbing stairs. It is relieved by rest. The inhalers are not helping her. It is associated with wheezing. Pollen also makes a wheeze but otherwise no exacerbating factors. Albuterol helps. Symptoms and she takes it at least twice a week. There are no nocturnal symptoms no nocturnal awakening. No orthopnea or paroxysmal nocturnal  dyspnea.  There is concern that she has elevated pulmonary artery systolic pressure and diastolic dysfunction and is documented in a series of tests below. She's now been referred to pulmonary because of low diffusion capacity and elevated pulmonary artery systolic pressure on echo    Laboratory data so far  Echocardiogram 03/31/2016 left ventricular hypertrophy with grade 1 diastolic dysfunction and elevated pulmonary artery systolic pressure 46 mmHg and increased compared to one year ago-  - creatinine 0.66 mg 04/22/2016 normal - Hemoglobin 12.8 g percent 04/22/2016 - Sedimentation rate of 1 04/22/2016 and normal ANA and rheumatoid factor -  normal BNP 04/22/2016 - CT angiogram chest 04/14/2016 no evidence of pulmonary embolism and normal lung parenchyma personally visualized this test  - Pulmonary function test 04/16/2016 normal but with slightly reduced effusion capacity of 16.5/64% - Nuclear medicine myocardial perfusion test 04/30/2016 normal with an ejection fraction of 71%   - Walking desaturation test 185 feet 3 laps on room air in the office 05/21/2016 : Got dyspneic and the third lap but did not desaturate below 94% (started off at 94%). Restng HR 60 and pk HR 103/min  - Exhaled NO 05/21/2016 - 13 ppb (off symbicoxrt x 8 days)    has a past medical history of Hypertension; History of TIAs (AGE 75  AND 2008---  NO RESIDUAL); GERD (gastroesophageal reflux disease) (PT STATES SEVERE---- CONTROLLED W/ NEXIUM); Allergic rhinitis; Non-specific gastrointestinal complaint; Cystocele; SUI (stress urinary incontinence, female); Restless leg syndrome; Rosacea; Actinic keratosis; CVA (cerebral  infarction); OSA (obstructive sleep apnea); Anxiety; Degenerative disc disease, cervical; Pulmonary HTN ([facility]); Diastolic dysfunction; Acid reflux; and Asthma.   reports that she has never smoked. She has never used smokeless tobacco.  Past Surgical History  Procedure Laterality Date  . Carpal tunnel  release  2009    BILATERAL  . Pulley release right thumb  2009  . Vaginal hysterectomy  AGE 67  . Bilateral benign breast bx's  20 YRS AGO  . Noninvasive vascular carotid study  09-14-2007    BILATERAL MILD MIX PLAQUE THROUGHOUT, NO SIGNIFICANT BILATERAL ICA STENOSIS  . Cystocele repair  12/11/2011    Procedure: ANTERIOR REPAIR (CYSTOCELE);  Surgeon: Claybon Jabs, MD;  Location: [facility];  Service: Urology;  Laterality: N/A;  1 1/2 hour requested for case  Anterior repair and mid Urethral Sling  . Pubovaginal sling  12/11/2011    Procedure: Gaynelle Arabian;  Surgeon: Claybon Jabs, MD;  Location: [facility];  Service: Urology;  Laterality: N/A;    Allergies  Allergen Reactions  . Ceftin [Cefuroxime Axetil]     Lack of Therapeutic Effect  . Hydroxyzine Hcl   . Penicillins Hives  . Septra [Sulfamethoxazole-Trimethoprim] Rash    Immunization History  Administered Date(s) Administered  . Influenza Split 08/22/2015    Family History  Problem Relation Age of Onset  . Adopted: Yes  . Diabetes Mother   . Stroke Mother   . Alzheimer's disease Mother   . Hypertension Sister   . Diabetes Sister   . Heart attack Brother   . Leukemia Brother   . CAD Brother   . Diabetes Brother   . Colon cancer Maternal Aunt   . Stroke Paternal Grandmother   . Heart attack Paternal Grandfather      Current outpatient prescriptions:  .  albuterol (PROAIR HFA) 108 (90 Base) MCG/ACT inhaler, Inhale 2 puffs into the lungs every 4 (four) hours as needed for wheezing or shortness of breath., Disp: 1 Inhaler, Rfl: 0 .  Calcium Carbonate-Vitamin D (CALCIUM PLUS VITAMIN D PO), Take 1 tablet by mouth daily., Disp: , Rfl:  .  citalopram (CELEXA) 20 MG tablet, TAKE 1/2 TABLET DAILY FOR 6 DAYS AND THEN INCREASE TO ONE TABLET ONCE A DAY ORALLY 30 DAY(S), Disp: , Rfl: 2 .  clonazePAM (KLONOPIN) 1 MG tablet, Take 1 mg by mouth daily. , Disp: , Rfl:  .  esomeprazole  (NEXIUM) 40 MG capsule, Take 40 mg by mouth 2 (two) times daily., Disp: , Rfl:  .  fish oil-omega-3 fatty acids 1000 MG capsule, Take 2 g by mouth daily., Disp: , Rfl:  .  fluticasone (FLONASE) 50 MCG/ACT nasal spray, USE 2 SPRAYS IN EACH NOSTRIL DAILY, Disp: 48 g, Rfl: 1 .  hydrochlorothiazide (HYDRODIURIL) 25 MG tablet, Take 25 mg by mouth daily., Disp: , Rfl:  .  montelukast (SINGULAIR) 10 MG tablet, Take 10 mg by mouth at bedtime., Disp: , Rfl:  .  oxyCODONE-acetaminophen (PERCOCET/ROXICET) 5-325 MG tablet, Take 1 tablet by mouth 2 (two) times daily as needed. (pain), Disp: , Rfl: 0 .  polyethylene glycol (MIRALAX / GLYCOLAX) packet, Take 17 g by mouth daily., Disp: , Rfl:  .  potassium chloride (MICRO-K) 10 MEQ CR capsule, Take 10 mEq by mouth daily., Disp: , Rfl:  .  ranitidine (ZANTAC) 300 MG tablet, TAKE 1 TABLET DAILY AS DIRECTED FOR REFLUX, Disp: 90 tablet, Rfl: 0 .  rOPINIRole (REQUIP) 2 MG tablet, Take 2 mg by mouth  at bedtime., Disp: , Rfl:  .  zolpidem (AMBIEN) 5 MG tablet, Take 5 mg by mouth at bedtime. , Disp: , Rfl:  .  beclomethasone (QVAR) 40 MCG/ACT inhaler, Inhale 2 puffs into the lungs daily. Reported on 05/21/2016, Disp: , Rfl:   Current facility-administered medications:  .  triamcinolone acetonide (KENALOG) 10 MG/ML injection 10 mg, 10 mg, Other, Once, Trula Slade, DPM     Review of Systems  Constitutional: Positive for fatigue. Negative for fever.  HENT: Positive for congestion, postnasal drip, rhinorrhea, sinus pressure, sneezing, sore throat and trouble swallowing. Negative for dental problem, ear pain and nosebleeds.   Eyes: Positive for redness and itching.  Respiratory: Positive for shortness of breath and wheezing. Negative for cough.   Cardiovascular: Positive for palpitations. Negative for leg swelling.  Gastrointestinal: Negative for nausea and vomiting.  Genitourinary: Negative for dysuria.  Musculoskeletal: Negative for joint swelling.  Skin:  Negative for rash.  Neurological: Positive for headaches.  Hematological: Does not bruise/bleed easily.  Psychiatric/Behavioral: Negative for dysphoric mood. The patient is not nervous/anxious.        Objective:   Physical Exam  Constitutional: She is oriented to person, place, and time. She appears well-developed and well-nourished. No distress.  obese  HENT:  Head: Normocephalic and atraumatic.  Right Ear: External ear normal.  Left Ear: External ear normal.  Mouth/Throat: Oropharynx is clear and moist. No oropharyngeal exudate.  Clears throaat a few times Mallampatti class 3-4  Eyes: Conjunctivae and EOM are normal. Pupils are equal, round, and reactive to light. Right eye exhibits no discharge. Left eye exhibits no discharge. No scleral icterus.  Neck: Normal range of motion. Neck supple. No JVD present. No tracheal deviation present. No thyromegaly present.  Cardiovascular: Normal rate, regular rhythm, normal heart sounds and intact distal pulses.  Exam reveals no gallop and no friction rub.   No murmur heard. Pulmonary/Chest: Effort normal and breath sounds normal. No respiratory distress. She has no wheezes. She has no rales. She exhibits no tenderness.  Abdominal: Soft. Bowel sounds are normal. She exhibits no distension and no mass. There is no tenderness. There is no rebound and no guarding.  Visceral obesity +  Musculoskeletal: Normal range of motion. She exhibits no edema or tenderness.  Lymphadenopathy:    She has no cervical adenopathy.  Neurological: She is alert and oriented to person, place, and time. She has normal reflexes. No cranial nerve deficit. She exhibits normal muscle tone. Coordination normal.  Skin: Skin is warm and dry. No rash noted. She is not diaphoretic. No erythema. No pallor.  Psychiatric: She has a normal mood and affect. Her behavior is normal. Judgment and thought content normal.  Vitals reviewed.   Filed Vitals:   05/21/16 0918  BP: 146/82   Pulse: 59  Temp: 98.3 F (36.8 C)  TempSrc: Oral  Height: 5\' 5"  (1.651 m)  Weight: 191 lb 6.4 oz (86.818 kg)  SpO2: 96%   Estimated body mass index is 31.85 kg/(m^2) as calculated from the following:   Height as of this encounter: 5\' 5"  (1.651 m).   Weight as of this encounter: 191 lb 6.4 oz (86.818 kg).      Assessment:       ICD-9-CM ICD-10-CM   1. Dyspnea and respiratory abnormality 786.09 R06.00     R06.89   2. Wheeze 786.07 R06.2   3. Chronic throat clearing 784.99 R19.8          Plan:  1. Dyspnea and respiratory abnormality 2. Wheeze - no evidence of asthma - not sure why you are having symptoms but suspect diastolic dysfunction and elevated pulmonary pressures  - hold off symbicort for now - do CPST bike test next few weeks anytime; depending on results might need bike stress test     3. 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 Brand Males, M.D., [facility].C.P Pulmonary and Critical Care Medicine Staff Physician [facility] Pager: [phone], If no answer or between  15:00h - 7:00h: call [phone]  05/21/2016 10:15 AM

## 2016-05-26 ENCOUNTER — Ambulatory Visit (HOSPITAL_COMMUNITY): Payer: Medicare PPO | Attending: Internal Medicine

## 2016-05-26 DIAGNOSIS — R0689 Other abnormalities of breathing: Secondary | ICD-10-CM

## 2016-05-26 DIAGNOSIS — R0989 Other specified symptoms and signs involving the circulatory and respiratory systems: Secondary | ICD-10-CM

## 2016-05-26 DIAGNOSIS — R062 Wheezing: Secondary | ICD-10-CM

## 2016-05-26 DIAGNOSIS — R6889 Other general symptoms and signs: Secondary | ICD-10-CM

## 2016-05-26 DIAGNOSIS — R0600 Dyspnea, unspecified: Secondary | ICD-10-CM | POA: Insufficient documentation

## 2016-05-28 ENCOUNTER — Ambulatory Visit: Payer: BC Managed Care – PPO | Admitting: Allergy and Immunology

## 2016-06-01 DIAGNOSIS — R0600 Dyspnea, unspecified: Secondary | ICD-10-CM | POA: Diagnosis not present

## 2016-06-10 ENCOUNTER — Ambulatory Visit (INDEPENDENT_AMBULATORY_CARE_PROVIDER_SITE_OTHER): Payer: Medicare PPO | Admitting: Acute Care

## 2016-06-10 ENCOUNTER — Encounter: Payer: Self-pay | Admitting: Acute Care

## 2016-06-10 VITALS — BP 124/80 | HR 57 | Ht 65.0 in | Wt 192.0 lb

## 2016-06-10 DIAGNOSIS — R198 Other specified symptoms and signs involving the digestive system and abdomen: Secondary | ICD-10-CM | POA: Diagnosis not present

## 2016-06-10 DIAGNOSIS — R0602 Shortness of breath: Secondary | ICD-10-CM

## 2016-06-10 DIAGNOSIS — I272 Other secondary pulmonary hypertension: Secondary | ICD-10-CM | POA: Diagnosis not present

## 2016-06-10 DIAGNOSIS — G4733 Obstructive sleep apnea (adult) (pediatric): Secondary | ICD-10-CM

## 2016-06-10 DIAGNOSIS — R0989 Other specified symptoms and signs involving the circulatory and respiratory systems: Secondary | ICD-10-CM

## 2016-06-10 DIAGNOSIS — R6889 Other general symptoms and signs: Secondary | ICD-10-CM

## 2016-06-10 NOTE — Assessment & Plan Note (Signed)
   Chronic throat clearing Plan: Add sugar free Jolly Ranchers for throat soothing. Sips of water instead of throat clearing  Avoid chocolate, mint and menthol. Follow up with Dr. Chase Caller in 3 months

## 2016-06-10 NOTE — Assessment & Plan Note (Addendum)
Compliant with CPAP treatment Plan Continue on CPAP at bedtime. You appear to be benefiting from the treatment Goal is to wear for at least 4-6 hours each night for maximal clinical benefit. Continue to work on weight loss, as the link between excess weight  and sleep apnea is well established.  Do not drive if sleepy. Follow up with Ramaswamy in 3 months. Please contact office for sooner follow up if symptoms do not improve or worsen or seek emergency care

## 2016-06-10 NOTE — Progress Notes (Signed)
History of Present Illness Amy Vang is a 70 y.o. female with OSA on CPAP, moderate persistent asthma, seasonal allergies and LPRD,   7/12/2017Follow Up for CPST  Pt. Presents for results of her CPST.I explained that the study was essentially normal.We discussed that her dyspnea is most likely related to her weight and deconditioning. She states since she and her husband were sick this fall she has not exercised much.We discussed the option of Pulmonary Rehab. She likes the idea of exercising in a controlled environment with medical staff to monitor. We discussed her cough and possible interventions to assist in its management. She states she is compliant with her CPAP every night. She denies chest pain, fever, purulent secretions, orthopnea or hemoptysis.   Tests CPST 05/26/2016  Conclusion: Exercise testing with gas-exchange demonstrates normal functional capacity when compared to matched sedentary norms. There is no evidence of cardiopulmonary limitation or exercise-induced bronchospasm. Patient appears primarily limited due to her body habitus. 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  Resting HR: 72 Peak HR: 121  (80% age predicted max HR)  Pre-Exercise PFTs   FVC 2.76 (91%)     FEV1 2.07 (89%)      FEV1/FVC 75 (96%)      MVV 80 (89%)  Post-Exercise PFTs ((from lowest post-exercise trial (%change from rest)) FVC performed IPE, 5, 10, 15 mins  FVC 2.71 (-1%) 15 mins     FEV1 2.02 (-2%) 15 mins      FEV1/FVC 64 (-15%) 5 mins      BP rest: 148/82 BP peak: 170/66   Past medical hx Past Medical History  Diagnosis Date  . Hypertension   . History of TIAs AGE 50  AND 2008---  NO RESIDUAL  . GERD (gastroesophageal reflux disease) PT STATES SEVERE---- CONTROLLED W/ NEXIUM  . Allergic rhinitis   . Non-specific gastrointestinal complaint   . Cystocele   . SUI (stress urinary incontinence, female)   . Restless leg syndrome   . Rosacea   .  Actinic keratosis     Precancerous  . CVA (cerebral infarction)   . OSA (obstructive sleep apnea)     Severe CPAP 12 MM H2O- Dr Radford Pax  . Anxiety   . Degenerative disc disease, cervical   . Pulmonary HTN ([facility])     Moderate with PASP 34mmHg by echo 03/2016  . Diastolic dysfunction   . Acid reflux   . Asthma      Past surgical hx, Family hx, Social hx all reviewed.  Current Outpatient Prescriptions on File Prior to Visit  Medication Sig  . albuterol (PROAIR HFA) 108 (90 Base) MCG/ACT inhaler Inhale 2 puffs into the lungs every 4 (four) hours as needed for wheezing or shortness of breath.  . Calcium Carbonate-Vitamin D (CALCIUM PLUS VITAMIN D PO) Take 1 tablet by mouth daily.  . citalopram (CELEXA) 20 MG tablet TAKE 1/2 TABLET DAILY FOR 6 DAYS AND THEN INCREASE TO ONE TABLET ONCE A DAY ORALLY 30 DAY(S)  . clonazePAM (KLONOPIN) 1 MG tablet Take 1 mg by mouth daily.   Marland Kitchen esomeprazole (NEXIUM) 40 MG capsule Take 40 mg by mouth 2 (two) times daily.  . fish oil-omega-3 fatty acids 1000 MG capsule Take 2 g by mouth daily.  . fluticasone (FLONASE) 50 MCG/ACT nasal spray USE 2 SPRAYS IN EACH NOSTRIL DAILY  . hydrochlorothiazide (HYDRODIURIL) 25 MG tablet Take 25 mg by mouth daily.  . montelukast (SINGULAIR) 10 MG tablet Take 10 mg by mouth at bedtime.  Marland Kitchen  oxyCODONE-acetaminophen (PERCOCET/ROXICET) 5-325 MG tablet Take 1 tablet by mouth 2 (two) times daily as needed. (pain)  . polyethylene glycol (MIRALAX / GLYCOLAX) packet Take 17 g by mouth daily.  . potassium chloride (MICRO-K) 10 MEQ CR capsule Take 10 mEq by mouth daily.  . ranitidine (ZANTAC) 300 MG tablet TAKE 1 TABLET DAILY AS DIRECTED FOR REFLUX  . rOPINIRole (REQUIP) 2 MG tablet Take 2 mg by mouth at bedtime.  Marland Kitchen zolpidem (AMBIEN) 5 MG tablet Take 5 mg by mouth at bedtime.    Current Facility-Administered Medications on File Prior to Visit  Medication  . triamcinolone acetonide (KENALOG) 10 MG/ML injection 10 mg     Allergies    Allergen Reactions  . Ceftin [Cefuroxime Axetil]     Lack of Therapeutic Effect  . Hydroxyzine Hcl   . Penicillins Hives  . Septra [Sulfamethoxazole-Trimethoprim] Rash    Review Of Systems:  Constitutional:   No  weight loss, night sweats,  Fevers, chills, fatigue, or  lassitude.  HEENT:   No headaches,  Difficulty swallowing,  Tooth/dental problems, or  Sore throat,                No sneezing, itching, ear ache, nasal congestion, +post nasal drip,   CV:  No chest pain,  Orthopnea, PND, swelling in lower extremities, anasarca, dizziness, palpitations, syncope.   GI  No heartburn, indigestion, abdominal pain, nausea, vomiting, diarrhea, change in bowel habits, loss of appetite, bloody stools.   Resp: + shortness of breath with exertion not  at rest.  No excess mucus, no productive cough,  + non-productive cough,  No coughing up of blood.  No change in color of mucus.  No wheezing.  No chest wall deformity  Skin: no rash or lesions.  GU: no dysuria, change in color of urine, no urgency or frequency.  No flank pain, no hematuria   MS:  No joint pain or swelling.  No decreased range of motion.  No back pain.  Psych:  No change in mood or affect. No depression or anxiety.  No memory loss.   Vital Signs BP 124/80 mmHg  Pulse 57  Ht 5\' 5"  (1.651 m)  Wt 192 lb (87.091 kg)  BMI 31.95 kg/m2  SpO2 96%   Physical Exam:  General- No distress,  A&Ox3, pleasant ENT: No sinus tenderness, TM clear, pale nasal mucosa, no oral exudate,no post nasal drip, no LAN Cardiac: S1, S2, regular rate and rhythm, no murmur Chest: No wheeze/ rales/ dullness; no accessory muscle use, no nasal flaring, no sternal retractions Abd.: Soft Non-tender Ext: No clubbing cyanosis, edema Neuro:  normal strength Skin: No rashes, warm and dry Psych: normal mood and behavior   Assessment/Plan  OSA (obstructive sleep apnea) Compliant with CPAP treatment Plan Continue on CPAP at bedtime. You appear to be  benefiting from the treatment Goal is to wear for at least 4-6 hours each night for maximal clinical benefit. Continue to work on weight loss, as the link between excess weight  and sleep apnea is well established.  Do not drive if sleepy. Follow up with Ramaswamy in 3 months. Please contact office for sooner follow up if symptoms do not improve or worsen or seek emergency care     SOB (shortness of breath) Normal CPST Deconditioning and obesity impacting dyspnea Plan: Your cardiopulmonary stress test was essentially normal We will refer you to Pulmonary Rehab in [facility]. Work on weight loss. Continue the Singulair 10 mg by mouth at  bedtime. Follow up with Dr. Chase Caller in 3 months  Chronic throat clearing   Chronic throat clearing Plan: Add sugar free Jolly Ranchers for throat soothing. Sips of water instead of throat clearing  Avoid chocolate, mint and menthol. Follow up with Dr. Chase Caller in 3 months      Magdalen Spatz, NP 06/10/2016  9:27 PM

## 2016-06-10 NOTE — Assessment & Plan Note (Signed)
Normal CPST Deconditioning and obesity impacting dyspnea Plan: Your cardiopulmonary stress test was essentially normal We will refer you to Pulmonary Rehab in [facility]. Work on weight loss. Continue the Singulair 10 mg by mouth at bedtime. Follow up with Dr. Chase Caller in 3 months

## 2016-06-10 NOTE — Patient Instructions (Addendum)
It is nice to meet you. Your cardiopulmonary stress test was essentially normal We will refer you to Pulmonary Rehab in [facility]. Work on weight loss. Continue the Singulair 10 mg by mouth at bedtime. Add sugar free Jolly Ranchers for throat soothing. Sips of water instead of throat clearing  Avoid chocolate, mint and menthol. Continue on CPAP at bedtime. You appear to be benefiting from the treatment Goal is to wear for at least 4-6 hours each night for maximal clinical benefit. Continue to work on weight loss, as the link between excess weight  and sleep apnea is well established.  Do not drive if sleepy. Follow up with Ramaswamy in 3 months. Please contact office for sooner follow up if symptoms do not improve or worsen or seek emergency care

## 2016-06-23 ENCOUNTER — Encounter: Payer: Medicare PPO | Attending: Internal Medicine | Admitting: Respiratory Therapy

## 2016-06-23 VITALS — Ht 62.25 in | Wt 190.8 lb

## 2016-06-23 DIAGNOSIS — I272 Other secondary pulmonary hypertension: Secondary | ICD-10-CM | POA: Diagnosis not present

## 2016-06-23 DIAGNOSIS — G4730 Sleep apnea, unspecified: Secondary | ICD-10-CM

## 2016-06-23 DIAGNOSIS — I2721 Secondary pulmonary arterial hypertension: Secondary | ICD-10-CM

## 2016-06-23 NOTE — Progress Notes (Signed)
Pulmonary Individual Treatment Plan  Patient Details  Name: Amy Vang MRN: [id number] Date of Birth: 02/17/1946 Referring Provider:   April Manson Pulmonary Rehab from 06/23/2016 in [facility] Cardiac and Pulmonary Rehab  Referring Provider  Ramaswamy      Initial Encounter Date:  Flowsheet Row Pulmonary Rehab from 06/23/2016 in [facility] Cardiac and Pulmonary Rehab  Date  06/23/16  Referring Provider  Ramaswamy      Visit Diagnosis: PAH (pulmonary artery hypertension) ([place])  Sleep apnea  Patient's Home Medications on Admission:  Current Outpatient Prescriptions:    albuterol (PROAIR HFA) 108 (90 Base) MCG/ACT inhaler, Inhale 2 puffs into the lungs every 4 (four) hours as needed for wheezing or shortness of breath., Disp: 1 Inhaler, Rfl: 0   Calcium Carbonate-Vitamin D (CALCIUM PLUS VITAMIN D PO), Take 1 tablet by mouth daily., Disp: , Rfl:    citalopram (CELEXA) 20 MG tablet, TAKE 1/2 TABLET DAILY FOR 6 DAYS AND THEN INCREASE TO ONE TABLET ONCE A DAY ORALLY 30 DAY(S), Disp: , Rfl: 2   clonazePAM (KLONOPIN) 1 MG tablet, Take 1 mg by mouth daily. , Disp: , Rfl:    esomeprazole (NEXIUM) 40 MG capsule, Take 40 mg by mouth 2 (two) times daily., Disp: , Rfl:    fish oil-omega-3 fatty acids 1000 MG capsule, Take 2 g by mouth daily., Disp: , Rfl:    fluticasone (FLONASE) 50 MCG/ACT nasal spray, USE 2 SPRAYS IN EACH NOSTRIL DAILY, Disp: 48 g, Rfl: 1   hydrochlorothiazide (HYDRODIURIL) 25 MG tablet, Take 25 mg by mouth daily., Disp: , Rfl:    montelukast (SINGULAIR) 10 MG tablet, Take 10 mg by mouth at bedtime., Disp: , Rfl:    oxyCODONE-acetaminophen (PERCOCET/ROXICET) 5-325 MG tablet, Take 1 tablet by mouth 2 (two) times daily as needed. (pain), Disp: , Rfl: 0   polyethylene glycol (MIRALAX / GLYCOLAX) packet, Take 17 g by mouth daily., Disp: , Rfl:    potassium chloride (MICRO-K) 10 MEQ CR capsule, Take 10 mEq by mouth daily., Disp: , Rfl:    ranitidine (ZANTAC) 300 MG tablet,  TAKE 1 TABLET DAILY AS DIRECTED FOR REFLUX, Disp: 90 tablet, Rfl: 0   rOPINIRole (REQUIP) 2 MG tablet, Take 2 mg by mouth at bedtime., Disp: , Rfl:    zolpidem (AMBIEN) 5 MG tablet, Take 5 mg by mouth at bedtime. , Disp: , Rfl:   Current Facility-Administered Medications:    triamcinolone acetonide (KENALOG) 10 MG/ML injection 10 mg, 10 mg, Other, Once, Trula Slade, DPM  Past Medical History: Past Medical History:  Diagnosis Date   Acid reflux    Actinic keratosis    Precancerous   Allergic rhinitis    Anxiety    Asthma    CVA (cerebral infarction)    Cystocele    Degenerative disc disease, cervical    Diastolic dysfunction    GERD (gastroesophageal reflux disease) PT STATES SEVERE---- CONTROLLED W/ NEXIUM   History of TIAs AGE 26  AND 2008---  NO RESIDUAL   Hypertension    Non-specific gastrointestinal complaint    OSA (obstructive sleep apnea)    Severe CPAP 12 MM H2O- Dr Radford Pax   Pulmonary HTN ([place])    Moderate with PASP 73mmHg by echo 03/2016   Restless leg syndrome    Rosacea    SUI (stress urinary incontinence, female)     Tobacco Use: History  Smoking Status   Never Smoker  Smokeless Tobacco   Never Used    Labs: Recent Review Flowsheet  Data    Labs for ITP Cardiac and Pulmonary Rehab Latest Ref Rng & Units 09/14/2007 04/11/2012 04/12/2012   Cholestrol 0 - 200 mg/dL 171 ATP III CLASSIFICATION: <200     mg/dL   Desirable 200-239  mg/dL   Borderline High >=240    mg/dL   High - 142   LDLCALC 0 - 99 mg/dL 100 Total Cholesterol/HDL:CHD Risk Coronary Heart Disease Risk Table Men   Women 1/2 Average Risk   3.4   3.3(H) - 75   HDL >39 mg/dL 29(L) - 29(L)   Trlycerides <150 mg/dL 208(H) - 192(H)   Hemoglobin A1c <5.7 % 5.4 (NOTE)   The ADA recommends the following therapeutic goals for glycemic   control related to Hgb A1C measurement:   Goal of Therapy:   < 7.0% Hgb A1C   Action Suggested:  > 8.0% Hgb A1C   Ref:  Diabetes Care, 22,  Suppl. 1, 1999 - 6.0(H)   PHART - 7.372 - -   PCO2ART - 45.7(H) - -   HCO3 - 26.6(H) - -   TCO2 0 - 100 mmol/L 28 27 -   O2SAT - 96.0 - -       ADL UCSD:     Pulmonary Assessment Scores    Row Name 06/23/16 1213         ADL UCSD   ADL Phase Entry     SOB Score total 67     Rest 0     Walk 1     Stairs 5     Bath 0     Dress 0     Shop 5        Pulmonary Function Assessment:     Pulmonary Function Assessment - 06/23/16 1211      Pulmonary Function Tests   FVC% 89 %   FEV1% 94 %   FEV1/FVC Ratio 80   DLCO% 64 %     Initial Spirometry Results   Comments Test date: 04/16/16     Breath   Bilateral Breath Sounds Clear   Shortness of Breath Yes      Exercise Target Goals: Date: 06/23/16  Exercise Program Goal: Individual exercise prescription set with THRR, safety & activity barriers. Participant demonstrates ability to understand and report RPE using BORG scale, to self-measure pulse accurately, and to acknowledge the importance of the exercise prescription.  Exercise Prescription Goal: Starting with aerobic activity 30 plus minutes a day, 3 days per week for initial exercise prescription. Provide home exercise prescription and guidelines that participant acknowledges understanding prior to discharge.  Activity Barriers & Risk Stratification:     Activity Barriers & Cardiac Risk Stratification - 06/23/16 1210      Activity Barriers & Cardiac Risk Stratification   Activity Barriers Deconditioning;Shortness of Breath   Cardiac Risk Stratification Moderate      6 Minute Walk:     6 Minute Walk    Row Name 06/23/16 1536         6 Minute Walk   Distance 1352.5 feet     Walk Time 6 minutes     # of Rest Breaks 0     MPH 2.56     METS 3.19     RPE 12     Perceived Dyspnea  3     VO2 Peak 11.17     Symptoms No        Initial Exercise Prescription:     Initial Exercise Prescription - 06/23/16 1500  Date of Initial Exercise RX and  Referring Provider   Date 06/23/16   Referring Provider Ramaswamy     Treadmill   MPH 2.5   Grade 0.5   Minutes 15   METs 3.09     NuStep   Level 2   Watts 50   Minutes 15   METs 3     REL-XR   Level 1   Watts 50   Minutes 15   METs 3     Prescription Details   Frequency (times per week) 3   Duration Progress to 45 minutes of aerobic exercise without signs/symptoms of physical distress     Intensity   THRR 40-80% of Max Heartrate 102-135   Ratings of Perceived Exertion 11-15   Perceived Dyspnea 0-4     Progression   Progression Continue to progress workloads to maintain intensity without signs/symptoms of physical distress.     Resistance Training   Training Prescription Yes   Weight 2   Reps 10-15      Perform Capillary Blood Glucose checks as needed.  Exercise Prescription Changes:   Exercise Comments:   Discharge Exercise Prescription (Final Exercise Prescription Changes):    Nutrition:  Target Goals: Understanding of nutrition guidelines, daily intake of sodium 1500mg , cholesterol 200mg , calories 30% from fat and 7% or less from saturated fats, daily to have 5 or more servings of fruits and vegetables.  Biometrics:     Pre Biometrics - 06/23/16 1535      Pre Biometrics   Height 5' 2.25" (1.581 m)   Weight 190 lb 12.8 oz (86.5 kg)   Waist Circumference 40.25 inches   Hip Circumference 44.5 inches   Waist to Hip Ratio 0.9 %   BMI (Calculated) 34.7       Nutrition Therapy Plan and Nutrition Goals:     Nutrition Therapy & Goals - 06/23/16 1218      Nutrition Therapy   Diet Ms Ernestina Columbia would like to meet with the dietitian. Her portion sizes are moderate amounts; moderate amount of salt; very few sweets; and she drinks 40+ ounces of water.      Nutrition Discharge: Rate Your Plate Scores:   Psychosocial: Target Goals: Acknowledge presence or absence of depression, maximize coping skills, provide positive support system. Participant  is able to verbalize types and ability to use techniques and skills needed for reducing stress and depression.  Initial Review & Psychosocial Screening:     Initial Psych Review & Screening - 06/23/16 1228      Family Dynamics   Good Support System? Yes   Comments Ms Righi does have good family support from her 2 children and husband. She does have trouble sleeping, although she is very compliant with her CPAP. She  has some anxiousness about her PHA diagnosis and is interested in learning more about this diagnosis.  iagnosis and lac     Barriers   Psychosocial barriers to participate in program The patient should benefit from training in stress management and relaxation.     Screening Interventions   Interventions Encouraged to exercise;Program counselor consult      Quality of Life Scores:     Quality of Life - 06/23/16 1232      Quality of Life Scores   Health/Function Pre 21 %   Socioeconomic Pre 20.88 %   Psych/Spiritual Pre 21 %   Family Pre 21 %   GLOBAL Pre 20.81 %      PHQ-9: Recent Review Flowsheet Data  Depression screen PHQ 2/9 06/23/2016   Decreased Interest 1   Down, Depressed, Hopeless 1   PHQ - 2 Score 2   Altered sleeping 3   Tired, decreased energy 2   Change in appetite 0   Feeling bad or failure about yourself  0   Trouble concentrating 0   Moving slowly or fidgety/restless 0   Suicidal thoughts 0   PHQ-9 Score 7   Difficult doing work/chores Somewhat difficult      Psychosocial Evaluation and Intervention:   Psychosocial Re-Evaluation:  Education: Education Goals: Education classes will be provided on a weekly basis, covering required topics. Participant will state understanding/return demonstration of topics presented.  Learning Barriers/Preferences:     Learning Barriers/Preferences - 06/23/16 1211      Learning Barriers/Preferences   Learning Barriers None   Learning Preferences Group Instruction;Individual  Instruction;Pictoral;Skilled Demonstration;Verbal Instruction;Video;Written Material      Education Topics: Initial Evaluation Education: - Verbal, written and demonstration of respiratory meds, RPE/PD scales, oximetry and breathing techniques. Instruction on use of nebulizers and MDIs: cleaning and proper use, rinsing mouth with steroid doses and importance of monitoring MDI activations. Flowsheet Row Pulmonary Rehab from 06/23/2016 in [facility] Cardiac and Pulmonary Rehab  Date  06/23/16  Educator  LB  Instruction Review Code  2- meets goals/outcomes      General Nutrition Guidelines/Fats and Fiber: -Group instruction provided by verbal, written material, models and posters to present the general guidelines for heart healthy nutrition. Gives an explanation and review of dietary fats and fiber.   Controlling Sodium/Reading Food Labels: -Group verbal and written material supporting the discussion of sodium use in heart healthy nutrition. Review and explanation with models, verbal and written materials for utilization of the food label.   Exercise Physiology & Risk Factors: - Group verbal and written instruction with models to review the exercise physiology of the cardiovascular system and associated critical values. Details cardiovascular disease risk factors and the goals associated with each risk factor.   Aerobic Exercise & Resistance Training: - Gives group verbal and written discussion on the health impact of inactivity. On the components of aerobic and resistive training programs and the benefits of this training and how to safely progress through these programs.   Flexibility, Balance, General Exercise Guidelines: - Provides group verbal and written instruction on the benefits of flexibility and balance training programs. Provides general exercise guidelines with specific guidelines to those with heart or lung disease. Demonstration and skill practice provided.   Stress  Management: - Provides group verbal and written instruction about the health risks of elevated stress, cause of high stress, and healthy ways to reduce stress.   Depression: - Provides group verbal and written instruction on the correlation between heart/lung disease and depressed mood, treatment options, and the stigmas associated with seeking treatment.   Exercise & Equipment Safety: - Individual verbal instruction and demonstration of equipment use and safety with use of the equipment.   Infection Prevention: - Provides verbal and written material to individual with discussion of infection control including proper hand washing and proper equipment cleaning during exercise session.   Falls Prevention: - Provides verbal and written material to individual with discussion of falls prevention and safety. Flowsheet Row Pulmonary Rehab from 06/23/2016 in [facility] Cardiac and Pulmonary Rehab  Date  06/23/16  Educator  LB  Instruction Review Code  2- meets goals/outcomes      Diabetes: - Individual verbal and written instruction to review signs/symptoms of diabetes, desired ranges of  glucose level fasting, after meals and with exercise. Advice that pre and post exercise glucose checks will be done for 3 sessions at entry of program.   Chronic Lung Diseases: - Group verbal and written instruction to review new updates, new respiratory medications, new advancements in procedures and treatments. Provide informative websites and "800" numbers of self-education.   Lung Procedures: - Group verbal and written instruction to describe testing methods done to diagnose lung disease. Review the outcome of test results. Describe the treatment choices: Pulmonary Function Tests, ABGs and oximetry.   Energy Conservation: - Provide group verbal and written instruction for methods to conserve energy, plan and organize activities. Instruct on pacing techniques, use of adaptive equipment and  posture/positioning to relieve shortness of breath.   Triggers: - Group verbal and written instruction to review types of environmental controls: home humidity, furnaces, filters, dust mite/pet prevention, HEPA vacuums. To discuss weather changes, air quality and the benefits of nasal washing.   Exacerbations: - Group verbal and written instruction to provide: warning signs, infection symptoms, calling MD promptly, preventive modes, and value of vaccinations. Review: effective airway clearance, coughing and/or vibration techniques. Create an Sports administrator.   Oxygen: - Individual and group verbal and written instruction on oxygen therapy. Includes supplement oxygen, available portable oxygen systems, continuous and intermittent flow rates, oxygen safety, concentrators, and Medicare reimbursement for oxygen.   Respiratory Medications: - Group verbal and written instruction to review medications for lung disease. Drug class, frequency, complications, importance of spacers, rinsing mouth after steroid MDI's, and proper cleaning methods for nebulizers. Flowsheet Row Pulmonary Rehab from 06/23/2016 in [facility] Cardiac and Pulmonary Rehab  Date  06/23/16  Educator  LB  Instruction Review Code  2- meets goals/outcomes      AED/CPR: - Group verbal and written instruction with the use of models to demonstrate the basic use of the AED with the basic ABC's of resuscitation.   Breathing Retraining: - Provides individuals verbal and written instruction on purpose, frequency, and proper technique of diaphragmatic breathing and pursed-lipped breathing. Applies individual practice skills. Flowsheet Row Pulmonary Rehab from 06/23/2016 in [facility] Cardiac and Pulmonary Rehab  Date  06/23/16  Educator  LB  Instruction Review Code  2- meets goals/outcomes      Anatomy and Physiology of the Lungs: - Group verbal and written instruction with the use of models to provide basic lung anatomy and physiology related  to function, structure and complications of lung disease.   Heart Failure: - Group verbal and written instruction on the basics of heart failure: signs/symptoms, treatments, explanation of ejection fraction, enlarged heart and cardiomyopathy.   Sleep Apnea: - Individual verbal and written instruction to review Obstructive Sleep Apnea. Review of risk factors, methods for diagnosing and types of masks and machines for OSA. Flowsheet Row Pulmonary Rehab from 06/23/2016 in [facility] Cardiac and Pulmonary Rehab  Date  06/23/16  Educator  LB  Instruction Review Code  2- meets goals/outcomes      Anxiety: - Provides group, verbal and written instruction on the correlation between heart/lung disease and anxiety, treatment options, and management of anxiety.   Relaxation: - Provides group, verbal and written instruction about the benefits of relaxation for patients with heart/lung disease. Also provides patients with examples of relaxation techniques.   Knowledge Questionnaire Score:     Knowledge Questionnaire Score - 06/23/16 1211      Knowledge Questionnaire Score   Pre Score 8/10       Core Components/Risk Factors/Patient Goals at  Admission:     Personal Goals and Risk Factors at Admission - 06/23/16 1223      Core Components/Risk Factors/Patient Goals on Admission    Weight Management Yes;Weight Loss  Ms Ernestina Columbia would like to meet with the dietitian. Her portion sizes are moderate amounts; moderate amount of salt; very few sweets; and she drinks 40+ ounces of water   Intervention Weight Management: Develop a combined nutrition and exercise program designed to reach desired caloric intake, while maintaining appropriate intake of nutrient and fiber, sodium and fats, and appropriate energy expenditure required for the weight goal.;Weight Management: Provide education and appropriate resources to help participant work on and attain dietary goals.;Weight Management/Obesity: Establish  reasonable short term and long term weight goals.;Obesity: Provide education and appropriate resources to help participant work on and attain dietary goals.   Admit Weight 190 lb 12.8 oz (86.5 kg)   Goal Weight: Short Term 170 lb (77.1 kg)   Goal Weight: Long Term 160 lb (72.6 kg)   Expected Outcomes Short Term: Continue to assess and modify interventions until short term weight is achieved;Long Term: Adherence to nutrition and physical activity/exercise program aimed toward attainment of established weight goal;Weight Loss: Understanding of general recommendations for a balanced deficit meal plan, which promotes 1-2 lb weight loss per week and includes a negative energy balance of [id number] kcal/d;Understanding recommendations for meals to include 15-35% energy as protein, 25-35% energy from fat, 35-60% energy from carbohydrates, less than 200mg  of dietary cholesterol, 20-35 gm of total fiber daily;Understanding of distribution of calorie intake throughout the day with the consumption of 4-5 meals/snacks   Sedentary Yes  Member of [employer]   Intervention Provide advice, education, support and counseling about physical activity/exercise needs.;Develop an individualized exercise prescription for aerobic and resistive training based on initial evaluation findings, risk stratification, comorbidities and participant's personal goals.   Expected Outcomes Achievement of increased cardiorespiratory fitness and enhanced flexibility, muscular endurance and strength shown through measurements of functional capacity and personal statement of participant.   Increase Strength and Stamina Yes   Intervention Provide advice, education, support and counseling about physical activity/exercise needs.;Develop an individualized exercise prescription for aerobic and resistive training based on initial evaluation findings, risk stratification, comorbidities and participant's personal goals.   Expected Outcomes Achievement  of increased cardiorespiratory fitness and enhanced flexibility, muscular endurance and strength shown through measurements of functional capacity and personal statement of participant.   Improve shortness of breath with ADL's Yes   Intervention Provide education, individualized exercise plan and daily activity instruction to help decrease symptoms of SOB with activities of daily living.   Expected Outcomes Short Term: Achieves a reduction of symptoms when performing activities of daily living.   Develop more efficient breathing techniques such as purse lipped breathing and diaphragmatic breathing; and practicing self-pacing with activity Yes   Intervention Provide education, demonstration and support about specific breathing techniuqes utilized for more efficient breathing. Include techniques such as pursed lipped breathing, diaphragmatic breathing and self-pacing activity.   Expected Outcomes Short Term: Participant will be able to demonstrate and use breathing techniques as needed throughout daily activities.   Increase knowledge of respiratory medications and ability to use respiratory devices properly  Yes  Uses Albuterol MDI rarely, although carries in her purse. CPAP at 12cmH2O pressure, nasal mask, very compliant.   Intervention Provide education and demonstration as needed of appropriate use of medications, inhalers, and oxygen therapy.   Expected Outcomes Short Term: Achieves understanding of medications use. Understands that oxygen  is a medication prescribed by physician. Demonstrates appropriate use of inhaler and oxygen therapy.   Hypertension Yes   Intervention Provide education on lifestyle modifcations including regular physical activity/exercise, weight management, moderate sodium restriction and increased consumption of fresh fruit, vegetables, and low fat dairy, alcohol moderation, and smoking cessation.;Monitor prescription use compliance.   Expected Outcomes Short Term: Continued  assessment and intervention until BP is < 140/41mm HG in hypertensive participants. < 130/61mm HG in hypertensive participants with diabetes, heart failure or chronic kidney disease.;Long Term: Maintenance of blood pressure at goal levels.   Lipids Yes   Intervention Provide education and support for participant on nutrition & aerobic/resistive exercise along with prescribed medications to achieve LDL 70mg , HDL >40mg .   Expected Outcomes Short Term: Participant states understanding of desired cholesterol values and is compliant with medications prescribed. Participant is following exercise prescription and nutrition guidelines.;Long Term: Cholesterol controlled with medications as prescribed, with individualized exercise RX and with personalized nutrition plan. Value goals: LDL < 70mg , HDL > 40 mg.      Core Components/Risk Factors/Patient Goals Review:    Core Components/Risk Factors/Patient Goals at Discharge (Final Review):    ITP Comments:   Comments: Ms Dewindt plans to start [facility] on 06/29/2016 and attend 3 days/week.

## 2016-06-23 NOTE — Patient Instructions (Signed)
Patient Instructions  Patient Details  Name: Amy Vang MRN: [id number] Date of Birth: 04-27-1946 Referring Provider:  Brand Males, MD  Below are the personal goals you chose as well as exercise and nutrition goals. Our goal is to help you keep on track towards obtaining and maintaining your goals. We will be discussing your progress on these goals with you throughout the program.  Initial Exercise Prescription:     Initial Exercise Prescription - 06/23/16 1500      Date of Initial Exercise RX and Referring Provider   Date 06/23/16   Referring Provider Ramaswamy     Treadmill   MPH 2.5   Grade 0.5   Minutes 15   METs 3.09     NuStep   Level 2   Watts 50   Minutes 15   METs 3     REL-XR   Level 1   Watts 50   Minutes 15   METs 3     Prescription Details   Frequency (times per week) 3   Duration Progress to 45 minutes of aerobic exercise without signs/symptoms of physical distress     Intensity   THRR 40-80% of Max Heartrate 102-135   Ratings of Perceived Exertion 11-15   Perceived Dyspnea 0-4     Progression   Progression Continue to progress workloads to maintain intensity without signs/symptoms of physical distress.     Resistance Training   Training Prescription Yes   Weight 2   Reps 10-15      Exercise Goals: Frequency: Be able to perform aerobic exercise three times per week working toward 3-5 days per week.  Intensity: Work with a perceived exertion of 11 (fairly light) - 15 (hard) as tolerated. Follow your new exercise prescription and watch for changes in prescription as you progress with the program. Changes will be reviewed with you when they are made.  Duration: You should be able to do 30 minutes of continuous aerobic exercise in addition to a 5 minute warm-up and a 5 minute cool-down routine.  Nutrition Goals: Your personal nutrition goals will be established when you do your nutrition analysis with the dietician.  The  following are nutrition guidelines to follow: Cholesterol < 200mg /day Sodium < 1500mg /day Fiber: Women over 50 yrs - 21 grams per day  Personal Goals:     Personal Goals and Risk Factors at Admission - 06/23/16 1223      Core Components/Risk Factors/Patient Goals on Admission    Weight Management Yes;Weight Loss  [name] would like to meet with the dietitian. Her portion sizes are moderate amounts; moderate amount of salt; very few sweets; and she drinks 40+ ounces of water   Intervention Weight Management: Develop a combined nutrition and exercise program designed to reach desired caloric intake, while maintaining appropriate intake of nutrient and fiber, sodium and fats, and appropriate energy expenditure required for the weight goal.;Weight Management: Provide education and appropriate resources to help participant work on and attain dietary goals.;Weight Management/Obesity: Establish reasonable short term and long term weight goals.;Obesity: Provide education and appropriate resources to help participant work on and attain dietary goals.   Admit Weight 190 lb 12.8 oz (86.5 kg)   Goal Weight: Short Term 170 lb (77.1 kg)   Goal Weight: Long Term 160 lb (72.6 kg)   Expected Outcomes Short Term: Continue to assess and modify interventions until short term weight is achieved;Long Term: Adherence to nutrition and physical activity/exercise program aimed toward attainment of established weight goal;Weight  Loss: Understanding of general recommendations for a balanced deficit meal plan, which promotes 1-2 lb weight loss per week and includes a negative energy balance of [id number] kcal/d;Understanding recommendations for meals to include 15-35% energy as protein, 25-35% energy from fat, 35-60% energy from carbohydrates, less than 200mg  of dietary cholesterol, 20-35 gm of total fiber daily;Understanding of distribution of calorie intake throughout the day with the consumption of 4-5 meals/snacks    Sedentary Yes  Member of [employer]   Intervention Provide advice, education, support and counseling about physical activity/exercise needs.;Develop an individualized exercise prescription for aerobic and resistive training based on initial evaluation findings, risk stratification, comorbidities and participant's personal goals.   Expected Outcomes Achievement of increased cardiorespiratory fitness and enhanced flexibility, muscular endurance and strength shown through measurements of functional capacity and personal statement of participant.   Increase Strength and Stamina Yes   Intervention Provide advice, education, support and counseling about physical activity/exercise needs.;Develop an individualized exercise prescription for aerobic and resistive training based on initial evaluation findings, risk stratification, comorbidities and participant's personal goals.   Expected Outcomes Achievement of increased cardiorespiratory fitness and enhanced flexibility, muscular endurance and strength shown through measurements of functional capacity and personal statement of participant.   Improve shortness of breath with ADL's Yes   Intervention Provide education, individualized exercise plan and daily activity instruction to help decrease symptoms of SOB with activities of daily living.   Expected Outcomes Short Term: Achieves a reduction of symptoms when performing activities of daily living.   Develop more efficient breathing techniques such as purse lipped breathing and diaphragmatic breathing; and practicing self-pacing with activity Yes   Intervention Provide education, demonstration and support about specific breathing techniuqes utilized for more efficient breathing. Include techniques such as pursed lipped breathing, diaphragmatic breathing and self-pacing activity.   Expected Outcomes Short Term: Participant will be able to demonstrate and use breathing techniques as needed throughout daily  activities.   Increase knowledge of respiratory medications and ability to use respiratory devices properly  Yes  Uses Albuterol MDI rarely, although carries in her purse. CPAP at 12cmH2O pressure, nasal mask, very compliant.   Intervention Provide education and demonstration as needed of appropriate use of medications, inhalers, and oxygen therapy.   Expected Outcomes Short Term: Achieves understanding of medications use. Understands that oxygen is a medication prescribed by physician. Demonstrates appropriate use of inhaler and oxygen therapy.   Hypertension Yes   Intervention Provide education on lifestyle modifcations including regular physical activity/exercise, weight management, moderate sodium restriction and increased consumption of fresh fruit, vegetables, and low fat dairy, alcohol moderation, and smoking cessation.;Monitor prescription use compliance.   Expected Outcomes Short Term: Continued assessment and intervention until BP is < 140/45mm HG in hypertensive participants. < 130/74mm HG in hypertensive participants with diabetes, heart failure or chronic kidney disease.;Long Term: Maintenance of blood pressure at goal levels.   Lipids Yes   Intervention Provide education and support for participant on nutrition & aerobic/resistive exercise along with prescribed medications to achieve LDL 70mg , HDL >40mg .   Expected Outcomes Short Term: Participant states understanding of desired cholesterol values and is compliant with medications prescribed. Participant is following exercise prescription and nutrition guidelines.;Long Term: Cholesterol controlled with medications as prescribed, with individualized exercise RX and with personalized nutrition plan. Value goals: LDL < 70mg , HDL > 40 mg.      Tobacco Use Initial Evaluation: History  Smoking Status   Never Smoker  Smokeless Tobacco   Never Used    Copy  of goals given to participant.

## 2016-06-25 ENCOUNTER — Encounter: Payer: Self-pay | Admitting: Cardiology

## 2016-06-26 ENCOUNTER — Encounter (HOSPITAL_COMMUNITY): Payer: Self-pay | Admitting: Emergency Medicine

## 2016-06-26 ENCOUNTER — Emergency Department (HOSPITAL_COMMUNITY): Payer: Medicare PPO

## 2016-06-26 ENCOUNTER — Emergency Department (HOSPITAL_COMMUNITY)
Admission: EM | Admit: 2016-06-26 | Discharge: 2016-06-26 | Disposition: A | Discharge status: Home / Self Care | Payer: Medicare PPO | Attending: Emergency Medicine | Admitting: Emergency Medicine

## 2016-06-26 ENCOUNTER — Other Ambulatory Visit: Payer: Self-pay

## 2016-06-26 DIAGNOSIS — Z7982 Long term (current) use of aspirin: Secondary | ICD-10-CM | POA: Diagnosis not present

## 2016-06-26 DIAGNOSIS — I6340 Cerebral infarction due to embolism of unspecified cerebral artery: Secondary | ICD-10-CM

## 2016-06-26 DIAGNOSIS — J45909 Unspecified asthma, uncomplicated: Secondary | ICD-10-CM | POA: Diagnosis not present

## 2016-06-26 DIAGNOSIS — G459 Transient cerebral ischemic attack, unspecified: Secondary | ICD-10-CM | POA: Diagnosis not present

## 2016-06-26 DIAGNOSIS — I10 Essential (primary) hypertension: Secondary | ICD-10-CM | POA: Insufficient documentation

## 2016-06-26 DIAGNOSIS — Z79899 Other long term (current) drug therapy: Secondary | ICD-10-CM | POA: Insufficient documentation

## 2016-06-26 DIAGNOSIS — R4781 Slurred speech: Secondary | ICD-10-CM | POA: Diagnosis present

## 2016-06-26 LAB — I-STAT CHEM 8, ED
BUN: 8 mg/dL (ref 6–20)
CREATININE: 0.8 mg/dL (ref 0.44–1.00)
Calcium, Ion: 1.02 mmol/L — ABNORMAL LOW (ref 1.12–1.23)
Chloride: 100 mmol/L — ABNORMAL LOW (ref 101–111)
Glucose, Bld: 109 mg/dL — ABNORMAL HIGH (ref 65–99)
HEMATOCRIT: 37 % (ref 36.0–46.0)
HEMOGLOBIN: 12.6 g/dL (ref 12.0–15.0)
Potassium: 3.1 mmol/L — ABNORMAL LOW (ref 3.5–5.1)
SODIUM: 138 mmol/L (ref 135–145)
TCO2: 25 mmol/L (ref 0–100)

## 2016-06-26 LAB — DIFFERENTIAL
BASOS ABS: 0 10*3/uL (ref 0.0–0.1)
BASOS PCT: 0 %
EOS ABS: 0.1 10*3/uL (ref 0.0–0.7)
EOS PCT: 2 %
LYMPHS PCT: 48 %
Lymphs Abs: 3 10*3/uL (ref 0.7–4.0)
MONO ABS: 0.5 10*3/uL (ref 0.1–1.0)
MONOS PCT: 8 %
NEUTROS ABS: 2.6 10*3/uL (ref 1.7–7.7)
Neutrophils Relative %: 42 %

## 2016-06-26 LAB — APTT: APTT: 30 s (ref 24–36)

## 2016-06-26 LAB — PROTIME-INR
INR: 2.29
INR: 1.01
PROTHROMBIN TIME: 25.6 s — AB (ref 11.4–15.2)
PROTHROMBIN TIME: 13.3 s (ref 11.4–15.2)

## 2016-06-26 LAB — CBC
HCT: 38 % (ref 36.0–46.0)
Hemoglobin: 12.6 g/dL (ref 12.0–15.0)
MCH: 29.5 pg (ref 26.0–34.0)
MCHC: 33.2 g/dL (ref 30.0–36.0)
MCV: 89 fL (ref 78.0–100.0)
PLATELETS: 219 10*3/uL (ref 150–400)
RBC: 4.27 MIL/uL (ref 3.87–5.11)
RDW: 13 % (ref 11.5–15.5)
WBC: 6.1 10*3/uL (ref 4.0–10.5)

## 2016-06-26 LAB — COMPREHENSIVE METABOLIC PANEL
ALT: 17 U/L (ref 14–54)
ANION GAP: 9 (ref 5–15)
AST: 25 U/L (ref 15–41)
Albumin: 4 g/dL (ref 3.5–5.0)
Alkaline Phosphatase: 48 U/L (ref 38–126)
BUN: 8 mg/dL (ref 6–20)
CHLORIDE: 102 mmol/L (ref 101–111)
CO2: 26 mmol/L (ref 22–32)
CREATININE: 0.66 mg/dL (ref 0.44–1.00)
Calcium: 8.9 mg/dL (ref 8.9–10.3)
Glucose, Bld: 110 mg/dL — ABNORMAL HIGH (ref 65–99)
POTASSIUM: 3.2 mmol/L — AB (ref 3.5–5.1)
SODIUM: 137 mmol/L (ref 135–145)
Total Bilirubin: 0.7 mg/dL (ref 0.3–1.2)
Total Protein: 6.3 g/dL — ABNORMAL LOW (ref 6.5–8.1)

## 2016-06-26 LAB — CBG MONITORING, ED: GLUCOSE-CAPILLARY: 113 mg/dL — AB (ref 65–99)

## 2016-06-26 LAB — I-STAT TROPONIN, ED: TROPONIN I, POC: 0 ng/mL (ref 0.00–0.08)

## 2016-06-26 NOTE — ED Notes (Signed)
PTT greater than 200 per lab.

## 2016-06-26 NOTE — ED Triage Notes (Signed)
Pt was fine this am doing meals on wheels and   Went home had a sandwhich and then at 1 pm she states she doesn not feel well and her speech is slurred and cannot hold up her arms face is drawn per husband and had unsteady gait and left sided weakness

## 2016-06-26 NOTE — Consult Note (Signed)
Requesting Physician: ED MD    Chief Complaint: Code Stroke  History obtained from:  Patient     HPI:                                                                                                                                         Amy Vang is an 70 y.o. female arriving to [facility] via private vehicle at 65.  Patient was at home eating lunch with her husband when she had sudden onset unsteady gait at 1230.  Husband reports right facial droop and slurred speech.  Code stroke called on patient arrival.  Patient to CT.  Stroke team to the bedside.  NIHSS 6. Patient currently states she has right arm and leg weakness but is giving poor to no effort. She is complaining of neck discomfort but will not lift her left arm (good arm) to point to where it is uncomfortable. CT head was negative for acute CVA or bleed.     Date last known well: Today Time last known well: Time: 12:30 tPA Given: No: minimal symptoms   Past Medical History:  Diagnosis Date  . Acid reflux   . Actinic keratosis    Precancerous  . Allergic rhinitis   . Anxiety   . Asthma   . CVA (cerebral infarction)   . Cystocele   . Degenerative disc disease, cervical   . Diastolic dysfunction   . GERD (gastroesophageal reflux disease) PT STATES SEVERE---- CONTROLLED W/ NEXIUM  . History of TIAs AGE 31  AND 2008---  NO RESIDUAL  . Hypertension   . Non-specific gastrointestinal complaint   . OSA (obstructive sleep apnea)    Severe CPAP 12 MM H2O- Dr Radford Pax  . Pulmonary HTN ([facility])    Moderate with PASP 85mmHg by echo 03/2016  . Restless leg syndrome   . Rosacea   . SUI (stress urinary incontinence, female)     Past Surgical History:  Procedure Laterality Date  . BILATERAL BENIGN BREAST BX'S  20 YRS AGO  . CARPAL TUNNEL RELEASE  2009   BILATERAL  . CYSTOCELE REPAIR  12/11/2011   Procedure: ANTERIOR REPAIR (CYSTOCELE);  Surgeon: Claybon Jabs, MD;  Location: [facility];  Service: Urology;   Laterality: N/A;  1 1/2 hour requested for case  Anterior repair and mid Urethral Sling  . NONINVASIVE VASCULAR CAROTID STUDY  09-14-2007   BILATERAL MILD MIX PLAQUE THROUGHOUT, NO SIGNIFICANT BILATERAL ICA STENOSIS  . PUBOVAGINAL SLING  12/11/2011   Procedure: Gaynelle Arabian;  Surgeon: Claybon Jabs, MD;  Location: [facility];  Service: Urology;  Laterality: N/A;  . PULLEY RELEASE RIGHT THUMB  2009  . VAGINAL HYSTERECTOMY  AGE 28    Family History  Problem Relation Age of Onset  . Adopted: Yes  . Diabetes Mother   . Stroke Mother   . Alzheimer's disease Mother   .  Hypertension Sister   . Diabetes Sister   . Heart attack Brother   . Leukemia Brother   . CAD Brother   . Diabetes Brother   . Colon cancer Maternal Aunt   . Stroke Paternal Grandmother   . Heart attack Paternal Grandfather    Social History:  reports that she has never smoked. She has never used smokeless tobacco. She reports that she does not drink alcohol or use drugs.  Allergies:  Allergies  Allergen Reactions  . Ceftin [Cefuroxime Axetil]     Lack of Therapeutic Effect  . Hydroxyzine Hcl   . Penicillins Hives  . Septra [Sulfamethoxazole-Trimethoprim] Rash    Medications:                                                                                                                           Current Facility-Administered Medications  Medication Dose Route Frequency Provider Last Rate Last Dose  . triamcinolone acetonide (KENALOG) 10 MG/ML injection 10 mg  10 mg Other Once Trula Slade, DPM       Current Outpatient Prescriptions  Medication Sig Dispense Refill  . albuterol (PROAIR HFA) 108 (90 Base) MCG/ACT inhaler Inhale 2 puffs into the lungs every 4 (four) hours as needed for wheezing or shortness of breath. 1 Inhaler 0  . Calcium Carbonate-Vitamin D (CALCIUM PLUS VITAMIN D PO) Take 1 tablet by mouth daily.    . citalopram (CELEXA) 20 MG tablet TAKE 1/2 TABLET DAILY FOR 6 DAYS  AND THEN INCREASE TO ONE TABLET ONCE A DAY ORALLY 30 DAY(S)  2  . clonazePAM (KLONOPIN) 1 MG tablet Take 1 mg by mouth daily.     Marland Kitchen esomeprazole (NEXIUM) 40 MG capsule Take 40 mg by mouth 2 (two) times daily.    . fish oil-omega-3 fatty acids 1000 MG capsule Take 2 g by mouth daily.    . fluticasone (FLONASE) 50 MCG/ACT nasal spray USE 2 SPRAYS IN EACH NOSTRIL DAILY 48 g 1  . hydrochlorothiazide (HYDRODIURIL) 25 MG tablet Take 25 mg by mouth daily.    . montelukast (SINGULAIR) 10 MG tablet Take 10 mg by mouth at bedtime.    Marland Kitchen oxyCODONE-acetaminophen (PERCOCET/ROXICET) 5-325 MG tablet Take 1 tablet by mouth 2 (two) times daily as needed. (pain)  0  . polyethylene glycol (MIRALAX / GLYCOLAX) packet Take 17 g by mouth daily.    . potassium chloride (MICRO-K) 10 MEQ CR capsule Take 10 mEq by mouth daily.    . ranitidine (ZANTAC) 300 MG tablet TAKE 1 TABLET DAILY AS DIRECTED FOR REFLUX 90 tablet 0  . rOPINIRole (REQUIP) 2 MG tablet Take 2 mg by mouth at bedtime.    Marland Kitchen zolpidem (AMBIEN) 5 MG tablet Take 5 mg by mouth at bedtime.        ROS:  History obtained from the patient  General ROS: negative for - chills, fatigue, fever, night sweats, weight gain or weight loss Psychological ROS: negative for - behavioral disorder, hallucinations, memory difficulties, mood swings or suicidal ideation Ophthalmic ROS: negative for - blurry vision, double vision, eye pain or loss of vision ENT ROS: negative for - epistaxis, nasal discharge, oral lesions, sore throat, tinnitus or vertigo Allergy and Immunology ROS: negative for - hives or itchy/watery eyes Hematological and Lymphatic ROS: negative for - bleeding problems, bruising or swollen lymph nodes Endocrine ROS: negative for - galactorrhea, hair pattern changes, polydipsia/polyuria or temperature intolerance Respiratory ROS:  negative for - cough, hemoptysis, shortness of breath or wheezing Cardiovascular ROS: negative for - chest pain, dyspnea on exertion, edema or irregular heartbeat Gastrointestinal ROS: negative for - abdominal pain, diarrhea, hematemesis, nausea/vomiting or stool incontinence Genito-Urinary ROS: negative for - dysuria, hematuria, incontinence or urinary frequency/urgency Musculoskeletal ROS: negative for - joint swelling or muscular weakness Neurological ROS: as noted in HPI Dermatological ROS: negative for rash and skin lesion changes  Neurologic Examination:                                                                                                      Blood pressure 146/84, pulse 67, temperature 98.3 F (36.8 C), weight 89.1 kg (196 lb 6.9 oz), SpO2 94 %.  HEENT-  Normocephalic, no lesions, without obvious abnormality.  Normal external eye and conjunctiva.  Normal TM's bilaterally.  Normal auditory canals and external ears. Normal external nose, mucus membranes and septum.  Normal pharynx. Cardiovascular- S1, S2 normal, pulses palpable throughout   Lungs- chest clear, no wheezing, rales, normal symmetric air entry Abdomen- normal findings: bowel sounds normal Extremities- no joint deformities, effusion, or inflammation Lymph-no adenopathy palpable Musculoskeletal-no joint tenderness, deformity or swelling Skin-warm and dry, no hyperpigmentation, vitiligo, or suspicious lesions  Neurological Examination Mental Status: Alert, oriented, thought content appropriate.  Speech fluent without evidence of aphasia.  Able to follow 3 step commands without difficulty. Cranial Nerves: II: Visual fields grossly normal, pupils equal, round, reactive to light and accommodation III,IV, VI: ptosis not present, extra-ocular motions intact bilaterally V,VII: smile symmetric, facial light touch sensation normal bilaterally VIII: hearing normal bilaterally IX,X: uvula rises symmetrically XI:  bilateral shoulder shrug XII: midline tongue extension Motor: Right : Upper extremity   1/5    Left:     Upper extremity   5/5  Lower extremity   1/5     Lower extremity   5/5 --poor to no effort on the right, positive hoover's, avoids face with hand drop maneuver, able to lift leg with full strength when plantar stimulus given.  Tone and bulk:normal tone throughout; no atrophy noted Sensory: Pinprick and light touch intact throughout, bilaterally Deep Tendon Reflexes: 1+ and symmetric throughout Plantars: Right: downgoing   Left: downgoing Cerebellar: Would not take part in exam on the right.normal left left F-N.  Gait: not tested       Lab Results: Basic Metabolic Panel:  Recent Labs Lab 06/26/16 1452  NA 138  K 3.1*  CL 100*  GLUCOSE 109*  BUN 8  CREATININE 0.80    Liver Function Tests: No results for input(s): AST, ALT, ALKPHOS, BILITOT, PROT, ALBUMIN in the last 168 hours. No results for input(s): LIPASE, AMYLASE in the last 168 hours. No results for input(s): AMMONIA in the last 168 hours.  CBC:  Recent Labs Lab 06/26/16 1449 06/26/16 1452  WBC 6.1  --   NEUTROABS 2.6  --   HGB 12.6 12.6  HCT 38.0 37.0  MCV 89.0  --   PLT 219  --     Cardiac Enzymes: No results for input(s): CKTOTAL, CKMB, CKMBINDEX, TROPONINI in the last 168 hours.  Lipid Panel: No results for input(s): CHOL, TRIG, HDL, CHOLHDL, VLDL, LDLCALC in the last 168 hours.  CBG:  Recent Labs Lab 06/26/16 [place]    Microbiology: No results found for this or any previous visit.  Coagulation Studies: No results for input(s): LABPROT, INR in the last 72 hours.  Imaging: Ct Head Code Stroke W/o Cm  Result Date: 06/26/2016 CLINICAL DATA:  Right-sided weakness and slurred speech for less than 2 hours EXAM: CT HEAD WITHOUT CONTRAST TECHNIQUE: Contiguous axial images were obtained from the base of the skull through the vertex without intravenous contrast. COMPARISON:   04/11/2012 FINDINGS: The bony calvarium is intact. Increased opacification of the left maxillary antrum is noted consistent with chronic sinusitis. Rounded area of decreased attenuation is noted within the basal ganglia bilaterally stable from the prior exam. No findings to suggest acute hemorrhage, acute infarction or space-occupying mass lesion are noted. IMPRESSION: Chronic changes without acute abnormality. No change from the prior study. Critical Value/emergent results were called by telephone at the time of interpretation on 06/26/2016 at 2:43 pm to Dr. Tasia Catchings, who verbally acknowledged these results. Electronically Signed   By: Inez Catalina M.D.   On: 06/26/2016 14:46      Assessment and plan discussed with with attending physician and they are in agreement.    Etta Quill PA-C [facility] [phone]  06/26/2016, 3:09 PM   Assessment: 70 y.o. female with multitude of symptoms including HA, neck pain, right sided weakness associated with multiple inconsistencies and poor effort.   Stroke Risk Factors - hyperlipidemia and hypertension    Neurology attending:  Jaquanda is a 70 year old patient who presented with symptoms of unsteady gait and slurred speech and right facial droop. However, there was a degree of functionality to the presentation. The symptoms were inconsistent during her evaluation. At times her evaluation actually appeared normal.  Given that her symptoms were inconsistent and relatively mild, it was not entirely clear that she was experiencing an ischemic event. It was not felt that TPA was in her best interest.  She is to be admitted for additional evaluations including MRI.  Elson Clan M.D. Neuro hospitalist [phone]

## 2016-06-26 NOTE — ED Notes (Signed)
EDP evaluating patient.

## 2016-06-26 NOTE — ED Notes (Signed)
Pt transported to MRI 

## 2016-06-26 NOTE — ED Notes (Signed)
Dr Ouida Sills in to see pt , Janett Billow into do eval.family in room

## 2016-06-26 NOTE — ED Notes (Signed)
[facility] contacted to call Code Stroke 

## 2016-06-26 NOTE — ED Notes (Signed)
Pt back from MRI 

## 2016-06-26 NOTE — ED Notes (Signed)
Pt CBG 113.  Informed Amy, RN.

## 2016-06-26 NOTE — Code Documentation (Signed)
70yo female arriving to [facility] via private vehicle at 31.  Patient was at home eating lunch with her husband when she had sudden onset unsteady gait at 1230.  Husband reports right facial droop and slurred speech.  Code stroke called on patient arrival.  Patient to CT.  Stroke team to the bedside.  NIHSS 6, see documentation for details and code stroke times.  Patient with right arm and bilateral leg weakness with functional components on exam.  Dr. Tasia Catchings to the bedside.  No acute stroke treatment at this time.  Code stroke canceled.  Bedside handoff with ED RN Jenny Reichmann.

## 2016-06-26 NOTE — ED Notes (Signed)
Unable to complete 1800 neuro checks due to pt being in MRI

## 2016-06-26 NOTE — ED Provider Notes (Addendum)
[facility] DEPT Provider Note   CSN: [id number] Arrival date & time: 06/26/16  1417  First Provider Contact:  None       History   Chief Complaint Chief Complaint  Patient presents with  . Code Stroke    HPI Amy Vang is a 70 y.o. female.  HPI Pt was fine this am doing meals on wheels and Went home had a sandwhich and then at 1 pm she states she doesn not feel well and her speech is slurred and cannot hold up her arms face is drawn per husband and had unsteady gait and left sided weakness  Past Medical History:  Diagnosis Date  . Acid reflux   . Actinic keratosis    Precancerous  . Allergic rhinitis   . Anxiety   . Asthma   . CVA (cerebral infarction)   . Cystocele   . Degenerative disc disease, cervical   . Diastolic dysfunction   . GERD (gastroesophageal reflux disease) PT STATES SEVERE---- CONTROLLED W/ NEXIUM  . History of TIAs AGE 37  AND 2008---  NO RESIDUAL  . Hypertension   . Non-specific gastrointestinal complaint   . OSA (obstructive sleep apnea)    Severe CPAP 12 MM H2O- Dr Radford Pax  . Pulmonary HTN ([place])    Moderate with PASP 13mHg by echo 03/2016  . Restless leg syndrome   . Rosacea   . SUI (stress urinary incontinence, female)     Patient Active Problem List   Diagnosis Date Noted  . Dyspnea and respiratory abnormality 05/21/2016  . Wheeze 05/21/2016  . Chronic throat clearing 05/21/2016  . [place] 04/23/2016  . Moderate persistent asthma 11/12/2015  . Allergic rhinoconjunctivitis 11/12/2015  . LPRD (laryngopharyngeal reflux disease) 11/12/2015  . SOB (shortness of breath) 03/06/2015  . OSA (obstructive sleep apnea) 01/01/2014  . Benign essential HTN 01/01/2014  . Obesity 01/01/2014    Past Surgical History:  Procedure Laterality Date  . BILATERAL BENIGN BREAST BX'S  20 YRS AGO  . CARPAL TUNNEL RELEASE  2009   BILATERAL  . CYSTOCELE REPAIR  12/11/2011   Procedure: ANTERIOR REPAIR (CYSTOCELE);  Surgeon: MClaybon Jabs MD;  [facility]  Service: Urology;  Laterality: N/A;  1 1/2 hour requested for case  Anterior repair and mid Urethral Sling  . NONINVASIVE VASCULAR CAROTID STUDY  09-14-2007   BILATERAL MILD MIX PLAQUE THROUGHOUT, NO SIGNIFICANT BILATERAL ICA STENOSIS  . PUBOVAGINAL SLING  12/11/2011   Procedure: PGaynelle Arabian  Surgeon: MClaybon Jabs MD;  [facility]  Service: Urology;  Laterality: N/A;  . PULLEY RELEASE RIGHT THUMB  2009  . VAGINAL HYSTERECTOMY  AGE 20    OB History    No data available       Home Medications    Prior to Admission medications   Medication Sig Start Date End Date Taking? Authorizing Provider  acetaminophen (TYLENOL) 500 MG tablet Take 1,000 mg by mouth every 6 (six) hours as needed for mild pain.   Yes Historical Provider, MD  albuterol (PROAIR HFA) 108 (90 Base) MCG/ACT inhaler Inhale 2 puffs into the lungs every 4 (four) hours as needed for wheezing or shortness of breath. 05/18/16  Yes EJiles Prows MD  aspirin EC 81 MG tablet Take 81 mg by mouth daily.   Yes Historical Provider, MD  Calcium Carbonate-Vitamin D (CALCIUM PLUS VITAMIN D PO) Take 1 tablet by mouth daily.   Yes Historical Provider, MD  citalopram (  CELEXA) 20 MG tablet 10 mg oral daily 10/17/15  Yes Historical Provider, MD  clonazePAM (KLONOPIN) 1 MG tablet Take 0.5 mg by mouth at bedtime.  09/17/15  Yes Historical Provider, MD  esomeprazole (NEXIUM) 40 MG capsule Take 40 mg by mouth 2 (two) times daily.   Yes Historical Provider, MD  fexofenadine (ALLEGRA) 60 MG tablet Take 60 mg by mouth daily.   Yes Historical Provider, MD  fluticasone (FLONASE) 50 MCG/ACT nasal spray USE 2 SPRAYS IN EACH NOSTRIL DAILY 12/03/15  Yes Jiles Prows, MD  hydrochlorothiazide (HYDRODIURIL) 25 MG tablet Take 25 mg by mouth daily.   Yes Historical Provider, MD  montelukast (SINGULAIR) 10 MG tablet Take 10 mg by mouth at bedtime.   Yes Historical Provider, MD   oxyCODONE-acetaminophen (PERCOCET/ROXICET) 5-325 MG tablet Take 1 tablet by mouth 2 (two) times daily as needed. (pain) 01/28/16  Yes Historical Provider, MD  polyethylene glycol (MIRALAX / GLYCOLAX) packet Take 17 g by mouth daily as needed for mild constipation.    Yes Historical Provider, MD  potassium chloride (MICRO-K) 10 MEQ CR capsule Take 10 mEq by mouth daily. 03/23/15  Yes Historical Provider, MD  ranitidine (ZANTAC) 300 MG tablet TAKE 1 TABLET DAILY AS DIRECTED FOR REFLUX Patient taking differently: take 300 mg oral at bedtime 01/31/16  Yes Jiles Prows, MD  rOPINIRole (REQUIP) 2 MG tablet Take 3 mg by mouth at bedtime.    Yes Historical Provider, MD  zolpidem (AMBIEN) 5 MG tablet Take 5 mg by mouth at bedtime.  09/09/15  Yes Historical Provider, MD    Family History Family History  Problem Relation Age of Onset  . Adopted: Yes  . Diabetes Mother   . Stroke Mother   . Alzheimer's disease Mother   . Hypertension Sister   . Diabetes Sister   . Heart attack Brother   . Leukemia Brother   . CAD Brother   . Diabetes Brother   . Colon cancer Maternal Aunt   . Stroke Paternal Grandmother   . Heart attack Paternal Grandfather     Social History Social History  Substance Use Topics  . Smoking status: Never Smoker  . Smokeless tobacco: Never Used  . Alcohol use No     Allergies   Ceftin [cefuroxime axetil]; Hydroxyzine hcl; Penicillins; and Septra [sulfamethoxazole-trimethoprim]   Review of Systems Review of Systems  All other systems reviewed and are negative.    Physical Exam Updated Vital Signs BP 144/64   Pulse 62   Temp 98.3 F (36.8 C) (Oral)   Resp 18   Wt 196 lb 6.9 oz (89.1 kg)   SpO2 97%   BMI 35.64 kg/m   Physical Exam Physical Exam  Nursing note and vitals reviewed. Constitutional: She is oriented to person, place, and time. She appears well-developed and well-nourished. No distress.  HENT:  Head: Normocephalic and atraumatic.  Eyes: Pupils  are equal, round, and reactive to light.  Neck: Normal range of motion.  Cardiovascular: Normal rate and intact distal pulses.   Pulmonary/Chest: No respiratory distress.  Abdominal: Normal appearance. She exhibits no distension.  Musculoskeletal: Normal range of motion.  Neurological: She is alert and oriented to person, place, and time. No cranial nerve deficit.  No motor or sensory deficit.  No speech difficulty.   Skin: Skin is warm and dry. No rash noted.  Psychiatric: She has a normal mood and affect. Her behavior is normal.    ED Treatments / Results  Labs (all labs  ordered are listed, but only abnormal results are displayed) Labs Reviewed  PROTIME-INR - Abnormal; Notable for the following:       Result Value   Prothrombin Time 25.6 (*)    All other components within normal limits  APTT - Abnormal; Notable for the following:    aPTT >200 (*)    All other components within normal limits  COMPREHENSIVE METABOLIC PANEL - Abnormal; Notable for the following:    Potassium 3.2 (*)    Glucose, Bld 110 (*)    Total Protein 6.3 (*)    All other components within normal limits  CBG MONITORING, ED - Abnormal; Notable for the following:    Glucose-Capillary 113 (*)    All other components within normal limits  I-STAT CHEM 8, ED - Abnormal; Notable for the following:    Potassium 3.1 (*)    Chloride 100 (*)    Glucose, Bld 109 (*)    Calcium, Ion 1.02 (*)    All other components within normal limits  CBC  DIFFERENTIAL  PROTIME-INR  APTT  I-STAT TROPOININ, ED    EKG  EKG Interpretation  Date/Time:  Friday June 26 2016 15:01:12 EDT Ventricular Rate:  63 PR Interval:    QRS Duration: 100 QT Interval:  428 QTC Calculation: 439 R Axis:   28 Text Interpretation:  Sinus rhythm Abnormal T, consider ischemia, lateral leads , new biphasic t-waves in anterior leads Confirmed by Maryan Rued  MD, Loree Fee ([id number]) on 06/29/2016 9:28:35 PM       Radiology No results  found. Laira, Penninger #[id number] (69 y.o. F) A11C    Lab Results     Protime-INR (Final result)  Component (Lab Inquiry)  Collection Time Result Time Prothrombin Time INR  06/26/16 16:30:00 06/26/16 16:48:14 13.3 1.01      APTT (Final result)  Component (Lab Inquiry)  Collection Time Result Time aPTT  06/26/16 16:30:00 06/26/16 16:48:14 30      I-Stat Chem 8, ED (Final result)  Abnormal  Component (Lab Inquiry)  Collection Time Result Time NA K CL BUN Creatinine, Ser  06/26/16 14:52:00 06/26/16 14:51:13 138 3.1 (L) 100 (L) 8 0.80    Collection Time Result Time GLUCOSE CA ION TCO2 HGB HCT  06/26/16 14:52:00 06/26/16 14:51:13 109 (H) 1.02 (L) 25 12.6 37.0      I-stat troponin, ED (Final result)  Component (Lab Inquiry)  Collection Time Result Time Troponin i, poc Comment 3  06/26/16 14:51:00 06/26/16 14:58:16 0.00 Due to the release kinetics of cTnI,  a negative result within the first hours  of the onset of symptoms does not rule out  myocardial infarction with certainty.  If myocardial infarction is still suspected,  repeat the test at appropriate intervals.   ' data-bubble="IP_HOVER_BUBBLE_SERVICE"> Due to the release kin...       Protime-INR (Final result)  Abnormal  Component (Lab Inquiry)  Collection Time Result Time Prothrombin Time INR  06/26/16 14:49:00 06/26/16 15:20:56 25.6 (H) 2.29      APTT (Final result)  Abnormal  Component (Lab Inquiry)  Collection Time Result Time aPTT  06/26/16 14:49:00 06/26/16 15:52:29   IF BASELINE aPTT IS ELEVATED,  SUGGEST PATIENT RISK ASSESSMENT  BE USED TO DETERMINE APPROPRIATE  ANTICOAGULANT THERAPY.  REPEATED TO VERIFY  CRITICAL RESULT CALLED TO, READ BACK BY AND VERIFIED WITH:  RENO,T RN @ 5102 06/26/16 LEONARD,A   ' data-bubble="IP_HOVER_BUBBLE_SERVICE">>200 (HH)  IF BASELINE aP...       CBC (Final result)  Component (Lab Inquiry)  Collection Time Result Time WBC RBC HGB HCT MCV   06/26/16 14:49:00 06/26/16 15:00:43 6.1 4.27 12.6 38.0 89.0    Collection Time Result Time MCH MCHC RDW PLT  06/26/16 14:49:00 06/26/16 15:00:43 29.5 33.2 13.0 219      Differential (Final result)  Component (Lab Inquiry)  Collection Time Result Time NEUTRO PCT AB NEUTRO LYMPHO PCT AB LYM MONO PCT  06/26/16 14:49:00 06/26/16 15:00:43 42 2.6 48 3.0 8    Collection Time Result Time MONO ABS EOS PCT EOSINO ABS BASOS PCT BASOS ABS  06/26/16 14:49:00 06/26/16 15:00:43 0.5 2 0.1 0 0.0      Comprehensive metabolic panel (Final result)  Abnormal  Component (Lab Inquiry)  Collection Time Result Time NA K CL CO2 GLUCOSE  06/26/16 14:49:00 06/26/16 15:56:57 137 3.2 (L) 102 26 110 (H)    Collection Time Result Time BUN Creatinine, Ser CALCIUM PROTEIN Albumin  06/26/16 14:49:00 06/26/16 15:56:57 8 0.66 8.9 6.3 (L) 4.0    Collection Time Result Time AST ALT ALK PHOS BILI TOTL GFR calc non Af Amer  06/26/16 14:49:00 06/26/16 15:56:57 25 17 48 0.7 >60    Collection Time Result Time GFR calc Af Amer Anion gap  06/26/16 14:49:00 06/26/16 15:56:57 (NOTE)  The eGFR has been calculated using the CKD EPI equation.  This calculation has not been validated in all clinical situations.  eGFR's persistently &lte;60 mL/min signify possible Chronic Kidney  Disease.   " data-bubble="IP_HOVER_BUBBLE_SERVICE">>60 (NOTE)  The eGFR has b... 9       CBG monitoring, ED (Final result)  Abnormal  Component (Lab Inquiry)  Collection Time Result Time Glucose-Capillary  06/26/16 14:46:00 06/26/16 14:47:09 113 (H)    Imaging Results     MR Brain Wo Contrast (Final result)  Result time 06/26/16 19:09:39  Final result by Rolla Flatten, MD (06/26/16 19:09:39)           Narrative:   CLINICAL DATA: Slurred speech, unsteady gait, and LEFT-sided weakness beginning earlier today. Neurologic exam suggests possible functional overlay, with multiple inconsistencies. EXAM: MRI HEAD WITHOUT  CONTRAST TECHNIQUE: Multiplanar, multiecho pulse sequences of the brain and surrounding structures were obtained without intravenous contrast. COMPARISON: CT head without contrast earlier today. MR head 04/11/2012. FINDINGS: No evidence for acute infarction, hemorrhage, mass lesion, hydrocephalus, or extra-axial fluid. Normal for age cerebral volume. Mild subcortical and periventricular T2 and FLAIR hyperintensities, likely chronic microvascular ischemic change. Pituitary, pineal, and cerebellar tonsils unremarkable. Incidental partial empty sella. No upper cervical lesions. Flow voids are maintained throughout the carotid, basilar, and vertebral arteries. There are no areas of chronic hemorrhage. LEFT vertebral dominant. Visualized calvarium, skull base, and upper cervical osseous structures unremarkable. Scalp and extracranial soft tissues, orbits, sinuses, and mastoids show no acute process. Chronic LEFT maxillary sinusitis. IMPRESSION: No acute stroke is evident. Chronic changes as described similar to 2013. Chronic LEFT maxillary sinusitis. ENT consultation on an elective basis may be warranted. Electronically Signed By: Staci Righter M.D. On: 06/26/2016 19:09            CT Head Code Stroke W/O CM (Final result)  Result time 06/26/16 14:46:50  Final result by Inez Catalina, MD (06/26/16 14:46:50)           Narrative:   CLINICAL DATA: Right-sided weakness and slurred speech for less than 2 hours EXAM: CT HEAD WITHOUT CONTRAST TECHNIQUE: Contiguous axial images were obtained from the base of the skull through the vertex without intravenous contrast. COMPARISON: 04/11/2012 FINDINGS: The bony calvarium is intact.  Increased opacification of the left maxillary antrum is noted consistent with chronic sinusitis. Rounded area of decreased attenuation is noted within the basal ganglia bilaterally stable from the prior exam. No findings to suggest  acute hemorrhage, acute infarction or space-occupying mass lesion are noted. IMPRESSION: Chronic changes without acute abnormality. No change from the prior study. Critical Value/emergent results were called by telephone at the time of interpretation on 06/26/2016 at 2:43 pm to Dr. Tasia Catchings, who verbally acknowledged these results. Electronically Signed By: Inez Catalina M.D. On: 06/26/2016 14:46          ECG Results     EKG (Final result)  Result time 06/30/16 14:51:01  Final result           Narrative:   Ordered by an unspecified provider.            ED EKG (Final result)  Result time 06/29/16 21:28:46   Procedures Procedures (including critical care time)  Medications Ordered in ED Medications - No data to display   Initial Impression / Assessment and Plan / ED Course  I have reviewed the triage vital signs and the nursing notes.  Pertinent labs & imaging results that were available during my care of the patient were reviewed by me and considered in my medical decision making (see chart for details).  Clinical Course  Value Comment By Time  ED EKG (Reviewed) Leonard Schwartz, MD 07/28 1542  ED EKG (Reviewed) Leonard Schwartz, MD 07/28 1543   After treatment in the ED the patient feels back to baseline and wants to go home.   Final Clinical Impressions(s) / ED Diagnoses   Final diagnoses:  Transient cerebral ischemia, unspecified transient cerebral ischemia type    New Prescriptions Discharge Medication List as of 06/26/2016  7:53 PM       Leonard Schwartz, MD 06/26/16 1936    Leonard Schwartz, MD 07/07/16 1251

## 2016-06-29 ENCOUNTER — Encounter: Payer: Self-pay | Admitting: Cardiology

## 2016-06-29 ENCOUNTER — Encounter: Payer: Medicare PPO | Attending: Internal Medicine

## 2016-06-29 ENCOUNTER — Encounter: Payer: Self-pay | Admitting: Internal Medicine

## 2016-06-29 DIAGNOSIS — I272 Other secondary pulmonary hypertension: Secondary | ICD-10-CM | POA: Insufficient documentation

## 2016-06-29 NOTE — Telephone Encounter (Signed)
MR -Please see attached note from pt regarding recent ED visit and possible TIA. Pt would like to know if she can continue with the [facility] therapy program.  Please advise. Thanks!

## 2016-06-30 ENCOUNTER — Telehealth: Payer: Self-pay

## 2016-06-30 DIAGNOSIS — I272 Pulmonary hypertension, unspecified: Secondary | ICD-10-CM

## 2016-06-30 NOTE — Telephone Encounter (Signed)
Message   Please let her know that she has a stiff heart muscle that comes with age and could cause an elevation in the pressure in the arteries in her lungs. Her workup thus far has rule out chronic blood clots in the lungs, significant COPD. Sleep apnea can also cause pulmonary HTN but her OSA is well treated with CPAP. Her ANA and RF as well as sed rate rule out a rheumatological disorder that also sometimes results in PHTN. I would like her referred to Dr. Aundra Dubin to evaluate for primary pulmonary HTN.    [name]  ----- Message -----  From: Thompson Grayer, RN  Sent: 06/25/2016  1:56 PM  To: Sueanne Margarita, MD  Subject: FW: Visit Follow-Up Question                 ----- Message -----  From: Amy Vang  Sent: 06/25/2016  1:42 PM  To: [facility] Triage  Subject: Visit Follow-Up Question               I have gone through a lot of tests lately - but have not been told my problem. As I was signing in at the [facility]- that Dr. Sheilah Pigeon suggested.... the lady checking me in said I have "Pulmonary Arterial Hypertension". After I looked it up online WebMD.... It sounds scary.    Is this my problem?    Will exercising take care of this problem?  Just a little concerned.  Thank you, Amy Vang patient to review Dr. Theodosia Blender notes and answer questions.  Patient agrees to referral for Dr. Aundra Dubin. She wants to know from Dr. Radford Pax if she should wait to start  Pulmonary Rehab (set up by Dr. Chase Caller) until she is seen by Dr. Aundra Dubin.

## 2016-06-30 NOTE — Telephone Encounter (Signed)
She is fine to start Pulmonary Rehab before seeing Dr. Aundra Dubin

## 2016-07-01 ENCOUNTER — Encounter: Payer: Medicare PPO | Attending: Internal Medicine

## 2016-07-01 ENCOUNTER — Telehealth: Payer: Self-pay | Admitting: Cardiology

## 2016-07-01 DIAGNOSIS — I272 Other secondary pulmonary hypertension: Secondary | ICD-10-CM | POA: Insufficient documentation

## 2016-07-01 NOTE — Telephone Encounter (Signed)
New message ° ° ° ° ° ° °Pt returning nurse call  °

## 2016-07-01 NOTE — Telephone Encounter (Signed)
Informed patient there is no documentation of anyone calling her today, but a MyChart message was sent informing her it is fine to start Pulmonary Rehab prior to seeing Dr. Aundra Dubin. She was grateful for call back.

## 2016-07-01 NOTE — Telephone Encounter (Signed)
Per patient request, MyChart message sent informing her that is it fine to start Pulmonary Rehab prior to seeing Dr. Aundra Dubin.

## 2016-07-06 ENCOUNTER — Encounter: Payer: Medicare PPO | Admitting: *Deleted

## 2016-07-06 ENCOUNTER — Encounter: Payer: Self-pay | Admitting: *Deleted

## 2016-07-06 DIAGNOSIS — G4730 Sleep apnea, unspecified: Secondary | ICD-10-CM

## 2016-07-06 DIAGNOSIS — I2721 Secondary pulmonary arterial hypertension: Secondary | ICD-10-CM

## 2016-07-06 DIAGNOSIS — I272 Other secondary pulmonary hypertension: Secondary | ICD-10-CM | POA: Diagnosis present

## 2016-07-06 NOTE — Progress Notes (Signed)
Pulmonary Individual Treatment Plan  Patient Details  Name: Amy Vang MRN: [id number] Date of Birth: [birth year] Referring Provider:   Flowsheet Row Pulmonary Rehab from 06/23/2016 in [facility] Cardiac and Pulmonary Rehab  Referring Provider  Ramaswamy      Initial Encounter Date:  Flowsheet Row Pulmonary Rehab from 06/23/2016 in [facility] Cardiac and Pulmonary Rehab  Date  06/23/16  Referring Provider  Ramaswamy      Visit Diagnosis: PAH (pulmonary artery hypertension) ([place])  Sleep apnea  Patient's Home Medications on Admission:  Current Outpatient Prescriptions:  .  acetaminophen (TYLENOL) 500 MG tablet, Take 1,000 mg by mouth every 6 (six) hours as needed for mild pain., Disp: , Rfl:  .  albuterol (PROAIR HFA) 108 (90 Base) MCG/ACT inhaler, Inhale 2 puffs into the lungs every 4 (four) hours as needed for wheezing or shortness of breath., Disp: 1 Inhaler, Rfl: 0 .  aspirin EC 81 MG tablet, Take 81 mg by mouth daily., Disp: , Rfl:  .  Calcium Carbonate-Vitamin D (CALCIUM PLUS VITAMIN D PO), Take 1 tablet by mouth daily., Disp: , Rfl:  .  citalopram (CELEXA) 20 MG tablet, 10 mg oral daily, Disp: , Rfl: 2 .  clonazePAM (KLONOPIN) 1 MG tablet, Take 0.5 mg by mouth at bedtime. , Disp: , Rfl:  .  esomeprazole (NEXIUM) 40 MG capsule, Take 40 mg by mouth 2 (two) times daily., Disp: , Rfl:  .  fexofenadine (ALLEGRA) 60 MG tablet, Take 60 mg by mouth daily., Disp: , Rfl:  .  fluticasone (FLONASE) 50 MCG/ACT nasal spray, USE 2 SPRAYS IN EACH NOSTRIL DAILY, Disp: 48 g, Rfl: 1 .  hydrochlorothiazide (HYDRODIURIL) 25 MG tablet, Take 25 mg by mouth daily., Disp: , Rfl:  .  montelukast (SINGULAIR) 10 MG tablet, Take 10 mg by mouth at bedtime., Disp: , Rfl:  .  oxyCODONE-acetaminophen (PERCOCET/ROXICET) 5-325 MG tablet, Take 1 tablet by mouth 2 (two) times daily as needed. (pain), Disp: , Rfl: 0 .  polyethylene glycol (MIRALAX / GLYCOLAX) packet, Take 17 g by mouth daily as needed for mild  constipation. , Disp: , Rfl:  .  potassium chloride (MICRO-K) 10 MEQ CR capsule, Take 10 mEq by mouth daily., Disp: , Rfl:  .  ranitidine (ZANTAC) 300 MG tablet, TAKE 1 TABLET DAILY AS DIRECTED FOR REFLUX (Patient taking differently: take 300 mg oral at bedtime), Disp: 90 tablet, Rfl: 0 .  rOPINIRole (REQUIP) 2 MG tablet, Take 3 mg by mouth at bedtime. , Disp: , Rfl:  .  zolpidem (AMBIEN) 5 MG tablet, Take 5 mg by mouth at bedtime. , Disp: , Rfl:   Current Facility-Administered Medications:  .  triamcinolone acetonide (KENALOG) 10 MG/ML injection 10 mg, 10 mg, Other, Once, Trula Slade, DPM  Past Medical History: Past Medical History:  Diagnosis Date  . Acid reflux   . Actinic keratosis    Precancerous  . Allergic rhinitis   . Anxiety   . Asthma   . CVA (cerebral infarction)   . Cystocele   . Degenerative disc disease, cervical   . Diastolic dysfunction   . GERD (gastroesophageal reflux disease) PT STATES SEVERE---- CONTROLLED W/ NEXIUM  . History of TIAs AGE 22  AND 2008---  NO RESIDUAL  . Hypertension   . Non-specific gastrointestinal complaint   . OSA (obstructive sleep apnea)    Severe CPAP 12 MM H2O- Dr Radford Pax  . Pulmonary HTN ([place])    Moderate with PASP 1mHg by echo 03/2016  . Restless  leg syndrome   . Rosacea   . SUI (stress urinary incontinence, female)     Tobacco Use: History  Smoking Status  . Never Smoker  Smokeless Tobacco  . Never Used    Labs: Recent Review Flowsheet Data    Labs for ITP Cardiac and Pulmonary Rehab Latest Ref Rng & Units 09/14/2007 04/11/2012 04/12/2012 06/26/2016   Cholestrol 0 - 200 mg/dL 171 ATP III CLASSIFICATION: <200     mg/dL   Desirable 200-239  mg/dL   Borderline High >=240    mg/dL   High - 142 -   LDLCALC 0 - 99 mg/dL 100 Total Cholesterol/HDL:CHD Risk Coronary Heart Disease Risk Table Men   Women 1/2 Average Risk   3.4   3.3(H) - 75 -   HDL >39 mg/dL 29(L) - 29(L) -   Trlycerides <150 mg/dL 208(H) - 192(H) -    Hemoglobin A1c <5.7 % 5.4 (NOTE)   The ADA recommends the following therapeutic goals for glycemic   control related to Hgb A1C measurement:   Goal of Therapy:   < 7.0% Hgb A1C   Action Suggested:  > 8.0% Hgb A1C   Ref:  Diabetes Care, 22, Suppl. 1, 1999 - 6.0(H) -   PHART - 7.372 - - -   PCO2ART - 45.7(H) - - -   HCO3 - 26.6(H) - - -   TCO2 0 - 100 mmol/L 28 27 - 25   O2SAT - 96.0 - - -       ADL UCSD:     Pulmonary Assessment Scores    Row Name 06/23/16 1213         ADL UCSD   ADL Phase Entry     SOB Score total 67     Rest 0     Walk 1     Stairs 5     Bath 0     Dress 0     Shop 5        Pulmonary Function Assessment:     Pulmonary Function Assessment - 06/23/16 1211      Pulmonary Function Tests   FVC% 89 %   FEV1% 94 %   FEV1/FVC Ratio 80   DLCO% 64 %     Initial Spirometry Results   Comments Test date: 04/16/16     Breath   Bilateral Breath Sounds Clear   Shortness of Breath Yes      Exercise Target Goals:    Exercise Program Goal: Individual exercise prescription set with THRR, safety & activity barriers. Participant demonstrates ability to understand and report RPE using BORG scale, to self-measure pulse accurately, and to acknowledge the importance of the exercise prescription.  Exercise Prescription Goal: Starting with aerobic activity 30 plus minutes a day, 3 days per week for initial exercise prescription. Provide home exercise prescription and guidelines that participant acknowledges understanding prior to discharge.  Activity Barriers & Risk Stratification:     Activity Barriers & Cardiac Risk Stratification - 06/23/16 1210      Activity Barriers & Cardiac Risk Stratification   Activity Barriers Deconditioning;Shortness of Breath   Cardiac Risk Stratification Moderate      6 Minute Walk:     6 Minute Walk    Row Name 06/23/16 1536         6 Minute Walk   Distance 1352.5 feet     Walk Time 6 minutes     # of Rest Breaks 0      MPH 2.56  METS 3.19     RPE 12     Perceived Dyspnea  3     VO2 Peak 11.17     Symptoms No        Initial Exercise Prescription:     Initial Exercise Prescription - 06/23/16 1500      Date of Initial Exercise RX and Referring Provider   Date 06/23/16   Referring Provider Ramaswamy     Treadmill   MPH 2.5   Grade 0.5   Minutes 15   METs 3.09     NuStep   Level 2   Watts 50   Minutes 15   METs 3     REL-XR   Level 1   Watts 50   Minutes 15   METs 3     Prescription Details   Frequency (times per week) 3   Duration Progress to 45 minutes of aerobic exercise without signs/symptoms of physical distress     Intensity   THRR 40-80% of Max Heartrate 102-135   Ratings of Perceived Exertion 11-15   Perceived Dyspnea 0-4     Progression   Progression Continue to progress workloads to maintain intensity without signs/symptoms of physical distress.     Resistance Training   Training Prescription Yes   Weight 2   Reps 10-15      Perform Capillary Blood Glucose checks as needed.  Exercise Prescription Changes:     Exercise Prescription Changes    Row Name 07/06/16 1400             Response to Exercise   Blood Pressure (Admit) 160/80       Blood Pressure (Exercise) 166/70       Blood Pressure (Exit) 134/76       Heart Rate (Admit) 72 bpm       Heart Rate (Exercise) 103 bpm       Heart Rate (Exit) 75 bpm       Oxygen Saturation (Admit) 97 %       Oxygen Saturation (Exercise) 94 %       Oxygen Saturation (Exit) 95 %       Rating of Perceived Exertion (Exercise) 13       Perceived Dyspnea (Exercise) 3.5       Duration Progress to 30 minutes of continuous aerobic without signs/symptoms of physical distress       Intensity THRR unchanged  102-135         Progression   Progression Continue progressive overload as per policy without signs/symptoms or physical distress.         Resistance Training   Training Prescription Yes       Weight 2        Reps 10-15         Treadmill   MPH 2       Grade 0       Minutes 15       METs 2.5         NuStep   Level 2       Watts 50       Minutes 15       METs 3         REL-XR   Level 1       Watts 50       Minutes 15       METs 3          Exercise Comments:     Exercise Comments  [place] Name 07/06/16 1441           Exercise Comments  First full day of exercise!  Patient was oriented to gym and equipment including functions, settings, policies, and procedures.  Patient's individual exercise prescription and treatment plan were reviewed.  All starting workloads were established based on the results of the 6 minute walk test done at initial orientation visit.  The plan for exercise progression was also introduced and progression will be customized based on patient's performance and goals.          Discharge Exercise Prescription (Final Exercise Prescription Changes):     Exercise Prescription Changes - 07/06/16 1400      Response to Exercise   Blood Pressure (Admit) 160/80   Blood Pressure (Exercise) 166/70   Blood Pressure (Exit) 134/76   Heart Rate (Admit) 72 bpm   Heart Rate (Exercise) 103 bpm   Heart Rate (Exit) 75 bpm   Oxygen Saturation (Admit) 97 %   Oxygen Saturation (Exercise) 94 %   Oxygen Saturation (Exit) 95 %   Rating of Perceived Exertion (Exercise) 13   Perceived Dyspnea (Exercise) 3.5   Duration Progress to 30 minutes of continuous aerobic without signs/symptoms of physical distress   Intensity THRR unchanged  102-135     Progression   Progression Continue progressive overload as per policy without signs/symptoms or physical distress.     Resistance Training   Training Prescription Yes   Weight 2   Reps 10-15     Treadmill   MPH 2   Grade 0   Minutes 15   METs 2.5     NuStep   Level 2   Watts 50   Minutes 15   METs 3     REL-XR   Level 1   Watts 50   Minutes 15   METs 3       Nutrition:  Target Goals: Understanding of  nutrition guidelines, daily intake of sodium <1588m, cholesterol <2035m calories 30% from fat and 7% or less from saturated fats, daily to have 5 or more servings of fruits and vegetables.  Biometrics:     Pre Biometrics - 06/23/16 1535      Pre Biometrics   Height 5' 2.25" (1.581 Vang)   Weight 190 lb 12.8 oz (86.5 kg)   Waist Circumference 40.25 inches   Hip Circumference 44.5 inches   Waist to Hip Ratio 0.9 %   BMI (Calculated) 34.7       Nutrition Therapy Plan and Nutrition Goals:     Nutrition Therapy & Goals - 06/23/16 1218      Nutrition Therapy   Diet Amy Vang like to meet with the dietitian. Her portion sizes are moderate amounts; moderate amount of salt; very few sweets; and she drinks 40+ ounces of water.      Nutrition Discharge: Rate Your Plate Scores:   Psychosocial: Target Goals: Acknowledge presence or absence of depression, maximize coping skills, provide positive support system. Participant is able to verbalize types and ability to use techniques and skills needed for reducing stress and depression.  Initial Review & Psychosocial Screening:     Initial Psych Review & Screening - 06/23/16 1228      Family Dynamics   Good Support System? Yes   Comments Amy Amy Vang have good family support from her 2 children and husband. She does have trouble sleeping, although she is very compliant with her CPAP. She  has some anxiousness about her PHA diagnosis  and is interested in learning more about this diagnosis.  iagnosis and lac     Barriers   Psychosocial barriers to participate in program The patient should benefit from training in stress management and relaxation.     Screening Interventions   Interventions Encouraged to exercise;Program counselor consult      Quality of Life Scores:     Quality of Life - 06/23/16 1232      Quality of Life Scores   Health/Function Pre 21 %   Socioeconomic Pre 20.88 %   Psych/Spiritual Pre 21 %   Family  Pre 21 %   GLOBAL Pre 20.81 %      PHQ-9: Recent Review Flowsheet Data    Depression screen [facility] 2/9 06/23/2016   Decreased Interest 1   Down, Depressed, Hopeless 1   PHQ - 2 Score 2   Altered sleeping 3   Tired, decreased energy 2   Change in appetite 0   Feeling bad or failure about yourself  0   Trouble concentrating 0   Moving slowly or fidgety/restless 0   Suicidal thoughts 0   PHQ-9 Score 7   Difficult doing work/chores Somewhat difficult      Psychosocial Evaluation and Intervention:     Psychosocial Evaluation - 07/06/16 1021      Psychosocial Evaluation & Interventions   Comments Counselor met with Amy Vang (Amy Vang) today for initial psychosocial evaluation.  She is a 70 year old female who has some pulmonary and possible cardiology issues and is seeing a Dr. to confirm her Dx later this month.   She has a strong support system with a spouse of 52 years and a son and daughter who live close by as well as active involvement in her local church community.  Amy Vang states she had a TIA (1) week ago and has been released to attend this program by her Dr.  She also has sleep apnea and typically sleeps 6-7 hours per night with the help of medications prescribed, and her CPAP machine.  Amy Vang reports having a history of depression/anxiety and is on medication for this currently that is managing the symptoms adequately.  She states she is typically in a positive mood and has minimal stress in her life other than some concern for her health and her spouse.  Amy Vang has goals to improve her breathing; lose weight and decrease the number of medications she is taking while in this program.  Counselor will be following with Amy Vang throughout the course of Tx.     Continued Psychosocial Services Needed Yes  Amy Vang will benefit from the psychoeducational components of this program, as well as meeting with the dietician to address her weight loss goals.        Psychosocial  Re-Evaluation:  Education: Education Goals: Education classes will be provided on a weekly basis, covering required topics. Participant will state understanding/return demonstration of topics presented.  Learning Barriers/Preferences:     Learning Barriers/Preferences - 06/23/16 1211      Learning Barriers/Preferences   Learning Barriers None   Learning Preferences Group Instruction;Individual Instruction;Pictoral;Skilled Demonstration;Verbal Instruction;Video;Written Material      Education Topics: Initial Evaluation Education: - Verbal, written and demonstration of respiratory meds, RPE/PD scales, oximetry and breathing techniques. Instruction on use of nebulizers and MDIs: cleaning and proper use, rinsing mouth with steroid doses and importance of monitoring MDI activations. Flowsheet Row Pulmonary Rehab from 07/06/2016 in [facility] Cardiac and Pulmonary Rehab  Date  06/23/16  Educator  LB  Instruction Review Code  2- meets goals/outcomes      General Nutrition Guidelines/Fats and Fiber: -Group instruction provided by verbal, written material, models and posters to present the general guidelines for heart healthy nutrition. Gives an explanation and review of dietary fats and fiber.   Controlling Sodium/Reading Food Labels: -Group verbal and written material supporting the discussion of sodium use in heart healthy nutrition. Review and explanation with models, verbal and written materials for utilization of the food label.   Exercise Physiology & Risk Factors: - Group verbal and written instruction with models to review the exercise physiology of the cardiovascular system and associated critical values. Details cardiovascular disease risk factors and the goals associated with each risk factor.   Aerobic Exercise & Resistance Training: - Gives group verbal and written discussion on the health impact of inactivity. On the components of aerobic and resistive training programs and the  benefits of this training and how to safely progress through these programs.   Flexibility, Balance, General Exercise Guidelines: - Provides group verbal and written instruction on the benefits of flexibility and balance training programs. Provides general exercise guidelines with specific guidelines to those with heart or lung disease. Demonstration and skill practice provided.   Stress Management: - Provides group verbal and written instruction about the health risks of elevated stress, cause of high stress, and healthy ways to reduce stress.   Depression: - Provides group verbal and written instruction on the correlation between heart/lung disease and depressed mood, treatment options, and the stigmas associated with seeking treatment.   Exercise & Equipment Safety: - Individual verbal instruction and demonstration of equipment use and safety with use of the equipment. Flowsheet Row Pulmonary Rehab from 07/06/2016 in [facility] Cardiac and Pulmonary Rehab  Date  07/06/16  Educator  [facility]  Instruction Review Code  2- meets goals/outcomes      Infection Prevention: - Provides verbal and written material to individual with discussion of infection control including proper hand washing and proper equipment cleaning during exercise session. Flowsheet Row Pulmonary Rehab from 07/06/2016 in [facility] Cardiac and Pulmonary Rehab  Date  07/06/16  Educator  [facility]  Instruction Review Code  2- meets goals/outcomes      Falls Prevention: - Provides verbal and written material to individual with discussion of falls prevention and safety. Flowsheet Row Pulmonary Rehab from 07/06/2016 in [facility] Cardiac and Pulmonary Rehab  Date  06/23/16  Educator  LB  Instruction Review Code  2- meets goals/outcomes      Diabetes: - Individual verbal and written instruction to review signs/symptoms of diabetes, desired ranges of glucose level fasting, after meals and with exercise. Advice that pre and post exercise glucose  checks will be done for 3 sessions at entry of program.   Chronic Lung Diseases: - Group verbal and written instruction to review new updates, new respiratory medications, new advancements in procedures and treatments. Provide informative websites and "800" numbers of self-education.   Lung Procedures: - Group verbal and written instruction to describe testing methods done to diagnose lung disease. Review the outcome of test results. Describe the treatment choices: Pulmonary Function Tests, ABGs and oximetry.   Energy Conservation: - Provide group verbal and written instruction for methods to conserve energy, plan and organize activities. Instruct on pacing techniques, use of adaptive equipment and posture/positioning to relieve shortness of breath.   Triggers: - Group verbal and written instruction to review types of environmental controls: home humidity, furnaces, filters, dust mite/pet prevention, HEPA  vacuums. To discuss weather changes, air quality and the benefits of nasal washing.   Exacerbations: - Group verbal and written instruction to provide: warning signs, infection symptoms, calling MD promptly, preventive modes, and value of vaccinations. Review: effective airway clearance, coughing and/or vibration techniques. Create an Sports administrator.   Oxygen: - Individual and group verbal and written instruction on oxygen therapy. Includes supplement oxygen, available portable oxygen systems, continuous and intermittent flow rates, oxygen safety, concentrators, and Medicare reimbursement for oxygen.   Respiratory Medications: - Group verbal and written instruction to review medications for lung disease. Drug class, frequency, complications, importance of spacers, rinsing mouth after steroid MDI's, and proper cleaning methods for nebulizers. Flowsheet Row Pulmonary Rehab from 07/06/2016 in [facility] Cardiac and Pulmonary Rehab  Date  06/23/16  Educator  LB  Instruction Review Code  2- meets  goals/outcomes      AED/CPR: - Group verbal and written instruction with the use of models to demonstrate the basic use of the AED with the basic ABC's of resuscitation.   Breathing Retraining: - Provides individuals verbal and written instruction on purpose, frequency, and proper technique of diaphragmatic breathing and pursed-lipped breathing. Applies individual practice skills. Flowsheet Row Pulmonary Rehab from 07/06/2016 in [facility] Cardiac and Pulmonary Rehab  Date  07/06/16  Educator  [facility]  Instruction Review Code  2- meets goals/outcomes      Anatomy and Physiology of the Lungs: - Group verbal and written instruction with the use of models to provide basic lung anatomy and physiology related to function, structure and complications of lung disease.   Heart Failure: - Group verbal and written instruction on the basics of heart failure: signs/symptoms, treatments, explanation of ejection fraction, enlarged heart and cardiomyopathy.   Sleep Apnea: - Individual verbal and written instruction to review Obstructive Sleep Apnea. Review of risk factors, methods for diagnosing and types of masks and machines for OSA. Flowsheet Row Pulmonary Rehab from 07/06/2016 in [facility] Cardiac and Pulmonary Rehab  Date  06/23/16  Educator  LB  Instruction Review Code  2- meets goals/outcomes      Anxiety: - Provides group, verbal and written instruction on the correlation between heart/lung disease and anxiety, treatment options, and management of anxiety.   Relaxation: - Provides group, verbal and written instruction about the benefits of relaxation for patients with heart/lung disease. Also provides patients with examples of relaxation techniques.   Knowledge Questionnaire Score:     Knowledge Questionnaire Score - 06/23/16 1211      Knowledge Questionnaire Score   Pre Score 8/10       Core Components/Risk Factors/Patient Goals at Admission:     Personal Goals and Risk Factors at  Admission - 06/23/16 1223      Core Components/Risk Factors/Patient Goals on Admission    Weight Management Yes;Weight Loss  Amy Amy Vang would like to meet with the dietitian. Her portion sizes are moderate amounts; moderate amount of salt; very few sweets; and she drinks 40+ ounces of water   Intervention Weight Management: Develop a combined nutrition and exercise program designed to reach desired caloric intake, while maintaining appropriate intake of nutrient and fiber, sodium and fats, and appropriate energy expenditure required for the weight goal.;Weight Management: Provide education and appropriate resources to help participant work on and attain dietary goals.;Weight Management/Obesity: Establish reasonable short term and long term weight goals.;Obesity: Provide education and appropriate resources to help participant work on and attain dietary goals.   Admit Weight 190 lb 12.8 oz (86.5 kg)  Goal Weight: Short Term 170 lb (77.1 kg)   Goal Weight: Long Term 160 lb (72.6 kg)   Expected Outcomes Short Term: Continue to assess and modify interventions until short term weight is achieved;Long Term: Adherence to nutrition and physical activity/exercise program aimed toward attainment of established weight goal;Weight Loss: Understanding of general recommendations for a balanced deficit meal plan, which promotes 1-2 lb weight loss per week and includes a negative energy balance of [phone] kcal/d;Understanding recommendations for meals to include 15-35% energy as protein, 25-35% energy from fat, 35-60% energy from carbohydrates, less than 245m of dietary cholesterol, 20-35 gm of total fiber daily;Understanding of distribution of calorie intake throughout the day with the consumption of 4-5 meals/snacks   Sedentary Yes  Member of [employer]   Intervention Provide advice, education, support and counseling about physical activity/exercise needs.;Develop an individualized exercise prescription for  aerobic and resistive training based on initial evaluation findings, risk stratification, comorbidities and participant's personal goals.   Expected Outcomes Achievement of increased cardiorespiratory fitness and enhanced flexibility, muscular endurance and strength shown through measurements of functional capacity and personal statement of participant.   Increase Strength and Stamina Yes   Intervention Provide advice, education, support and counseling about physical activity/exercise needs.;Develop an individualized exercise prescription for aerobic and resistive training based on initial evaluation findings, risk stratification, comorbidities and participant's personal goals.   Expected Outcomes Achievement of increased cardiorespiratory fitness and enhanced flexibility, muscular endurance and strength shown through measurements of functional capacity and personal statement of participant.   Improve shortness of breath with ADL's Yes   Intervention Provide education, individualized exercise plan and daily activity instruction to help decrease symptoms of SOB with activities of daily living.   Expected Outcomes Short Term: Achieves a reduction of symptoms when performing activities of daily living.   Develop more efficient breathing techniques such as purse lipped breathing and diaphragmatic breathing; and practicing self-pacing with activity Yes   Intervention Provide education, demonstration and support about specific breathing techniuqes utilized for more efficient breathing. Include techniques such as pursed lipped breathing, diaphragmatic breathing and self-pacing activity.   Expected Outcomes Short Term: Participant will be able to demonstrate and use breathing techniques as needed throughout daily activities.   Increase knowledge of respiratory medications and ability to use respiratory devices properly  Yes  Uses Albuterol MDI rarely, although carries in her purse. CPAP at 12cmH2O pressure,  nasal mask, very compliant.   Intervention Provide education and demonstration as needed of appropriate use of medications, inhalers, and oxygen therapy.   Expected Outcomes Short Term: Achieves understanding of medications use. Understands that oxygen is a medication prescribed by physician. Demonstrates appropriate use of inhaler and oxygen therapy.   Hypertension Yes   Intervention Provide education on lifestyle modifcations including regular physical activity/exercise, weight management, moderate sodium restriction and increased consumption of fresh fruit, vegetables, and low fat dairy, alcohol moderation, and smoking cessation.;Monitor prescription use compliance.   Expected Outcomes Short Term: Continued assessment and intervention until BP is < 140/913mHG in hypertensive participants. < 130/8014mG in hypertensive participants with diabetes, heart failure or chronic kidney disease.;Long Term: Maintenance of blood pressure at goal levels.   Lipids Yes   Intervention Provide education and support for participant on nutrition & aerobic/resistive exercise along with prescribed medications to achieve LDL <6m99mDL >40mg85mExpected Outcomes Short Term: Participant states understanding of desired cholesterol values and is compliant with medications prescribed. Participant is following exercise prescription and nutrition guidelines.;Long Term: Cholesterol  controlled with medications as prescribed, with individualized exercise RX and with personalized nutrition plan. Value goals: LDL < 40m, HDL > 40 mg.      Core Components/Risk Factors/Patient Goals Review:      Goals and Risk Factor Review    Row Name 07/06/16 1430             Core Components/Risk Factors/Patient Goals Review   Personal Goals Review Develop more efficient breathing techniques such as purse lipped breathing and diaphragmatic breathing and practicing self-pacing with activity.       Review PLB was discussed with patient as  to how it can help control SOB during exercise and daily activities. The patient demonstrated understanding of this technique.        Expected Outcomes The patient will continue to use PLB during exercise class as well as during daily activites to better control SOB and thus be able to increase strength and stamina.           Core Components/Risk Factors/Patient Goals at Discharge (Final Review):      Goals and Risk Factor Review - 07/06/16 1430      Core Components/Risk Factors/Patient Goals Review   Personal Goals Review Develop more efficient breathing techniques such as purse lipped breathing and diaphragmatic breathing and practicing self-pacing with activity.   Review PLB was discussed with patient as to how it can help control SOB during exercise and daily activities. The patient demonstrated understanding of this technique.    Expected Outcomes The patient will continue to use PLB during exercise class as well as during daily activites to better control SOB and thus be able to increase strength and stamina.       ITP Comments:     ITP Comments    Row Name 07/06/16 1441           ITP Comments The patient did experience chest pain of 6.5-7 out of 10 while walking on the TM. When workloads were decreased the pain improved, and when the TM was stopped the pain was completely resolved. She did not exerperience these symptoms on the other machines. TM walking speed will be decreased to below her pain threshold. She is in close contact with her doctor about these symptoms and has an appointment with a cardiologist this month.           Comments: first day/ 30 day review

## 2016-07-06 NOTE — Progress Notes (Signed)
Daily Session Note  Patient Details  Name: Amy Vang MRN: [id number] Date of Birth: July 01, 1946 Referring Provider:   [facility] from 06/23/2016 in [facility]  Referring Provider  Ramaswamy      Encounter Date: 07/06/2016  Check In:     Session Check In - 07/06/16 1429      Check-In   Location [facility]   Staff Present Carson Myrtle, BS, RRT, Respiratory Therapist;Pascuala Klutts Amedeo Plenty, BS, ACSM CEP, Exercise Physiologist;Jessica Luan Pulling, MA, ACSM RCEP, Exercise Physiologist   Supervising physician immediately available to respond to emergencies [facility] immediately available ER MD   Physician(s) Burlene Arnt and Kinner   Medication changes reported     No   Fall or balance concerns reported    No   Warm-up and Cool-down Performed on first and last piece of equipment   Resistance Training Performed Yes   VAD Patient? No     Pain Assessment   Currently in Pain? No/denies   Multiple Pain Sites No           Exercise Prescription Changes - 07/06/16 1400      Response to Exercise   Blood Pressure (Admit) 160/80   Blood Pressure (Exercise) 166/70   Blood Pressure (Exit) 134/76   Heart Rate (Admit) 72 bpm   Heart Rate (Exercise) 103 bpm   Heart Rate (Exit) 75 bpm   Oxygen Saturation (Admit) 97 %   Oxygen Saturation (Exercise) 94 %   Oxygen Saturation (Exit) 95 %   Rating of Perceived Exertion (Exercise) 13   Perceived Dyspnea (Exercise) 3.5   Duration Progress to 30 minutes of continuous aerobic without signs/symptoms of physical distress   Intensity THRR unchanged  102-135     Progression   Progression Continue progressive overload as per policy without signs/symptoms or physical distress.     Resistance Training   Training Prescription Yes   Weight 2   Reps 10-15     Treadmill   MPH 2   Grade 0   Minutes 15   METs 2.5     NuStep   Level 2   Watts 50   Minutes 15   METs 3     REL-XR   Level 1   Watts 50   Minutes 15   METs 3      Goals Met:  Proper associated with RPD/PD & O2 Sat Personal goals reviewed Strength training completed today  Goals Unmet:  Not Applicable  Comments: First full day of exercise!  Patient was oriented to gym and equipment including functions, settings, policies, and procedures.  Patient's individual exercise prescription and treatment plan were reviewed.  All starting workloads were established based on the results of the 6 minute walk test done at initial orientation visit.  The plan for exercise progression was also introduced and progression will be customized based on patient's performance and goals.  The patient did experience chest pain of 6.5-7 out of 10 while walking on the TM. When workloads were decreased the pain improved, and when the TM was stopped the pain was completely resolved. She did not exerperience these symptoms on the other machines. TM walking speed will be decreased to below her pain threshold. She is in close contact with her doctor about these symptoms and has an appointment with a cardiologist this month.     Dr. Emily Filbert is Medical Director for [facility] and [facility].

## 2016-07-06 NOTE — Progress Notes (Signed)
Pulmonary Individual Treatment Plan  Patient Details  Name: Amy Vang MRN: [id number] Date of Birth: [birth year] Referring Provider:   Flowsheet Row Pulmonary Rehab from 06/23/2016 in [facility] Cardiac and Pulmonary Rehab  Referring Provider  Ramaswamy      Initial Encounter Date:  Flowsheet Row Pulmonary Rehab from 06/23/2016 in [facility] Cardiac and Pulmonary Rehab  Date  06/23/16  Referring Provider  Ramaswamy      Visit Diagnosis: PAH (pulmonary artery hypertension) ([place])  Sleep apnea  Patient's Home Medications on Admission:  Current Outpatient Prescriptions:  .  acetaminophen (TYLENOL) 500 MG tablet, Take 1,000 mg by mouth every 6 (six) hours as needed for mild pain., Disp: , Rfl:  .  albuterol (PROAIR HFA) 108 (90 Base) MCG/ACT inhaler, Inhale 2 puffs into the lungs every 4 (four) hours as needed for wheezing or shortness of breath., Disp: 1 Inhaler, Rfl: 0 .  aspirin EC 81 MG tablet, Take 81 mg by mouth daily., Disp: , Rfl:  .  Calcium Carbonate-Vitamin D (CALCIUM PLUS VITAMIN D PO), Take 1 tablet by mouth daily., Disp: , Rfl:  .  citalopram (CELEXA) 20 MG tablet, 10 mg oral daily, Disp: , Rfl: 2 .  clonazePAM (KLONOPIN) 1 MG tablet, Take 0.5 mg by mouth at bedtime. , Disp: , Rfl:  .  esomeprazole (NEXIUM) 40 MG capsule, Take 40 mg by mouth 2 (two) times daily., Disp: , Rfl:  .  fexofenadine (ALLEGRA) 60 MG tablet, Take 60 mg by mouth daily., Disp: , Rfl:  .  fluticasone (FLONASE) 50 MCG/ACT nasal spray, USE 2 SPRAYS IN EACH NOSTRIL DAILY, Disp: 48 g, Rfl: 1 .  hydrochlorothiazide (HYDRODIURIL) 25 MG tablet, Take 25 mg by mouth daily., Disp: , Rfl:  .  montelukast (SINGULAIR) 10 MG tablet, Take 10 mg by mouth at bedtime., Disp: , Rfl:  .  oxyCODONE-acetaminophen (PERCOCET/ROXICET) 5-325 MG tablet, Take 1 tablet by mouth 2 (two) times daily as needed. (pain), Disp: , Rfl: 0 .  polyethylene glycol (MIRALAX / GLYCOLAX) packet, Take 17 g by mouth daily as needed for mild  constipation. , Disp: , Rfl:  .  potassium chloride (MICRO-K) 10 MEQ CR capsule, Take 10 mEq by mouth daily., Disp: , Rfl:  .  ranitidine (ZANTAC) 300 MG tablet, TAKE 1 TABLET DAILY AS DIRECTED FOR REFLUX (Patient taking differently: take 300 mg oral at bedtime), Disp: 90 tablet, Rfl: 0 .  rOPINIRole (REQUIP) 2 MG tablet, Take 3 mg by mouth at bedtime. , Disp: , Rfl:  .  zolpidem (AMBIEN) 5 MG tablet, Take 5 mg by mouth at bedtime. , Disp: , Rfl:   Current Facility-Administered Medications:  .  triamcinolone acetonide (KENALOG) 10 MG/ML injection 10 mg, 10 mg, Other, Once, Amy Vang, DPM  Past Medical History: Past Medical History:  Diagnosis Date  . Acid reflux   . Actinic keratosis    Precancerous  . Allergic rhinitis   . Anxiety   . Asthma   . CVA (cerebral infarction)   . Cystocele   . Degenerative disc disease, cervical   . Diastolic dysfunction   . GERD (gastroesophageal reflux disease) PT STATES SEVERE---- CONTROLLED W/ NEXIUM  . History of TIAs AGE 22  AND 2008---  NO RESIDUAL  . Hypertension   . Non-specific gastrointestinal complaint   . OSA (obstructive sleep apnea)    Severe CPAP 12 MM H2O- Dr Radford Pax  . Pulmonary HTN ([place])    Moderate with PASP 1mHg by echo 03/2016  . Restless  leg syndrome   . Rosacea   . SUI (stress urinary incontinence, female)     Tobacco Use: History  Smoking Status  . Never Smoker  Smokeless Tobacco  . Never Used    Labs: Recent Review Flowsheet Data    Labs for ITP Cardiac and Pulmonary Rehab Latest Ref Rng & Units 09/14/2007 04/11/2012 04/12/2012 06/26/2016   Cholestrol 0 - 200 mg/dL 171 ATP III CLASSIFICATION: <200     mg/dL   Desirable 200-239  mg/dL   Borderline High >=240    mg/dL   High - 142 -   LDLCALC 0 - 99 mg/dL 100 Total Cholesterol/HDL:CHD Risk Coronary Heart Disease Risk Table Men   Women 1/2 Average Risk   3.4   3.3(H) - 75 -   HDL >39 mg/dL 29(L) - 29(L) -   Trlycerides <150 mg/dL 208(H) - 192(H) -    Hemoglobin A1c <5.7 % 5.4 (NOTE)   The ADA recommends the following therapeutic goals for glycemic   control related to Hgb A1C measurement:   Goal of Therapy:   < 7.0% Hgb A1C   Action Suggested:  > 8.0% Hgb A1C   Ref:  Diabetes Care, 22, Suppl. 1, 1999 - 6.0(H) -   PHART - 7.372 - - -   PCO2ART - 45.7(H) - - -   HCO3 - 26.6(H) - - -   TCO2 0 - 100 mmol/L 28 27 - 25   O2SAT - 96.0 - - -       ADL UCSD:     Pulmonary Assessment Scores    Row Name 06/23/16 1213         ADL UCSD   ADL Phase Entry     SOB Score total 67     Rest 0     Walk 1     Stairs 5     Bath 0     Dress 0     Shop 5        Pulmonary Function Assessment:     Pulmonary Function Assessment - 06/23/16 1211      Pulmonary Function Tests   FVC% 89 %   FEV1% 94 %   FEV1/FVC Ratio 80   DLCO% 64 %     Initial Spirometry Results   Comments Test date: 04/16/16     Breath   Bilateral Breath Sounds Clear   Shortness of Breath Yes      Exercise Target Goals:    Exercise Program Goal: Individual exercise prescription set with THRR, safety & activity barriers. Participant demonstrates ability to understand and report RPE using BORG scale, to self-measure pulse accurately, and to acknowledge the importance of the exercise prescription.  Exercise Prescription Goal: Starting with aerobic activity 30 plus minutes a day, 3 days per week for initial exercise prescription. Provide home exercise prescription and guidelines that participant acknowledges understanding prior to discharge.  Activity Barriers & Risk Stratification:     Activity Barriers & Cardiac Risk Stratification - 06/23/16 1210      Activity Barriers & Cardiac Risk Stratification   Activity Barriers Deconditioning;Shortness of Breath   Cardiac Risk Stratification Moderate      6 Minute Walk:     6 Minute Walk    Row Name 06/23/16 1536         6 Minute Walk   Distance 1352.5 feet     Walk Time 6 minutes     # of Rest Breaks 0      MPH 2.56  METS 3.19     RPE 12     Perceived Dyspnea  3     VO2 Peak 11.17     Symptoms No        Initial Exercise Prescription:     Initial Exercise Prescription - 06/23/16 1500      Date of Initial Exercise RX and Referring Provider   Date 06/23/16   Referring Provider Ramaswamy     Treadmill   MPH 2.5   Grade 0.5   Minutes 15   METs 3.09     NuStep   Level 2   Watts 50   Minutes 15   METs 3     REL-XR   Level 1   Watts 50   Minutes 15   METs 3     Prescription Details   Frequency (times per week) 3   Duration Progress to 45 minutes of aerobic exercise without signs/symptoms of physical distress     Intensity   THRR 40-80% of Max Heartrate 102-135   Ratings of Perceived Exertion 11-15   Perceived Dyspnea 0-4     Progression   Progression Continue to progress workloads to maintain intensity without signs/symptoms of physical distress.     Resistance Training   Training Prescription Yes   Weight 2   Reps 10-15      Perform Capillary Blood Glucose checks as needed.  Exercise Prescription Changes:   Exercise Comments:   Discharge Exercise Prescription (Final Exercise Prescription Changes):    Nutrition:  Target Goals: Understanding of nutrition guidelines, daily intake of sodium 1500mg , cholesterol 200mg , calories 30% from fat and 7% or less from saturated fats, daily to have 5 or more servings of fruits and vegetables.  Biometrics:     Pre Biometrics - 06/23/16 1535      Pre Biometrics   Height 5' 2.25" (1.581 m)   Weight 190 lb 12.8 oz (86.5 kg)   Waist Circumference 40.25 inches   Hip Circumference 44.5 inches   Waist to Hip Ratio 0.9 %   BMI (Calculated) 34.7       Nutrition Therapy Plan and Nutrition Goals:     Nutrition Therapy & Goals - 06/23/16 1218      Nutrition Therapy   Diet Ms Ernestina Columbia would like to meet with the dietitian. Her portion sizes are moderate amounts; moderate amount of salt; very few sweets; and  she drinks 40+ ounces of water.      Nutrition Discharge: Rate Your Plate Scores:   Psychosocial: Target Goals: Acknowledge presence or absence of depression, maximize coping skills, provide positive support system. Participant is able to verbalize types and ability to use techniques and skills needed for reducing stress and depression.  Initial Review & Psychosocial Screening:     Initial Psych Review & Screening - 06/23/16 1228      Family Dynamics   Good Support System? Yes   Comments Ms [place] does have good family support from her 2 children and husband. She does have trouble sleeping, although she is very compliant with her CPAP. She  has some anxiousness about her PHA diagnosis and is interested in learning more about this diagnosis.  iagnosis and lac     Barriers   Psychosocial barriers to participate in program The patient should benefit from training in stress management and relaxation.     Screening Interventions   Interventions Encouraged to exercise;Program counselor consult      Quality of Life Scores:     Quality of  Life - 06/23/16 1232      Quality of Life Scores   Health/Function Pre 21 %   Socioeconomic Pre 20.88 %   Psych/Spiritual Pre 21 %   Family Pre 21 %   GLOBAL Pre 20.81 %      PHQ-9: Recent Review Flowsheet Data    Depression screen [facility] 2/9 06/23/2016   Decreased Interest 1   Down, Depressed, Hopeless 1   PHQ - 2 Score 2   Altered sleeping 3   Tired, decreased energy 2   Change in appetite 0   Feeling bad or failure about yourself  0   Trouble concentrating 0   Moving slowly or fidgety/restless 0   Suicidal thoughts 0   PHQ-9 Score 7   Difficult doing work/chores Somewhat difficult      Psychosocial Evaluation and Intervention:   Psychosocial Re-Evaluation:  Education: Education Goals: Education classes will be provided on a weekly basis, covering required topics. Participant will state understanding/return demonstration of  topics presented.  Learning Barriers/Preferences:     Learning Barriers/Preferences - 06/23/16 1211      Learning Barriers/Preferences   Learning Barriers None   Learning Preferences Group Instruction;Individual Instruction;Pictoral;Skilled Demonstration;Verbal Instruction;Video;Written Material      Education Topics: Initial Evaluation Education: - Verbal, written and demonstration of respiratory meds, RPE/PD scales, oximetry and breathing techniques. Instruction on use of nebulizers and MDIs: cleaning and proper use, rinsing mouth with steroid doses and importance of monitoring MDI activations. Flowsheet Row Pulmonary Rehab from 06/23/2016 in [facility] Cardiac and Pulmonary Rehab  Date  06/23/16  Educator  LB  Instruction Review Code  2- meets goals/outcomes      General Nutrition Guidelines/Fats and Fiber: -Group instruction provided by verbal, written material, models and posters to present the general guidelines for heart healthy nutrition. Gives an explanation and review of dietary fats and fiber.   Controlling Sodium/Reading Food Labels: -Group verbal and written material supporting the discussion of sodium use in heart healthy nutrition. Review and explanation with models, verbal and written materials for utilization of the food label.   Exercise Physiology & Risk Factors: - Group verbal and written instruction with models to review the exercise physiology of the cardiovascular system and associated critical values. Details cardiovascular disease risk factors and the goals associated with each risk factor.   Aerobic Exercise & Resistance Training: - Gives group verbal and written discussion on the health impact of inactivity. On the components of aerobic and resistive training programs and the benefits of this training and how to safely progress through these programs.   Flexibility, Balance, General Exercise Guidelines: - Provides group verbal and written instruction on the  benefits of flexibility and balance training programs. Provides general exercise guidelines with specific guidelines to those with heart or lung disease. Demonstration and skill practice provided.   Stress Management: - Provides group verbal and written instruction about the health risks of elevated stress, cause of high stress, and healthy ways to reduce stress.   Depression: - Provides group verbal and written instruction on the correlation between heart/lung disease and depressed mood, treatment options, and the stigmas associated with seeking treatment.   Exercise & Equipment Safety: - Individual verbal instruction and demonstration of equipment use and safety with use of the equipment.   Infection Prevention: - Provides verbal and written material to individual with discussion of infection control including proper hand washing and proper equipment cleaning during exercise session.   Falls Prevention: - Provides verbal and written material to  individual with discussion of falls prevention and safety. Flowsheet Row Pulmonary Rehab from 06/23/2016 in [facility] Cardiac and Pulmonary Rehab  Date  06/23/16  Educator  LB  Instruction Review Code  2- meets goals/outcomes      Diabetes: - Individual verbal and written instruction to review signs/symptoms of diabetes, desired ranges of glucose level fasting, after meals and with exercise. Advice that pre and post exercise glucose checks will be done for 3 sessions at entry of program.   Chronic Lung Diseases: - Group verbal and written instruction to review new updates, new respiratory medications, new advancements in procedures and treatments. Provide informative websites and "800" numbers of self-education.   Lung Procedures: - Group verbal and written instruction to describe testing methods done to diagnose lung disease. Review the outcome of test results. Describe the treatment choices: Pulmonary Function Tests, ABGs and  oximetry.   Energy Conservation: - Provide group verbal and written instruction for methods to conserve energy, plan and organize activities. Instruct on pacing techniques, use of adaptive equipment and posture/positioning to relieve shortness of breath.   Triggers: - Group verbal and written instruction to review types of environmental controls: home humidity, furnaces, filters, dust mite/pet prevention, HEPA vacuums. To discuss weather changes, air quality and the benefits of nasal washing.   Exacerbations: - Group verbal and written instruction to provide: warning signs, infection symptoms, calling MD promptly, preventive modes, and value of vaccinations. Review: effective airway clearance, coughing and/or vibration techniques. Create an Sports administrator.   Oxygen: - Individual and group verbal and written instruction on oxygen therapy. Includes supplement oxygen, available portable oxygen systems, continuous and intermittent flow rates, oxygen safety, concentrators, and Medicare reimbursement for oxygen.   Respiratory Medications: - Group verbal and written instruction to review medications for lung disease. Drug class, frequency, complications, importance of spacers, rinsing mouth after steroid MDI's, and proper cleaning methods for nebulizers. Flowsheet Row Pulmonary Rehab from 06/23/2016 in [facility] Cardiac and Pulmonary Rehab  Date  06/23/16  Educator  LB  Instruction Review Code  2- meets goals/outcomes      AED/CPR: - Group verbal and written instruction with the use of models to demonstrate the basic use of the AED with the basic ABC's of resuscitation.   Breathing Retraining: - Provides individuals verbal and written instruction on purpose, frequency, and proper technique of diaphragmatic breathing and pursed-lipped breathing. Applies individual practice skills. Flowsheet Row Pulmonary Rehab from 06/23/2016 in [facility] Cardiac and Pulmonary Rehab  Date  06/23/16  Educator  LB   Instruction Review Code  2- meets goals/outcomes      Anatomy and Physiology of the Lungs: - Group verbal and written instruction with the use of models to provide basic lung anatomy and physiology related to function, structure and complications of lung disease.   Heart Failure: - Group verbal and written instruction on the basics of heart failure: signs/symptoms, treatments, explanation of ejection fraction, enlarged heart and cardiomyopathy.   Sleep Apnea: - Individual verbal and written instruction to review Obstructive Sleep Apnea. Review of risk factors, methods for diagnosing and types of masks and machines for OSA. Flowsheet Row Pulmonary Rehab from 06/23/2016 in [facility] Cardiac and Pulmonary Rehab  Date  06/23/16  Educator  LB  Instruction Review Code  2- meets goals/outcomes      Anxiety: - Provides group, verbal and written instruction on the correlation between heart/lung disease and anxiety, treatment options, and management of anxiety.   Relaxation: - Provides group, verbal and written instruction about  the benefits of relaxation for patients with heart/lung disease. Also provides patients with examples of relaxation techniques.   Knowledge Questionnaire Score:     Knowledge Questionnaire Score - 06/23/16 1211      Knowledge Questionnaire Score   Pre Score 8/10       Core Components/Risk Factors/Patient Goals at Admission:     Personal Goals and Risk Factors at Admission - 06/23/16 1223      Core Components/Risk Factors/Patient Goals on Admission    Weight Management Yes;Weight Loss  Ms Ernestina Columbia would like to meet with the dietitian. Her portion sizes are moderate amounts; moderate amount of salt; very few sweets; and she drinks 40+ ounces of water   Intervention Weight Management: Develop a combined nutrition and exercise program designed to reach desired caloric intake, while maintaining appropriate intake of nutrient and fiber, sodium and fats, and  appropriate energy expenditure required for the weight goal.;Weight Management: Provide education and appropriate resources to help participant work on and attain dietary goals.;Weight Management/Obesity: Establish reasonable short term and long term weight goals.;Obesity: Provide education and appropriate resources to help participant work on and attain dietary goals.   Admit Weight 190 lb 12.8 oz (86.5 kg)   Goal Weight: Short Term 170 lb (77.1 kg)   Goal Weight: Long Term 160 lb (72.6 kg)   Expected Outcomes Short Term: Continue to assess and modify interventions until short term weight is achieved;Long Term: Adherence to nutrition and physical activity/exercise program aimed toward attainment of established weight goal;Weight Loss: Understanding of general recommendations for a balanced deficit meal plan, which promotes 1-2 lb weight loss per week and includes a negative energy balance of [phone] kcal/d;Understanding recommendations for meals to include 15-35% energy as protein, 25-35% energy from fat, 35-60% energy from carbohydrates, less than 200mg  of dietary cholesterol, 20-35 gm of total fiber daily;Understanding of distribution of calorie intake throughout the day with the consumption of 4-5 meals/snacks   Sedentary Yes  Member of [employer]   Intervention Provide advice, education, support and counseling about physical activity/exercise needs.;Develop an individualized exercise prescription for aerobic and resistive training based on initial evaluation findings, risk stratification, comorbidities and participant's personal goals.   Expected Outcomes Achievement of increased cardiorespiratory fitness and enhanced flexibility, muscular endurance and strength shown through measurements of functional capacity and personal statement of participant.   Increase Strength and Stamina Yes   Intervention Provide advice, education, support and counseling about physical activity/exercise needs.;Develop  an individualized exercise prescription for aerobic and resistive training based on initial evaluation findings, risk stratification, comorbidities and participant's personal goals.   Expected Outcomes Achievement of increased cardiorespiratory fitness and enhanced flexibility, muscular endurance and strength shown through measurements of functional capacity and personal statement of participant.   Improve shortness of breath with ADL's Yes   Intervention Provide education, individualized exercise plan and daily activity instruction to help decrease symptoms of SOB with activities of daily living.   Expected Outcomes Short Term: Achieves a reduction of symptoms when performing activities of daily living.   Develop more efficient breathing techniques such as purse lipped breathing and diaphragmatic breathing; and practicing self-pacing with activity Yes   Intervention Provide education, demonstration and support about specific breathing techniuqes utilized for more efficient breathing. Include techniques such as pursed lipped breathing, diaphragmatic breathing and self-pacing activity.   Expected Outcomes Short Term: Participant will be able to demonstrate and use breathing techniques as needed throughout daily activities.   Increase knowledge of respiratory medications and ability to  use respiratory devices properly  Yes  Uses Albuterol MDI rarely, although carries in her purse. CPAP at 12cmH2O pressure, nasal mask, very compliant.   Intervention Provide education and demonstration as needed of appropriate use of medications, inhalers, and oxygen therapy.   Expected Outcomes Short Term: Achieves understanding of medications use. Understands that oxygen is a medication prescribed by physician. Demonstrates appropriate use of inhaler and oxygen therapy.   Hypertension Yes   Intervention Provide education on lifestyle modifcations including regular physical activity/exercise, weight management, moderate  sodium restriction and increased consumption of fresh fruit, vegetables, and low fat dairy, alcohol moderation, and smoking cessation.;Monitor prescription use compliance.   Expected Outcomes Short Term: Continued assessment and intervention until BP is < 140/64mm HG in hypertensive participants. < 130/73mm HG in hypertensive participants with diabetes, heart failure or chronic kidney disease.;Long Term: Maintenance of blood pressure at goal levels.   Lipids Yes   Intervention Provide education and support for participant on nutrition & aerobic/resistive exercise along with prescribed medications to achieve LDL 70mg , HDL >40mg .   Expected Outcomes Short Term: Participant states understanding of desired cholesterol values and is compliant with medications prescribed. Participant is following exercise prescription and nutrition guidelines.;Long Term: Cholesterol controlled with medications as prescribed, with individualized exercise RX and with personalized nutrition plan. Value goals: LDL < 70mg , HDL > 40 mg.      Core Components/Risk Factors/Patient Goals Review:    Core Components/Risk Factors/Patient Goals at Discharge (Final Review):    ITP Comments:   Comments: 30 Day Review

## 2016-07-07 ENCOUNTER — Encounter: Payer: Self-pay | Admitting: Podiatry

## 2016-07-07 ENCOUNTER — Ambulatory Visit (INDEPENDENT_AMBULATORY_CARE_PROVIDER_SITE_OTHER): Payer: Medicare PPO | Admitting: Podiatry

## 2016-07-07 ENCOUNTER — Ambulatory Visit (INDEPENDENT_AMBULATORY_CARE_PROVIDER_SITE_OTHER): Payer: BC Managed Care – PPO

## 2016-07-07 VITALS — BP 152/74 | HR 79 | Resp 12

## 2016-07-07 DIAGNOSIS — M79672 Pain in left foot: Secondary | ICD-10-CM

## 2016-07-07 DIAGNOSIS — M7752 Other enthesopathy of left foot: Secondary | ICD-10-CM

## 2016-07-07 DIAGNOSIS — M10271 Drug-induced gout, right ankle and foot: Secondary | ICD-10-CM | POA: Diagnosis not present

## 2016-07-07 DIAGNOSIS — M7751 Other enthesopathy of right foot: Secondary | ICD-10-CM

## 2016-07-07 LAB — CBC WITH DIFFERENTIAL/PLATELET
BASOS ABS: 0 {cells}/uL (ref 0–200)
Basophils Relative: 0 %
EOS ABS: 69 {cells}/uL (ref 15–500)
EOS PCT: 1 %
HCT: 36.8 % (ref 35.0–45.0)
HEMOGLOBIN: 12.4 g/dL (ref 11.7–15.5)
LYMPHS ABS: 2622 {cells}/uL (ref 850–3900)
Lymphocytes Relative: 38 %
MCH: 29.2 pg (ref 27.0–33.0)
MCHC: 33.7 g/dL (ref 32.0–36.0)
MCV: 86.8 fL (ref 80.0–100.0)
MPV: 10 fL (ref 7.5–12.5)
Monocytes Absolute: 690 cells/uL (ref 200–950)
Monocytes Relative: 10 %
NEUTROS PCT: 51 %
Neutro Abs: 3519 cells/uL (ref 1500–7800)
Platelets: 214 10*3/uL (ref 140–400)
RBC: 4.24 MIL/uL (ref 3.80–5.10)
RDW: 13.4 % (ref 11.0–15.0)
WBC: 6.9 10*3/uL (ref 3.8–10.8)

## 2016-07-07 NOTE — Progress Notes (Signed)
   Subjective:    Patient ID: Amy Vang, female    DOB: 12-28-1945, 70 y.o.   MRN: [id number]  HPI: She presents today with a 2 day duration of pain to the right ankle. She states that she was working out for pulmonary rehabilitation and her foot was sliding off of the paddles. She states that the day after that she noticed her right ankle was swollen red hot and painful. She states it hurts to touch the flexor to bend it she's tried ice to no avail. She denies any other trauma. Does not recall rolling her foot.    Review of Systems  Cardiovascular: Positive for leg swelling.  Musculoskeletal: Positive for joint swelling.       Objective:   Physical Exam: Vital signs are stable she is alert and oriented 3. Pulses are palpable. Neurologic sensorium is intact. Deep tendon reflexes are intact bilateral. Muscle strength is 5 over 5 dorsiflexion plantar flexors and inverters everters all just musculatures intact. Red hot swollen area overlying the sinus tarsi of the right foot exquisitely tender on palpation radiographs do not demonstrate any type of osseus abnormalities in this area taken today. 3 views of the ankle appears to be normal.  Assessment: Cannot rule out gouty capsulitis right ankle lateral.  Plan: I injected the area today with dexamethasone Kenalog and local anesthetic. Also requested an arthritic profile. Follow up with her with these results.        Assessment & Plan:

## 2016-07-08 ENCOUNTER — Telehealth: Payer: Self-pay | Admitting: Respiratory Therapy

## 2016-07-08 ENCOUNTER — Encounter: Payer: Self-pay | Admitting: Respiratory Therapy

## 2016-07-08 DIAGNOSIS — I2721 Secondary pulmonary arterial hypertension: Secondary | ICD-10-CM

## 2016-07-08 DIAGNOSIS — G4730 Sleep apnea, unspecified: Secondary | ICD-10-CM

## 2016-07-08 LAB — ANA: ANA: NEGATIVE

## 2016-07-08 LAB — URIC ACID: URIC ACID, SERUM: 6.6 mg/dL (ref 2.5–7.0)

## 2016-07-08 LAB — RHEUMATOID FACTOR: Rhuematoid fact SerPl-aCnc: <10 IU/mL (ref <=14)

## 2016-07-08 LAB — SEDIMENTATION RATE: SED RATE: 1 mm/h (ref 0–30)

## 2016-07-08 LAB — C-REACTIVE PROTEIN: CRP: 0.9 mg/dL — AB (ref <0.60)

## 2016-07-09 ENCOUNTER — Telehealth: Payer: Self-pay | Admitting: *Deleted

## 2016-07-09 NOTE — Telephone Encounter (Signed)
Dr. Milinda Pointer states the arthritic panel was normal and would see pt at her next scheduled appt.  Informed pt of Dr. Stephenie Acres orders.

## 2016-07-14 ENCOUNTER — Institutional Professional Consult (permissible substitution): Payer: Medicare PPO | Admitting: Internal Medicine

## 2016-07-18 NOTE — Telephone Encounter (Signed)
Ok to do rehab

## 2016-07-20 ENCOUNTER — Telehealth: Payer: Self-pay | Admitting: Respiratory Therapy

## 2016-07-20 ENCOUNTER — Encounter: Payer: Self-pay | Admitting: Respiratory Therapy

## 2016-07-20 ENCOUNTER — Encounter: Payer: Self-pay | Admitting: Internal Medicine

## 2016-07-20 ENCOUNTER — Encounter: Payer: Self-pay | Admitting: Cardiology

## 2016-07-20 DIAGNOSIS — G4730 Sleep apnea, unspecified: Secondary | ICD-10-CM

## 2016-07-20 DIAGNOSIS — I2721 Secondary pulmonary arterial hypertension: Secondary | ICD-10-CM

## 2016-07-20 NOTE — Telephone Encounter (Signed)
Responded to pt making her aware of clearance to return to exercise.   Called and spoke with Laureen at [facility] to make aware, asked that we fax a script pad stating pt is cleared to return to exercise through program to [phone] attn: Laureen.  This has been sent.  Nothing further needed.

## 2016-07-21 NOTE — Telephone Encounter (Signed)
MyChartMessage:  Dr. Chase Caller, please advise.

## 2016-07-22 NOTE — Telephone Encounter (Signed)
Wow; sorry to hear Amy Vang   Triage: please inform rehab of this   I will see her per fu in oct 2017    Dr. Brand Males, M.D., [facility].C.P Pulmonary and Critical Care Medicine Staff Physician [place] [facility] Pager: [phone], If no answer or between  15:00h - 7:00h: call [phone]  07/22/2016 2:45 PM

## 2016-07-22 NOTE — Telephone Encounter (Signed)
Called pulmonary rehab at [facility], spoke with Angela Nevin who reported that the nurses Ivar Drape) are already aware of pt's ankle pain  E-mail sent to patient thanking her for keeping [place] up to date Will sign off

## 2016-07-29 ENCOUNTER — Encounter (HOSPITAL_COMMUNITY): Payer: Self-pay | Admitting: *Deleted

## 2016-07-29 ENCOUNTER — Other Ambulatory Visit (HOSPITAL_COMMUNITY): Payer: Self-pay | Admitting: *Deleted

## 2016-07-29 ENCOUNTER — Encounter: Payer: Self-pay | Admitting: *Deleted

## 2016-07-29 ENCOUNTER — Ambulatory Visit (HOSPITAL_COMMUNITY)
Admission: RE | Admit: 2016-07-29 | Discharge: 2016-07-29 | Disposition: A | Discharge status: Home / Self Care | Payer: Medicare PPO | Source: Ambulatory Visit | Attending: Cardiology | Admitting: Cardiology

## 2016-07-29 VITALS — BP 156/72 | HR 67 | Wt 193.2 lb

## 2016-07-29 DIAGNOSIS — Z7982 Long term (current) use of aspirin: Secondary | ICD-10-CM | POA: Diagnosis not present

## 2016-07-29 DIAGNOSIS — J45909 Unspecified asthma, uncomplicated: Secondary | ICD-10-CM | POA: Diagnosis not present

## 2016-07-29 DIAGNOSIS — Z8249 Family history of ischemic heart disease and other diseases of the circulatory system: Secondary | ICD-10-CM | POA: Diagnosis not present

## 2016-07-29 DIAGNOSIS — I10 Essential (primary) hypertension: Secondary | ICD-10-CM | POA: Diagnosis not present

## 2016-07-29 DIAGNOSIS — I272 Other secondary pulmonary hypertension: Secondary | ICD-10-CM

## 2016-07-29 DIAGNOSIS — R0602 Shortness of breath: Secondary | ICD-10-CM | POA: Diagnosis not present

## 2016-07-29 DIAGNOSIS — R0609 Other forms of dyspnea: Secondary | ICD-10-CM | POA: Insufficient documentation

## 2016-07-29 DIAGNOSIS — K219 Gastro-esophageal reflux disease without esophagitis: Secondary | ICD-10-CM | POA: Insufficient documentation

## 2016-07-29 DIAGNOSIS — R0789 Other chest pain: Secondary | ICD-10-CM | POA: Insufficient documentation

## 2016-07-29 DIAGNOSIS — G4730 Sleep apnea, unspecified: Secondary | ICD-10-CM

## 2016-07-29 DIAGNOSIS — G4733 Obstructive sleep apnea (adult) (pediatric): Secondary | ICD-10-CM | POA: Insufficient documentation

## 2016-07-29 DIAGNOSIS — Z79899 Other long term (current) drug therapy: Secondary | ICD-10-CM | POA: Diagnosis not present

## 2016-07-29 DIAGNOSIS — I2721 Secondary pulmonary arterial hypertension: Secondary | ICD-10-CM

## 2016-07-29 NOTE — Patient Instructions (Signed)
Right Heart Catheterization on Tuesday 08/11/16, see instruction sheet  Your physician recommends that you schedule a follow-up appointment in: 2-3 weeks

## 2016-07-30 NOTE — Progress Notes (Signed)
PCP: Dr. Carol Ada Cardiology: Dr. Radford Pax Pulmonology: Dr. Chase Caller HF Cardiology: Dr. Aundra Dubin  70 yo with history of OSA, asthma, HTN, and several months of unexplained exertional dyspnea presents for evaluation of pulmonary hypertension.  She says that for 6-9 months, she has had dyspnea walking to mailbox and walking up steps.  Also with moderate housework like making up beds.  Dyspnea bending over.  Some days are better than others.  She also gets tightness in her chest from time to time, not always with exertion.  She always uses her CPAP.  She has been diagnosed with asthma in the past but says that inhalers have not helped her symptoms. She also has GERD but takes Nexium twice a day for control.    She has had extensive workup for her dyspnea.  Echo showed elevated PA pressure at 46 mmHg with normal LV and RV systolic function.  CTA chest showed no PE or interstitial lung disease.  PFTs were normal except for decreased DLCO.  Cardiolite showed no ischemia or infarction.  CPX showed near-normal functional capacity, primarily limited by body habitus.  She was sent to me for consideration of right heart heart cath.   ECG (7/17): NSR, t wave flattening anterolaterally  Labs (5/17): BP 26 Labs (7/17): creatinine 0.66  PMH: 1. GERD 2. OSA: Uses CPAP. 3. Asthma 4. HTN 5. 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 6. Chronic diyspnea: For about a year. - Echo (5/17) with EF [id number], grade I diastolic dysfunction, moderate TR, RV normal size/systolic function, PASP 46 mmHg. - CTA chest (5/17) with no PE, normal lung parenchyma (no ILD).  - Lexiscan Cardiolite (6/17) with EF 72%, no ischemia/infarction.  - CPX (7/17) with RER 1.17, peak VO2 15.9, VE/VCO2 slope 31.5 => normal functional capacity, limited mostly by body habitus. - PFTs (5/17) with normal spirometry but mildly decreased DLCO.   SH: Married, mother of Earnstine Regal, nonsmoker, no ETOH.   FH: Grandfather with MI, brother with MI  in his 63s. Mother with atrial fibrillation.   ROS: All systems reviewed and negative except as per HPI.   Current Outpatient Prescriptions  Medication Sig Dispense Refill  . aspirin EC 81 MG tablet Take 81 mg by mouth daily.    . Calcium Carbonate-Vitamin D (CALCIUM PLUS VITAMIN D PO) Take 1 tablet by mouth daily.    . citalopram (CELEXA) 20 MG tablet 10 mg oral daily  2  . clonazePAM (KLONOPIN) 1 MG tablet Take 0.5 mg by mouth at bedtime.     Marland Kitchen esomeprazole (NEXIUM) 40 MG capsule Take 40 mg by mouth 2 (two) times daily.    . fexofenadine (ALLEGRA) 60 MG tablet Take 60 mg by mouth daily.    . hydrochlorothiazide (HYDRODIURIL) 25 MG tablet Take 25 mg by mouth daily.    . montelukast (SINGULAIR) 10 MG tablet Take 10 mg by mouth at bedtime.    . polyethylene glycol (MIRALAX / GLYCOLAX) packet Take 17 g by mouth daily as needed for mild constipation.     . potassium chloride (MICRO-K) 10 MEQ CR capsule Take 10 mEq by mouth daily.    . ranitidine (ZANTAC) 300 MG tablet TAKE 1 TABLET DAILY AS DIRECTED FOR REFLUX (Patient taking differently: take 300 mg oral at bedtime) 90 tablet 0  . rOPINIRole (REQUIP) 2 MG tablet Take 3 mg by mouth at bedtime.     Marland Kitchen zolpidem (AMBIEN) 5 MG tablet Take 5 mg by mouth at bedtime.  Current Facility-Administered Medications  Medication Dose Route Frequency Provider Last Rate Last Dose  . triamcinolone acetonide (KENALOG) 10 MG/ML injection 10 mg  10 mg Other Once Trula Slade, DPM       BP (!) 156/72 (BP Location: Right Arm, Patient Position: Sitting, Cuff Size: Normal)   Pulse 67   Wt 193 lb 4 oz (87.7 kg)   SpO2 95%   BMI 35.06 kg/m  General: NAD Neck: No JVD, no thyromegaly or thyroid nodule.  Lungs: Clear to auscultation bilaterally with normal respiratory effort. CV: Nondisplaced PMI.  Heart regular S1/S2, no S3/S4, no murmur.  No peripheral edema.  No carotid bruit.  Normal pedal pulses.  Abdomen: Soft, nontender, no hepatosplenomegaly, no  distention.  Skin: Intact without lesions or rashes.  Neurologic: Alert and oriented x 3.  Psych: Normal affect. Extremities: No clubbing or cyanosis.  HEENT: Normal.   Assessment/Plan: 1. Chronic exertional dyspnea/atypical chest tightness: She has dyspnea of uncertain etiology for at least 6 months.  She has had an extensive workup as detailed above.  No evidence by Cardiolite for obstructive coronary disease, no evidence for PE or interstitial lung disease, normal LV and RV systolic function on echo and normal BNP in past, CPX showed near normal functional capacity, primarily limited by body habitus.  However, PA pressure was elevated by echo at 46 mmHg and DLCO was low on PFTs, suggesting possibility of pulmonary vascular disease/pulmonary hypertension.  Finally, it is possible that symptoms are due to asthma and possibly a component of deconditioning.  Inhalers provided by her allergist have not been particularly helpful.  She has GERD that has been treated, but has not affected her breathing to treat GERD.  - I do think that her symptoms and echo/PFTs warrant an evaluation for pulmonary artery hypertension.  I am going to arrange for RHC.  We discussed the risk/benefits of test today and she agrees to proceed.  2. ?TIAs: Patient have had several episodes of weakness, sometimes bilateral.  There has been concern for possible TIAs but neurology saw the last time she was in the ER and seems to think that TIA diagnosis is somewhat equivocal.  MRI head negative.  Would consider workup for paroxysmal atrial fibrillation with event monitor in future.  She is on ASA 81 daily.  3. HTN: Controlled on HCTZ.   Loralie Champagne 07/30/2016 9:34 AM]

## 2016-07-31 ENCOUNTER — Encounter: Payer: Medicare PPO | Attending: Internal Medicine

## 2016-07-31 DIAGNOSIS — I272 Other secondary pulmonary hypertension: Secondary | ICD-10-CM | POA: Insufficient documentation

## 2016-08-04 ENCOUNTER — Telehealth (HOSPITAL_COMMUNITY): Payer: Self-pay | Admitting: *Deleted

## 2016-08-04 ENCOUNTER — Encounter: Payer: Self-pay | Admitting: *Deleted

## 2016-08-04 DIAGNOSIS — I2721 Secondary pulmonary arterial hypertension: Secondary | ICD-10-CM

## 2016-08-04 DIAGNOSIS — G4730 Sleep apnea, unspecified: Secondary | ICD-10-CM

## 2016-08-04 NOTE — Telephone Encounter (Signed)
[place] approval [id number]

## 2016-08-04 NOTE — Progress Notes (Signed)
Pulmonary Individual Treatment Plan  Patient Details  Name: Amy Vang MRN: [id number] Date of Birth: 03/29/1946 Referring Provider:   April Manson Pulmonary Rehab from 06/23/2016 in [facility] Cardiac and Pulmonary Rehab  Referring Provider  Ramaswamy      Initial Encounter Date:  Flowsheet Row Pulmonary Rehab from 06/23/2016 in [facility] Cardiac and Pulmonary Rehab  Date  06/23/16  Referring Provider  Ramaswamy      Visit Diagnosis: PAH (pulmonary artery hypertension) ([place])  Sleep apnea  Patient's Home Medications on Admission:  Current Outpatient Prescriptions:  .  aspirin EC 81 MG tablet, Take 81 mg by mouth daily., Disp: , Rfl:  .  Calcium Carbonate-Vitamin D (CALCIUM PLUS VITAMIN D PO), Take 1 tablet by mouth daily., Disp: , Rfl:  .  citalopram (CELEXA) 20 MG tablet, 10 mg oral daily, Disp: , Rfl: 2 .  clonazePAM (KLONOPIN) 1 MG tablet, Take 0.5 mg by mouth at bedtime. , Disp: , Rfl:  .  esomeprazole (NEXIUM) 40 MG capsule, Take 40 mg by mouth 2 (two) times daily., Disp: , Rfl:  .  fexofenadine (ALLEGRA) 60 MG tablet, Take 60 mg by mouth daily., Disp: , Rfl:  .  hydrochlorothiazide (HYDRODIURIL) 25 MG tablet, Take 25 mg by mouth daily., Disp: , Rfl:  .  montelukast (SINGULAIR) 10 MG tablet, Take 10 mg by mouth at bedtime., Disp: , Rfl:  .  polyethylene glycol (MIRALAX / GLYCOLAX) packet, Take 17 g by mouth daily as needed for mild constipation. , Disp: , Rfl:  .  potassium chloride (MICRO-K) 10 MEQ CR capsule, Take 10 mEq by mouth daily., Disp: , Rfl:  .  ranitidine (ZANTAC) 300 MG tablet, TAKE 1 TABLET DAILY AS DIRECTED FOR REFLUX (Patient taking differently: take 300 mg oral at bedtime), Disp: 90 tablet, Rfl: 0 .  rOPINIRole (REQUIP) 2 MG tablet, Take 3 mg by mouth at bedtime. , Disp: , Rfl:  .  zolpidem (AMBIEN) 5 MG tablet, Take 5 mg by mouth at bedtime. , Disp: , Rfl:   Current Facility-Administered Medications:  .  triamcinolone acetonide (KENALOG) 10 MG/ML injection 10  mg, 10 mg, Other, Once, Trula Slade, DPM  Past Medical History: Past Medical History:  Diagnosis Date  . Acid reflux   . Actinic keratosis    Precancerous  . Allergic rhinitis   . Anxiety   . Asthma   . CVA (cerebral infarction)   . Cystocele   . Degenerative disc disease, cervical   . Diastolic dysfunction   . GERD (gastroesophageal reflux disease) PT STATES SEVERE---- CONTROLLED W/ NEXIUM  . History of TIAs AGE 60  AND 2008---  NO RESIDUAL  . Hypertension   . Non-specific gastrointestinal complaint   . OSA (obstructive sleep apnea)    Severe CPAP 12 MM H2O- Dr Radford Pax  . Pulmonary HTN ([place])    Moderate with PASP 62mHg by echo 03/2016  . Restless leg syndrome   . Rosacea   . SUI (stress urinary incontinence, female)     Tobacco Use: History  Smoking Status  . Never Smoker  Smokeless Tobacco  . Never Used    Labs: Recent Review Flowsheet Data    Labs for ITP Cardiac and Pulmonary Rehab Latest Ref Rng & Units 09/14/2007 04/11/2012 04/12/2012 06/26/2016   Cholestrol 0 - 200 mg/dL 171 ATP III CLASSIFICATION: <200     mg/dL   Desirable 200-239  mg/dL   Borderline High >=240    mg/dL   High - 142 -  LDLCALC 0 - 99 mg/dL 100 Total Cholesterol/HDL:CHD Risk Coronary Heart Disease Risk Table Men   Women 1/2 Average Risk   3.4   3.3(H) - 75 -   HDL >39 mg/dL 29(L) - 29(L) -   Trlycerides <150 mg/dL 208(H) - 192(H) -   Hemoglobin A1c <5.7 % 5.4 (NOTE)   The ADA recommends the following therapeutic goals for glycemic   control related to Hgb A1C measurement:   Goal of Therapy:   < 7.0% Hgb A1C   Action Suggested:  > 8.0% Hgb A1C   Ref:  Diabetes Care, 22, Suppl. 1, 1999 - 6.0(H) -   PHART - 7.372 - - -   PCO2ART - 45.7(H) - - -   HCO3 - 26.6(H) - - -   TCO2 0 - 100 mmol/L 28 27 - 25   O2SAT - 96.0 - - -       ADL UCSD:     Pulmonary Assessment Scores    Row Name 06/23/16 1213         ADL UCSD   ADL Phase Entry     SOB Score total 67     Rest 0     Walk  1     Stairs 5     Bath 0     Dress 0     Shop 5        Pulmonary Function Assessment:     Pulmonary Function Assessment - 06/23/16 1211      Pulmonary Function Tests   FVC% 89 %   FEV1% 94 %   FEV1/FVC Ratio 80   DLCO% 64 %     Initial Spirometry Results   Comments Test date: 04/16/16     Breath   Bilateral Breath Sounds Clear   Shortness of Breath Yes      Exercise Target Goals:    Exercise Program Goal: Individual exercise prescription set with THRR, safety & activity barriers. Participant demonstrates ability to understand and report RPE using BORG scale, to self-measure pulse accurately, and to acknowledge the importance of the exercise prescription.  Exercise Prescription Goal: Starting with aerobic activity 30 plus minutes a day, 3 days per week for initial exercise prescription. Provide home exercise prescription and guidelines that participant acknowledges understanding prior to discharge.  Activity Barriers & Risk Stratification:     Activity Barriers & Cardiac Risk Stratification - 06/23/16 1210      Activity Barriers & Cardiac Risk Stratification   Activity Barriers Deconditioning;Shortness of Breath   Cardiac Risk Stratification Moderate      6 Minute Walk:     6 Minute Walk    Row Name 06/23/16 1536         6 Minute Walk   Distance 1352.5 feet     Walk Time 6 minutes     # of Rest Breaks 0     MPH 2.56     METS 3.19     RPE 12     Perceived Dyspnea  3     VO2 Peak 11.17     Symptoms No        Initial Exercise Prescription:     Initial Exercise Prescription - 06/23/16 1500      Date of Initial Exercise RX and Referring Provider   Date 06/23/16   Referring Provider Ramaswamy     Treadmill   MPH 2.5   Grade 0.5   Minutes 15   METs 3.09     NuStep  Level 2   Watts 50   Minutes 15   METs 3     REL-XR   Level 1   Watts 50   Minutes 15   METs 3     Prescription Details   Frequency (times per week) 3   Duration  Progress to 45 minutes of aerobic exercise without signs/symptoms of physical distress     Intensity   THRR 40-80% of Max Heartrate 102-135   Ratings of Perceived Exertion 11-15   Perceived Dyspnea 0-4     Progression   Progression Continue to progress workloads to maintain intensity without signs/symptoms of physical distress.     Resistance Training   Training Prescription Yes   Weight 2   Reps 10-15      Perform Capillary Blood Glucose checks as needed.  Exercise Prescription Changes:     Exercise Prescription Changes    Row Name 07/06/16 1400             Response to Exercise   Blood Pressure (Admit) 160/80       Blood Pressure (Exercise) 166/70       Blood Pressure (Exit) 134/76       Heart Rate (Admit) 72 bpm       Heart Rate (Exercise) 103 bpm       Heart Rate (Exit) 75 bpm       Oxygen Saturation (Admit) 97 %       Oxygen Saturation (Exercise) 94 %       Oxygen Saturation (Exit) 95 %       Rating of Perceived Exertion (Exercise) 13       Perceived Dyspnea (Exercise) 3.5       Duration Progress to 30 minutes of continuous aerobic without signs/symptoms of physical distress       Intensity THRR unchanged  102-135         Progression   Progression Continue progressive overload as per policy without signs/symptoms or physical distress.         Resistance Training   Training Prescription Yes       Weight 2       Reps 10-15         Treadmill   MPH 2       Grade 0       Minutes 15       METs 2.5         NuStep   Level 2       Watts 50       Minutes 15       METs 3         REL-XR   Level 1       Watts 50       Minutes 15       METs 3          Exercise Comments:     Exercise Comments    Row Name 07/06/16 1441 07/15/16 1426 07/29/16 1543       Exercise Comments  First full day of exercise!  Patient was oriented to gym and equipment including functions, settings, policies, and procedures.  Patient's individual exercise prescription and  treatment plan were reviewed.  All starting workloads were established based on the results of the 6 minute walk test done at initial orientation visit.  The plan for exercise progression was also introduced and progression will be customized based on patient's performance and goals. Pt has been out with swollen foot since first visit.  Pt continues to stay out due to plantar facisitis.        Discharge Exercise Prescription (Final Exercise Prescription Changes):     Exercise Prescription Changes - 07/06/16 1400      Response to Exercise   Blood Pressure (Admit) 160/80   Blood Pressure (Exercise) 166/70   Blood Pressure (Exit) 134/76   Heart Rate (Admit) 72 bpm   Heart Rate (Exercise) 103 bpm   Heart Rate (Exit) 75 bpm   Oxygen Saturation (Admit) 97 %   Oxygen Saturation (Exercise) 94 %   Oxygen Saturation (Exit) 95 %   Rating of Perceived Exertion (Exercise) 13   Perceived Dyspnea (Exercise) 3.5   Duration Progress to 30 minutes of continuous aerobic without signs/symptoms of physical distress   Intensity THRR unchanged  102-135     Progression   Progression Continue progressive overload as per policy without signs/symptoms or physical distress.     Resistance Training   Training Prescription Yes   Weight 2   Reps 10-15     Treadmill   MPH 2   Grade 0   Minutes 15   METs 2.5     NuStep   Level 2   Watts 50   Minutes 15   METs 3     REL-XR   Level 1   Watts 50   Minutes 15   METs 3       Nutrition:  Target Goals: Understanding of nutrition guidelines, daily intake of sodium <1532m, cholesterol <2035m calories 30% from fat and 7% or less from saturated fats, daily to have 5 or more servings of fruits and vegetables.  Biometrics:     Pre Biometrics - 06/23/16 1535      Pre Biometrics   Height 5' 2.25" (1.581 Vang)   Weight 190 lb 12.8 oz (86.5 kg)   Waist Circumference 40.25 inches   Hip Circumference 44.5 inches   Waist to Hip Ratio 0.9 %   BMI  (Calculated) 34.7       Nutrition Therapy Plan and Nutrition Goals:     Nutrition Therapy & Goals - 06/23/16 1218      Nutrition Therapy   Diet Amy MuErnestina Columbiaould like to meet with the dietitian. Her portion sizes are moderate amounts; moderate amount of salt; very few sweets; and she drinks 40+ ounces of water.      Nutrition Discharge: Rate Your Plate Scores:   Psychosocial: Target Goals: Acknowledge presence or absence of depression, maximize coping skills, provide positive support system. Participant is able to verbalize types and ability to use techniques and skills needed for reducing stress and depression.  Initial Review & Psychosocial Screening:     Initial Psych Review & Screening - 06/23/16 1228      Family Dynamics   Good Support System? Yes   Comments Amy Vang have good family support from her 2 children and husband. She does have trouble sleeping, although she is very compliant with her CPAP. She  has some anxiousness about her PHA diagnosis and is interested in learning more about this diagnosis.  iagnosis and lac     Barriers   Psychosocial barriers to participate in program The patient should benefit from training in stress management and relaxation.     Screening Interventions   Interventions Encouraged to exercise;Program counselor consult      Quality of Life Scores:     Quality of Life - 06/23/16 1232      Quality of Life Scores  Health/Function Pre 21 %   Socioeconomic Pre 20.88 %   Psych/Spiritual Pre 21 %   Family Pre 21 %   GLOBAL Pre 20.81 %      PHQ-9: Recent Review Flowsheet Data    Depression screen [facility] 2/9 06/23/2016   Decreased Interest 1   Down, Depressed, Hopeless 1   PHQ - 2 Score 2   Altered sleeping 3   Tired, decreased energy 2   Change in appetite 0   Feeling bad or failure about yourself  0   Trouble concentrating 0   Moving slowly or fidgety/restless 0   Suicidal thoughts 0   PHQ-9 Score 7   Difficult doing  work/chores Somewhat difficult      Psychosocial Evaluation and Intervention:     Psychosocial Evaluation - 07/06/16 1021      Psychosocial Evaluation & Interventions   Comments Counselor met with Amy Vang (Amy Vang) today for initial psychosocial evaluation.  She is a 70 year old female who has some pulmonary and possible cardiology issues and is seeing a Dr. to confirm her Dx later this month.   She has a strong support system with a spouse of 27 years and a son and daughter who live close by as well as active involvement in her local church community.  Amy Vang states she had a TIA (1) week ago and has been released to attend this program by her Dr.  She also has sleep apnea and typically sleeps 6-7 hours per night with the help of medications prescribed, and her CPAP machine.  Amy Vang reports having a history of depression/anxiety and is on medication for this currently that is managing the symptoms adequately.  She states she is typically in a positive mood and has minimal stress in her life other than some concern for her health and her spouse.  Amy Vang has goals to improve her breathing; lose weight and decrease the number of medications she is taking while in this program.  Counselor will be following with Amy. Vang throughout the course of Tx.     Continued Psychosocial Services Needed Yes  Amy. Vang will benefit from the psychoeducational components of this program, as well as meeting with the dietician to address her weight loss goals.        Psychosocial Re-Evaluation:  Education: Education Goals: Education classes will be provided on a weekly basis, covering required topics. Participant will state understanding/return demonstration of topics presented.  Learning Barriers/Preferences:     Learning Barriers/Preferences - 06/23/16 1211      Learning Barriers/Preferences   Learning Barriers None   Learning Preferences Group Instruction;Individual Instruction;Pictoral;Skilled  Demonstration;Verbal Instruction;Video;Written Material      Education Topics: Initial Evaluation Education: - Verbal, written and demonstration of respiratory meds, RPE/PD scales, oximetry and breathing techniques. Instruction on use of nebulizers and MDIs: cleaning and proper use, rinsing mouth with steroid doses and importance of monitoring MDI activations. Flowsheet Row Pulmonary Rehab from 07/06/2016 in [facility] Cardiac and Pulmonary Rehab  Date  06/23/16  Educator  LB  Instruction Review Code  2- meets goals/outcomes      General Nutrition Guidelines/Fats and Fiber: -Group instruction provided by verbal, written material, models and posters to present the general guidelines for heart healthy nutrition. Gives an explanation and review of dietary fats and fiber.   Controlling Sodium/Reading Food Labels: -Group verbal and written material supporting the discussion of sodium use in heart healthy nutrition. Review and explanation with models, verbal and written materials  for utilization of the food label.   Exercise Physiology & Risk Factors: - Group verbal and written instruction with models to review the exercise physiology of the cardiovascular system and associated critical values. Details cardiovascular disease risk factors and the goals associated with each risk factor.   Aerobic Exercise & Resistance Training: - Gives group verbal and written discussion on the health impact of inactivity. On the components of aerobic and resistive training programs and the benefits of this training and how to safely progress through these programs.   Flexibility, Balance, General Exercise Guidelines: - Provides group verbal and written instruction on the benefits of flexibility and balance training programs. Provides general exercise guidelines with specific guidelines to those with heart or lung disease. Demonstration and skill practice provided.   Stress Management: - Provides group verbal and  written instruction about the health risks of elevated stress, cause of high stress, and healthy ways to reduce stress.   Depression: - Provides group verbal and written instruction on the correlation between heart/lung disease and depressed mood, treatment options, and the stigmas associated with seeking treatment.   Exercise & Equipment Safety: - Individual verbal instruction and demonstration of equipment use and safety with use of the equipment. Flowsheet Row Pulmonary Rehab from 07/06/2016 in [facility] Cardiac and Pulmonary Rehab  Date  07/06/16  Educator  [facility]  Instruction Review Code  2- meets goals/outcomes      Infection Prevention: - Provides verbal and written material to individual with discussion of infection control including proper hand washing and proper equipment cleaning during exercise session. Flowsheet Row Pulmonary Rehab from 07/06/2016 in [facility] Cardiac and Pulmonary Rehab  Date  07/06/16  Educator  [facility]  Instruction Review Code  2- meets goals/outcomes      Falls Prevention: - Provides verbal and written material to individual with discussion of falls prevention and safety. Flowsheet Row Pulmonary Rehab from 07/06/2016 in [facility] Cardiac and Pulmonary Rehab  Date  06/23/16  Educator  LB  Instruction Review Code  2- meets goals/outcomes      Diabetes: - Individual verbal and written instruction to review signs/symptoms of diabetes, desired ranges of glucose level fasting, after meals and with exercise. Advice that pre and post exercise glucose checks will be done for 3 sessions at entry of program.   Chronic Lung Diseases: - Group verbal and written instruction to review new updates, new respiratory medications, new advancements in procedures and treatments. Provide informative websites and "800" numbers of self-education.   Lung Procedures: - Group verbal and written instruction to describe testing methods done to diagnose lung disease. Review the outcome of test  results. Describe the treatment choices: Pulmonary Function Tests, ABGs and oximetry.   Energy Conservation: - Provide group verbal and written instruction for methods to conserve energy, plan and organize activities. Instruct on pacing techniques, use of adaptive equipment and posture/positioning to relieve shortness of breath.   Triggers: - Group verbal and written instruction to review types of environmental controls: home humidity, furnaces, filters, dust mite/pet prevention, HEPA vacuums. To discuss weather changes, air quality and the benefits of nasal washing.   Exacerbations: - Group verbal and written instruction to provide: warning signs, infection symptoms, calling MD promptly, preventive modes, and value of vaccinations. Review: effective airway clearance, coughing and/or vibration techniques. Create an Sports administrator.   Oxygen: - Individual and group verbal and written instruction on oxygen therapy. Includes supplement oxygen, available portable oxygen systems, continuous and intermittent flow rates, oxygen safety, concentrators, and Medicare  reimbursement for oxygen.   Respiratory Medications: - Group verbal and written instruction to review medications for lung disease. Drug class, frequency, complications, importance of spacers, rinsing mouth after steroid MDI's, and proper cleaning methods for nebulizers. Flowsheet Row Pulmonary Rehab from 07/06/2016 in [facility] Cardiac and Pulmonary Rehab  Date  06/23/16  Educator  LB  Instruction Review Code  2- meets goals/outcomes      AED/CPR: - Group verbal and written instruction with the use of models to demonstrate the basic use of the AED with the basic ABC's of resuscitation.   Breathing Retraining: - Provides individuals verbal and written instruction on purpose, frequency, and proper technique of diaphragmatic breathing and pursed-lipped breathing. Applies individual practice skills. Flowsheet Row Pulmonary Rehab from 07/06/2016  in [facility] Cardiac and Pulmonary Rehab  Date  07/06/16  Educator  [facility]  Instruction Review Code  2- meets goals/outcomes      Anatomy and Physiology of the Lungs: - Group verbal and written instruction with the use of models to provide basic lung anatomy and physiology related to function, structure and complications of lung disease.   Heart Failure: - Group verbal and written instruction on the basics of heart failure: signs/symptoms, treatments, explanation of ejection fraction, enlarged heart and cardiomyopathy.   Sleep Apnea: - Individual verbal and written instruction to review Obstructive Sleep Apnea. Review of risk factors, methods for diagnosing and types of masks and machines for OSA. Flowsheet Row Pulmonary Rehab from 07/06/2016 in [facility] Cardiac and Pulmonary Rehab  Date  06/23/16  Educator  LB  Instruction Review Code  2- meets goals/outcomes      Anxiety: - Provides group, verbal and written instruction on the correlation between heart/lung disease and anxiety, treatment options, and management of anxiety.   Relaxation: - Provides group, verbal and written instruction about the benefits of relaxation for patients with heart/lung disease. Also provides patients with examples of relaxation techniques.   Knowledge Questionnaire Score:     Knowledge Questionnaire Score - 06/23/16 1211      Knowledge Questionnaire Score   Pre Score 8/10       Core Components/Risk Factors/Patient Goals at Admission:     Personal Goals and Risk Factors at Admission - 06/23/16 1223      Core Components/Risk Factors/Patient Goals on Admission    Weight Management Yes;Weight Loss  Amy Vang would like to meet with the dietitian. Her portion sizes are moderate amounts; moderate amount of salt; very few sweets; and she drinks 40+ ounces of water   Intervention Weight Management: Develop a combined nutrition and exercise program designed to reach desired caloric intake, while maintaining  appropriate intake of nutrient and fiber, sodium and fats, and appropriate energy expenditure required for the weight goal.;Weight Management: Provide education and appropriate resources to help participant work on and attain dietary goals.;Weight Management/Obesity: Establish reasonable short term and long term weight goals.;Obesity: Provide education and appropriate resources to help participant work on and attain dietary goals.   Admit Weight 190 lb 12.8 oz (86.5 kg)   Goal Weight: Short Term 170 lb (77.1 kg)   Goal Weight: Long Term 160 lb (72.6 kg)   Expected Outcomes Short Term: Continue to assess and modify interventions until short term weight is achieved;Long Term: Adherence to nutrition and physical activity/exercise program aimed toward attainment of established weight goal;Weight Loss: Understanding of general recommendations for a balanced deficit meal plan, which promotes 1-2 lb weight loss per week and includes a negative energy balance of [phone] kcal/d;Understanding  recommendations for meals to include 15-35% energy as protein, 25-35% energy from fat, 35-60% energy from carbohydrates, less than 269m of dietary cholesterol, 20-35 gm of total fiber daily;Understanding of distribution of calorie intake throughout the day with the consumption of 4-5 meals/snacks   Sedentary Yes  Member of [employer]   Intervention Provide advice, education, support and counseling about physical activity/exercise needs.;Develop an individualized exercise prescription for aerobic and resistive training based on initial evaluation findings, risk stratification, comorbidities and participant's personal goals.   Expected Outcomes Achievement of increased cardiorespiratory fitness and enhanced flexibility, muscular endurance and strength shown through measurements of functional capacity and personal statement of participant.   Increase Strength and Stamina Yes   Intervention Provide advice, education, support  and counseling about physical activity/exercise needs.;Develop an individualized exercise prescription for aerobic and resistive training based on initial evaluation findings, risk stratification, comorbidities and participant's personal goals.   Expected Outcomes Achievement of increased cardiorespiratory fitness and enhanced flexibility, muscular endurance and strength shown through measurements of functional capacity and personal statement of participant.   Improve shortness of breath with ADL's Yes   Intervention Provide education, individualized exercise plan and daily activity instruction to help decrease symptoms of SOB with activities of daily living.   Expected Outcomes Short Term: Achieves a reduction of symptoms when performing activities of daily living.   Develop more efficient breathing techniques such as purse lipped breathing and diaphragmatic breathing; and practicing self-pacing with activity Yes   Intervention Provide education, demonstration and support about specific breathing techniuqes utilized for more efficient breathing. Include techniques such as pursed lipped breathing, diaphragmatic breathing and self-pacing activity.   Expected Outcomes Short Term: Participant will be able to demonstrate and use breathing techniques as needed throughout daily activities.   Increase knowledge of respiratory medications and ability to use respiratory devices properly  Yes  Uses Albuterol MDI rarely, although carries in her purse. CPAP at 12cmH2O pressure, nasal mask, very compliant.   Intervention Provide education and demonstration as needed of appropriate use of medications, inhalers, and oxygen therapy.   Expected Outcomes Short Term: Achieves understanding of medications use. Understands that oxygen is a medication prescribed by physician. Demonstrates appropriate use of inhaler and oxygen therapy.   Hypertension Yes   Intervention Provide education on lifestyle modifcations including  regular physical activity/exercise, weight management, moderate sodium restriction and increased consumption of fresh fruit, vegetables, and low fat dairy, alcohol moderation, and smoking cessation.;Monitor prescription use compliance.   Expected Outcomes Short Term: Continued assessment and intervention until BP is < 140/925mHG in hypertensive participants. < 130/8028mG in hypertensive participants with diabetes, heart failure or chronic kidney disease.;Long Term: Maintenance of blood pressure at goal levels.   Lipids Yes   Intervention Provide education and support for participant on nutrition & aerobic/resistive exercise along with prescribed medications to achieve LDL <86m37mDL >40mg49mExpected Outcomes Short Term: Participant states understanding of desired cholesterol values and is compliant with medications prescribed. Participant is following exercise prescription and nutrition guidelines.;Long Term: Cholesterol controlled with medications as prescribed, with individualized exercise RX and with personalized nutrition plan. Value goals: LDL < 86mg,29m > 40 mg.      Core Components/Risk Factors/Patient Goals Review:      Goals and Risk Factor Review    Row Name 07/06/16 1430             Core Components/Risk Factors/Patient Goals Review   Personal Goals Review Develop more efficient breathing techniques such as  purse lipped breathing and diaphragmatic breathing and practicing self-pacing with activity.       Review PLB was discussed with patient as to how it can help control SOB during exercise and daily activities. The patient demonstrated understanding of this technique.        Expected Outcomes The patient will continue to use PLB during exercise class as well as during daily activites to better control SOB and thus be able to increase strength and stamina.           Core Components/Risk Factors/Patient Goals at Discharge (Final Review):      Goals and Risk Factor Review -  07/06/16 1430      Core Components/Risk Factors/Patient Goals Review   Personal Goals Review Develop more efficient breathing techniques such as purse lipped breathing and diaphragmatic breathing and practicing self-pacing with activity.   Review PLB was discussed with patient as to how it can help control SOB during exercise and daily activities. The patient demonstrated understanding of this technique.    Expected Outcomes The patient will continue to use PLB during exercise class as well as during daily activites to better control SOB and thus be able to increase strength and stamina.       ITP Comments:     ITP Comments    Row Name 07/06/16 1441 07/08/16 0915 07/20/16 1515       ITP Comments The patient did experience chest pain of 6.5-7 out of 10 while walking on the TM. When workloads were decreased the pain improved, and when the TM was stopped the pain was completely resolved. She did not exerperience these symptoms on the other machines. TM walking speed will be decreased to below her pain threshold. She is in close contact with her doctor about these symptoms and has an appointment with a cardiologist this month.  Amy Vang called to inform us that her right foot did swell after class. Her physician made an appointment for her, received a cortizone shot, and needs to rest her foot 8-10days. Amy Vang was cleared by Dr Chase Caller to return to [place], but has now developed some plantar fasciitis and will miss this week of exercise.        Comments: 30 Day Review

## 2016-08-10 ENCOUNTER — Ambulatory Visit: Payer: BC Managed Care – PPO | Admitting: Podiatry

## 2016-08-11 ENCOUNTER — Encounter: Payer: Self-pay | Admitting: *Deleted

## 2016-08-11 DIAGNOSIS — G4730 Sleep apnea, unspecified: Secondary | ICD-10-CM

## 2016-08-11 DIAGNOSIS — I2721 Secondary pulmonary arterial hypertension: Secondary | ICD-10-CM

## 2016-08-12 ENCOUNTER — Telehealth (HOSPITAL_COMMUNITY): Payer: Self-pay

## 2016-08-12 ENCOUNTER — Encounter: Payer: Self-pay | Admitting: Podiatry

## 2016-08-12 ENCOUNTER — Ambulatory Visit (INDEPENDENT_AMBULATORY_CARE_PROVIDER_SITE_OTHER): Payer: Medicare PPO | Admitting: Podiatry

## 2016-08-12 ENCOUNTER — Ambulatory Visit: Payer: BC Managed Care – PPO | Admitting: Podiatry

## 2016-08-12 DIAGNOSIS — M7751 Other enthesopathy of right foot: Secondary | ICD-10-CM

## 2016-08-12 NOTE — Telephone Encounter (Signed)
Patient called to clarify arrival time for tomorrow's cath at [facility] with Dr. Aundra Dubin. Arrival time and other pre procedural instructions (meds, NPO) reviewed with patient.  Renee Pain, RN

## 2016-08-12 NOTE — Progress Notes (Signed)
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 She states that she is doing much better over the past month. She states that his proximal 80% resolved. Mildly tender overlying the anterior right ankle where the injection.  Objective: Vital signs are stable oriented 3 arthritic profile was elevated only for CRP which was mildly elevated. She had a 6.6. Acid level at a 7.0. She probably coming down from an elevated uric acid. She has tenderness on palpation of the anterolateral ankle small bruise from injection site.  Assessment: Resolving capsulitis right ankle.  Plan: Follow up with her on an as-needed basis. Should this regress she will notify me immediately.

## 2016-08-13 ENCOUNTER — Encounter (HOSPITAL_COMMUNITY): Payer: Self-pay | Admitting: Cardiology

## 2016-08-13 ENCOUNTER — Encounter (HOSPITAL_COMMUNITY)
Admission: RE | Disposition: A | Discharge status: Home / Self Care | Payer: Self-pay | Source: Ambulatory Visit | Attending: Cardiology

## 2016-08-13 ENCOUNTER — Ambulatory Visit (HOSPITAL_COMMUNITY)
Admission: RE | Admit: 2016-08-13 | Discharge: 2016-08-13 | Disposition: A | Discharge status: Home / Self Care | Payer: Medicare PPO | Source: Ambulatory Visit | Attending: Cardiology | Admitting: Cardiology

## 2016-08-13 ENCOUNTER — Other Ambulatory Visit: Payer: Self-pay

## 2016-08-13 DIAGNOSIS — I10 Essential (primary) hypertension: Secondary | ICD-10-CM | POA: Diagnosis not present

## 2016-08-13 DIAGNOSIS — Z8249 Family history of ischemic heart disease and other diseases of the circulatory system: Secondary | ICD-10-CM | POA: Diagnosis not present

## 2016-08-13 DIAGNOSIS — G4733 Obstructive sleep apnea (adult) (pediatric): Secondary | ICD-10-CM | POA: Insufficient documentation

## 2016-08-13 DIAGNOSIS — R0600 Dyspnea, unspecified: Secondary | ICD-10-CM | POA: Insufficient documentation

## 2016-08-13 DIAGNOSIS — K219 Gastro-esophageal reflux disease without esophagitis: Secondary | ICD-10-CM | POA: Diagnosis not present

## 2016-08-13 DIAGNOSIS — R0789 Other chest pain: Secondary | ICD-10-CM | POA: Diagnosis not present

## 2016-08-13 DIAGNOSIS — Z7982 Long term (current) use of aspirin: Secondary | ICD-10-CM | POA: Insufficient documentation

## 2016-08-13 DIAGNOSIS — I270 Primary pulmonary hypertension: Secondary | ICD-10-CM | POA: Diagnosis not present

## 2016-08-13 DIAGNOSIS — J45909 Unspecified asthma, uncomplicated: Secondary | ICD-10-CM | POA: Diagnosis not present

## 2016-08-13 DIAGNOSIS — I272 Pulmonary hypertension, unspecified: Secondary | ICD-10-CM

## 2016-08-13 HISTORY — PX: CARDIAC CATHETERIZATION: SHX172

## 2016-08-13 LAB — CBC
HCT: 37.5 % (ref 36.0–46.0)
Hemoglobin: 12 g/dL (ref 12.0–15.0)
MCH: 29.1 pg (ref 26.0–34.0)
MCHC: 32 g/dL (ref 30.0–36.0)
MCV: 91 fL (ref 78.0–100.0)
PLATELETS: 179 10*3/uL (ref 150–400)
RBC: 4.12 MIL/uL (ref 3.87–5.11)
RDW: 13.3 % (ref 11.5–15.5)
WBC: 5 10*3/uL (ref 4.0–10.5)

## 2016-08-13 LAB — BASIC METABOLIC PANEL
Anion gap: 7 (ref 5–15)
BUN: 9 mg/dL (ref 6–20)
CHLORIDE: 104 mmol/L (ref 101–111)
CO2: 30 mmol/L (ref 22–32)
CREATININE: 0.66 mg/dL (ref 0.44–1.00)
Calcium: 8.7 mg/dL — ABNORMAL LOW (ref 8.9–10.3)
GFR calc Af Amer: >60 mL/min (ref >60)
GFR calc non Af Amer: >60 mL/min (ref >60)
Glucose, Bld: 109 mg/dL — ABNORMAL HIGH (ref 65–99)
Potassium: 3.3 mmol/L — ABNORMAL LOW (ref 3.5–5.1)
SODIUM: 141 mmol/L (ref 135–145)

## 2016-08-13 LAB — POCT I-STAT 3, VENOUS BLOOD GAS (G3P V)
ACID-BASE EXCESS: 4 mmol/L — AB (ref 0.0–2.0)
Acid-Base Excess: 3 mmol/L — ABNORMAL HIGH (ref 0.0–2.0)
Bicarbonate: 28.5 mmol/L — ABNORMAL HIGH (ref 20.0–28.0)
Bicarbonate: 30.6 mmol/L — ABNORMAL HIGH (ref 20.0–28.0)
O2 Saturation: 63 %
O2 Saturation: 64 %
PCO2 VEN: 48.1 mmHg (ref 44.0–60.0)
PCO2 VEN: 52 mmHg (ref 44.0–60.0)
PH VEN: 7.378 (ref 7.250–7.430)
PH VEN: 7.381 (ref 7.250–7.430)
PO2 VEN: 34 mmHg (ref 32.0–45.0)
TCO2: 30 mmol/L (ref 0–100)
TCO2: 32 mmol/L (ref 0–100)
pO2, Ven: 34 mmHg (ref 32.0–45.0)

## 2016-08-13 LAB — PROTIME-INR
INR: 0.95
Prothrombin Time: 12.6 seconds (ref 11.4–15.2)

## 2016-08-13 SURGERY — RIGHT HEART CATH
Anesthesia: LOCAL

## 2016-08-13 MED ORDER — POTASSIUM CHLORIDE CRYS ER 20 MEQ PO TBCR
20.0000 meq | EXTENDED_RELEASE_TABLET | Freq: Once | ORAL | Status: AC
  Administered 2016-08-13: 20 meq via ORAL

## 2016-08-13 MED ORDER — ONDANSETRON HCL 4 MG/2ML IJ SOLN: 4.0000 mg | Freq: Four times a day (QID) | INTRAMUSCULAR | Status: DC | PRN

## 2016-08-13 MED ORDER — LIDOCAINE HCL (PF) 1 % IJ SOLN
INTRAMUSCULAR | Status: AC
  Filled 2016-08-13: qty 30

## 2016-08-13 MED ORDER — SODIUM CHLORIDE 0.9 % IV SOLN
INTRAVENOUS | Status: DC
  Administered 2016-08-13: 08:00:00 via INTRAVENOUS

## 2016-08-13 MED ORDER — MIDAZOLAM HCL 2 MG/2ML IJ SOLN
INTRAMUSCULAR | Status: AC
  Filled 2016-08-13: qty 2

## 2016-08-13 MED ORDER — MIDAZOLAM HCL 2 MG/2ML IJ SOLN
INTRAMUSCULAR | Status: DC | PRN
  Administered 2016-08-13: 0.5 mg via INTRAVENOUS

## 2016-08-13 MED ORDER — LIDOCAINE HCL (PF) 1 % IJ SOLN
INTRAMUSCULAR | Status: DC | PRN
  Administered 2016-08-13: 2 mL

## 2016-08-13 MED ORDER — SODIUM CHLORIDE 0.9% FLUSH: 3.0000 mL | Freq: Two times a day (BID) | INTRAVENOUS | Status: DC

## 2016-08-13 MED ORDER — SODIUM CHLORIDE 0.9% FLUSH: 3.0000 mL | INTRAVENOUS | Status: DC | PRN

## 2016-08-13 MED ORDER — SODIUM CHLORIDE 0.9 % IV SOLN: 250.0000 mL | INTRAVENOUS | Status: DC | PRN

## 2016-08-13 MED ORDER — POTASSIUM CHLORIDE CRYS ER 20 MEQ PO TBCR
EXTENDED_RELEASE_TABLET | ORAL | Status: AC
  Filled 2016-08-13: qty 1

## 2016-08-13 MED ORDER — FENTANYL CITRATE (PF) 100 MCG/2ML IJ SOLN
INTRAMUSCULAR | Status: AC
  Filled 2016-08-13: qty 2

## 2016-08-13 MED ORDER — HEPARIN (PORCINE) IN NACL 2-0.9 UNIT/ML-% IJ SOLN
INTRAMUSCULAR | Status: DC | PRN
  Administered 2016-08-13: 500 mL

## 2016-08-13 MED ORDER — FENTANYL CITRATE (PF) 100 MCG/2ML IJ SOLN
INTRAMUSCULAR | Status: DC | PRN
  Administered 2016-08-13: 25 ug via INTRAVENOUS

## 2016-08-13 MED ORDER — HEPARIN (PORCINE) IN NACL 2-0.9 UNIT/ML-% IJ SOLN
INTRAMUSCULAR | Status: AC
  Filled 2016-08-13: qty 500

## 2016-08-13 MED ORDER — ACETAMINOPHEN 325 MG PO TABS: 650.0000 mg | ORAL_TABLET | ORAL | Status: DC | PRN

## 2016-08-13 SURGICAL SUPPLY — 9 items
CATH BALLN WEDGE 5F 110CM (CATHETERS) ×1 IMPLANT
GUIDEWIRE .025 260CM (WIRE) ×1 IMPLANT
PACK CARDIAC CATHETERIZATION (CUSTOM PROCEDURE TRAY) ×2 IMPLANT
PROTECTION STATION PRESSURIZED (MISCELLANEOUS) ×2
SHEATH FAST CATH BRACH 5F 5CM (SHEATH) ×1 IMPLANT
STATION PROTECTION PRESSURIZED (MISCELLANEOUS) IMPLANT
TRANSDUCER W/STOPCOCK (MISCELLANEOUS) ×3 IMPLANT
TUBING ART PRESS 72  MALE/FEM (TUBING) ×1
TUBING ART PRESS 72 MALE/FEM (TUBING) IMPLANT

## 2016-08-13 NOTE — H&P (View-Only) (Signed)
PCP: Dr. Carol Ada Cardiology: Dr. Radford Pax Pulmonology: Dr. Chase Caller HF Cardiology: Dr. Aundra Dubin  70 yo with history of OSA, asthma, HTN, and several months of unexplained exertional dyspnea presents for evaluation of pulmonary hypertension.  She says that for 6-9 months, she has had dyspnea walking to mailbox and walking up steps.  Also with moderate housework like making up beds.  Dyspnea bending over.  Some days are better than others.  She also gets tightness in her chest from time to time, not always with exertion.  She always uses her CPAP.  She has been diagnosed with asthma in the past but says that inhalers have not helped her symptoms. She also has GERD but takes Nexium twice a day for control.    She has had extensive workup for her dyspnea.  Echo showed elevated PA pressure at 46 mmHg with normal LV and RV systolic function.  CTA chest showed no PE or interstitial lung disease.  PFTs were normal except for decreased DLCO.  Cardiolite showed no ischemia or infarction.  CPX showed near-normal functional capacity, primarily limited by body habitus.  She was sent to me for consideration of right heart heart cath.   ECG (7/17): NSR, t wave flattening anterolaterally  Labs (5/17): BP 26 Labs (7/17): creatinine 0.66  PMH: 1. GERD 2. OSA: Uses CPAP. 3. Asthma 4. HTN 5. 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 6. Chronic diyspnea: For about a year. - Echo (5/17) with EF [id number], grade I diastolic dysfunction, moderate TR, RV normal size/systolic function, PASP 46 mmHg. - CTA chest (5/17) with no PE, normal lung parenchyma (no ILD).  - Lexiscan Cardiolite (6/17) with EF 72%, no ischemia/infarction.  - CPX (7/17) with RER 1.17, peak VO2 15.9, VE/VCO2 slope 31.5 => normal functional capacity, limited mostly by body habitus. - PFTs (5/17) with normal spirometry but mildly decreased DLCO.   SH: Married, mother of Earnstine Regal, nonsmoker, no ETOH.   FH: Grandfather with MI, brother with MI  in his 35s. Mother with atrial fibrillation.   ROS: All systems reviewed and negative except as per HPI.   Current Outpatient Prescriptions  Medication Sig Dispense Refill  . aspirin EC 81 MG tablet Take 81 mg by mouth daily.    . Calcium Carbonate-Vitamin D (CALCIUM PLUS VITAMIN D PO) Take 1 tablet by mouth daily.    . citalopram (CELEXA) 20 MG tablet 10 mg oral daily  2  . clonazePAM (KLONOPIN) 1 MG tablet Take 0.5 mg by mouth at bedtime.     Marland Kitchen esomeprazole (NEXIUM) 40 MG capsule Take 40 mg by mouth 2 (two) times daily.    . fexofenadine (ALLEGRA) 60 MG tablet Take 60 mg by mouth daily.    . hydrochlorothiazide (HYDRODIURIL) 25 MG tablet Take 25 mg by mouth daily.    . montelukast (SINGULAIR) 10 MG tablet Take 10 mg by mouth at bedtime.    . polyethylene glycol (MIRALAX / GLYCOLAX) packet Take 17 g by mouth daily as needed for mild constipation.     . potassium chloride (MICRO-K) 10 MEQ CR capsule Take 10 mEq by mouth daily.    . ranitidine (ZANTAC) 300 MG tablet TAKE 1 TABLET DAILY AS DIRECTED FOR REFLUX (Patient taking differently: take 300 mg oral at bedtime) 90 tablet 0  . rOPINIRole (REQUIP) 2 MG tablet Take 3 mg by mouth at bedtime.     Marland Kitchen zolpidem (AMBIEN) 5 MG tablet Take 5 mg by mouth at bedtime.  Current Facility-Administered Medications  Medication Dose Route Frequency Provider Last Rate Last Dose  . triamcinolone acetonide (KENALOG) 10 MG/ML injection 10 mg  10 mg Other Once Trula Slade, DPM       BP (!) 156/72 (BP Location: Right Arm, Patient Position: Sitting, Cuff Size: Normal)   Pulse 67   Wt 193 lb 4 oz (87.7 kg)   SpO2 95%   BMI 35.06 kg/m  General: NAD Neck: No JVD, no thyromegaly or thyroid nodule.  Lungs: Clear to auscultation bilaterally with normal respiratory effort. CV: Nondisplaced PMI.  Heart regular S1/S2, no S3/S4, no murmur.  No peripheral edema.  No carotid bruit.  Normal pedal pulses.  Abdomen: Soft, nontender, no hepatosplenomegaly, no  distention.  Skin: Intact without lesions or rashes.  Neurologic: Alert and oriented x 3.  Psych: Normal affect. Extremities: No clubbing or cyanosis.  HEENT: Normal.   Assessment/Plan: 1. Chronic exertional dyspnea/atypical chest tightness: She has dyspnea of uncertain etiology for at least 6 months.  She has had an extensive workup as detailed above.  No evidence by Cardiolite for obstructive coronary disease, no evidence for PE or interstitial lung disease, normal LV and RV systolic function on echo and normal BNP in past, CPX showed near normal functional capacity, primarily limited by body habitus.  However, PA pressure was elevated by echo at 46 mmHg and DLCO was low on PFTs, suggesting possibility of pulmonary vascular disease/pulmonary hypertension.  Finally, it is possible that symptoms are due to asthma and possibly a component of deconditioning.  Inhalers provided by her allergist have not been particularly helpful.  She has GERD that has been treated, but has not affected her breathing to treat GERD.  - I do think that her symptoms and echo/PFTs warrant an evaluation for pulmonary artery hypertension.  I am going to arrange for RHC.  We discussed the risk/benefits of test today and she agrees to proceed.  2. ?TIAs: Patient have had several episodes of weakness, sometimes bilateral.  There has been concern for possible TIAs but neurology saw the last time she was in the ER and seems to think that TIA diagnosis is somewhat equivocal.  MRI head negative.  Would consider workup for paroxysmal atrial fibrillation with event monitor in future.  She is on ASA 81 daily.  3. HTN: Controlled on HCTZ.   Loralie Champagne 07/30/2016 9:34 AM]

## 2016-08-13 NOTE — Discharge Instructions (Signed)
Angiogram, Care After °Refer to this sheet in the next few weeks. These instructions provide you with information about caring for yourself after your procedure. Your health care provider may also give you more specific instructions. Your treatment has been planned according to current medical practices, but problems sometimes occur. Call your health care provider if you have any problems or questions after your procedure. °WHAT TO EXPECT AFTER THE PROCEDURE °After your procedure, it is typical to have the following: °· Bruising at the catheter insertion site that usually fades within 1-2 weeks. °· Blood collecting in the tissue (hematoma) that may be painful to the touch. It should usually decrease in size and tenderness within 1-2 weeks. °HOME CARE INSTRUCTIONS °· Take medicines only as directed by your health care provider. °· You may shower 24-48 hours after the procedure or as directed by your health care provider. Remove the bandage (dressing) and gently wash the site with plain soap and water. Pat the area dry with a clean towel. Do not rub the site, because this may cause bleeding. °· Do not take baths, swim, or use a hot tub until your health care provider approves. °· Check your insertion site every day for redness, swelling, or drainage. °· Do not apply powder or lotion to the site. °· Do not lift over 10 lb (4.5 kg) for 5 days after your procedure or as directed by your health care provider. °· Ask your health care provider when it is okay to: °¨ Return to work or school. °¨ Resume usual physical activities or sports. °¨ Resume sexual activity. °· Do not drive home if you are discharged the same day as the procedure. Have someone else drive you. °· You may drive 24 hours after the procedure unless otherwise instructed by your health care provider. °· Do not operate machinery or power tools for 24 hours after the procedure or as directed by your health care provider. °· If your procedure was done as an  outpatient procedure, which means that you went home the same day as your procedure, a responsible adult should be with you for the first 24 hours after you arrive home. °· Keep all follow-up visits as directed by your health care provider. This is important. °SEEK MEDICAL CARE IF: °· You have a fever. °· You have chills. °· You have increased bleeding from the catheter insertion site. Hold pressure on the site. °SEEK IMMEDIATE MEDICAL CARE IF: °· You have unusual pain at the catheter insertion site. °· You have redness, warmth, or swelling at the catheter insertion site. °· You have drainage (other than a small amount of blood on the dressing) from the catheter insertion site. °· The catheter insertion site is bleeding, and the bleeding does not stop after 30 minutes of holding steady pressure on the site. °· The area near or just beyond the catheter insertion site becomes pale, cool, tingly, or numb. °  °This information is not intended to replace advice given to you by your health care provider. Make sure you discuss any questions you have with your health care provider. °  °Document Released: 06/04/2005 Document Revised: 12/07/2014 Document Reviewed: 04/19/2013 °Elsevier Interactive Patient Education ©2016 [employer] ° °

## 2016-08-13 NOTE — Interval H&P Note (Signed)
History and Physical Interval Note:  08/13/2016 9:14 AM  Amy Vang  has presented today for surgery, with the diagnosis of pulm htn  The various methods of treatment have been discussed with the patient and family. After consideration of risks, benefits and other options for treatment, the patient has consented to  Procedure(s): Right Heart Cath (N/A) as a surgical intervention .  The patient's history has been reviewed, patient examined, no change in status, stable for surgery.  I have reviewed the patient's chart and labs.  Questions were answered to the patient's satisfaction.     Mirel Hundal [employer]

## 2016-08-14 ENCOUNTER — Telehealth (HOSPITAL_COMMUNITY): Payer: Self-pay | Admitting: Vascular Surgery

## 2016-08-14 NOTE — Telephone Encounter (Signed)
Pt called to canceled post cath appt per [facility]

## 2016-08-18 ENCOUNTER — Encounter: Payer: Self-pay | Admitting: *Deleted

## 2016-08-18 ENCOUNTER — Ambulatory Visit: Payer: BC Managed Care – PPO | Admitting: Podiatry

## 2016-08-18 ENCOUNTER — Telehealth: Payer: Self-pay | Admitting: *Deleted

## 2016-08-18 ENCOUNTER — Telehealth: Payer: Self-pay | Admitting: Cardiology

## 2016-08-18 DIAGNOSIS — I2721 Secondary pulmonary arterial hypertension: Secondary | ICD-10-CM

## 2016-08-18 DIAGNOSIS — G4730 Sleep apnea, unspecified: Secondary | ICD-10-CM

## 2016-08-18 NOTE — Telephone Encounter (Signed)
Pt would like to come to our [place] office and start seeing Dr Rockey Situ Please advise.

## 2016-08-18 NOTE — Telephone Encounter (Signed)
Left pt's family member Jeani Hawking a message to call back.

## 2016-08-18 NOTE — Telephone Encounter (Signed)
Called to check on status of return to rehab.  Feeling like she is ready to come back from the plantar fascitis stand point.  She is going to beach for week.  Her husband needs back surgery.  She hopes to the second week of October. Alberteen Sam, MA, ACSM RCEP 08/18/2016 2:35 PM

## 2016-08-18 NOTE — Telephone Encounter (Signed)
Pt called back, after finding out that Dr. Rockey Situ does not do sleep apnea, has decided she would like to stay with DR. Turner.

## 2016-08-19 NOTE — Telephone Encounter (Signed)
Follow Up:    Returning call from yesterday,she said it was from the triage nurse.

## 2016-08-19 NOTE — Telephone Encounter (Signed)
Disregard patient's request for wanting to switch to Dr. Arta Bruce. Patient wants to stay with Dr. Radford Pax. Patient stated she is suppose to follow-up in 6 months, but she wants to wait until the beginning of next year. Patient stated after her heart cath, Dr. Aundra Dubin told her that she didn't need to follow-up right away, that her heart is good. Encouraged patient to make an appointment sooner than later due to schedules being full. Patient verbalized understanding, but she will call our office at the beginning of the year.

## 2016-08-20 ENCOUNTER — Encounter (HOSPITAL_COMMUNITY): Payer: Medicare PPO

## 2016-08-31 ENCOUNTER — Encounter: Payer: Medicare PPO | Attending: Internal Medicine

## 2016-08-31 ENCOUNTER — Encounter: Payer: Self-pay | Admitting: Respiratory Therapy

## 2016-08-31 DIAGNOSIS — G4733 Obstructive sleep apnea (adult) (pediatric): Secondary | ICD-10-CM

## 2016-08-31 DIAGNOSIS — I2721 Secondary pulmonary arterial hypertension: Secondary | ICD-10-CM

## 2016-08-31 NOTE — Progress Notes (Signed)
Pulmonary Individual Treatment Plan  Patient Details  Name: Amy Vang MRN: [id number] Date of Birth: 1946-11-21 Referring Provider:   Flowsheet Row Pulmonary Rehab from 06/23/2016 in [facility] Card[facility] and Pulmonary Rehab  Referring Provider  Ramaswamy      Initial Encounter Date:  Flowsheet Row Pulmonary Rehab from 06/23/2016 in [facility] Cardiac and Pulmonary Rehab  Date  06/23/16  Referring Provider  Ramaswamy      Visit Diagnosis: PAH (pulmonary artery hypertension)  Obstructive sleep apnea syndrome  Patient's Home Medications on Admission:  Current Outpatient Prescriptions:    acetaminophen (TYLENOL) 500 MG tablet, Take 1,000 mg by mouth every 6 (six) hours as needed for moderate pain., Disp: , Rfl:    aspirin EC 81 MG tablet, Take 81 mg by mouth daily., Disp: , Rfl:    Calcium Carbonate-Vitamin D (CALCIUM PLUS VITAMIN D PO), Take 1 tablet by mouth daily., Disp: , Rfl:    citalopram (CELEXA) 20 MG tablet, 10 mg oral daily, Disp: , Rfl: 2   clonazePAM (KLONOPIN) 1 MG tablet, Take 0.5 mg by mouth at bedtime. , Disp: , Rfl:    esomeprazole (NEXIUM) 40 MG capsule, Take 40 mg by mouth 2 (two) times daily., Disp: , Rfl:    fexofenadine (ALLEGRA) 60 MG tablet, Take 60 mg by mouth daily., Disp: , Rfl:    hydrochlorothiazide (HYDRODIURIL) 25 MG tablet, Take 25 mg by mouth daily., Disp: , Rfl:    montelukast (SINGULAIR) 10 MG tablet, Take 10 mg by mouth at bedtime., Disp: , Rfl:    polyethylene glycol (MIRALAX / GLYCOLAX) packet, Take 17 g by mouth daily as needed for mild constipation. , Disp: , Rfl:    potassium chloride (MICRO-K) 10 MEQ CR capsule, Take 10 mEq by mouth daily., Disp: , Rfl:    ranitidine (ZANTAC) 300 MG tablet, TAKE 1 TABLET DAILY AS DIRECTED FOR REFLUX (Patient taking differently: take 300 mg oral at bedtime), Disp: 90 tablet, Rfl: 0   rOPINIRole (REQUIP) 2 MG tablet, Take 2 mg by mouth at bedtime. , Disp: , Rfl:    zolpidem (AMBIEN) 5 MG tablet, Take 5  mg by mouth at bedtime. , Disp: , Rfl:   Current Facility-Administered Medications:    triamcinolone acetonide (KENALOG) 10 MG/ML injection 10 mg, 10 mg, Other, Once, Trula Slade, DPM  Past Medical History: Past Medical History:  Diagnosis Date   Acid reflux    Actinic keratosis    Precancerous   Allergic rhinitis    Anxiety    Asthma    CVA (cerebral infarction)    Cystocele    Degenerative disc disease, cervical    Diastolic dysfunction    GERD (gastroesophageal reflux disease) PT STATES SEVERE---- CONTROLLED W/ NEXIUM   History of TIAs AGE 27  AND 2008---  NO RESIDUAL   Hypertension    Non-specific gastrointestinal complaint    OSA (obstructive sleep apnea)    Severe CPAP 12 MM H2O- Dr Radford Pax   Pulmonary HTN    Moderate with PASP 7mHg by echo 03/2016   Restless leg syndrome    Rosacea    SUI (stress urinary incontinence, female)     Tobacco Use: History  Smoking Status   Never Smoker  Smokeless Tobacco   Never Used    Labs: Recent Review Flowsheet Data    Labs for ITP Cardiac and Pulmonary Rehab Latest Ref Rng & Units 04/11/2012 04/12/2012 06/26/2016 08/13/2016 08/13/2016   Cholestrol 0 - 200 mg/dL - 142 - - -  LDLCALC 0 - 99 mg/dL - 75 - - -   HDL >39 mg/dL - 29(L) - - -   Trlycerides <150 mg/dL - 192(H) - - -   Hemoglobin A1c <5.7 % - 6.0(H) - - -   HCO3 20.0 - 28.0 mmol/L - - - 30.6(H) 28.5(H)   TCO2 0 - 100 mmol/L 27 - 25 32 30   O2SAT % - - - 63.0 64.0       ADL UCSD:     Pulmonary Assessment Scores    Row Name 06/23/16 1213         ADL UCSD   ADL Phase Entry     SOB Score total 67     Rest 0     Walk 1     Stairs 5     Bath 0     Dress 0     Shop 5        Pulmonary Function Assessment:     Pulmonary Function Assessment - 06/23/16 1211      Pulmonary Function Tests   FVC% 89 %   FEV1% 94 %   FEV1/FVC Ratio 80   DLCO% 64 %     Initial Spirometry Results   Comments Test date: 04/16/16     Breath    Bilateral Breath Sounds Clear   Shortness of Breath Yes      Exercise Target Goals:    Exercise Program Goal: Individual exercise prescription set with THRR, safety & activity barriers. Participant demonstrates ability to understand and report RPE using BORG scale, to self-measure pulse accurately, and to acknowledge the importance of the exercise prescription.  Exercise Prescription Goal: Starting with aerobic activity 30 plus minutes a day, 3 days per week for initial exercise prescription. Provide home exercise prescription and guidelines that participant acknowledges understanding prior to discharge.  Activity Barriers & Risk Stratification:     Activity Barriers & Cardiac Risk Stratification - 06/23/16 1210      Activity Barriers & Cardiac Risk Stratification   Activity Barriers Deconditioning;Shortness of Breath   Cardiac Risk Stratification Moderate      6 Minute Walk:     6 Minute Walk    Row Name 06/23/16 1536         6 Minute Walk   Distance 1352.5 feet     Walk Time 6 minutes     # of Rest Breaks 0     MPH 2.56     METS 3.19     RPE 12     Perceived Dyspnea  3     VO2 Peak 11.17     Symptoms No        Initial Exercise Prescription:     Initial Exercise Prescription - 06/23/16 1500      Date of Initial Exercise RX and Referring Provider   Date 06/23/16   Referring Provider Ramaswamy     Treadmill   MPH 2.5   Grade 0.5   Minutes 15   METs 3.09     NuStep   Level 2   Watts 50   Minutes 15   METs 3     REL-XR   Level 1   Watts 50   Minutes 15   METs 3     Prescription Details   Frequency (times per week) 3   Duration Progress to 45 minutes of aerobic exercise without signs/symptoms of physical distress     Intensity   THRR 40-80% of Max Heartrate 102-135  Ratings of Perceived Exertion 11-15   Perceived Dyspnea 0-4     Progression   Progression Continue to progress workloads to maintain intensity without signs/symptoms of  physical distress.     Resistance Training   Training Prescription Yes   Weight 2   Reps 10-15      Perform Capillary Blood Glucose checks as needed.  Exercise Prescription Changes:     Exercise Prescription Changes    Row Name 07/06/16 1400             Response to Exercise   Blood Pressure (Admit) 160/80       Blood Pressure (Exercise) 166/70       Blood Pressure (Exit) 134/76       Heart Rate (Admit) 72 bpm       Heart Rate (Exercise) 103 bpm       Heart Rate (Exit) 75 bpm       Oxygen Saturation (Admit) 97 %       Oxygen Saturation (Exercise) 94 %       Oxygen Saturation (Exit) 95 %       Rating of Perceived Exertion (Exercise) 13       Perceived Dyspnea (Exercise) 3.5       Duration Progress to 30 minutes of continuous aerobic without signs/symptoms of physical distress       Intensity THRR unchanged  102-135         Progression   Progression Continue progressive overload as per policy without signs/symptoms or physical distress.         Resistance Training   Training Prescription Yes       Weight 2       Reps 10-15         Treadmill   MPH 2       Grade 0       Minutes 15       METs 2.5         NuStep   Level 2       Watts 50       Minutes 15       METs 3         REL-XR   Level 1       Watts 50       Minutes 15       METs 3          Exercise Comments:     Exercise Comments    Row Name 07/06/16 1441 07/15/16 1426 07/29/16 1543 08/11/16 1542     Exercise Comments  First full day of exercise!  Patient was oriented to gym and equipment including functions, settings, policies, and procedures.  Patient's individual exercise prescription and treatment plan were reviewed.  All starting workloads were established based on the results of the 6 minute walk test done at initial orientation visit.  The plan for exercise progression was also introduced and progression will be customized based on patient's performance and goals. Pt has been out with swollen  foot since first visit. Pt continues to stay out due to plantar facisitis. Pt has not attended since last review.       Discharge Exercise Prescription (Final Exercise Prescription Changes):     Exercise Prescription Changes - 07/06/16 1400      Response to Exercise   Blood Pressure (Admit) 160/80   Blood Pressure (Exercise) 166/70   Blood Pressure (Exit) 134/76   Heart Rate (Admit) 72 bpm   Heart Rate (Exercise) 103  bpm   Heart Rate (Exit) 75 bpm   Oxygen Saturation (Admit) 97 %   Oxygen Saturation (Exercise) 94 %   Oxygen Saturation (Exit) 95 %   Rating of Perceived Exertion (Exercise) 13   Perceived Dyspnea (Exercise) 3.5   Duration Progress to 30 minutes of continuous aerobic without signs/symptoms of physical distress   Intensity THRR unchanged  102-135     Progression   Progression Continue progressive overload as per policy without signs/symptoms or physical distress.     Resistance Training   Training Prescription Yes   Weight 2   Reps 10-15     Treadmill   MPH 2   Grade 0   Minutes 15   METs 2.5     NuStep   Level 2   Watts 50   Minutes 15   METs 3     REL-XR   Level 1   Watts 50   Minutes 15   METs 3       Nutrition:  Target Goals: Understanding of nutrition guidelines, daily intake of sodium <1552m, cholesterol <2016m calories 30% from fat and 7% or less from saturated fats, daily to have 5 or more servings of fruits and vegetables.  Biometrics:     Pre Biometrics - 06/23/16 1535      Pre Biometrics   Height 5' 2.25" (1.581 Vang)   Weight 190 lb 12.8 oz (86.5 kg)   Waist Circumference 40.25 inches   Hip Circumference 44.5 inches   Waist to Hip Ratio 0.9 %   BMI (Calculated) 34.7       Nutrition Therapy Plan and Nutrition Goals:     Nutrition Therapy & Goals - 06/23/16 1218      Nutrition Therapy   Diet Amy MuErnestina Columbiaould like to meet with the dietitian. Her portion sizes are moderate amounts; moderate amount of salt; very few  sweets; and she drinks 40+ ounces of water.      Nutrition Discharge: Rate Your Plate Scores:   Psychosocial: Target Goals: Acknowledge presence or absence of depression, maximize coping skills, provide positive support system. Participant is able to verbalize types and ability to use techniques and skills needed for reducing stress and depression.  Initial Review & Psychosocial Screening:     Initial Psych Review & Screening - 06/23/16 1228      Family Dynamics   Good Support System? Yes   Comments Amy Vang have good family support from her 2 children and husband. She does have trouble sleeping, although she is very compliant with her CPAP. She  has some anxiousness about her PHA diagnosis and is interested in learning more about this diagnosis.  iagnosis and lac     Barriers   Psychosocial barriers to participate in program The patient should benefit from training in stress management and relaxation.     Screening Interventions   Interventions Encouraged to exercise;Program counselor consult      Quality of Life Scores:     Quality of Life - 06/23/16 1232      Quality of Life Scores   Health/Function Pre 21 %   Socioeconomic Pre 20.88 %   Psych/Spiritual Pre 21 %   Family Pre 21 %   GLOBAL Pre 20.81 %      PHQ-9: Recent Review Flowsheet Data    [facility]   Decreased Interest 1   Down, Depressed, Hopeless 1   PHQ - 2 Score 2   Altered sleeping 3  Tired, decreased energy 2   Change in appetite 0   Feeling bad or failure about yourself  0   Trouble concentrating 0   Moving slowly or fidgety/restless 0   Suicidal thoughts 0   PHQ-9 Score 7   Difficult doing work/chores Somewhat difficult      Psychosocial Evaluation and Intervention:     Psychosocial Evaluation - 07/06/16 1021      Psychosocial Evaluation & Interventions   Comments Counselor met with Amy Vang (Amy Vang) today for initial psychosocial evaluation.  She  is a 70 year old female who has some pulmonary and possible cardiology issues and is seeing a Dr. to confirm her Dx later this month.   She has a strong support system with a spouse of 39 years and a son and daughter who live close by as well as active involvement in her local church community.  Amy Vang states she had a TIA (1) week ago and has been released to attend this program by her Dr.  She also has sleep apnea and typically sleeps 6-7 hours per night with the help of medications prescribed, and her CPAP machine.  Amy Vang reports having a history of depression/anxiety and is on medication for this currently that is managing the symptoms adequately.  She states she is typically in a positive mood and has minimal stress in her life other than some concern for her health and her spouse.  Amy Vang has goals to improve her breathing; lose weight and decrease the number of medications she is taking while in this program.  Counselor will be following with Amy. Vang throughout the course of Tx.     Continued Psychosocial Services Needed Yes  Amy. Vang will benefit from the psychoeducational components of this program, as well as meeting with the dietician to address her weight loss goals.        Psychosocial Re-Evaluation:  Education: Education Goals: Education classes will be provided on a weekly basis, covering required topics. Participant will state understanding/return demonstration of topics presented.  Learning Barriers/Preferences:     Learning Barriers/Preferences - 06/23/16 1211      Learning Barriers/Preferences   Learning Barriers None   Learning Preferences Group Instruction;Individual Instruction;Pictoral;Skilled Demonstration;Verbal Instruction;Video;Written Material      Education Topics: Initial Evaluation Education: - Verbal, written and demonstration of respiratory meds, RPE/PD scales, oximetry and breathing techniques. Instruction on use of nebulizers and MDIs: cleaning and proper use,  rinsing mouth with steroid doses and importance of monitoring MDI activations. Flowsheet Row Pulmonary Rehab from 07/06/2016 in [facility] Cardiac and Pulmonary Rehab  Date  06/23/16  Educator  LB  Instruction Review Code  2- meets goals/outcomes      General Nutrition Guidelines/Fats and Fiber: -Group instruction provided by verbal, written material, models and posters to present the general guidelines for heart healthy nutrition. Gives an explanation and review of dietary fats and fiber.   Controlling Sodium/Reading Food Labels: -Group verbal and written material supporting the discussion of sodium use in heart healthy nutrition. Review and explanation with models, verbal and written materials for utilization of the food label.   Exercise Physiology & Risk Factors: - Group verbal and written instruction with models to review the exercise physiology of the cardiovascular system and associated critical values. Details cardiovascular disease risk factors and the goals associated with each risk factor.   Aerobic Exercise & Resistance Training: - Gives group verbal and written discussion on the health impact of inactivity. On the components of  aerobic and resistive training programs and the benefits of this training and how to safely progress through these programs.   Flexibility, Balance, General Exercise Guidelines: - Provides group verbal and written instruction on the benefits of flexibility and balance training programs. Provides general exercise guidelines with specific guidelines to those with heart or lung disease. Demonstration and skill practice provided.   Stress Management: - Provides group verbal and written instruction about the health risks of elevated stress, cause of high stress, and healthy ways to reduce stress.   Depression: - Provides group verbal and written instruction on the correlation between heart/lung disease and depressed mood, treatment options, and the stigmas  associated with seeking treatment.   Exercise & Equipment Safety: - Individual verbal instruction and demonstration of equipment use and safety with use of the equipment. Flowsheet Row Pulmonary Rehab from 07/06/2016 in [facility] Cardiac and Pulmonary Rehab  Date  07/06/16  Educator  [facility]  Instruction Review Code  2- meets goals/outcomes      Infection Prevention: - Provides verbal and written material to individual with discussion of infection control including proper hand washing and proper equipment cleaning during exercise session. Flowsheet Row Pulmonary Rehab from 07/06/2016 in [facility] Cardiac and Pulmonary Rehab  Date  07/06/16  Educator  [facility]  Instruction Review Code  2- meets goals/outcomes      Falls Prevention: - Provides verbal and written material to individual with discussion of falls prevention and safety. Flowsheet Row Pulmonary Rehab from 07/06/2016 in [facility] Cardiac and Pulmonary Rehab  Date  06/23/16  Educator  LB  Instruction Review Code  2- meets goals/outcomes      Diabetes: - Individual verbal and written instruction to review signs/symptoms of diabetes, desired ranges of glucose level fasting, after meals and with exercise. Advice that pre and post exercise glucose checks will be done for 3 sessions at entry of program.   Chronic Lung Diseases: - Group verbal and written instruction to review new updates, new respiratory medications, new advancements in procedures and treatments. Provide informative websites and "800" numbers of self-education.   Lung Procedures: - Group verbal and written instruction to describe testing methods done to diagnose lung disease. Review the outcome of test results. Describe the treatment choices: Pulmonary Function Tests, ABGs and oximetry.   Energy Conservation: - Provide group verbal and written instruction for methods to conserve energy, plan and organize activities. Instruct on pacing techniques, use of adaptive equipment and  posture/positioning to relieve shortness of breath.   Triggers: - Group verbal and written instruction to review types of environmental controls: home humidity, furnaces, filters, dust mite/pet prevention, HEPA vacuums. To discuss weather changes, air quality and the benefits of nasal washing.   Exacerbations: - Group verbal and written instruction to provide: warning signs, infection symptoms, calling MD promptly, preventive modes, and value of vaccinations. Review: effective airway clearance, coughing and/or vibration techniques. Create an Sports administrator.   Oxygen: - Individual and group verbal and written instruction on oxygen therapy. Includes supplement oxygen, available portable oxygen systems, continuous and intermittent flow rates, oxygen safety, concentrators, and Medicare reimbursement for oxygen.   Respiratory Medications: - Group verbal and written instruction to review medications for lung disease. Drug class, frequency, complications, importance of spacers, rinsing mouth after steroid MDI's, and proper cleaning methods for nebulizers. Flowsheet Row Pulmonary Rehab from 07/06/2016 in [facility] Cardiac and Pulmonary Rehab  Date  06/23/16  Educator  LB  Instruction Review Code  2- meets goals/outcomes      AED/CPR: -  Group verbal and written instruction with the use of models to demonstrate the basic use of the AED with the basic ABC's of resuscitation.   Breathing Retraining: - Provides individuals verbal and written instruction on purpose, frequency, and proper technique of diaphragmatic breathing and pursed-lipped breathing. Applies individual practice skills. Flowsheet Row Pulmonary Rehab from 07/06/2016 in [facility] Cardiac and Pulmonary Rehab  Date  07/06/16  Educator  [facility]  Instruction Review Code  2- meets goals/outcomes      Anatomy and Physiology of the Lungs: - Group verbal and written instruction with the use of models to provide basic lung anatomy and physiology related to  function, structure and complications of lung disease.   Heart Failure: - Group verbal and written instruction on the basics of heart failure: signs/symptoms, treatments, explanation of ejection fraction, enlarged heart and cardiomyopathy.   Sleep Apnea: - Individual verbal and written instruction to review Obstructive Sleep Apnea. Review of risk factors, methods for diagnosing and types of masks and machines for OSA. Flowsheet Row Pulmonary Rehab from 07/06/2016 in [facility] Cardiac and Pulmonary Rehab  Date  06/23/16  Educator  LB  Instruction Review Code  2- meets goals/outcomes      Anxiety: - Provides group, verbal and written instruction on the correlation between heart/lung disease and anxiety, treatment options, and management of anxiety.   Relaxation: - Provides group, verbal and written instruction about the benefits of relaxation for patients with heart/lung disease. Also provides patients with examples of relaxation techniques.   Knowledge Questionnaire Score:     Knowledge Questionnaire Score - 06/23/16 1211      Knowledge Questionnaire Score   Pre Score 8/10       Core Components/Risk Factors/Patient Goals at Admission:     Personal Goals and Risk Factors at Admission - 06/23/16 1223      Core Components/Risk Factors/Patient Goals on Admission    Weight Management Yes;Weight Loss  Amy Vang would like to meet with the dietitian. Her portion sizes are moderate amounts; moderate amount of salt; very few sweets; and she drinks 40+ ounces of water   Intervention Weight Management: Develop a combined nutrition and exercise program designed to reach desired caloric intake, while maintaining appropriate intake of nutrient and fiber, sodium and fats, and appropriate energy expenditure required for the weight goal.;Weight Management: Provide education and appropriate resources to help participant work on and attain dietary goals.;Weight Management/Obesity: Establish reasonable  short term and long term weight goals.;Obesity: Provide education and appropriate resources to help participant work on and attain dietary goals.   Admit Weight 190 lb 12.8 oz (86.5 kg)   Goal Weight: Short Term 170 lb (77.1 kg)   Goal Weight: Long Term 160 lb (72.6 kg)   Expected Outcomes Short Term: Continue to assess and modify interventions until short term weight is achieved;Long Term: Adherence to nutrition and physical activity/exercise program aimed toward attainment of established weight goal;Weight Loss: Understanding of general recommendations for a balanced deficit meal plan, which promotes 1-2 lb weight loss per week and includes a negative energy balance of [phone] kcal/d;Understanding recommendations for meals to include 15-35% energy as protein, 25-35% energy from fat, 35-60% energy from carbohydrates, less than 268m of dietary cholesterol, 20-35 gm of total fiber daily;Understanding of distribution of calorie intake throughout the day with the consumption of 4-5 meals/snacks   Sedentary Yes  Member of [employer]   Intervention Provide advice, education, support and counseling about physical activity/exercise needs.;Develop an individualized exercise prescription for aerobic and  resistive training based on initial evaluation findings, risk stratification, comorbidities and participant's personal goals.   Expected Outcomes Achievement of increased cardiorespiratory fitness and enhanced flexibility, muscular endurance and strength shown through measurements of functional capacity and personal statement of participant.   Increase Strength and Stamina Yes   Intervention Provide advice, education, support and counseling about physical activity/exercise needs.;Develop an individualized exercise prescription for aerobic and resistive training based on initial evaluation findings, risk stratification, comorbidities and participant's personal goals.   Expected Outcomes Achievement of  increased cardiorespiratory fitness and enhanced flexibility, muscular endurance and strength shown through measurements of functional capacity and personal statement of participant.   Improve shortness of breath with ADL's Yes   Intervention Provide education, individualized exercise plan and daily activity instruction to help decrease symptoms of SOB with activities of daily living.   Expected Outcomes Short Term: Achieves a reduction of symptoms when performing activities of daily living.   Develop more efficient breathing techniques such as purse lipped breathing and diaphragmatic breathing; and practicing self-pacing with activity Yes   Intervention Provide education, demonstration and support about specific breathing techniuqes utilized for more efficient breathing. Include techniques such as pursed lipped breathing, diaphragmatic breathing and self-pacing activity.   Expected Outcomes Short Term: Participant will be able to demonstrate and use breathing techniques as needed throughout daily activities.   Increase knowledge of respiratory medications and ability to use respiratory devices properly  Yes  Uses Albuterol MDI rarely, although carries in her purse. CPAP at 12cmH2O pressure, nasal mask, very compliant.   Intervention Provide education and demonstration as needed of appropriate use of medications, inhalers, and oxygen therapy.   Expected Outcomes Short Term: Achieves understanding of medications use. Understands that oxygen is a medication prescribed by physician. Demonstrates appropriate use of inhaler and oxygen therapy.   Hypertension Yes   Intervention Provide education on lifestyle modifcations including regular physical activity/exercise, weight management, moderate sodium restriction and increased consumption of fresh fruit, vegetables, and low fat dairy, alcohol moderation, and smoking cessation.;Monitor prescription use compliance.   Expected Outcomes Short Term: Continued  assessment and intervention until BP is < 140/43m HG in hypertensive participants. < 130/88mHG in hypertensive participants with diabetes, heart failure or chronic kidney disease.;Long Term: Maintenance of blood pressure at goal levels.   Lipids Yes   Intervention Provide education and support for participant on nutrition & aerobic/resistive exercise along with prescribed medications to achieve LDL <7039mHDL >32m12m Expected Outcomes Short Term: Participant states understanding of desired cholesterol values and is compliant with medications prescribed. Participant is following exercise prescription and nutrition guidelines.;Long Term: Cholesterol controlled with medications as prescribed, with individualized exercise RX and with personalized nutrition plan. Value goals: LDL < 70mg29mL > 40 mg.      Core Components/Risk Factors/Patient Goals Review:      Goals and Risk Factor Review    Row Name 07/06/16 1430             Core Components/Risk Factors/Patient Goals Review   Personal Goals Review Develop more efficient breathing techniques such as purse lipped breathing and diaphragmatic breathing and practicing self-pacing with activity.       Review PLB was discussed with patient as to how it can help control SOB during exercise and daily activities. The patient demonstrated understanding of this technique.        Expected Outcomes The patient will continue to use PLB during exercise class as well as during daily activites to better control SOB  and thus be able to increase strength and stamina.           Core Components/Risk Factors/Patient Goals at Discharge (Final Review):      Goals and Risk Factor Review - 07/06/16 1430      Core Components/Risk Factors/Patient Goals Review   Personal Goals Review Develop more efficient breathing techniques such as purse lipped breathing and diaphragmatic breathing and practicing self-pacing with activity.   Review PLB was discussed with patient  as to how it can help control SOB during exercise and daily activities. The patient demonstrated understanding of this technique.    Expected Outcomes The patient will continue to use PLB during exercise class as well as during daily activites to better control SOB and thus be able to increase strength and stamina.       ITP Comments:     ITP Comments    Row Name 07/06/16 1441 07/08/16 0915 07/20/16 1515 08/18/16 1436     ITP Comments The patient did experience chest pain of 6.5-7 out of 10 while walking on the TM. When workloads were decreased the pain improved, and when the TM was stopped the pain was completely resolved. She did not exerperience these symptoms on the other machines. TM walking speed will be decreased to below her pain threshold. She is in close contact with her doctor about these symptoms and has an appointment with a cardiologist this month.  Amy Vang called to inform us that her right foot did swell after class. Her physician made an appointment for her, received a cortizone shot, and needs to rest her foot 8-10days. Amy Vang was cleared by Dr Chase Caller to return to [place], but has now developed some plantar fasciitis and will miss this week of exercise. Called to check on status of return to rehab.  Feeling like she is ready to come back from the plantar fascitis stand point.  She is going to beach for week.  Her husband needs back surgery.  She hopes to the second week of October.       Comments: 30 day note review

## 2016-09-08 ENCOUNTER — Encounter: Payer: Self-pay | Admitting: Internal Medicine

## 2016-09-08 NOTE — Telephone Encounter (Signed)
MR, Please advise on patient advise e-mail. Patient would like to know if she needs to keep her appt as scheduled for 09/10/16 as she has not been to [facility] rehab very much since last visit d/t being injured during her first visit. Thanks!  ----- Message -----    From: Amy Vang    Sent: 09/08/2016  1:07 AM EDT      To: Brand Males, MD Subject: Visit Follow-Up Question I have an appointment this Thursday and wondered if I need to keep it or make it later. I was assigned to Pulmonary Rehab.... but on my first exercise day.... my exercise bike/seat was too far back and my  foot kept slipping off the peddle - injuring my right ankle.   I have been to my Podatrists twice. I can probably start the Rehab back now - but have only gone once. Should I still keep my appointment on Thursday? Thank you, Amy Vang

## 2016-09-10 ENCOUNTER — Ambulatory Visit: Payer: Medicare PPO | Admitting: Internal Medicine

## 2016-09-10 NOTE — Telephone Encounter (Signed)
   Ok to cancel 10.30am for [employer]  Because she is just starting rehab. ROV in 2 months AM appt

## 2016-09-10 NOTE — Telephone Encounter (Signed)
Pt rescheduled for 11/02/16 at 11:15 Nothing further needed.

## 2016-09-11 ENCOUNTER — Telehealth: Payer: Self-pay

## 2016-09-11 NOTE — Telephone Encounter (Signed)
LMOM

## 2016-09-15 ENCOUNTER — Telehealth: Payer: Self-pay | Admitting: Respiratory Therapy

## 2016-09-15 ENCOUNTER — Encounter: Payer: Self-pay | Admitting: Respiratory Therapy

## 2016-09-18 DIAGNOSIS — J342 Deviated nasal septum: Secondary | ICD-10-CM | POA: Insufficient documentation

## 2016-09-28 ENCOUNTER — Encounter: Payer: Self-pay | Admitting: *Deleted

## 2016-09-28 DIAGNOSIS — G4733 Obstructive sleep apnea (adult) (pediatric): Secondary | ICD-10-CM

## 2016-09-28 DIAGNOSIS — I2721 Secondary pulmonary arterial hypertension: Secondary | ICD-10-CM

## 2016-09-28 NOTE — Progress Notes (Signed)
Pulmonary Individual Treatment Plan  Patient Details  Name: Amy Vang MRN: [id number] Date of Birth: [birth year] Referring Provider:   Flowsheet Row Pulmonary Rehab from 06/23/2016 in [facility] Cardiac and Pulmonary Rehab  Referring Provider  Ramaswamy      Initial Encounter Date:  Flowsheet Row Pulmonary Rehab from 06/23/2016 in [facility] Cardiac and Pulmonary Rehab  Date  06/23/16  Referring Provider  Ramaswamy      Visit Diagnosis: PAH (pulmonary artery hypertension)  Obstructive sleep apnea syndrome  Patient's Home Medications on Admission:  Current Outpatient Prescriptions:  .  acetaminophen (TYLENOL) 500 MG tablet, Take 1,000 mg by mouth every 6 (six) hours as needed for moderate pain., Disp: , Rfl:  .  aspirin EC 81 MG tablet, Take 81 mg by mouth daily., Disp: , Rfl:  .  Calcium Carbonate-Vitamin D (CALCIUM PLUS VITAMIN D PO), Take 1 tablet by mouth daily., Disp: , Rfl:  .  citalopram (CELEXA) 20 MG tablet, 10 mg oral daily, Disp: , Rfl: 2 .  clonazePAM (KLONOPIN) 1 MG tablet, Take 0.5 mg by mouth at bedtime. , Disp: , Rfl:  .  esomeprazole (NEXIUM) 40 MG capsule, Take 40 mg by mouth 2 (two) times daily., Disp: , Rfl:  .  fexofenadine (ALLEGRA) 60 MG tablet, Take 60 mg by mouth daily., Disp: , Rfl:  .  hydrochlorothiazide (HYDRODIURIL) 25 MG tablet, Take 25 mg by mouth daily., Disp: , Rfl:  .  montelukast (SINGULAIR) 10 MG tablet, Take 10 mg by mouth at bedtime., Disp: , Rfl:  .  polyethylene glycol (MIRALAX / GLYCOLAX) packet, Take 17 g by mouth daily as needed for mild constipation. , Disp: , Rfl:  .  potassium chloride (MICRO-K) 10 MEQ CR capsule, Take 10 mEq by mouth daily., Disp: , Rfl:  .  ranitidine (ZANTAC) 300 MG tablet, TAKE 1 TABLET DAILY AS DIRECTED FOR REFLUX (Patient taking differently: take 300 mg oral at bedtime), Disp: 90 tablet, Rfl: 0 .  rOPINIRole (REQUIP) 2 MG tablet, Take 2 mg by mouth at bedtime. , Disp: , Rfl:  .  zolpidem (AMBIEN) 5 MG tablet, Take 5  mg by mouth at bedtime. , Disp: , Rfl:   Current Facility-Administered Medications:  .  triamcinolone acetonide (KENALOG) 10 MG/ML injection 10 mg, 10 mg, Other, Once, Trula Slade, DPM  Past Medical History: Past Medical History:  Diagnosis Date  . Acid reflux   . Actinic keratosis    Precancerous  . Allergic rhinitis   . Anxiety   . Asthma   . CVA (cerebral infarction)   . Cystocele   . Degenerative disc disease, cervical   . Diastolic dysfunction   . GERD (gastroesophageal reflux disease) PT STATES SEVERE---- CONTROLLED W/ NEXIUM  . History of TIAs AGE 88  AND 2008---  NO RESIDUAL  . Hypertension   . Non-specific gastrointestinal complaint   . OSA (obstructive sleep apnea)    Severe CPAP 12 MM H2O- Dr Radford Pax  . Pulmonary HTN    Moderate with PASP 10mHg by echo 03/2016  . Restless leg syndrome   . Rosacea   . SUI (stress urinary incontinence, female)     Tobacco Use: History  Smoking Status  . Never Smoker  Smokeless Tobacco  . Never Used    Labs: Recent Review Flowsheet Data    Labs for ITP Cardiac and Pulmonary Rehab Latest Ref Rng & Units 04/11/2012 04/12/2012 06/26/2016 08/13/2016 08/13/2016   Cholestrol 0 - 200 mg/dL - 142 - - -  LDLCALC 0 - 99 mg/dL - 75 - - -   HDL >39 mg/dL - 29(L) - - -   Trlycerides <150 mg/dL - 192(H) - - -   Hemoglobin A1c <5.7 % - 6.0(H) - - -   HCO3 20.0 - 28.0 mmol/L - - - 30.6(H) 28.5(H)   TCO2 0 - 100 mmol/L 27 - 25 32 30   O2SAT % - - - 63.0 64.0       ADL UCSD:     Pulmonary Assessment Scores    Row Name 06/23/16 1213         ADL UCSD   ADL Phase Entry     SOB Score total 67     Rest 0     Walk 1     Stairs 5     Bath 0     Dress 0     Shop 5        Pulmonary Function Assessment:     Pulmonary Function Assessment - 06/23/16 1211      Pulmonary Function Tests   FVC% 89 %   FEV1% 94 %   FEV1/FVC Ratio 80   DLCO% 64 %     Initial Spirometry Results   Comments Test date: 04/16/16     Breath    Bilateral Breath Sounds Clear   Shortness of Breath Yes      Exercise Target Goals:    Exercise Program Goal: Individual exercise prescription set with THRR, safety & activity barriers. Participant demonstrates ability to understand and report RPE using BORG scale, to self-measure pulse accurately, and to acknowledge the importance of the exercise prescription.  Exercise Prescription Goal: Starting with aerobic activity 30 plus minutes a day, 3 days per week for initial exercise prescription. Provide home exercise prescription and guidelines that participant acknowledges understanding prior to discharge.  Activity Barriers & Risk Stratification:     Activity Barriers & Cardiac Risk Stratification - 06/23/16 1210      Activity Barriers & Cardiac Risk Stratification   Activity Barriers Deconditioning;Shortness of Breath   Cardiac Risk Stratification Moderate      6 Minute Walk:     6 Minute Walk    Row Name 06/23/16 1536         6 Minute Walk   Distance 1352.5 feet     Walk Time 6 minutes     # of Rest Breaks 0     MPH 2.56     METS 3.19     RPE 12     Perceived Dyspnea  3     VO2 Peak 11.17     Symptoms No        Initial Exercise Prescription:     Initial Exercise Prescription - 06/23/16 1500      Date of Initial Exercise RX and Referring Provider   Date 06/23/16   Referring Provider Ramaswamy     Treadmill   MPH 2.5   Grade 0.5   Minutes 15   METs 3.09     NuStep   Level 2   Watts 50   Minutes 15   METs 3     REL-XR   Level 1   Watts 50   Minutes 15   METs 3     Prescription Details   Frequency (times per week) 3   Duration Progress to 45 minutes of aerobic exercise without signs/symptoms of physical distress     Intensity   THRR 40-80% of Max Heartrate 102-135  Ratings of Perceived Exertion 11-15   Perceived Dyspnea 0-4     Progression   Progression Continue to progress workloads to maintain intensity without signs/symptoms of  physical distress.     Resistance Training   Training Prescription Yes   Weight 2   Reps 10-15      Perform Capillary Blood Glucose checks as needed.  Exercise Prescription Changes:     Exercise Prescription Changes    Row Name 07/06/16 1400             Response to Exercise   Blood Pressure (Admit) 160/80       Blood Pressure (Exercise) 166/70       Blood Pressure (Exit) 134/76       Heart Rate (Admit) 72 bpm       Heart Rate (Exercise) 103 bpm       Heart Rate (Exit) 75 bpm       Oxygen Saturation (Admit) 97 %       Oxygen Saturation (Exercise) 94 %       Oxygen Saturation (Exit) 95 %       Rating of Perceived Exertion (Exercise) 13       Perceived Dyspnea (Exercise) 3.5       Duration Progress to 30 minutes of continuous aerobic without signs/symptoms of physical distress       Intensity THRR unchanged  102-135         Progression   Progression Continue progressive overload as per policy without signs/symptoms or physical distress.         Resistance Training   Training Prescription Yes       Weight 2       Reps 10-15         Treadmill   MPH 2       Grade 0       Minutes 15       METs 2.5         NuStep   Level 2       Watts 50       Minutes 15       METs 3         REL-XR   Level 1       Watts 50       Minutes 15       METs 3          Exercise Comments:     Exercise Comments    Row Name 07/06/16 1441 07/15/16 1426 07/29/16 1543 08/11/16 1542 09/24/16 1153   Exercise Comments  First full day of exercise!  Patient was oriented to gym and equipment including functions, settings, policies, and procedures.  Patient's individual exercise prescription and treatment plan were reviewed.  All starting workloads were established based on the results of the 6 minute walk test done at initial orientation visit.  The plan for exercise progression was also introduced and progression will be customized based on patient's performance and goals. Pt has been out  with swollen foot since first visit. Pt continues to stay out due to plantar facisitis. Pt has not attended since last review. Pria has not attended since 07/06/16 due to plantar fascitis.      Discharge Exercise Prescription (Final Exercise Prescription Changes):     Exercise Prescription Changes - 07/06/16 1400      Response to Exercise   Blood Pressure (Admit) 160/80   Blood Pressure (Exercise) 166/70   Blood Pressure (Exit) 134/76   Heart Rate (  Admit) 72 bpm   Heart Rate (Exercise) 103 bpm   Heart Rate (Exit) 75 bpm   Oxygen Saturation (Admit) 97 %   Oxygen Saturation (Exercise) 94 %   Oxygen Saturation (Exit) 95 %   Rating of Perceived Exertion (Exercise) 13   Perceived Dyspnea (Exercise) 3.5   Duration Progress to 30 minutes of continuous aerobic without signs/symptoms of physical distress   Intensity THRR unchanged  102-135     Progression   Progression Continue progressive overload as per policy without signs/symptoms or physical distress.     Resistance Training   Training Prescription Yes   Weight 2   Reps 10-15     Treadmill   MPH 2   Grade 0   Minutes 15   METs 2.5     NuStep   Level 2   Watts 50   Minutes 15   METs 3     REL-XR   Level 1   Watts 50   Minutes 15   METs 3       Nutrition:  Target Goals: Understanding of nutrition guidelines, daily intake of sodium <1564m, cholesterol <207m calories 30% from fat and 7% or less from saturated fats, daily to have 5 or more servings of fruits and vegetables.  Biometrics:     Pre Biometrics - 06/23/16 1535      Pre Biometrics   Height 5' 2.25" (1.581 m)   Weight 190 lb 12.8 oz (86.5 kg)   Waist Circumference 40.25 inches   Hip Circumference 44.5 inches   Waist to Hip Ratio 0.9 %   BMI (Calculated) 34.7       Nutrition Therapy Plan and Nutrition Goals:     Nutrition Therapy & Goals - 06/23/16 1218      Nutrition Therapy   Diet Amy MuErnestina Columbiaould like to meet with the dietitian. Her  portion sizes are moderate amounts; moderate amount of salt; very few sweets; and she drinks 40+ ounces of water.      Nutrition Discharge: Rate Your Plate Scores:   Psychosocial: Target Goals: Acknowledge presence or absence of depression, maximize coping skills, provide positive support system. Participant is able to verbalize types and ability to use techniques and skills needed for reducing stress and depression.  Initial Review & Psychosocial Screening:     Initial Psych Review & Screening - 06/23/16 1228      Family Dynamics   Good Support System? Yes   Comments Amy MuHarkoes have good family support from her 2 children and husband. She does have trouble sleeping, although she is very compliant with her CPAP. She  has some anxiousness about her PHA diagnosis and is interested in learning more about this diagnosis.  iagnosis and lac     Barriers   Psychosocial barriers to participate in program The patient should benefit from training in stress management and relaxation.     Screening Interventions   Interventions Encouraged to exercise;Program counselor consult      Quality of Life Scores:     Quality of Life - 06/23/16 1232      Quality of Life Scores   Health/Function Pre 21 %   Socioeconomic Pre 20.88 %   Psych/Spiritual Pre 21 %   Family Pre 21 %   GLOBAL Pre 20.81 %      PHQ-9: Recent Review Flowsheet Data    [facility]   Decreased Interest 1   Down, Depressed, Hopeless 1   PHQ - 2  Score 2   Altered sleeping 3   Tired, decreased energy 2   Change in appetite 0   Feeling bad or failure about yourself  0   Trouble concentrating 0   Moving slowly or fidgety/restless 0   Suicidal thoughts 0   PHQ-9 Score 7   Difficult doing work/chores Somewhat difficult      Psychosocial Evaluation and Intervention:     Psychosocial Evaluation - 07/06/16 1021      Psychosocial Evaluation & Interventions   Comments Counselor met with  Amy. Vang (Amy. Jerilynn Mages) today for initial psychosocial evaluation.  She is a 70 year old female who has some pulmonary and possible cardiology issues and is seeing a Dr. to confirm her Dx later this month.   She has a strong support system with a spouse of 22 years and a son and daughter who live close by as well as active involvement in her local church community.  Amy. Jerilynn Mages states she had a TIA (1) week ago and has been released to attend this program by her Dr.  She also has sleep apnea and typically sleeps 6-7 hours per night with the help of medications prescribed, and her CPAP machine.  Amy. Jerilynn Mages reports having a history of depression/anxiety and is on medication for this currently that is managing the symptoms adequately.  She states she is typically in a positive mood and has minimal stress in her life other than some concern for her health and her spouse.  Amy. Jerilynn Mages has goals to improve her breathing; lose weight and decrease the number of medications she is taking while in this program.  Counselor will be following with Amy. M throughout the course of Tx.     Continued Psychosocial Services Needed Yes  Amy. M will benefit from the psychoeducational components of this program, as well as meeting with the dietician to address her weight loss goals.        Psychosocial Re-Evaluation:  Education: Education Goals: Education classes will be provided on a weekly basis, covering required topics. Participant will state understanding/return demonstration of topics presented.  Learning Barriers/Preferences:     Learning Barriers/Preferences - 06/23/16 1211      Learning Barriers/Preferences   Learning Barriers None   Learning Preferences Group Instruction;Individual Instruction;Pictoral;Skilled Demonstration;Verbal Instruction;Video;Written Material      Education Topics: Initial Evaluation Education: - Verbal, written and demonstration of respiratory meds, RPE/PD scales, oximetry and breathing techniques.  Instruction on use of nebulizers and MDIs: cleaning and proper use, rinsing mouth with steroid doses and importance of monitoring MDI activations. Flowsheet Row Pulmonary Rehab from 07/06/2016 in [facility] Cardiac and Pulmonary Rehab  Date  06/23/16  Educator  LB  Instruction Review Code  2- meets goals/outcomes      General Nutrition Guidelines/Fats and Fiber: -Group instruction provided by verbal, written material, models and posters to present the general guidelines for heart healthy nutrition. Gives an explanation and review of dietary fats and fiber.   Controlling Sodium/Reading Food Labels: -Group verbal and written material supporting the discussion of sodium use in heart healthy nutrition. Review and explanation with models, verbal and written materials for utilization of the food label.   Exercise Physiology & Risk Factors: - Group verbal and written instruction with models to review the exercise physiology of the cardiovascular system and associated critical values. Details cardiovascular disease risk factors and the goals associated with each risk factor.   Aerobic Exercise & Resistance Training: - Gives group verbal and written discussion on  the health impact of inactivity. On the components of aerobic and resistive training programs and the benefits of this training and how to safely progress through these programs.   Flexibility, Balance, General Exercise Guidelines: - Provides group verbal and written instruction on the benefits of flexibility and balance training programs. Provides general exercise guidelines with specific guidelines to those with heart or lung disease. Demonstration and skill practice provided.   Stress Management: - Provides group verbal and written instruction about the health risks of elevated stress, cause of high stress, and healthy ways to reduce stress.   Depression: - Provides group verbal and written instruction on the correlation between  heart/lung disease and depressed mood, treatment options, and the stigmas associated with seeking treatment.   Exercise & Equipment Safety: - Individual verbal instruction and demonstration of equipment use and safety with use of the equipment. Flowsheet Row Pulmonary Rehab from 07/06/2016 in [facility] Cardiac and Pulmonary Rehab  Date  07/06/16  Educator  [facility]  Instruction Review Code  2- meets goals/outcomes      Infection Prevention: - Provides verbal and written material to individual with discussion of infection control including proper hand washing and proper equipment cleaning during exercise session. Flowsheet Row Pulmonary Rehab from 07/06/2016 in [facility] Cardiac and Pulmonary Rehab  Date  07/06/16  Educator  [facility]  Instruction Review Code  2- meets goals/outcomes      Falls Prevention: - Provides verbal and written material to individual with discussion of falls prevention and safety. Flowsheet Row Pulmonary Rehab from 07/06/2016 in [facility] Cardiac and Pulmonary Rehab  Date  06/23/16  Educator  LB  Instruction Review Code  2- meets goals/outcomes      Diabetes: - Individual verbal and written instruction to review signs/symptoms of diabetes, desired ranges of glucose level fasting, after meals and with exercise. Advice that pre and post exercise glucose checks will be done for 3 sessions at entry of program.   Chronic Lung Diseases: - Group verbal and written instruction to review new updates, new respiratory medications, new advancements in procedures and treatments. Provide informative websites and "800" numbers of self-education.   Lung Procedures: - Group verbal and written instruction to describe testing methods done to diagnose lung disease. Review the outcome of test results. Describe the treatment choices: Pulmonary Function Tests, ABGs and oximetry.   Energy Conservation: - Provide group verbal and written instruction for methods to conserve energy, plan and organize  activities. Instruct on pacing techniques, use of adaptive equipment and posture/positioning to relieve shortness of breath.   Triggers: - Group verbal and written instruction to review types of environmental controls: home humidity, furnaces, filters, dust mite/pet prevention, HEPA vacuums. To discuss weather changes, air quality and the benefits of nasal washing.   Exacerbations: - Group verbal and written instruction to provide: warning signs, infection symptoms, calling MD promptly, preventive modes, and value of vaccinations. Review: effective airway clearance, coughing and/or vibration techniques. Create an Sports administrator.   Oxygen: - Individual and group verbal and written instruction on oxygen therapy. Includes supplement oxygen, available portable oxygen systems, continuous and intermittent flow rates, oxygen safety, concentrators, and Medicare reimbursement for oxygen.   Respiratory Medications: - Group verbal and written instruction to review medications for lung disease. Drug class, frequency, complications, importance of spacers, rinsing mouth after steroid MDI's, and proper cleaning methods for nebulizers. Flowsheet Row Pulmonary Rehab from 07/06/2016 in [facility] Cardiac and Pulmonary Rehab  Date  06/23/16  Educator  LB  Instruction Review Code  2- meets goals/outcomes      AED/CPR: - Group verbal and written instruction with the use of models to demonstrate the basic use of the AED with the basic ABC's of resuscitation.   Breathing Retraining: - Provides individuals verbal and written instruction on purpose, frequency, and proper technique of diaphragmatic breathing and pursed-lipped breathing. Applies individual practice skills. Flowsheet Row Pulmonary Rehab from 07/06/2016 in [facility] Cardiac and Pulmonary Rehab  Date  07/06/16  Educator  [facility]  Instruction Review Code  2- meets goals/outcomes      Anatomy and Physiology of the Lungs: - Group verbal and written instruction with  the use of models to provide basic lung anatomy and physiology related to function, structure and complications of lung disease.   Heart Failure: - Group verbal and written instruction on the basics of heart failure: signs/symptoms, treatments, explanation of ejection fraction, enlarged heart and cardiomyopathy.   Sleep Apnea: - Individual verbal and written instruction to review Obstructive Sleep Apnea. Review of risk factors, methods for diagnosing and types of masks and machines for OSA. Flowsheet Row Pulmonary Rehab from 07/06/2016 in [facility] Cardiac and Pulmonary Rehab  Date  06/23/16  Educator  LB  Instruction Review Code  2- meets goals/outcomes      Anxiety: - Provides group, verbal and written instruction on the correlation between heart/lung disease and anxiety, treatment options, and management of anxiety.   Relaxation: - Provides group, verbal and written instruction about the benefits of relaxation for patients with heart/lung disease. Also provides patients with examples of relaxation techniques.   Knowledge Questionnaire Score:     Knowledge Questionnaire Score - 06/23/16 1211      Knowledge Questionnaire Score   Pre Score 8/10       Core Components/Risk Factors/Patient Goals at Admission:     Personal Goals and Risk Factors at Admission - 06/23/16 1223      Core Components/Risk Factors/Patient Goals on Admission    Weight Management Yes;Weight Loss  Amy Vang would like to meet with the dietitian. Her portion sizes are moderate amounts; moderate amount of salt; very few sweets; and she drinks 40+ ounces of water   Intervention Weight Management: Develop a combined nutrition and exercise program designed to reach desired caloric intake, while maintaining appropriate intake of nutrient and fiber, sodium and fats, and appropriate energy expenditure required for the weight goal.;Weight Management: Provide education and appropriate resources to help participant work on  and attain dietary goals.;Weight Management/Obesity: Establish reasonable short term and long term weight goals.;Obesity: Provide education and appropriate resources to help participant work on and attain dietary goals.   Admit Weight 190 lb 12.8 oz (86.5 kg)   Goal Weight: Short Term 170 lb (77.1 kg)   Goal Weight: Long Term 160 lb (72.6 kg)   Expected Outcomes Short Term: Continue to assess and modify interventions until short term weight is achieved;Long Term: Adherence to nutrition and physical activity/exercise program aimed toward attainment of established weight goal;Weight Loss: Understanding of general recommendations for a balanced deficit meal plan, which promotes 1-2 lb weight loss per week and includes a negative energy balance of [phone] kcal/d;Understanding recommendations for meals to include 15-35% energy as protein, 25-35% energy from fat, 35-60% energy from carbohydrates, less than 254m of dietary cholesterol, 20-35 gm of total fiber daily;Understanding of distribution of calorie intake throughout the day with the consumption of 4-5 meals/snacks   Sedentary Yes  Member of [employer]   Intervention Provide advice, education, support and counseling about  physical activity/exercise needs.;Develop an individualized exercise prescription for aerobic and resistive training based on initial evaluation findings, risk stratification, comorbidities and participant's personal goals.   Expected Outcomes Achievement of increased cardiorespiratory fitness and enhanced flexibility, muscular endurance and strength shown through measurements of functional capacity and personal statement of participant.   Increase Strength and Stamina Yes   Intervention Provide advice, education, support and counseling about physical activity/exercise needs.;Develop an individualized exercise prescription for aerobic and resistive training based on initial evaluation findings, risk stratification, comorbidities and  participant's personal goals.   Expected Outcomes Achievement of increased cardiorespiratory fitness and enhanced flexibility, muscular endurance and strength shown through measurements of functional capacity and personal statement of participant.   Improve shortness of breath with ADL's Yes   Intervention Provide education, individualized exercise plan and daily activity instruction to help decrease symptoms of SOB with activities of daily living.   Expected Outcomes Short Term: Achieves a reduction of symptoms when performing activities of daily living.   Develop more efficient breathing techniques such as purse lipped breathing and diaphragmatic breathing; and practicing self-pacing with activity Yes   Intervention Provide education, demonstration and support about specific breathing techniuqes utilized for more efficient breathing. Include techniques such as pursed lipped breathing, diaphragmatic breathing and self-pacing activity.   Expected Outcomes Short Term: Participant will be able to demonstrate and use breathing techniques as needed throughout daily activities.   Increase knowledge of respiratory medications and ability to use respiratory devices properly  Yes  Uses Albuterol MDI rarely, although carries in her purse. CPAP at 12cmH2O pressure, nasal mask, very compliant.   Intervention Provide education and demonstration as needed of appropriate use of medications, inhalers, and oxygen therapy.   Expected Outcomes Short Term: Achieves understanding of medications use. Understands that oxygen is a medication prescribed by physician. Demonstrates appropriate use of inhaler and oxygen therapy.   Hypertension Yes   Intervention Provide education on lifestyle modifcations including regular physical activity/exercise, weight management, moderate sodium restriction and increased consumption of fresh fruit, vegetables, and low fat dairy, alcohol moderation, and smoking cessation.;Monitor  prescription use compliance.   Expected Outcomes Short Term: Continued assessment and intervention until BP is < 140/22m HG in hypertensive participants. < 130/813mHG in hypertensive participants with diabetes, heart failure or chronic kidney disease.;Long Term: Maintenance of blood pressure at goal levels.   Lipids Yes   Intervention Provide education and support for participant on nutrition & aerobic/resistive exercise along with prescribed medications to achieve LDL <7026mHDL >93m66m Expected Outcomes Short Term: Participant states understanding of desired cholesterol values and is compliant with medications prescribed. Participant is following exercise prescription and nutrition guidelines.;Long Term: Cholesterol controlled with medications as prescribed, with individualized exercise RX and with personalized nutrition plan. Value goals: LDL < 70mg66mL > 40 mg.      Core Components/Risk Factors/Patient Goals Review:      Goals and Risk Factor Review    Row Name 07/06/16 1430             Core Components/Risk Factors/Patient Goals Review   Personal Goals Review Develop more efficient breathing techniques such as purse lipped breathing and diaphragmatic breathing and practicing self-pacing with activity.       Review PLB was discussed with patient as to how it can help control SOB during exercise and daily activities. The patient demonstrated understanding of this technique.        Expected Outcomes The patient will continue to use PLB during exercise class  as well as during daily activites to better control SOB and thus be able to increase strength and stamina.           Core Components/Risk Factors/Patient Goals at Discharge (Final Review):      Goals and Risk Factor Review - 07/06/16 1430      Core Components/Risk Factors/Patient Goals Review   Personal Goals Review Develop more efficient breathing techniques such as purse lipped breathing and diaphragmatic breathing and  practicing self-pacing with activity.   Review PLB was discussed with patient as to how it can help control SOB during exercise and daily activities. The patient demonstrated understanding of this technique.    Expected Outcomes The patient will continue to use PLB during exercise class as well as during daily activites to better control SOB and thus be able to increase strength and stamina.       ITP Comments:     ITP Comments    Row Name 07/06/16 1441 07/08/16 0915 07/20/16 1515 08/18/16 1436 09/15/16 1451   ITP Comments The patient did experience chest pain of 6.5-7 out of 10 while walking on the TM. When workloads were decreased the pain improved, and when the TM was stopped the pain was completely resolved. She did not exerperience these symptoms on the other machines. TM walking speed will be decreased to below her pain threshold. She is in close contact with her doctor about these symptoms and has an appointment with a cardiologist this month.  Amy Pardo called to inform us that her right foot did swell after class. Her physician made an appointment for her, received a cortizone shot, and needs to rest her foot 8-10days. Amy Bieker was cleared by Dr Chase Caller to return to [facility], but has now developed some plantar fasciitis and will miss this week of exercise. Called to check on status of return to rehab.  Feeling like she is ready to come back from the plantar fascitis stand point.  She is going to beach for week.  Her husband needs back surgery.  She hopes to the second week of October. Amy Stockburger was called - she last attended 07/06/16. She plans to return to [facility] on 09/21/16.   Row Name 09/24/16 1154           ITP Comments Torey has not attended since 07/06/16 due to plantar fascitis.          Comments: 30 Day Review

## 2016-09-30 ENCOUNTER — Encounter: Payer: Self-pay | Admitting: Internal Medicine

## 2016-10-01 NOTE — Telephone Encounter (Signed)
Weight loss through low carb diet or atkins diet or paleo but with MD advice (I do not do diet) + maybe swimming  Dr. Brand Males, M.D., [facility].C.P Pulmonary and Critical Care Medicine Staff Physician [facility] Pager: [phone], If no answer or between  15:00h - 7:00h: call [phone]  10/01/2016 11:48 AM

## 2016-10-01 NOTE — Telephone Encounter (Signed)
Below is an email from patient:  I have still not gone back to [employer]... my right ankle still bothers me (it is my driving ankle.) I am afraid the exercise will cause my plantar fasciitis to flare up again.     Is there anything else that Dr. Chase Caller would recommend me to do?    I still have the SOB at times.....    Thank you, .Amy Vang   MR please advise.  Thanks!

## 2016-10-13 ENCOUNTER — Telehealth: Payer: Self-pay | Admitting: Respiratory Therapy

## 2016-10-13 ENCOUNTER — Encounter: Payer: Self-pay | Admitting: Respiratory Therapy

## 2016-10-13 DIAGNOSIS — G4733 Obstructive sleep apnea (adult) (pediatric): Secondary | ICD-10-CM

## 2016-10-13 DIAGNOSIS — I2721 Secondary pulmonary arterial hypertension: Secondary | ICD-10-CM

## 2016-10-13 NOTE — Progress Notes (Signed)
Pulmonary Individual Treatment Plan  Patient Details  Name: Amy Vang MRN: [id number] Date of Birth: 1946/04/18 Referring Provider:   Flowsheet Row Pulmonary Rehab from 06/23/2016 in [facility] Cardiac and Pulmonary Rehab  Referring Provider  Ramaswamy      Initial Encounter Date:  Flowsheet Row Pulmonary Rehab from 06/23/2016 in [facility] Cardiac and Pulmonary Rehab  Date  06/23/16  Referring Provider  Ramaswamy      Visit Diagnosis: PAH (pulmonary artery hypertension)  Obstructive sleep apnea syndrome  Patient's Home Medications on Admission:  Current Outpatient Prescriptions:    acetaminophen (TYLENOL) 500 MG tablet, Take 1,000 mg by mouth every 6 (six) hours as needed for moderate pain., Disp: , Rfl:    aspirin EC 81 MG tablet, Take 81 mg by mouth daily., Disp: , Rfl:    Calcium Carbonate-Vitamin D (CALCIUM PLUS VITAMIN D PO), Take 1 tablet by mouth daily., Disp: , Rfl:    citalopram (CELEXA) 20 MG tablet, 10 mg oral daily, Disp: , Rfl: 2   clonazePAM (KLONOPIN) 1 MG tablet, Take 0.5 mg by mouth at bedtime. , Disp: , Rfl:    esomeprazole (NEXIUM) 40 MG capsule, Take 40 mg by mouth 2 (two) times daily., Disp: , Rfl:    fexofenadine (ALLEGRA) 60 MG tablet, Take 60 mg by mouth daily., Disp: , Rfl:    hydrochlorothiazide (HYDRODIURIL) 25 MG tablet, Take 25 mg by mouth daily., Disp: , Rfl:    montelukast (SINGULAIR) 10 MG tablet, Take 10 mg by mouth at bedtime., Disp: , Rfl:    polyethylene glycol (MIRALAX / GLYCOLAX) packet, Take 17 g by mouth daily as needed for mild constipation. , Disp: , Rfl:    potassium chloride (MICRO-K) 10 MEQ CR capsule, Take 10 mEq by mouth daily., Disp: , Rfl:    ranitidine (ZANTAC) 300 MG tablet, TAKE 1 TABLET DAILY AS DIRECTED FOR REFLUX (Patient taking differently: take 300 mg oral at bedtime), Disp: 90 tablet, Rfl: 0   rOPINIRole (REQUIP) 2 MG tablet, Take 2 mg by mouth at bedtime. , Disp: , Rfl:    zolpidem (AMBIEN) 5 MG tablet, Take 5  mg by mouth at bedtime. , Disp: , Rfl:   Current Facility-Administered Medications:    triamcinolone acetonide (KENALOG) 10 MG/ML injection 10 mg, 10 mg, Other, Once, Amy Vang, DPM  Past Medical History: Past Medical History:  Diagnosis Date   Acid reflux    Actinic keratosis    Precancerous   Allergic rhinitis    Anxiety    Asthma    CVA (cerebral infarction)    Cystocele    Degenerative disc disease, cervical    Diastolic dysfunction    GERD (gastroesophageal reflux disease) PT STATES SEVERE---- CONTROLLED W/ NEXIUM   History of TIAs AGE 60  AND 2008---  NO RESIDUAL   Hypertension    Non-specific gastrointestinal complaint    OSA (obstructive sleep apnea)    Severe CPAP 12 MM H2O- Dr Radford Pax   Pulmonary HTN    Moderate with PASP 75mHg by echo 03/2016   Restless leg syndrome    Rosacea    SUI (stress urinary incontinence, female)     Tobacco Use: History  Smoking Status   Never Smoker  Smokeless Tobacco   Never Used    Labs: Recent Review Flowsheet Data    Labs for ITP Cardiac and Pulmonary Rehab Latest Ref Rng & Units 04/11/2012 04/12/2012 06/26/2016 08/13/2016 08/13/2016   Cholestrol 0 - 200 mg/dL - 142 - - -  LDLCALC 0 - 99 mg/dL - 75 - - -   HDL >39 mg/dL - 29(L) - - -   Trlycerides <150 mg/dL - 192(H) - - -   Hemoglobin A1c <5.7 % - 6.0(H) - - -   HCO3 20.0 - 28.0 mmol/L - - - 30.6(H) 28.5(H)   TCO2 0 - 100 mmol/L 27 - 25 32 30   O2SAT % - - - 63.0 64.0       ADL UCSD:     Pulmonary Assessment Scores    Row Name 06/23/16 1213         ADL UCSD   ADL Phase Entry     SOB Score total 67     Rest 0     Walk 1     Stairs 5     Bath 0     Dress 0     Shop 5        Pulmonary Function Assessment:     Pulmonary Function Assessment - 06/23/16 1211      Pulmonary Function Tests   FVC% 89 %   FEV1% 94 %   FEV1/FVC Ratio 80   DLCO% 64 %     Initial Spirometry Results   Comments Test date: 04/16/16     Breath    Bilateral Breath Sounds Clear   Shortness of Breath Yes      Exercise Target Goals:    Exercise Program Goal: Individual exercise prescription set with THRR, safety & activity barriers. Participant demonstrates ability to understand and report RPE using BORG scale, to self-measure pulse accurately, and to acknowledge the importance of the exercise prescription.  Exercise Prescription Goal: Starting with aerobic activity 30 plus minutes a day, 3 days per week for initial exercise prescription. Provide home exercise prescription and guidelines that participant acknowledges understanding prior to discharge.  Activity Barriers & Risk Stratification:     Activity Barriers & Cardiac Risk Stratification - 06/23/16 1210      Activity Barriers & Cardiac Risk Stratification   Activity Barriers Deconditioning;Shortness of Breath   Cardiac Risk Stratification Moderate      6 Minute Walk:     6 Minute Walk    Row Name 06/23/16 1536         6 Minute Walk   Distance 1352.5 feet     Walk Time 6 minutes     # of Rest Breaks 0     MPH 2.56     METS 3.19     RPE 12     Perceived Dyspnea  3     VO2 Peak 11.17     Symptoms No        Initial Exercise Prescription:     Initial Exercise Prescription - 06/23/16 1500      Date of Initial Exercise RX and Referring Provider   Date 06/23/16   Referring Provider Ramaswamy     Treadmill   MPH 2.5   Grade 0.5   Minutes 15   METs 3.09     NuStep   Level 2   Watts 50   Minutes 15   METs 3     REL-XR   Level 1   Watts 50   Minutes 15   METs 3     Prescription Details   Frequency (times per week) 3   Duration Progress to 45 minutes of aerobic exercise without signs/symptoms of physical distress     Intensity   THRR 40-80% of Max Heartrate 102-135  Ratings of Perceived Exertion 11-15   Perceived Dyspnea 0-4     Progression   Progression Continue to progress workloads to maintain intensity without signs/symptoms of  physical distress.     Resistance Training   Training Prescription Yes   Weight 2   Reps 10-15      Perform Capillary Blood Glucose checks as needed.  Exercise Prescription Changes:     Exercise Prescription Changes    Row Name 07/06/16 1400             Response to Exercise   Blood Pressure (Admit) 160/80       Blood Pressure (Exercise) 166/70       Blood Pressure (Exit) 134/76       Heart Rate (Admit) 72 bpm       Heart Rate (Exercise) 103 bpm       Heart Rate (Exit) 75 bpm       Oxygen Saturation (Admit) 97 %       Oxygen Saturation (Exercise) 94 %       Oxygen Saturation (Exit) 95 %       Rating of Perceived Exertion (Exercise) 13       Perceived Dyspnea (Exercise) 3.5       Duration Progress to 30 minutes of continuous aerobic without signs/symptoms of physical distress       Intensity THRR unchanged  102-135         Progression   Progression Continue progressive overload as per policy without signs/symptoms or physical distress.         Resistance Training   Training Prescription Yes       Weight 2       Reps 10-15         Treadmill   MPH 2       Grade 0       Minutes 15       METs 2.5         NuStep   Level 2       Watts 50       Minutes 15       METs 3         REL-XR   Level 1       Watts 50       Minutes 15       METs 3          Exercise Comments:     Exercise Comments    Row Name 07/06/16 1441 07/15/16 1426 07/29/16 1543 08/11/16 1542 09/24/16 1153   Exercise Comments  First full day of exercise!  Patient was oriented to gym and equipment including functions, settings, policies, and procedures.  Patient's individual exercise prescription and treatment plan were reviewed.  All starting workloads were established based on the results of the 6 minute walk test done at initial orientation visit.  The plan for exercise progression was also introduced and progression will be customized based on patient's performance and goals. Pt has been out  with swollen foot since first visit. Pt continues to stay out due to plantar facisitis. Pt has not attended since last review. Aveleen has not attended since 07/06/16 due to plantar fascitis.      Discharge Exercise Prescription (Final Exercise Prescription Changes):     Exercise Prescription Changes - 07/06/16 1400      Response to Exercise   Blood Pressure (Admit) 160/80   Blood Pressure (Exercise) 166/70   Blood Pressure (Exit) 134/76   Heart Rate (  Admit) 72 bpm   Heart Rate (Exercise) 103 bpm   Heart Rate (Exit) 75 bpm   Oxygen Saturation (Admit) 97 %   Oxygen Saturation (Exercise) 94 %   Oxygen Saturation (Exit) 95 %   Rating of Perceived Exertion (Exercise) 13   Perceived Dyspnea (Exercise) 3.5   Duration Progress to 30 minutes of continuous aerobic without signs/symptoms of physical distress   Intensity THRR unchanged  102-135     Progression   Progression Continue progressive overload as per policy without signs/symptoms or physical distress.     Resistance Training   Training Prescription Yes   Weight 2   Reps 10-15     Treadmill   MPH 2   Grade 0   Minutes 15   METs 2.5     NuStep   Level 2   Watts 50   Minutes 15   METs 3     REL-XR   Level 1   Watts 50   Minutes 15   METs 3       Nutrition:  Target Goals: Understanding of nutrition guidelines, daily intake of sodium <1521m, cholesterol <20100m calories 30% from fat and 7% or less from saturated fats, daily to have 5 or more servings of fruits and vegetables.  Biometrics:     Pre Biometrics - 06/23/16 1535      Pre Biometrics   Height 5' 2.25" (1.581 m)   Weight 190 lb 12.8 oz (86.5 kg)   Waist Circumference 40.25 inches   Hip Circumference 44.5 inches   Waist to Hip Ratio 0.9 %   BMI (Calculated) 34.7       Nutrition Therapy Plan and Nutrition Goals:     Nutrition Therapy & Goals - 06/23/16 1218      Nutrition Therapy   Diet Ms MuErnestina Columbiaould like to meet with the dietitian. Her  portion sizes are moderate amounts; moderate amount of salt; very few sweets; and she drinks 40+ ounces of water.      Nutrition Discharge: Rate Your Plate Scores:   Psychosocial: Target Goals: Acknowledge presence or absence of depression, maximize coping skills, provide positive support system. Participant is able to verbalize types and ability to use techniques and skills needed for reducing stress and depression.  Initial Review & Psychosocial Screening:     Initial Psych Review & Screening - 06/23/16 1228      Family Dynamics   Good Support System? Yes   Comments Ms MuSprangeroes have good family support from her 2 children and husband. She does have trouble sleeping, although she is very compliant with her CPAP. She  has some anxiousness about her PHA diagnosis and is interested in learning more about this diagnosis.  iagnosis and lac     Barriers   Psychosocial barriers to participate in program The patient should benefit from training in stress management and relaxation.     Screening Interventions   Interventions Encouraged to exercise;Program counselor consult      Quality of Life Scores:     Quality of Life - 06/23/16 1232      Quality of Life Scores   Health/Function Pre 21 %   Socioeconomic Pre 20.88 %   Psych/Spiritual Pre 21 %   Family Pre 21 %   GLOBAL Pre 20.81 %      PHQ-9: Recent Review Flowsheet Data    [facility]   Decreased Interest 1   Down, Depressed, Hopeless 1   PHQ - 2  Score 2   Altered sleeping 3   Tired, decreased energy 2   Change in appetite 0   Feeling bad or failure about yourself  0   Trouble concentrating 0   Moving slowly or fidgety/restless 0   Suicidal thoughts 0   PHQ-9 Score 7   Difficult doing work/chores Somewhat difficult      Psychosocial Evaluation and Intervention:     Psychosocial Evaluation - 07/06/16 1021      Psychosocial Evaluation & Interventions   Comments Counselor met with  Ms. Whiteaker (Ms. Jerilynn Mages) today for initial psychosocial evaluation.  She is a 70 year old female who has some pulmonary and possible cardiology issues and is seeing a Dr. to confirm her Dx later this month.   She has a strong support system with a spouse of 47 years and a son and daughter who live close by as well as active involvement in her local church community.  Ms. Jerilynn Mages states she had a TIA (1) week ago and has been released to attend this program by her Dr.  She also has sleep apnea and typically sleeps 6-7 hours per night with the help of medications prescribed, and her CPAP machine.  Ms. Jerilynn Mages reports having a history of depression/anxiety and is on medication for this currently that is managing the symptoms adequately.  She states she is typically in a positive mood and has minimal stress in her life other than some concern for her health and her spouse.  Ms. Jerilynn Mages has goals to improve her breathing; lose weight and decrease the number of medications she is taking while in this program.  Counselor will be following with Ms. M throughout the course of Tx.     Continued Psychosocial Services Needed Yes  Ms. M will benefit from the psychoeducational components of this program, as well as meeting with the dietician to address her weight loss goals.        Psychosocial Re-Evaluation:  Education: Education Goals: Education classes will be provided on a weekly basis, covering required topics. Participant will state understanding/return demonstration of topics presented.  Learning Barriers/Preferences:     Learning Barriers/Preferences - 06/23/16 1211      Learning Barriers/Preferences   Learning Barriers None   Learning Preferences Group Instruction;Individual Instruction;Pictoral;Skilled Demonstration;Verbal Instruction;Video;Written Material      Education Topics: Initial Evaluation Education: - Verbal, written and demonstration of respiratory meds, RPE/PD scales, oximetry and breathing techniques.  Instruction on use of nebulizers and MDIs: cleaning and proper use, rinsing mouth with steroid doses and importance of monitoring MDI activations. Flowsheet Row Pulmonary Rehab from 07/06/2016 in [facility] Cardiac and Pulmonary Rehab  Date  06/23/16  Educator  LB  Instruction Review Code  2- meets goals/outcomes      General Nutrition Guidelines/Fats and Fiber: -Group instruction provided by verbal, written material, models and posters to present the general guidelines for heart healthy nutrition. Gives an explanation and review of dietary fats and fiber.   Controlling Sodium/Reading Food Labels: -Group verbal and written material supporting the discussion of sodium use in heart healthy nutrition. Review and explanation with models, verbal and written materials for utilization of the food label.   Exercise Physiology & Risk Factors: - Group verbal and written instruction with models to review the exercise physiology of the cardiovascular system and associated critical values. Details cardiovascular disease risk factors and the goals associated with each risk factor.   Aerobic Exercise & Resistance Training: - Gives group verbal and written discussion on  the health impact of inactivity. On the components of aerobic and resistive training programs and the benefits of this training and how to safely progress through these programs.   Flexibility, Balance, General Exercise Guidelines: - Provides group verbal and written instruction on the benefits of flexibility and balance training programs. Provides general exercise guidelines with specific guidelines to those with heart or lung disease. Demonstration and skill practice provided.   Stress Management: - Provides group verbal and written instruction about the health risks of elevated stress, cause of high stress, and healthy ways to reduce stress.   Depression: - Provides group verbal and written instruction on the correlation between  heart/lung disease and depressed mood, treatment options, and the stigmas associated with seeking treatment.   Exercise & Equipment Safety: - Individual verbal instruction and demonstration of equipment use and safety with use of the equipment. Flowsheet Row Pulmonary Rehab from 07/06/2016 in [facility] Cardiac and Pulmonary Rehab  Date  07/06/16  Educator  [facility]  Instruction Review Code  2- meets goals/outcomes      Infection Prevention: - Provides verbal and written material to individual with discussion of infection control including proper hand washing and proper equipment cleaning during exercise session. Flowsheet Row Pulmonary Rehab from 07/06/2016 in [facility] Cardiac and Pulmonary Rehab  Date  07/06/16  Educator  [facility]  Instruction Review Code  2- meets goals/outcomes      Falls Prevention: - Provides verbal and written material to individual with discussion of falls prevention and safety. Flowsheet Row Pulmonary Rehab from 07/06/2016 in [facility] Cardiac and Pulmonary Rehab  Date  06/23/16  Educator  LB  Instruction Review Code  2- meets goals/outcomes      Diabetes: - Individual verbal and written instruction to review signs/symptoms of diabetes, desired ranges of glucose level fasting, after meals and with exercise. Advice that pre and post exercise glucose checks will be done for 3 sessions at entry of program.   Chronic Lung Diseases: - Group verbal and written instruction to review new updates, new respiratory medications, new advancements in procedures and treatments. Provide informative websites and "800" numbers of self-education.   Lung Procedures: - Group verbal and written instruction to describe testing methods done to diagnose lung disease. Review the outcome of test results. Describe the treatment choices: Pulmonary Function Tests, ABGs and oximetry.   Energy Conservation: - Provide group verbal and written instruction for methods to conserve energy, plan and organize  activities. Instruct on pacing techniques, use of adaptive equipment and posture/positioning to relieve shortness of breath.   Triggers: - Group verbal and written instruction to review types of environmental controls: home humidity, furnaces, filters, dust mite/pet prevention, HEPA vacuums. To discuss weather changes, air quality and the benefits of nasal washing.   Exacerbations: - Group verbal and written instruction to provide: warning signs, infection symptoms, calling MD promptly, preventive modes, and value of vaccinations. Review: effective airway clearance, coughing and/or vibration techniques. Create an Sports administrator.   Oxygen: - Individual and group verbal and written instruction on oxygen therapy. Includes supplement oxygen, available portable oxygen systems, continuous and intermittent flow rates, oxygen safety, concentrators, and Medicare reimbursement for oxygen.   Respiratory Medications: - Group verbal and written instruction to review medications for lung disease. Drug class, frequency, complications, importance of spacers, rinsing mouth after steroid MDI's, and proper cleaning methods for nebulizers. Flowsheet Row Pulmonary Rehab from 07/06/2016 in [facility] Cardiac and Pulmonary Rehab  Date  06/23/16  Educator  LB  Instruction Review Code  2- meets goals/outcomes      AED/CPR: - Group verbal and written instruction with the use of models to demonstrate the basic use of the AED with the basic ABC's of resuscitation.   Breathing Retraining: - Provides individuals verbal and written instruction on purpose, frequency, and proper technique of diaphragmatic breathing and pursed-lipped breathing. Applies individual practice skills. Flowsheet Row Pulmonary Rehab from 07/06/2016 in [facility] Cardiac and Pulmonary Rehab  Date  07/06/16  Educator  [facility]  Instruction Review Code  2- meets goals/outcomes      Anatomy and Physiology of the Lungs: - Group verbal and written instruction with  the use of models to provide basic lung anatomy and physiology related to function, structure and complications of lung disease.   Heart Failure: - Group verbal and written instruction on the basics of heart failure: signs/symptoms, treatments, explanation of ejection fraction, enlarged heart and cardiomyopathy.   Sleep Apnea: - Individual verbal and written instruction to review Obstructive Sleep Apnea. Review of risk factors, methods for diagnosing and types of masks and machines for OSA. Flowsheet Row Pulmonary Rehab from 07/06/2016 in [facility] Cardiac and Pulmonary Rehab  Date  06/23/16  Educator  LB  Instruction Review Code  2- meets goals/outcomes      Anxiety: - Provides group, verbal and written instruction on the correlation between heart/lung disease and anxiety, treatment options, and management of anxiety.   Relaxation: - Provides group, verbal and written instruction about the benefits of relaxation for patients with heart/lung disease. Also provides patients with examples of relaxation techniques.   Knowledge Questionnaire Score:     Knowledge Questionnaire Score - 06/23/16 1211      Knowledge Questionnaire Score   Pre Score 8/10       Core Components/Risk Factors/Patient Goals at Admission:     Personal Goals and Risk Factors at Admission - 06/23/16 1223      Core Components/Risk Factors/Patient Goals on Admission    Weight Management Yes;Weight Loss  Ms Ernestina Columbia would like to meet with the dietitian. Her portion sizes are moderate amounts; moderate amount of salt; very few sweets; and she drinks 40+ ounces of water   Intervention Weight Management: Develop a combined nutrition and exercise program designed to reach desired caloric intake, while maintaining appropriate intake of nutrient and fiber, sodium and fats, and appropriate energy expenditure required for the weight goal.;Weight Management: Provide education and appropriate resources to help participant work on  and attain dietary goals.;Weight Management/Obesity: Establish reasonable short term and long term weight goals.;Obesity: Provide education and appropriate resources to help participant work on and attain dietary goals.   Admit Weight 190 lb 12.8 oz (86.5 kg)   Goal Weight: Short Term 170 lb (77.1 kg)   Goal Weight: Long Term 160 lb (72.6 kg)   Expected Outcomes Short Term: Continue to assess and modify interventions until short term weight is achieved;Long Term: Adherence to nutrition and physical activity/exercise program aimed toward attainment of established weight goal;Weight Loss: Understanding of general recommendations for a balanced deficit meal plan, which promotes 1-2 lb weight loss per week and includes a negative energy balance of [phone] kcal/d;Understanding recommendations for meals to include 15-35% energy as protein, 25-35% energy from fat, 35-60% energy from carbohydrates, less than 243m of dietary cholesterol, 20-35 gm of total fiber daily;Understanding of distribution of calorie intake throughout the day with the consumption of 4-5 meals/snacks   Sedentary Yes  Member of [employer]   Intervention Provide advice, education, support and counseling about  physical activity/exercise needs.;Develop an individualized exercise prescription for aerobic and resistive training based on initial evaluation findings, risk stratification, comorbidities and participant's personal goals.   Expected Outcomes Achievement of increased cardiorespiratory fitness and enhanced flexibility, muscular endurance and strength shown through measurements of functional capacity and personal statement of participant.   Increase Strength and Stamina Yes   Intervention Provide advice, education, support and counseling about physical activity/exercise needs.;Develop an individualized exercise prescription for aerobic and resistive training based on initial evaluation findings, risk stratification, comorbidities and  participant's personal goals.   Expected Outcomes Achievement of increased cardiorespiratory fitness and enhanced flexibility, muscular endurance and strength shown through measurements of functional capacity and personal statement of participant.   Improve shortness of breath with ADL's Yes   Intervention Provide education, individualized exercise plan and daily activity instruction to help decrease symptoms of SOB with activities of daily living.   Expected Outcomes Short Term: Achieves a reduction of symptoms when performing activities of daily living.   Develop more efficient breathing techniques such as purse lipped breathing and diaphragmatic breathing; and practicing self-pacing with activity Yes   Intervention Provide education, demonstration and support about specific breathing techniuqes utilized for more efficient breathing. Include techniques such as pursed lipped breathing, diaphragmatic breathing and self-pacing activity.   Expected Outcomes Short Term: Participant will be able to demonstrate and use breathing techniques as needed throughout daily activities.   Increase knowledge of respiratory medications and ability to use respiratory devices properly  Yes  Uses Albuterol MDI rarely, although carries in her purse. CPAP at 12cmH2O pressure, nasal mask, very compliant.   Intervention Provide education and demonstration as needed of appropriate use of medications, inhalers, and oxygen therapy.   Expected Outcomes Short Term: Achieves understanding of medications use. Understands that oxygen is a medication prescribed by physician. Demonstrates appropriate use of inhaler and oxygen therapy.   Hypertension Yes   Intervention Provide education on lifestyle modifcations including regular physical activity/exercise, weight management, moderate sodium restriction and increased consumption of fresh fruit, vegetables, and low fat dairy, alcohol moderation, and smoking cessation.;Monitor  prescription use compliance.   Expected Outcomes Short Term: Continued assessment and intervention until BP is < 140/63m HG in hypertensive participants. < 130/863mHG in hypertensive participants with diabetes, heart failure or chronic kidney disease.;Long Term: Maintenance of blood pressure at goal levels.   Lipids Yes   Intervention Provide education and support for participant on nutrition & aerobic/resistive exercise along with prescribed medications to achieve LDL <7098mHDL >63m39m Expected Outcomes Short Term: Participant states understanding of desired cholesterol values and is compliant with medications prescribed. Participant is following exercise prescription and nutrition guidelines.;Long Term: Cholesterol controlled with medications as prescribed, with individualized exercise RX and with personalized nutrition plan. Value goals: LDL < 70mg41mL > 40 mg.      Core Components/Risk Factors/Patient Goals Review:      Goals and Risk Factor Review    Row Name 07/06/16 1430             Core Components/Risk Factors/Patient Goals Review   Personal Goals Review Develop more efficient breathing techniques such as purse lipped breathing and diaphragmatic breathing and practicing self-pacing with activity.       Review PLB was discussed with patient as to how it can help control SOB during exercise and daily activities. The patient demonstrated understanding of this technique.        Expected Outcomes The patient will continue to use PLB during exercise class  as well as during daily activites to better control SOB and thus be able to increase strength and stamina.           Core Components/Risk Factors/Patient Goals at Discharge (Final Review):      Goals and Risk Factor Review - 07/06/16 1430      Core Components/Risk Factors/Patient Goals Review   Personal Goals Review Develop more efficient breathing techniques such as purse lipped breathing and diaphragmatic breathing and  practicing self-pacing with activity.   Review PLB was discussed with patient as to how it can help control SOB during exercise and daily activities. The patient demonstrated understanding of this technique.    Expected Outcomes The patient will continue to use PLB during exercise class as well as during daily activites to better control SOB and thus be able to increase strength and stamina.       ITP Comments:     ITP Comments    Row Name 07/06/16 1441 07/08/16 0915 07/20/16 1515 08/18/16 1436 09/15/16 1451   ITP Comments The patient did experience chest pain of 6.5-7 out of 10 while walking on the TM. When workloads were decreased the pain improved, and when the TM was stopped the pain was completely resolved. She did not exerperience these symptoms on the other machines. TM walking speed will be decreased to below her pain threshold. She is in close contact with her doctor about these symptoms and has an appointment with a cardiologist this month.  Ms Belgard called to inform us that her right foot did swell after class. Her physician made an appointment for her, received a cortizone shot, and needs to rest her foot 8-10days. Ms Goto was cleared by Dr Chase Caller to return to [facility], but has now developed some plantar fasciitis and will miss this week of exercise. Called to check on status of return to rehab.  Feeling like she is ready to come back from the plantar fascitis stand point.  She is going to beach for week.  Her husband needs back surgery.  She hopes to the second week of October. Ms Kolasa was called - she last attended 07/06/16. She plans to return to [facility] on 09/21/16.   [place] Name 09/24/16 1154 10/13/16 1127         ITP Comments Aritzel has not attended since 07/06/16 due to plantar fascitis. Called Ms Egolf - last attended 07/06/16. She has many family and health issues and can not attend [facility] at this time. We discussed participation next year when she can commit to  regular attendence.         Comments: Called Ms Bhattacharyya - last attended 07/06/16. She has many family and health issues and can not attend [facility] at this time. We discussed participation next year when she can commit to regular attendence.

## 2016-10-19 ENCOUNTER — Other Ambulatory Visit: Payer: Self-pay | Admitting: Family Medicine

## 2016-10-19 ENCOUNTER — Encounter: Payer: Self-pay | Admitting: Cardiology

## 2016-10-19 DIAGNOSIS — Z1231 Encounter for screening mammogram for malignant neoplasm of breast: Secondary | ICD-10-CM

## 2016-11-02 ENCOUNTER — Encounter: Payer: Self-pay | Admitting: Internal Medicine

## 2016-11-02 ENCOUNTER — Ambulatory Visit (INDEPENDENT_AMBULATORY_CARE_PROVIDER_SITE_OTHER): Payer: Medicare PPO | Admitting: Internal Medicine

## 2016-11-02 VITALS — BP 152/80 | HR 72 | Ht 65.0 in | Wt 191.4 lb

## 2016-11-02 DIAGNOSIS — R0689 Other abnormalities of breathing: Secondary | ICD-10-CM

## 2016-11-02 DIAGNOSIS — J387 Other diseases of larynx: Secondary | ICD-10-CM | POA: Diagnosis not present

## 2016-11-02 DIAGNOSIS — R6889 Other general symptoms and signs: Secondary | ICD-10-CM

## 2016-11-02 DIAGNOSIS — R0989 Other specified symptoms and signs involving the circulatory and respiratory systems: Secondary | ICD-10-CM

## 2016-11-02 DIAGNOSIS — R0600 Dyspnea, unspecified: Secondary | ICD-10-CM

## 2016-11-02 NOTE — Patient Instructions (Addendum)
1. Dyspnea and respiratory abnormality - cpst test suggested weight as reason for shortness of breath  - too bad you could not do rehab due to plantar fascitis  - let us wait till march2018 before re-referring you to rehab (due to sinus surgery)   3. 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

## 2016-11-02 NOTE — Progress Notes (Signed)
Subjective:     Patient ID: Amy Vang, female   DOB: 07-May-1946, 70 y.o.   MRN: [id number]  HPI     PC Reginia Naas, MD Referred by Dr [name] of cardiology  HPI  IOV 05/21/2016  Chief Complaint  Patient presents with  . Pulmonary Consult    Ref. by Dr. Sabino Snipes. PFT last mth.@[facility] .Sob with exertion x 1 yr. Humidity makes worse, wheezing occass.,dry cough occass., clears throat a lot, hoarseness,denies cp or tightness.Wears CPAP each night doing well.     70 year old female accompanied by her husband. Referred by Dr. Radford Pax for shortness of breath and wheezing evaluation. History obtained from patient, husband and review of the chart  She has chronic throat clearing. Last 4 years. Also for the last 4 years she's been on CPAP for obstructive sleep apnea which has been helping her. Reports that in 2015/2016 her husband had pneumonia and at the same time she had acute bronchitis. After that she's had chronic wheezing for which she was on Qvar and has a diagnosis of asthma. Is being treated by Dr. Neldon Mc at allergy clinic. However the Qvar did not help her. In addition it made her have diaphoresis saw a few months ago she stopped it and started taking Symbicort which also did not help and now for the last 8 days she is not taking any inhaler. Throughout this time her chronic throat clearing is unchanged and persistent and moderate. Her main symptoms in the last year and half since the diagnosis of asthma is one of dyspnea and wheezing. Her dyspnea is of onset since the restaurant illness. It is present on exertion such as walking 3 laps in our office or climbing stairs. It is relieved by rest. The inhalers are not helping her. It is associated with wheezing. Pollen also makes a wheeze but otherwise no exacerbating factors. Albuterol helps. Symptoms and she takes it at least twice a week. There are no nocturnal symptoms no nocturnal awakening. No orthopnea or paroxysmal  nocturnal dyspnea.  There is concern that she has elevated pulmonary artery systolic pressure and diastolic dysfunction and is documented in a series of tests below. She's now been referred to pulmonary because of low diffusion capacity and elevated pulmonary artery systolic pressure on echo    Laboratory data so far  Echocardiogram 03/31/2016 left ventricular hypertrophy with grade 1 diastolic dysfunction and elevated pulmonary artery systolic pressure 46 mmHg and increased compared to one year ago-  - creatinine 0.66 mg 04/22/2016 normal - Hemoglobin 12.8 g percent 04/22/2016 - Sedimentation rate of 1 04/22/2016 and normal ANA and rheumatoid factor -  normal BNP 04/22/2016 - CT angiogram chest 04/14/2016 no evidence of pulmonary embolism and normal lung parenchyma personally visualized this test  - Pulmonary function test 04/16/2016 normal but with slightly reduced effusion capacity of 16.5/64% - Nuclear medicine myocardial perfusion test 04/30/2016 normal with an ejection fraction of 71%   - Walking desaturation test 185 feet 3 laps on room air in the office 05/21/2016 : Got dyspneic and the third lap but did not desaturate below 94% (started off at 94%). Restng HR 60 and pk HR 103/min  - Exhaled NO 05/21/2016 - 13 ppb (off symbicoxrt x 8 days)     7/12/2017Follow Up for CPST  Pt. Presents for results of her CPST.I explained that the study was essentially normal.We discussed that her dyspnea is most likely related to her weight and deconditioning. She states since she and her husband were sick  this fall she has not exercised much.We discussed the option of Pulmonary Rehab. She likes the idea of exercising in a controlled environment with medical staff to monitor. We discussed her cough and possible interventions to assist in its management. She states she is compliant with her CPAP every night. She denies chest pain, fever, purulent secretions, orthopnea or hemoptysis.   Tests CPST  05/26/2016  Conclusion: Exercise testing with gas-exchange demonstrates normal functional capacity when compared to matched sedentary norms. There is no evidence of cardiopulmonary limitation or exercise-induced bronchospasm. Patient appears primarily limited due to her body habitus. 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  Resting HR: 72 Peak HR: 121  (80% age predicted max HR)  Pre-Exercise PFTs   FVC 2.76 (91%)     FEV1 2.07 (89%)      FEV1/FVC 75 (96%)      MVV 80 (89%)  Post-Exercise PFTs ((from lowest post-exercise trial (%change from rest)) FVC performed IPE, 5, 10, 15 mins  FVC 2.71 (-1%) 15 mins     FEV1 2.02 (-2%) 15 mins      FEV1/FVC 64 (-15%) 5 mins      BP rest: 148/82 BP peak: 170/66      OV 11/02/2016  Chief Complaint  Patient presents with  . Follow-up    Pt states her SOB has improved since last OV. Pt c/o dry cough. Pt denies CP/tightness.    Fu dyspnea: CPTS was normal but suggested obestity, and chronotropic incompetence. REhab recommended but she could not do it due plantar fascitis  Fu cough: she has had this for 20years. On and off. Mostly throat clearing and larynbgeal quality of voice. Today she tells me she has bad chronic sinus disease and Dr Wilburn Cornelia will oiperate end of this month. Cough wakes her up at night. Nasal passages always congested    has a past medical history of Acid reflux; Actinic keratosis; Allergic rhinitis; Anxiety; Asthma; CVA (cerebral infarction); Cystocele; Degenerative disc disease, cervical; Diastolic dysfunction; GERD (gastroesophageal reflux disease) (PT STATES SEVERE---- CONTROLLED W/ NEXIUM); History of TIAs (AGE 57  AND 2008---  NO RESIDUAL); Hypertension; Non-specific gastrointestinal complaint; OSA (obstructive sleep apnea); Pulmonary HTN; Restless leg syndrome; Rosacea; and SUI (stress urinary incontinence, female).   reports that she has never smoked. She has never used smokeless  tobacco.  Past Surgical History:  Procedure Laterality Date  . BILATERAL BENIGN BREAST BX'S  20 YRS AGO  . CARDIAC CATHETERIZATION N/A 08/13/2016   Procedure: Right Heart Cath;  Surgeon: Larey Dresser, MD;  Location: [facility];  Service: Cardiovascular;  Laterality: N/A;  . CARPAL TUNNEL RELEASE  2009   BILATERAL  . CYSTOCELE REPAIR  12/11/2011   Procedure: ANTERIOR REPAIR (CYSTOCELE);  Surgeon: Claybon Jabs, MD;  Location: [facility];  Service: Urology;  Laterality: N/A;  1 1/2 hour requested for case  Anterior repair and mid Urethral Sling  . NONINVASIVE VASCULAR CAROTID STUDY  09-14-2007   BILATERAL MILD MIX PLAQUE THROUGHOUT, NO SIGNIFICANT BILATERAL ICA STENOSIS  . PUBOVAGINAL SLING  12/11/2011   Procedure: Gaynelle Arabian;  Surgeon: Claybon Jabs, MD;  Location: [facility];  Service: Urology;  Laterality: N/A;  . PULLEY RELEASE RIGHT THUMB  2009  . VAGINAL HYSTERECTOMY  AGE 83    Allergies  Allergen Reactions  . Ceftin [Cefuroxime Axetil]     Lack of Therapeutic Effect  . Hydroxyzine Hcl   . Penicillins Hives    Has patient had a PCN reaction  causing immediate rash, facial/tongue/throat swelling, SOB or lightheadedness with hypotension: Yes Has patient had a PCN reaction causing severe rash involving mucus membranes or skin necrosis: No Has patient had a PCN reaction that required hospitalization No Has patient had a PCN reaction occurring within the last 10 years: No If all of the above answers are "NO", then may proceed with Cephalosporin use.   [name] [Sulfamethoxazole-Trimethoprim] Rash    Immunization History  Administered Date(s) Administered  . Influenza Split 08/22/2015  . Influenza,inj,Quad PF,36+ Mos 08/30/2016  . Pneumococcal Conjugate-13 09/01/2016    Family History  Problem Relation Age of Onset  . Adopted: Yes  . Diabetes Mother   . Stroke Mother   . Alzheimer's disease Mother   . Hypertension Sister    . Diabetes Sister   . Heart attack Brother   . Leukemia Brother   . CAD Brother   . Diabetes Brother   . Colon cancer Maternal Aunt   . Stroke Paternal Grandmother   . Heart attack Paternal Grandfather      Current Outpatient Prescriptions:  .  acetaminophen (TYLENOL) 500 MG tablet, Take 1,000 mg by mouth every 6 (six) hours as needed for moderate pain., Disp: , Rfl:  .  Calcium Carbonate-Vitamin D (CALCIUM PLUS VITAMIN D PO), Take 1 tablet by mouth daily., Disp: , Rfl:  .  citalopram (CELEXA) 20 MG tablet, 10 mg oral daily, Disp: , Rfl: 2 .  clonazePAM (KLONOPIN) 1 MG tablet, Take 0.5 mg by mouth at bedtime. , Disp: , Rfl:  .  esomeprazole (NEXIUM) 40 MG capsule, Take 40 mg by mouth 2 (two) times daily., Disp: , Rfl:  .  fexofenadine (ALLEGRA) 60 MG tablet, Take 60 mg by mouth daily., Disp: , Rfl:  .  hydrochlorothiazide (HYDRODIURIL) 25 MG tablet, Take 25 mg by mouth daily., Disp: , Rfl:  .  montelukast (SINGULAIR) 10 MG tablet, Take 10 mg by mouth at bedtime., Disp: , Rfl:  .  polyethylene glycol (MIRALAX / GLYCOLAX) packet, Take 17 g by mouth daily as needed for mild constipation. , Disp: , Rfl:  .  potassium chloride (MICRO-K) 10 MEQ CR capsule, Take 10 mEq by mouth daily., Disp: , Rfl:  .  ranitidine (ZANTAC) 300 MG tablet, TAKE 1 TABLET DAILY AS DIRECTED FOR REFLUX (Patient taking differently: take 300 mg oral at bedtime), Disp: 90 tablet, Rfl: 0 .  rOPINIRole (REQUIP) 2 MG tablet, Take 3 mg by mouth at bedtime. , Disp: , Rfl:   Current Facility-Administered Medications:  .  triamcinolone acetonide (KENALOG) 10 MG/ML injection 10 mg, 10 mg, Other, Once, Trula Slade, DPM   Review of Systems     Objective:   Physical Exam  Constitutional: She is oriented to person, place, and time. She appears well-developed and well-nourished. No distress.  HENT:  Head: Normocephalic and atraumatic.  Right Ear: External ear normal.  Left Ear: External ear normal.  Mouth/Throat:  Oropharynx is clear and moist. No oropharyngeal exudate.  Iaryngeal quality of cough  Eyes: Conjunctivae and EOM are normal. Pupils are equal, round, and reactive to light. Right eye exhibits no discharge. Left eye exhibits no discharge. No scleral icterus.  Neck: Normal range of motion. Neck supple. No JVD present. No tracheal deviation present. No thyromegaly present.  Cardiovascular: Normal rate, regular rhythm, normal heart sounds and intact distal pulses.  Exam reveals no gallop and no friction rub.   No murmur heard. Pulmonary/Chest: Effort normal and breath sounds normal. No respiratory distress.  She has no wheezes. She has no rales. She exhibits no tenderness.  Abdominal: Soft. Bowel sounds are normal. She exhibits no distension and no mass. There is no tenderness. There is no rebound and no guarding.  Musculoskeletal: Normal range of motion. She exhibits no edema or tenderness.  Lymphadenopathy:    She has no cervical adenopathy.  Neurological: She is alert and oriented to person, place, and time. She has normal reflexes. No cranial nerve deficit. She exhibits normal muscle tone. Coordination normal.  Skin: Skin is warm and dry. No rash noted. She is not diaphoretic. No erythema. No pallor.  Psychiatric: She has a normal mood and affect. Her behavior is normal. Judgment and thought content normal.  Vitals reviewed.  Vitals:   11/02/16 1130  BP: (!) 152/80  Pulse: 72  SpO2: 98%  Weight: 191 lb 6.4 oz (86.8 kg)  Height: 5\' 5"  (1.651 m)    Estimated body mass index is 31.85 kg/m as calculated from the following:   Height as of this encounter: 5\' 5"  (1.651 m).   Weight as of this encounter: 191 lb 6.4 oz (86.8 kg).     Assessment:       ICD-9-CM ICD-10-CM   1. Dyspnea and respiratory abnormality 786.09 R06.00     R06.89   2. 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 Dyspnea and respiratory abnormality - cpst test  suggested weight as reason for shortness of breath  - too bad you could not do rehab due to plantar fascitis  - let us wait till march2018 before re-referring you to rehab (due to sinus surgery)   3. 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 Brand Males, M.D., [facility]C.P Pulmonary and Critical Care Medicine Staff Physician [place] Pager: [phone], If no answer or between  15:00h - 7:00h: call [phone]  11/02/2016 11:54 AM

## 2016-11-04 ENCOUNTER — Ambulatory Visit (HOSPITAL_COMMUNITY): Payer: Medicare PPO | Attending: Cardiology

## 2016-11-04 ENCOUNTER — Other Ambulatory Visit: Payer: Self-pay

## 2016-11-04 ENCOUNTER — Encounter: Payer: Self-pay | Admitting: Cardiology

## 2016-11-04 DIAGNOSIS — I272 Pulmonary hypertension, unspecified: Secondary | ICD-10-CM | POA: Diagnosis not present

## 2016-11-04 DIAGNOSIS — I119 Hypertensive heart disease without heart failure: Secondary | ICD-10-CM | POA: Diagnosis not present

## 2016-11-04 DIAGNOSIS — Z8249 Family history of ischemic heart disease and other diseases of the circulatory system: Secondary | ICD-10-CM | POA: Diagnosis not present

## 2016-11-04 DIAGNOSIS — G4733 Obstructive sleep apnea (adult) (pediatric): Secondary | ICD-10-CM | POA: Diagnosis not present

## 2016-11-06 ENCOUNTER — Telehealth: Payer: Self-pay | Admitting: *Deleted

## 2016-11-06 NOTE — Telephone Encounter (Signed)
Spoke to the patient and she will let me know what day she is coming by the office to bring her chip so I can get the download on it

## 2016-11-09 ENCOUNTER — Other Ambulatory Visit: Payer: Self-pay | Admitting: Otolaryngology

## 2016-11-11 ENCOUNTER — Ambulatory Visit
Admission: RE | Admit: 2016-11-11 | Discharge: 2016-11-11 | Disposition: A | Discharge status: Home / Self Care | Payer: Medicare PPO | Source: Ambulatory Visit | Attending: Family Medicine | Admitting: Family Medicine

## 2016-11-11 ENCOUNTER — Encounter (HOSPITAL_COMMUNITY)
Admission: RE | Admit: 2016-11-11 | Discharge: 2016-11-11 | Disposition: A | Discharge status: Home / Self Care | Payer: Medicare PPO | Source: Ambulatory Visit | Attending: Otolaryngology | Admitting: Otolaryngology

## 2016-11-11 ENCOUNTER — Encounter (HOSPITAL_COMMUNITY): Payer: Self-pay

## 2016-11-11 DIAGNOSIS — J342 Deviated nasal septum: Secondary | ICD-10-CM | POA: Insufficient documentation

## 2016-11-11 DIAGNOSIS — Z01812 Encounter for preprocedural laboratory examination: Secondary | ICD-10-CM | POA: Diagnosis not present

## 2016-11-11 DIAGNOSIS — J328 Other chronic sinusitis: Secondary | ICD-10-CM | POA: Diagnosis not present

## 2016-11-11 DIAGNOSIS — Z1231 Encounter for screening mammogram for malignant neoplasm of breast: Secondary | ICD-10-CM

## 2016-11-11 HISTORY — DX: Other specified postprocedural states: R11.2

## 2016-11-11 HISTORY — DX: Other specified postprocedural states: Z98.890

## 2016-11-11 LAB — BASIC METABOLIC PANEL
Anion gap: 10 (ref 5–15)
BUN: 6 mg/dL (ref 6–20)
CHLORIDE: 101 mmol/L (ref 101–111)
CO2: 27 mmol/L (ref 22–32)
CREATININE: 0.69 mg/dL (ref 0.44–1.00)
Calcium: 9.1 mg/dL (ref 8.9–10.3)
GFR calc Af Amer: >60 mL/min (ref >60)
GFR calc non Af Amer: >60 mL/min (ref >60)
GLUCOSE: 101 mg/dL — AB (ref 65–99)
POTASSIUM: 3.5 mmol/L (ref 3.5–5.1)
SODIUM: 138 mmol/L (ref 135–145)

## 2016-11-11 LAB — CBC
HEMATOCRIT: 39.7 % (ref 36.0–46.0)
Hemoglobin: 13.2 g/dL (ref 12.0–15.0)
MCH: 29.8 pg (ref 26.0–34.0)
MCHC: 33.2 g/dL (ref 30.0–36.0)
MCV: 89.6 fL (ref 78.0–100.0)
PLATELETS: 186 10*3/uL (ref 150–400)
RBC: 4.43 MIL/uL (ref 3.87–5.11)
RDW: 13.2 % (ref 11.5–15.5)
WBC: 6.8 10*3/uL (ref 4.0–10.5)

## 2016-11-11 NOTE — Pre-Procedure Instructions (Signed)
    Amillia Soria [facility]  11/11/2016      [id number] - [place], [place] - [address] [address] Phone: [phone] Fax: [phone]  [place], [place] [address] Phone: [phone] Fax: [phone]  [place], [place] [address] [place] [address] Phone: [phone] Fax: [phone]    Your procedure is scheduled on December 20  Report to [place] [facility] [facility] at 8 A.M.  Call this number if you have problems the morning of surgery:  [phone]   Remember:  Do not eat food or drink liquids after midnight.  Take these medicines the morning of surgery with A SIP OF WATER : citalopram (CELEXA), esomeprazole (NEXIUM),  amoxicillin-clavulanate (AUGMENTIN), fexofenadine (ALLEGRA), montelukast (SINGULAIR),potassium chloride(MICRO-K)    if needed:  oxyCODONE-acetaminophen (PERCOCET/ROXICET)   STOP ASPIRIN, HERBAL MEDICATIONS, NSAIDS   Do not wear jewelry, make-up or nail polish.  Do not wear lotions, powders, or perfumes, or deoderant.  Do not shave 48 hours prior to surgery.  Men may shave face and neck.  Do not bring valuables to the hospital.  [facility] is not responsible for any belongings or valuables.  Contacts, dentures or bridgework may not be worn into surgery.  Leave your suitcase in the car.  After surgery it may be brought to your room.  For patients admitted to the hospital, discharge time will be determined by your treatment team.  Patients discharged the day of surgery will not be allowed to drive home.   Name and phone number of your driver:    Special instructions:  PREPARING FOR SURGERY  Please read over the following fact sheets that you were given.

## 2016-11-18 ENCOUNTER — Ambulatory Visit (HOSPITAL_COMMUNITY)
Admission: RE | Admit: 2016-11-18 | Discharge: 2016-11-18 | Disposition: A | Discharge status: Home / Self Care | Payer: Medicare PPO | Source: Ambulatory Visit | Attending: Otolaryngology | Admitting: Otolaryngology

## 2016-11-18 ENCOUNTER — Ambulatory Visit (HOSPITAL_COMMUNITY): Payer: Medicare PPO | Admitting: Certified Registered Nurse Anesthetist

## 2016-11-18 ENCOUNTER — Encounter (HOSPITAL_COMMUNITY): Payer: Self-pay | Admitting: Urology

## 2016-11-18 ENCOUNTER — Encounter (HOSPITAL_COMMUNITY)
Admission: RE | Disposition: A | Discharge status: Home / Self Care | Payer: Self-pay | Source: Ambulatory Visit | Attending: Otolaryngology

## 2016-11-18 DIAGNOSIS — I10 Essential (primary) hypertension: Secondary | ICD-10-CM | POA: Insufficient documentation

## 2016-11-18 DIAGNOSIS — Z8249 Family history of ischemic heart disease and other diseases of the circulatory system: Secondary | ICD-10-CM | POA: Diagnosis not present

## 2016-11-18 DIAGNOSIS — Z880 Allergy status to penicillin: Secondary | ICD-10-CM | POA: Diagnosis not present

## 2016-11-18 DIAGNOSIS — J320 Chronic maxillary sinusitis: Secondary | ICD-10-CM | POA: Diagnosis present

## 2016-11-18 DIAGNOSIS — F419 Anxiety disorder, unspecified: Secondary | ICD-10-CM | POA: Insufficient documentation

## 2016-11-18 DIAGNOSIS — G2581 Restless legs syndrome: Secondary | ICD-10-CM | POA: Diagnosis not present

## 2016-11-18 DIAGNOSIS — J343 Hypertrophy of nasal turbinates: Secondary | ICD-10-CM | POA: Diagnosis not present

## 2016-11-18 DIAGNOSIS — G4733 Obstructive sleep apnea (adult) (pediatric): Secondary | ICD-10-CM | POA: Diagnosis not present

## 2016-11-18 DIAGNOSIS — Z8673 Personal history of transient ischemic attack (TIA), and cerebral infarction without residual deficits: Secondary | ICD-10-CM | POA: Diagnosis not present

## 2016-11-18 DIAGNOSIS — J329 Chronic sinusitis, unspecified: Secondary | ICD-10-CM

## 2016-11-18 DIAGNOSIS — J342 Deviated nasal septum: Secondary | ICD-10-CM | POA: Diagnosis not present

## 2016-11-18 HISTORY — PX: NASAL SEPTOPLASTY W/ TURBINOPLASTY: SHX2070

## 2016-11-18 HISTORY — PX: SINUS ENDO WITH FUSION: SHX5329

## 2016-11-18 SURGERY — SEPTOPLASTY, NOSE, WITH NASAL TURBINATE REDUCTION
Anesthesia: General | Site: Nose | Laterality: Bilateral

## 2016-11-18 MED ORDER — PROMETHAZINE HCL 25 MG/ML IJ SOLN: 6.2500 mg | INTRAMUSCULAR | Status: DC | PRN

## 2016-11-18 MED ORDER — PHENYLEPHRINE HCL 10 MG/ML IJ SOLN
INTRAVENOUS | Status: DC | PRN
  Administered 2016-11-18: 40 ug/min via INTRAVENOUS

## 2016-11-18 MED ORDER — MUPIROCIN CALCIUM 2 % EX CREA
TOPICAL_CREAM | CUTANEOUS | Status: AC
  Filled 2016-11-18: qty 15

## 2016-11-18 MED ORDER — EPHEDRINE SULFATE 50 MG/ML IJ SOLN
INTRAMUSCULAR | Status: DC | PRN
  Administered 2016-11-18: 20 mg via INTRAVENOUS

## 2016-11-18 MED ORDER — HYDROMORPHONE HCL 1 MG/ML IJ SOLN
INTRAMUSCULAR | Status: AC
  Administered 2016-11-18: 0.5 mg via INTRAVENOUS
  Filled 2016-11-18: qty 0.5

## 2016-11-18 MED ORDER — CHLORHEXIDINE GLUCONATE CLOTH 2 % EX PADS: 6.0000 | MEDICATED_PAD | Freq: Once | CUTANEOUS | Status: DC

## 2016-11-18 MED ORDER — SUCCINYLCHOLINE CHLORIDE 200 MG/10ML IV SOSY
PREFILLED_SYRINGE | INTRAVENOUS | Status: AC
  Filled 2016-11-18: qty 10

## 2016-11-18 MED ORDER — HYDROMORPHONE HCL 1 MG/ML IJ SOLN
0.2500 mg | INTRAMUSCULAR | Status: DC | PRN
  Administered 2016-11-18 (×2): 0.5 mg via INTRAVENOUS

## 2016-11-18 MED ORDER — LIDOCAINE-EPINEPHRINE 1 %-1:100000 IJ SOLN
INTRAMUSCULAR | Status: DC | PRN
  Administered 2016-11-18: 9 mL

## 2016-11-18 MED ORDER — OXYMETAZOLINE HCL 0.05 % NA SOLN
NASAL | Status: DC | PRN
  Administered 2016-11-18: 1 via TOPICAL

## 2016-11-18 MED ORDER — HYDROCODONE-ACETAMINOPHEN 5-325 MG PO TABS: 1.0000 | ORAL_TABLET | Freq: Four times a day (QID) | ORAL | 0 refills | Status: DC | PRN

## 2016-11-18 MED ORDER — FENTANYL CITRATE (PF) 100 MCG/2ML IJ SOLN
INTRAMUSCULAR | Status: DC | PRN
  Administered 2016-11-18 (×3): 50 ug via INTRAVENOUS

## 2016-11-18 MED ORDER — PROPOFOL 10 MG/ML IV BOLUS
INTRAVENOUS | Status: DC | PRN
  Administered 2016-11-18: 180 mg via INTRAVENOUS

## 2016-11-18 MED ORDER — ROCURONIUM BROMIDE 50 MG/5ML IV SOSY
PREFILLED_SYRINGE | INTRAVENOUS | Status: AC
  Filled 2016-11-18: qty 5

## 2016-11-18 MED ORDER — TRIAMCINOLONE ACETONIDE 40 MG/ML IJ SUSP
INTRAMUSCULAR | Status: AC
  Filled 2016-11-18: qty 5

## 2016-11-18 MED ORDER — LIDOCAINE 2% (20 MG/ML) 5 ML SYRINGE
INTRAMUSCULAR | Status: AC
  Filled 2016-11-18: qty 5

## 2016-11-18 MED ORDER — AMOXICILLIN-POT CLAVULANATE 875-125 MG PO TABS: 1.0000 | ORAL_TABLET | Freq: Two times a day (BID) | ORAL | 0 refills | Status: DC

## 2016-11-18 MED ORDER — LACTATED RINGERS IV SOLN
INTRAVENOUS | Status: DC
  Administered 2016-11-18 (×2): via INTRAVENOUS

## 2016-11-18 MED ORDER — FENTANYL CITRATE (PF) 100 MCG/2ML IJ SOLN
INTRAMUSCULAR | Status: AC
  Filled 2016-11-18: qty 4

## 2016-11-18 MED ORDER — HYDROMORPHONE HCL 1 MG/ML IJ SOLN
INTRAMUSCULAR | Status: AC
  Filled 2016-11-18: qty 0.5

## 2016-11-18 MED ORDER — OXYMETAZOLINE HCL 0.05 % NA SOLN
NASAL | Status: AC
  Administered 2016-11-18: 1 via NASAL
  Filled 2016-11-18: qty 15

## 2016-11-18 MED ORDER — 0.9 % SODIUM CHLORIDE (POUR BTL) OPTIME
TOPICAL | Status: DC | PRN
  Administered 2016-11-18: 1000 mL

## 2016-11-18 MED ORDER — PROMETHAZINE HCL 25 MG/ML IJ SOLN
INTRAMUSCULAR | Status: AC
  Filled 2016-11-18: qty 1

## 2016-11-18 MED ORDER — EPHEDRINE 5 MG/ML INJ
INTRAVENOUS | Status: AC
  Filled 2016-11-18: qty 10

## 2016-11-18 MED ORDER — LIDOCAINE-EPINEPHRINE (PF) 1 %-1:200000 IJ SOLN
INTRAMUSCULAR | Status: AC
  Filled 2016-11-18: qty 30

## 2016-11-18 MED ORDER — AMOXICILLIN-POT CLAVULANATE 875-125 MG PO TABS: 1.0000 | ORAL_TABLET | Freq: Two times a day (BID) | ORAL | 0 refills | Status: AC

## 2016-11-18 MED ORDER — ROCURONIUM BROMIDE 100 MG/10ML IV SOLN
INTRAVENOUS | Status: DC | PRN
  Administered 2016-11-18: 10 mg via INTRAVENOUS
  Administered 2016-11-18: 40 mg via INTRAVENOUS

## 2016-11-18 MED ORDER — MUPIROCIN 2 % EX OINT
TOPICAL_OINTMENT | CUTANEOUS | Status: AC
  Filled 2016-11-18: qty 22

## 2016-11-18 MED ORDER — SCOPOLAMINE 1 MG/3DAYS TD PT72
MEDICATED_PATCH | TRANSDERMAL | Status: AC
  Filled 2016-11-18: qty 1

## 2016-11-18 MED ORDER — SCOPOLAMINE 1 MG/3DAYS TD PT72
1.0000 | MEDICATED_PATCH | TRANSDERMAL | Status: DC
  Administered 2016-11-18: 1.5 mg via TRANSDERMAL

## 2016-11-18 MED ORDER — PROPOFOL 10 MG/ML IV BOLUS
INTRAVENOUS | Status: AC
  Filled 2016-11-18: qty 20

## 2016-11-18 MED ORDER — SUGAMMADEX SODIUM 200 MG/2ML IV SOLN
INTRAVENOUS | Status: DC | PRN
  Administered 2016-11-18: 200 mg via INTRAVENOUS

## 2016-11-18 MED ORDER — OXYMETAZOLINE HCL 0.05 % NA SOLN
1.0000 | Freq: Two times a day (BID) | NASAL | Status: DC
  Administered 2016-11-18: 1 via NASAL

## 2016-11-18 MED ORDER — VANCOMYCIN HCL IN DEXTROSE 1-5 GM/200ML-% IV SOLN
INTRAVENOUS | Status: AC
Start: 2016-11-18 — End: 2016-11-18
  Filled 2016-11-18: qty 200

## 2016-11-18 MED ORDER — MUPIROCIN 2 % EX OINT
TOPICAL_OINTMENT | CUTANEOUS | Status: DC | PRN
  Administered 2016-11-18: 1 via TOPICAL

## 2016-11-18 MED ORDER — MIDAZOLAM HCL 2 MG/2ML IJ SOLN
INTRAMUSCULAR | Status: AC
  Filled 2016-11-18: qty 2

## 2016-11-18 MED ORDER — ONDANSETRON HCL 4 MG/2ML IJ SOLN
INTRAMUSCULAR | Status: AC
  Filled 2016-11-18: qty 2

## 2016-11-18 MED ORDER — DEXAMETHASONE SODIUM PHOSPHATE 10 MG/ML IJ SOLN
10.0000 mg | Freq: Once | INTRAMUSCULAR | Status: AC
  Administered 2016-11-18: 10 mg via INTRAVENOUS

## 2016-11-18 MED ORDER — VANCOMYCIN HCL IN DEXTROSE 1-5 GM/200ML-% IV SOLN
1000.0000 mg | INTRAVENOUS | Status: AC
  Administered 2016-11-18: 1000 mg via INTRAVENOUS

## 2016-11-18 MED ORDER — SODIUM CHLORIDE 0.9 % IR SOLN
Status: DC | PRN
  Administered 2016-11-18: 1000 mL

## 2016-11-18 MED ORDER — LIDOCAINE HCL (CARDIAC) 20 MG/ML IV SOLN
INTRAVENOUS | Status: DC | PRN
Start: 2016-11-18 — End: 2016-11-18
  Administered 2016-11-18: 100 mg via INTRAVENOUS

## 2016-11-18 MED ORDER — HYDROCODONE-ACETAMINOPHEN 5-325 MG PO TABS
1.0000 | ORAL_TABLET | Freq: Once | ORAL | Status: AC
  Administered 2016-11-18: 1 via ORAL

## 2016-11-18 MED ORDER — ONDANSETRON HCL 4 MG/2ML IJ SOLN
INTRAMUSCULAR | Status: DC | PRN
  Administered 2016-11-18: 4 mg via INTRAVENOUS

## 2016-11-18 MED ORDER — MIDAZOLAM HCL 5 MG/5ML IJ SOLN
INTRAMUSCULAR | Status: DC | PRN
  Administered 2016-11-18: 2 mg via INTRAVENOUS

## 2016-11-18 MED ORDER — OXYMETAZOLINE HCL 0.05 % NA SOLN
NASAL | Status: AC
  Filled 2016-11-18: qty 15

## 2016-11-18 MED ORDER — HYDROCODONE-ACETAMINOPHEN 5-325 MG PO TABS
ORAL_TABLET | ORAL | Status: AC
  Administered 2016-11-18: 1 via ORAL
  Filled 2016-11-18: qty 1

## 2016-11-18 MED ORDER — TRIAMCINOLONE ACETONIDE 40 MG/ML IJ SUSP
INTRAMUSCULAR | Status: DC | PRN
  Administered 2016-11-18: 200 mg

## 2016-11-18 SURGICAL SUPPLY — 54 items
ATTRACTOMAT 16X20 MAGNETIC DRP (DRAPES) IMPLANT
BLADE ROTATE RAD 40 4 M4 (BLADE) ×1 IMPLANT
BLADE ROTATE TRICUT 4X13 M4 (BLADE) IMPLANT
BLADE SURG 15 STRL LF DISP TIS (BLADE) ×1 IMPLANT
BLADE SURG 15 STRL SS (BLADE) ×2
CANISTER SUCTION 2500CC (MISCELLANEOUS) ×4 IMPLANT
COAGULATOR SUCT 8FR VV (MISCELLANEOUS) IMPLANT
COAGULATOR SUCT SWTCH 10FR 6 (ELECTROSURGICAL) IMPLANT
DRAPE PROXIMA HALF (DRAPES) IMPLANT
DRESSING NASAL KENNEDY 3.5X.9 (MISCELLANEOUS) IMPLANT
DRSG NASAL KENNEDY 3.5X.9 (MISCELLANEOUS)
ELECT COATED BLADE 2.86 ST (ELECTRODE) IMPLANT
ELECT REM PT RETURN 9FT ADLT (ELECTROSURGICAL) ×2
ELECTRODE REM PT RTRN 9FT ADLT (ELECTROSURGICAL) ×1 IMPLANT
FILTER ARTHROSCOPY CONVERTOR (FILTER) ×2 IMPLANT
FLUID NSS /IRRIG 1000 ML XXX (MISCELLANEOUS) ×2 IMPLANT
GAUZE SPONGE 2X2 8PLY STRL LF (GAUZE/BANDAGES/DRESSINGS) ×1 IMPLANT
GLOVE BIOGEL M 7.0 STRL (GLOVE) ×4 IMPLANT
GLOVE BIOGEL PI IND STRL 7.0 (GLOVE) IMPLANT
GLOVE BIOGEL PI INDICATOR 7.0 (GLOVE) ×1
GLOVE SURG SS PI 7.0 STRL IVOR (GLOVE) ×1 IMPLANT
GOWN STRL REUS W/ TWL LRG LVL3 (GOWN DISPOSABLE) ×2 IMPLANT
GOWN STRL REUS W/TWL LRG LVL3 (GOWN DISPOSABLE) ×4
KIT BASIN OR (CUSTOM PROCEDURE TRAY) ×2 IMPLANT
KIT ROOM TURNOVER OR (KITS) ×2 IMPLANT
NDL 18GX1X1/2 (RX/OR ONLY) (NEEDLE) IMPLANT
NDL HYPO 25GX1X1/2 BEV (NEEDLE) ×1 IMPLANT
NEEDLE 18GX1X1/2 (RX/OR ONLY) (NEEDLE) IMPLANT
NEEDLE HYPO 25GX1X1/2 BEV (NEEDLE) ×2 IMPLANT
NS IRRIG 1000ML POUR BTL (IV SOLUTION) ×2 IMPLANT
PAD ARMBOARD 7.5X6 YLW CONV (MISCELLANEOUS) ×4 IMPLANT
PAD ENT ADHESIVE 25PK (MISCELLANEOUS) ×2 IMPLANT
PENCIL BUTTON HOLSTER BLD 10FT (ELECTRODE) IMPLANT
SPECIMEN JAR SMALL (MISCELLANEOUS) ×2 IMPLANT
SPLINT NASAL DOYLE BI-VL (GAUZE/BANDAGES/DRESSINGS) ×2 IMPLANT
SPONGE GAUZE 2X2 STER 10/PKG (GAUZE/BANDAGES/DRESSINGS) ×1
SPONGE NEURO XRAY DETECT 1X3 (DISPOSABLE) ×2 IMPLANT
SUT ETHILON 3 0 FSL (SUTURE) IMPLANT
SUT ETHILON 3 0 PS 1 (SUTURE) ×2 IMPLANT
SUT PLAIN 4 0 ~~LOC~~ 1 (SUTURE) ×2 IMPLANT
SWAB COLLECTION DEVICE MRSA (MISCELLANEOUS) IMPLANT
SYR CONTROL 10ML LL (SYRINGE) ×2 IMPLANT
TOWEL OR 17X24 6PK STRL BLUE (TOWEL DISPOSABLE) ×2 IMPLANT
TRACKER ENT INSTRUMENT (MISCELLANEOUS) ×2 IMPLANT
TRACKER ENT PATIENT (MISCELLANEOUS) ×2 IMPLANT
TRAP SPECIMEN MUCOUS 40CC (MISCELLANEOUS) IMPLANT
TRAY ENT MC OR (CUSTOM PROCEDURE TRAY) ×2 IMPLANT
TUBE ANAEROBIC SPECIMEN COL (MISCELLANEOUS) IMPLANT
TUBE CONNECTING 12X1/4 (SUCTIONS) ×2 IMPLANT
TUBE SALEM SUMP 16 FR W/ARV (TUBING) ×2 IMPLANT
TUBING EXTENTION W/L.L. (IV SETS) ×2 IMPLANT
TUBING STRAIGHTSHOT EPS 5PK (TUBING) ×2 IMPLANT
WATER STERILE IRR 1000ML POUR (IV SOLUTION) ×2 IMPLANT
WIPE INSTRUMENT VISIWIPE 73X73 (MISCELLANEOUS) ×2 IMPLANT

## 2016-11-18 NOTE — H&P (Signed)
Amy Vang is an 70 y.o. female.   Chief Complaint: Nasal airway obstruction and recurrent sinusitis HPI: The patient has a history of recurrent sinusitis and progressive nasal airway obstruction. CT findings consistent with deviated nasal septum and chronic sinusitis.  Past Medical History:  Diagnosis Date  . Acid reflux   . Actinic keratosis    Precancerous  . Allergic rhinitis   . Anxiety   . Asthma   . CVA (cerebral infarction)   . Cystocele   . Degenerative disc disease, cervical   . Diastolic dysfunction   . GERD (gastroesophageal reflux disease) PT STATES SEVERE---- CONTROLLED W/ NEXIUM  . History of TIAs AGE 46  AND 2008---  NO RESIDUAL  . Hypertension   . Non-specific gastrointestinal complaint   . OSA (obstructive sleep apnea)    Severe CPAP 12 MM H2O- Dr Radford Pax  . PONV (postoperative nausea and vomiting)   . Pulmonary HTN    Moderate with PASP 36mmHg by echo 03/2016  . Restless leg syndrome   . Rosacea   . SUI (stress urinary incontinence, female)     Past Surgical History:  Procedure Laterality Date  . BILATERAL BENIGN BREAST BX'S  20 YRS AGO  . CARDIAC CATHETERIZATION N/A 08/13/2016   Procedure: Right Heart Cath;  Surgeon: Larey Dresser, MD;  Location: [facility];  Service: Cardiovascular;  Laterality: N/A;  . CARPAL TUNNEL RELEASE  2009   BILATERAL  . CYSTOCELE REPAIR  12/11/2011   Procedure: ANTERIOR REPAIR (CYSTOCELE);  Surgeon: Claybon Jabs, MD;  Location: [facility]  Service: Urology;  Laterality: N/A;  1 1/2 hour requested for case  Anterior repair and mid Urethral Sling  . NONINVASIVE VASCULAR CAROTID STUDY  09-14-2007   BILATERAL MILD MIX PLAQUE THROUGHOUT, NO SIGNIFICANT BILATERAL ICA STENOSIS  . PUBOVAGINAL SLING  12/11/2011   Procedure: Gaynelle Arabian;  Surgeon: Claybon Jabs, MD;  Location: [facility];  Service: Urology;  Laterality: N/A;  . PULLEY RELEASE RIGHT THUMB  2009  . VAGINAL  HYSTERECTOMY  AGE 60    Family History  Problem Relation Age of Onset  . Adopted: Yes  . Diabetes Mother   . Stroke Mother   . Alzheimer's disease Mother   . Hypertension Sister   . Diabetes Sister   . Heart attack Brother   . Leukemia Brother   . CAD Brother   . Diabetes Brother   . Colon cancer Maternal Aunt   . Stroke Paternal Grandmother   . Heart attack Paternal Grandfather    Social History:  reports that she has never smoked. She has never used smokeless tobacco. She reports that she does not drink alcohol or use drugs.  Allergies:  Allergies  Allergen Reactions  . Ceftin [Cefuroxime Axetil]     Lack of Therapeutic Effect  . Hydroxyzine Hcl     unknown  . Lactose Intolerance (Gi)     bloating  . Penicillins Hives    Has patient had a PCN reaction causing immediate rash, facial/tongue/throat swelling, SOB or lightheadedness with hypotension: Yes Has patient had a PCN reaction causing severe rash involving mucus membranes or skin necrosis: Yes Has patient had a PCN reaction that required hospitalization No Has patient had a PCN reaction occurring within the last 10 years: No If all of the above answers are "NO", then may proceed with Cephalosporin use.   Amy Vang [Sulfamethoxazole-Trimethoprim] Rash    Facility-Administered Medications Prior to Admission  Medication Dose  Route Frequency Provider Last Rate Last Dose  . triamcinolone acetonide (KENALOG) 10 MG/ML injection 10 mg  10 mg Other Once Trula Slade, DPM       Medications Prior to Admission  Medication Sig Dispense Refill  . acetaminophen (TYLENOL) 500 MG tablet Take 1,000 mg by mouth every 6 (six) hours as needed for moderate pain.    Marland Kitchen amoxicillin-clavulanate (AUGMENTIN) 875-125 MG tablet Take 1 tablet by mouth 2 (two) times daily.    Marland Kitchen CALCIUM-MAGNESIUM-VITAMIN D PO Take 1 tablet by mouth daily.    . citalopram (CELEXA) 20 MG tablet 10 mg oral daily  2  . clonazePAM (KLONOPIN) 1 MG tablet Take 1  mg by mouth at bedtime.     Marland Kitchen esomeprazole (NEXIUM) 40 MG capsule Take 40 mg by mouth 2 (two) times daily. Brand Name Only    . fexofenadine (ALLEGRA) 60 MG tablet Take 60 mg by mouth daily.    . hydrochlorothiazide (HYDRODIURIL) 25 MG tablet Take 25 mg by mouth daily.    . montelukast (SINGULAIR) 10 MG tablet Take 10 mg by mouth daily.     Marland Kitchen oxyCODONE-acetaminophen (PERCOCET/ROXICET) 5-325 MG tablet Take 0.5 tablets by mouth 2 (two) times daily as needed for pain.    . polyethylene glycol (MIRALAX / GLYCOLAX) packet Take 17 g by mouth daily as needed for mild constipation.     . potassium chloride (MICRO-K) 10 MEQ CR capsule Take 10 mEq by mouth daily.    . Probiotic CAPS Take 1 capsule by mouth daily.    . ranitidine (ZANTAC) 300 MG tablet TAKE 1 TABLET DAILY AS DIRECTED FOR REFLUX (Patient taking differently: take 300 mg oral at bedtime) 90 tablet 0  . rOPINIRole (REQUIP) 1 MG tablet Take 1 mg by mouth at bedtime.    . sodium chloride (OCEAN) 0.65 % SOLN nasal spray Place 1 spray into both nostrils 4 (four) times daily.      No results found for this or any previous visit (from the past 48 hour(s)). No results found.  Review of Systems  Constitutional: Negative.   HENT: Positive for congestion and sinus pain.   Respiratory: Negative.   Cardiovascular: Negative.     Blood pressure (!) 152/62, pulse 65, temperature 98 F (36.7 C), temperature source Oral, resp. rate 20, weight 87.1 kg (192 lb), SpO2 95 %. Physical Exam  Constitutional: She is oriented to person, place, and time. She appears well-developed and well-nourished.  HENT:  Deviated nasal septum and airway obstruction  Neck: Normal range of motion. Neck supple.  Cardiovascular: Normal rate.   Respiratory: Effort normal.  Musculoskeletal: Normal range of motion.  Neurological: She is alert and oriented to person, place, and time.     Assessment/Plan Patient admitted for sinonasal surgery including nasal septoplasty,  turbinate reduction and bilateral endoscopic sinus surgery.  Amy Duerson, MD 11/18/2016, 9:31 AM

## 2016-11-18 NOTE — Anesthesia Preprocedure Evaluation (Addendum)
Anesthesia Evaluation  Patient identified by MRN, date of birth, ID band Patient awake    Reviewed: Allergy & Precautions, NPO status , Patient's Chart, lab work & pertinent test results  History of Anesthesia Complications (+) PONV and history of anesthetic complications  Airway Mallampati: III  TM Distance: >3 FB Neck ROM: Full  Mouth opening: Limited Mouth Opening  Dental  (+) Teeth Intact, Dental Advisory Given   Pulmonary asthma , sleep apnea and Continuous Positive Airway Pressure Ventilation ,    breath sounds clear to auscultation       Cardiovascular hypertension,  Rhythm:Regular Rate:Normal  Echo and EKG noted   Neuro/Psych Anxiety    GI/Hepatic Neg liver ROS, GERD  Medicated and Controlled,  Endo/Other  negative endocrine ROS  Renal/GU negative Renal ROS     Musculoskeletal  (+) Arthritis ,   Abdominal   Peds  Hematology negative hematology ROS (+)   Anesthesia Other Findings   Reproductive/Obstetrics                          Anesthesia Physical Anesthesia Plan  ASA: III  Anesthesia Plan: General   Post-op Pain Management:    Induction: Intravenous  Airway Management Planned: Oral ETT and Video Laryngoscope Planned  Additional Equipment:   Intra-op Plan:   Post-operative Plan: Extubation in OR  Informed Consent: I have reviewed the patients History and Physical, chart, labs and discussed the procedure including the risks, benefits and alternatives for the proposed anesthesia with the patient or authorized representative who has indicated his/her understanding and acceptance.   Dental advisory given  Plan Discussed with: CRNA, Anesthesiologist and Surgeon  Anesthesia Plan Comments:       Anesthesia Quick Evaluation

## 2016-11-18 NOTE — Anesthesia Postprocedure Evaluation (Signed)
Anesthesia Post Note  Patient: Amy Vang  Procedure(s) Performed: Procedure(s) (LRB): NASAL SEPTOPLASTY WITH BILATERAL TURBINATE REDUCTION (Bilateral) BILATERAL ENDOSCOPIC SINUS SURGERY (Bilateral)  Patient location during evaluation: PACU Anesthesia Type: General Level of consciousness: awake, sedated, oriented and patient cooperative Pain management: pain level controlled Vital Signs Assessment: post-procedure vital signs reviewed and stable Respiratory status: spontaneous breathing, nonlabored ventilation, respiratory function stable and patient connected to nasal cannula oxygen Cardiovascular status: blood pressure returned to baseline and stable Postop Assessment: no signs of nausea or vomiting Anesthetic complications: no       Last Vitals:  Vitals:   11/18/16 1500 11/18/16 1515  BP:    Pulse: 92 78  Resp:    Temp:      Last Pain:  Vitals:   11/18/16 1430  TempSrc:   PainSc: 7                  Delos Klich,JAMES TERRILL

## 2016-11-18 NOTE — Transfer of Care (Signed)
Immediate Anesthesia Transfer of Care Note  Patient: Amy Vang  Procedure(s) Performed: Procedure(s): NASAL SEPTOPLASTY WITH BILATERAL TURBINATE REDUCTION (Bilateral) BILATERAL ENDOSCOPIC SINUS SURGERY (Bilateral)  Patient Location: PACU  Anesthesia Type:General  Level of Consciousness: awake, alert , oriented and patient cooperative  Airway & Oxygen Therapy: Patient Spontanous Breathing and Patient connected to nasal cannula oxygen  Post-op Assessment: Report given to RN, Post -op Vital signs reviewed and stable and Patient moving all extremities X 4  Post vital signs: Reviewed and stable  Last Vitals:  Vitals:   11/18/16 0807 11/18/16 1257  BP: (!) 152/62   Pulse: 65   Resp: 20   Temp: 36.7 C 36.4 C    Last Pain:  Vitals:   11/18/16 0816  TempSrc:   PainSc: 1          Complications: No apparent anesthesia complications

## 2016-11-18 NOTE — Anesthesia Procedure Notes (Signed)
Procedure Name: Intubation Date/Time: 11/18/2016 11:20 AM Performed by: Carney Living Pre-anesthesia Checklist: Patient identified, Emergency Drugs available, Suction available, Patient being monitored and Timeout performed Patient Re-evaluated:Patient Re-evaluated prior to inductionOxygen Delivery Method: Circle system utilized Preoxygenation: Pre-oxygenation with 100% oxygen Intubation Type: IV induction Ventilation: Mask ventilation without difficulty and Oral airway inserted - appropriate to patient size Laryngoscope Size: Glidescope and 4 Grade View: Grade I Tube type: Oral Tube size: 7.0 mm Number of attempts: 1 Airway Equipment and Method: Stylet and Video-laryngoscopy Placement Confirmation: ETT inserted through vocal cords under direct vision,  positive ETCO2 and breath sounds checked- equal and bilateral Secured at: 21 cm Tube secured with: Tape Dental Injury: Teeth and Oropharynx as per pre-operative assessment

## 2016-11-18 NOTE — Telephone Encounter (Signed)
Amy Vang called to inform us that her right foot did swell after class. Her physician made an appointment for her, received a cortizone shot, and needs to rest her foot 8-10days.

## 2016-11-18 NOTE — Brief Op Note (Signed)
11/18/2016  12:52 PM  PATIENT:  Amy Vang  70 y.o. female  PRE-OPERATIVE DIAGNOSIS:  CHRONIC MAXILLARY SINUSITIS, DEVIATED SEPTUM, NASAL TURBINATE HYPERTROPHY  POST-OPERATIVE DIAGNOSIS:  CHRONIC MAXILLARY SINUSITIS, DEVIATED SEPTUM, NASAL TURBINATE HYPERTROPHY  PROCEDURE:  Procedure(s): NASAL SEPTOPLASTY WITH BILATERAL TURBINATE REDUCTION (Bilateral) BILATERAL ENDOSCOPIC SINUS SURGERY (Bilateral)  SURGEON:  Surgeon(s) and Role:    * Jerrell Belfast, MD - Primary  PHYSICIAN ASSISTANT:   ASSISTANTS: none   ANESTHESIA:   general  EBL:  Total I/O In: 1000 [I.V.:1000] Out: - 150cc  BLOOD ADMINISTERED:none  DRAINS: none   LOCAL MEDICATIONS USED:  LIDOCAINE  and Amount: 9 ml  SPECIMEN:  Source of Specimen:  sinus contents  DISPOSITION OF SPECIMEN:  PATHOLOGY  COUNTS:  YES  TOURNIQUET:  * No tourniquets in log *  DICTATION: .Other Dictation: Dictation Number [id number]  PLAN OF CARE: Discharge to home after PACU  PATIENT DISPOSITION:  PACU - hemodynamically stable.   Delay start of Pharmacological VTE agent (>24hrs) due to surgical blood loss or risk of bleeding: not applicable

## 2016-11-19 ENCOUNTER — Encounter (HOSPITAL_COMMUNITY): Payer: Self-pay | Admitting: Otolaryngology

## 2016-11-19 NOTE — Op Note (Signed)
NAME:  Amy Vang, Amy Vang                  ACCOUNT NO.:  MEDICAL RECORD NO.:  [id number]  LOCATION:                                 FACILITY:  PHYSICIAN:  Early Chars. Wilburn Cornelia, M.D.DATE OF BIRTH:  Nov 04, 1946  DATE OF PROCEDURE:  11/18/2016 DATE OF DISCHARGE:                              OPERATIVE REPORT   LOCATION:  [facility] Main OR.  PREOPERATIVE DIAGNOSES: 1. Chronic sinusitis. 2. Nasal septal deviation with airway obstruction. 3. Bilateral inferior turbinate hypertrophy.  POSTOPERATIVE DIAGNOSES: 1. Chronic sinusitis. 2. Nasal septal deviation with airway obstruction. 3. Bilateral inferior turbinate hypertrophy.  INDICATION FOR SURGERY: 1. Chronic sinusitis. 2. Nasal septal deviation with airway obstruction. 3. Bilateral inferior turbinate hypertrophy.  PROCEDURE: 1. Bilateral endoscopic sinus surgery with intraoperative computer     navigation (Xomed fusion) consisting of bilateral total     ethmoidectomy and bilateral maxillary antrostomy with removal of     diseased tissue. 2. Nasal septoplasty. 3. Bilateral inferior turbinate reduction.  ANESTHESIA:  General endotracheal.  SURGEON:  Early Chars. Wilburn Cornelia, M.D.  COMPLICATIONS:  None.  ESTIMATED BLOOD LOSS:  150 mL.  DISPOSITION:  Patient transferred from the operating room to recovery room in stable condition.  BRIEF HISTORY:  The patient is a 70 year old female, who was referred to our office for evaluation of chronic nasal airway obstruction and cough. She has a history of chronic sinusitis with discharge and sinus pressure.  She also has history of obstructive sleep apnea and wears nasal CPAP with some difficulty because of increasing nasal congestion. Despite appropriate medical therapy, the patient continued to have ongoing symptoms and a followup CT scan was obtained which showed left maxillary sinus opacification and mucosal disease involving the ethmoid and maxillary sinuses bilaterally.  She  also had a severely deviated septum and bilateral inferior turbinate hypertrophy.  With the patient's history and findings and failure to respond to appropriate medical therapy, I recommended the above surgical procedures.  The risks and benefits of these procedures were discussed in detail with the patient and her family.  They understood and agreed with our plan for surgery which was scheduled on elective basis at [place].  SURGICAL PROCEDURE:  The patient was brought to the operating room on November 18, 2016, and placed in supine position on the operating table. General endotracheal anesthesia was established without difficulty. When the patient was adequately anesthetized, she was positioned and prepped and draped.  A surgical time-out was then performed with correct identification of the patient and the surgical procedure.  Her nose was then injected with a total of 9 mL of 1% lidocaine with 1:100,000 solution of epinephrine which was injected in a submucosal fashion along the nasal septum, inferior turbinates, lateral nasal wall, and middle turbinates.  The patient's nose then packed with Afrin-soaked cottonoid pledgets were left in place for approximately 10 minutes and allowed for vasoconstriction and hemostasis.  The Fusion Headgear was placed and anatomic and surgical landmarks were identified and confirmed.  The navigation device was used throughout the sinus component of the surgical procedure.  With the patient prepped, draped, and prepared for surgery, left endoscopic sinus surgery was  begun with the 0-degree endoscope.  The middle turbinate was carefully medialized and the uncinate process was resected in its entirety.  A total ethmoidectomy was then performed by dissecting along the floor of the ethmoid sinus from anterior to posterior removing bony septations and diseased mucosa.  The roof of the ethmoid sinus identified and a 45-degree telescope  and curved microdebrider with navigation was used from posterior to anterior, again removing diseased mucosa and creating a widely patent common ethmoid cavity.  Attention was then turned to the lateral nasal wall, where polypoid material was resected from the natural ostium of the maxillary sinus, it was enlarged in a posterior-inferior direction and within the sinus, there was heavy polypoid disease and mucopurulent discharge within the sinus which was cleared without difficulty.  The patient's right endoscopic sinus surgery was then undertaken.  Again using a 0-degree endoscope and a microdebrider, total ethmoidectomy was performed from anterior to posterior and then from posterior to anterior with a 45-degree telescope and a curved microdebrider with navigation. The uncinate process was resected in its entirety.  The lateral nasal wall was inspected.  The natural ostium of the maxillary sinus identified and enlarged in a posterior and inferior direction.  The polypoid material was resected and the sinus was cleared of disease.  Nasal septoplasty was then performed.  A left anterior hemitransfixion incision was created and mucoperichondrial flap was elevated from anterior to posterior on the left-hand side.  Anterior cartilaginous septum was crossed and a mucoperichondrial flap was elevated on the right.  Mid septal cartilage was removed.  Dissection was then carried from anterior to posterior where severe bony septal deviation was encountered.  This was resected with a through-cutting forceps and a small osteotome.  The septum was brought to a good midline position. The morselized cartilage was returned to the mucoperichondrial pocket and the mucoperichondrial flaps were reapproximated with a 4-0 gut suture on a Keith needle in a horizontal mattress fashion.  At the conclusion of the surgical procedure, bilateral Doyle nasal septal splints were placed after the application of  Bactroban ointment, however, sutured in position with a 3-0 Ethilon suture.  Bilateral inferior turbinate reduction was then performed with cautery set at 12 watts.  Two submucosal passes were made in each inferior turbinate. When the turbinates were adequately cauterized, anterior incisions were created, overlying soft tissue was elevated and small amount of turbinate bone was resected.  The turbinates were then outfractured creating more patent nasal cavity.  The patient's nasal cavity was then inspected.  Surgical debris was cleared.  There was minimal active bleeding.  The left maxillary sinus was instilled with a Kenalog and Bactroban slurry consisting of Kenalog 40 and Bactroban ointment, which was instilled in the left maxillary and ethmoid sinus.  The nasopharynx was suctioned and orogastric tube was passed.  Stomach contents were aspirated.  The patient was awakened from anesthetic.  She was then extubated and transferred from the operating room to the recovery room in stable condition.  No complications.  Blood loss less than 150 mL.          ______________________________ Early Chars. Wilburn Cornelia, M.D.     DLS/MEDQ  D:  [id number]  T:  11/19/2016  Job:  [id number]

## 2016-12-22 NOTE — Telephone Encounter (Signed)
error 

## 2017-01-04 ENCOUNTER — Encounter: Payer: Self-pay | Admitting: Internal Medicine

## 2017-01-04 ENCOUNTER — Ambulatory Visit (INDEPENDENT_AMBULATORY_CARE_PROVIDER_SITE_OTHER): Payer: Medicare PPO | Admitting: Internal Medicine

## 2017-01-04 VITALS — BP 146/70 | HR 64 | Ht 65.0 in

## 2017-01-04 DIAGNOSIS — R0989 Other specified symptoms and signs involving the circulatory and respiratory systems: Secondary | ICD-10-CM

## 2017-01-04 DIAGNOSIS — J387 Other diseases of larynx: Secondary | ICD-10-CM

## 2017-01-04 DIAGNOSIS — R6889 Other general symptoms and signs: Secondary | ICD-10-CM | POA: Diagnosis not present

## 2017-01-04 DIAGNOSIS — R0600 Dyspnea, unspecified: Secondary | ICD-10-CM

## 2017-01-04 DIAGNOSIS — R0689 Other abnormalities of breathing: Secondary | ICD-10-CM

## 2017-01-04 MED ORDER — GABAPENTIN 300 MG PO CAPS: ORAL_CAPSULE | ORAL | 5 refills | Status: DC

## 2017-01-04 NOTE — Addendum Note (Signed)
Addended by: Collier Salina on: 01/04/2017 11:46 AM   Modules accepted: Orders

## 2017-01-04 NOTE — Progress Notes (Signed)
Subjective:     Patient ID: Amy Vang, female   DOB: 19-Jan-1946, 71 y.o.   MRN: [id number]  HPI     PC Reginia Naas, MD Referred by Dr [name] of cardiology  HPI  IOV 05/21/2016  Chief Complaint  Patient presents with  . Pulmonary Consult    Ref. by Dr. Sabino Snipes. PFT last mth.@[facility] .Sob with exertion x 1 yr. Humidity makes worse, wheezing occass.,dry cough occass., clears throat a lot, hoarseness,denies cp or tightness.Wears CPAP each night doing well.     71 year old female accompanied by her husband. Referred by Dr. Radford Pax for shortness of breath and wheezing evaluation. History obtained from patient, husband and review of the chart  She has chronic throat clearing. Last 4 years. Also for the last 4 years she's been on CPAP for obstructive sleep apnea which has been helping her. Reports that in 2015/2016 her husband had pneumonia and at the same time she had acute bronchitis. After that she's had chronic wheezing for which she was on Qvar and has a diagnosis of asthma. Is being treated by Dr. Neldon Mc at allergy clinic. However the Qvar did not help her. In addition it made her have diaphoresis saw a few months ago she stopped it and started taking Symbicort which also did not help and now for the last 8 days she is not taking any inhaler. Throughout this time her chronic throat clearing is unchanged and persistent and moderate. Her main symptoms in the last year and half since the diagnosis of asthma is one of dyspnea and wheezing. Her dyspnea is of onset since the restaurant illness. It is present on exertion such as walking 3 laps in our office or climbing stairs. It is relieved by rest. The inhalers are not helping her. It is associated with wheezing. Pollen also makes a wheeze but otherwise no exacerbating factors. Albuterol helps. Symptoms and she takes it at least twice a week. There are no nocturnal symptoms no nocturnal awakening. No orthopnea or paroxysmal  nocturnal dyspnea.  There is concern that she has elevated pulmonary artery systolic pressure and diastolic dysfunction and is documented in a series of tests below. She's now been referred to pulmonary because of low diffusion capacity and elevated pulmonary artery systolic pressure on echo    Laboratory data so far  Echocardiogram 03/31/2016 left ventricular hypertrophy with grade 1 diastolic dysfunction and elevated pulmonary artery systolic pressure 46 mmHg and increased compared to one year ago-  - creatinine 0.66 mg 04/22/2016 normal - Hemoglobin 12.8 g percent 04/22/2016 - Sedimentation rate of 1 04/22/2016 and normal ANA and rheumatoid factor -  normal BNP 04/22/2016 - CT angiogram chest 04/14/2016 no evidence of pulmonary embolism and normal lung parenchyma personally visualized this test  - Pulmonary function test 04/16/2016 normal but with slightly reduced effusion capacity of 16.5/64% - Nuclear medicine myocardial perfusion test 04/30/2016 normal with an ejection fraction of 71%   - Walking desaturation test 185 feet 3 laps on room air in the office 05/21/2016 : Got dyspneic and the third lap but did not desaturate below 94% (started off at 94%). Restng HR 60 and pk HR 103/min  - Exhaled NO 05/21/2016 - 13 ppb (off symbicoxrt x 8 days)     7/12/2017Follow Up for CPST  Pt. Presents for results of her CPST.I explained that the study was essentially normal.We discussed that her dyspnea is most likely related to her weight and deconditioning. She states since she and her husband were sick  this fall she has not exercised much.We discussed the option of Pulmonary Rehab. She likes the idea of exercising in a controlled environment with medical staff to monitor. We discussed her cough and possible interventions to assist in its management. She states she is compliant with her CPAP every night. She denies chest pain, fever, purulent secretions, orthopnea or hemoptysis.   Tests CPST  05/26/2016  Conclusion: Exercise testing with gas-exchange demonstrates normal functional capacity when compared to matched sedentary norms. There is no evidence of cardiopulmonary limitation or exercise-induced bronchospasm. Patient appears primarily limited due to her body habitus. 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  Resting HR: 72 Peak HR: 121  (80% age predicted max HR)  Pre-Exercise PFTs   FVC 2.76 (91%)     FEV1 2.07 (89%)      FEV1/FVC 75 (96%)      MVV 80 (89%)  Post-Exercise PFTs ((from lowest post-exercise trial (%change from rest)) FVC performed IPE, 5, 10, 15 mins  FVC 2.71 (-1%) 15 mins     FEV1 2.02 (-2%) 15 mins      FEV1/FVC 64 (-15%) 5 mins      BP rest: 148/82 BP peak: 170/66      OV 11/02/2016  Chief Complaint  Patient presents with  . Follow-up    Pt states her SOB has improved since last OV. Pt c/o dry cough. Pt denies CP/tightness.    Fu dyspnea: CPTS was normal but suggested obestity, and chronotropic incompetence. REhab recommended but she could not do it due plantar fascitis  Fu cough: she has had this for 20years. On and off. Mostly throat clearing and larynbgeal quality of voice. Today she tells me she has bad chronic sinus disease and Dr Wilburn Cornelia will oiperate end of this month. Cough wakes her up at night. Nasal passages always congested  OV 01/04/2017  Chief Complaint  Patient presents with  . Follow-up    Pt states since the sinus surgery she feels she is breathing better. Pt states she is still having SOB but it is improved. Pt also c/o throat clearing. Pt deneis CP/tightness and f/c/s.        follow-up dypnea with CPS to suggesting obesty nd chronotropic incompetence.Unable to do rehabilitation because of plantar fasciitis. Shetells me that aftersius surgery [see below] her dyspnea is improve bt still persistent and variable and random    Fllowup chronic ogh: after sinus sugery in Dcember 2017 this is  significantly mproved but stil there is significant residual cough laryngeal qality with ignificant clearing of he throat. She feels tha this I stll affecting her quality of life. She does wantthis addresed   has a past medical history of Acid reflux; Actinic keratosis; Allergic rhinitis; Anxiety; Asthma; CVA (cerebral infarction); Cystocele; Degenerative disc disease, cervical; Diastolic dysfunction; GERD (gastroesophageal reflux disease) (PT STATES SEVERE---- CONTROLLED W/ NEXIUM); History of TIAs (AGE 69  AND 2008---  NO RESIDUAL); Hypertension; Non-specific gastrointestinal complaint; OSA (obstructive sleep apnea); PONV (postoperative nausea and vomiting); Pulmonary HTN; Restless leg syndrome; Rosacea; and SUI (stress urinary incontinence, female).   reports that she has never smoked. She has never used smokeless tobacco.  Past Surgical History:  Procedure Laterality Date  . BILATERAL BENIGN BREAST BX'S  20 YRS AGO  . CARDIAC CATHETERIZATION N/A 08/13/2016   Procedure: Right Heart Cath;  Surgeon: Larey Dresser, MD;  Location: [facility] [place] [facility];  Service: Cardiovascular;  Laterality: N/A;  . CARPAL TUNNEL RELEASE  2009   BILATERAL  . CYSTOCELE REPAIR  12/11/2011   Procedure: ANTERIOR REPAIR (CYSTOCELE);  Surgeon: Claybon Jabs, MD;  Location: [facility];  Service: Urology;  Laterality: N/A;  1 1/2 hour requested for case  Anterior repair and mid Urethral Sling  . NASAL SEPTOPLASTY W/ TURBINOPLASTY Bilateral 11/18/2016   Procedure: NASAL SEPTOPLASTY WITH BILATERAL TURBINATE REDUCTION;  Surgeon: Jerrell Belfast, MD;  Location: [facility];  Service: ENT;  Laterality: Bilateral;  . NONINVASIVE VASCULAR CAROTID STUDY  09-14-2007   BILATERAL MILD MIX PLAQUE THROUGHOUT, NO SIGNIFICANT BILATERAL ICA STENOSIS  . PUBOVAGINAL SLING  12/11/2011   Procedure: Gaynelle Arabian;  Surgeon: Claybon Jabs, MD;  Location: [facility];  Service: Urology;  Laterality: N/A;  . PULLEY  RELEASE RIGHT THUMB  2009  . SINUS ENDO WITH FUSION Bilateral 11/18/2016   Procedure: BILATERAL ENDOSCOPIC SINUS SURGERY;  Surgeon: Jerrell Belfast, MD;  Location: [place];  Service: ENT;  Laterality: Bilateral;  . VAGINAL HYSTERECTOMY  AGE 42    Allergies  Allergen Reactions  . Ceftin [Cefuroxime Axetil]     Lack of Therapeutic Effect  . Hydroxyzine Hcl     unknown  . Lactose Intolerance (Gi)     bloating  . Penicillins Hives    Has patient had a PCN reaction causing immediate rash, facial/tongue/throat swelling, SOB or lightheadedness with hypotension: Yes Has patient had a PCN reaction causing severe rash involving mucus membranes or skin necrosis: Yes Has patient had a PCN reaction that required hospitalization No Has patient had a PCN reaction occurring within the last 10 years: No If all of the above answers are "NO", then may proceed with Cephalosporin use.   [name] [Sulfamethoxazole-Trimethoprim] Rash    Immunization History  Administered Date(s) Administered  . Influenza Split 08/22/2015  . Influenza,inj,Quad PF,36+ Mos 08/30/2016  . Pneumococcal Conjugate-13 09/01/2016    Family History  Problem Relation Age of Onset  . Adopted: Yes  . Diabetes Mother   . Stroke Mother   . Alzheimer's disease Mother   . Hypertension Sister   . Diabetes Sister   . Heart attack Brother   . Leukemia Brother   . CAD Brother   . Diabetes Brother   . Colon cancer Maternal Aunt   . Stroke Paternal Grandmother   . Heart attack Paternal Grandfather      Current Outpatient Prescriptions:  .  acetaminophen (TYLENOL) 500 MG tablet, Take 1,000 mg by mouth every 6 (six) hours as needed for moderate pain., Disp: , Rfl:  .  CALCIUM-MAGNESIUM-VITAMIN D PO, Take 1 tablet by mouth daily., Disp: , Rfl:  .  citalopram (CELEXA) 20 MG tablet, 10 mg oral daily, Disp: , Rfl: 2 .  esomeprazole (NEXIUM) 40 MG capsule, Take 40 mg by mouth 2 (two) times daily. Brand Name Only, Disp: , Rfl:  .   fexofenadine (ALLEGRA) 60 MG tablet, Take 60 mg by mouth daily., Disp: , Rfl:  .  fluticasone (FLONASE) 50 MCG/ACT nasal spray, Place 2 sprays into both nostrils daily., Disp: , Rfl:  .  hydrochlorothiazide (HYDRODIURIL) 25 MG tablet, Take 25 mg by mouth daily., Disp: , Rfl:  .  montelukast (SINGULAIR) 10 MG tablet, Take 10 mg by mouth daily. , Disp: , Rfl:  .  polyethylene glycol (MIRALAX / GLYCOLAX) packet, Take 17 g by mouth daily as needed for mild constipation. , Disp: , Rfl:  .  potassium chloride (MICRO-K) 10 MEQ CR capsule, Take 10 mEq by mouth daily., Disp: , Rfl:  .  Probiotic CAPS,  Take 1 capsule by mouth daily., Disp: , Rfl:  .  ranitidine (ZANTAC) 300 MG tablet, TAKE 1 TABLET DAILY AS DIRECTED FOR REFLUX (Patient taking differently: take 300 mg oral at bedtime), Disp: 90 tablet, Rfl: 0 .  rOPINIRole (REQUIP) 1 MG tablet, Take 1 mg by mouth at bedtime., Disp: , Rfl:  .  sodium chloride (OCEAN) 0.65 % SOLN nasal spray, Place 1 spray into both nostrils 4 (four) times daily., Disp: , Rfl:   Current Facility-Administered Medications:  .  triamcinolone acetonide (KENALOG) 10 MG/ML injection 10 mg, 10 mg, Other, Once, Trula Slade, DPM     Review of Systems     Objective:   Physical Exam  Constitutional: She is oriented to person, place, and time. She appears well-developed and well-nourished. No distress.  HENT:  Head: Normocephalic and atraumatic.  Right Ear: External ear normal.  Left Ear: External ear normal.  Mouth/Throat: Oropharynx is clear and moist. No oropharyngeal exudate.  Eyes: Conjunctivae and EOM are normal. Pupils are equal, round, and reactive to light. Right eye exhibits no discharge. Left eye exhibits no discharge. No scleral icterus.  Neck: Normal range of motion. Neck supple. No JVD present. No tracheal deviation present. No thyromegaly present.  Cardiovascular: Normal rate, regular rhythm, normal heart sounds and intact distal pulses.  Exam reveals no  gallop and no friction rub.   No murmur heard. Pulmonary/Chest: Effort normal and breath sounds normal. No respiratory distress. She has no wheezes. She has no rales. She exhibits no tenderness.  Abdominal: Soft. Bowel sounds are normal. She exhibits no distension and no mass. There is no tenderness. There is no rebound and no guarding.  Musculoskeletal: Normal range of motion. She exhibits no edema or tenderness.  Lymphadenopathy:    She has no cervical adenopathy.  Neurological: She is alert and oriented to person, place, and time. She has normal reflexes. No cranial nerve deficit. She exhibits normal muscle tone. Coordination normal.  Skin: Skin is warm and dry. No rash noted. She is not diaphoretic. No erythema. No pallor.  Psychiatric: She has a normal mood and affect. Her behavior is normal. Judgment and thought content normal.  Vitals reviewed.   Vitals:   01/04/17 1120  BP: (!) 146/70  Pulse: 64  SpO2: 94%  Height: 5\' 5"  (1.651 m)    Estimated body mass index is 31.95 kg/m as calculated from the following:   Height as of 11/11/16: 5\' 5"  (1.651 m).   Weight as of 11/18/16: 192 lb (87.1 kg).      Assessment:       ICD-9-CM ICD-10-CM   1. 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 Chronic throat clearing 784.99 R68.89        Plan:     1. Dyspnea and respiratory abnormality - cpst test suggested weightand heart rate resppnse as reason for shortness of breath  - too bad you could not do rehab due to plantar fascitis - glad is better after sinus surgery  - let us tackle cough first and then see what happens to shortness of breath  2. 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 If this makes you too sleepy or drowsy call us and we will cut your medication dosing down    Followup  -6-8 weeks with me or NP Tammy - return sooner if needed  Dr. Brand Males, M.D., [facility].C.P Pulmonary and Critical Care Medicine Staff Physician [place] [facility] Pager: [phone], If no answer or between  15:00h - 7:00h: call [phone]  01/04/2017 11:40 AM

## 2017-01-04 NOTE — Patient Instructions (Addendum)
1. Dyspnea and respiratory abnormality - cpst test suggested weightand heart rate resppnse as reason for shortness of breath  - too bad you could not do rehab due to plantar fascitis - glad is better after sinus surgery  - let us tackle cough first and then see what happens to shortness of breath  2. Chronic throat clearing with chronic cough - improved after sinus surgery dec 2017 but residual cough is cough neuropathy  - continue to hold off  fish oil - continue acid reflux treatment - start gabapentin and refer voice rehab Mr Valentino Saxon  - Take gabapentin 300mg  once daily x 5 days, then 300mg  twice daily x 5 days, then 300mg  three times daily to continue. If this makes you too sleepy or drowsy call us and we will cut your medication dosing down    Followup  -6-8 weeks with me or NP Tammy - return sooner if needed

## 2017-01-11 ENCOUNTER — Ambulatory Visit: Payer: Medicare PPO | Attending: Internal Medicine

## 2017-01-11 DIAGNOSIS — R498 Other voice and resonance disorders: Secondary | ICD-10-CM | POA: Diagnosis present

## 2017-01-11 NOTE — Patient Instructions (Signed)
  Focus on sniff-blow and set your mind on you swinging on the [place] of the [place] when you feel need to throat clear or cough.  We will work on abdominal breathing next session.

## 2017-01-11 NOTE — Therapy (Signed)
[facility] [address] [place], [place], [address] Phone: [phone]   Fax:  [phone]  Speech Language Pathology Evaluation  Patient Details  Name: Amy Vang MRN: [id number] Date of Birth: [birth year] Referring Provider: Dennie Fetters, MD  Encounter Date: 01/11/2017      End of Session - 01/11/17 1705    Visit Number 1   Number of Visits 17   Date for SLP Re-Evaluation 03/19/17   SLP Start Time 1017   SLP Stop Time  1100   SLP Time Calculation (min) 43 min   Activity Tolerance Patient tolerated treatment well      Past Medical History:  Diagnosis Date  . Acid reflux   . Actinic keratosis    Precancerous  . Allergic rhinitis   . Anxiety   . Asthma   . CVA (cerebral infarction)   . Cystocele   . Degenerative disc disease, cervical   . Diastolic dysfunction   . GERD (gastroesophageal reflux disease) PT STATES SEVERE---- CONTROLLED W/ NEXIUM  . History of TIAs AGE 8  AND 2008---  NO RESIDUAL  . Hypertension   . Non-specific gastrointestinal complaint   . OSA (obstructive sleep apnea)    Severe CPAP 12 MM H2O- Dr Radford Pax  . PONV (postoperative nausea and vomiting)   . Pulmonary HTN    Moderate with PASP 17mmHg by echo 03/2016  . Restless leg syndrome   . Rosacea   . SUI (stress urinary incontinence, female)     Past Surgical History:  Procedure Laterality Date  . BILATERAL BENIGN BREAST BX'S  20 YRS AGO  . CARDIAC CATHETERIZATION N/A 08/13/2016   Procedure: Right Heart Cath;  Surgeon: Larey Dresser, MD;  Location: [place] [facility];  Service: Cardiovascular;  Laterality: N/A;  . CARPAL TUNNEL RELEASE  2009   BILATERAL  . CYSTOCELE REPAIR  12/11/2011   Procedure: ANTERIOR REPAIR (CYSTOCELE);  Surgeon: Claybon Jabs, MD;  Location: [facility];  Service: Urology;  Laterality: N/A;  1 1/2 hour requested for case  Anterior repair and mid Urethral Sling  . NASAL  SEPTOPLASTY W/ TURBINOPLASTY Bilateral 11/18/2016   Procedure: NASAL SEPTOPLASTY WITH BILATERAL TURBINATE REDUCTION;  Surgeon: Jerrell Belfast, MD;  Location: [place];  Service: ENT;  Laterality: Bilateral;  . NONINVASIVE VASCULAR CAROTID STUDY  09-14-2007   BILATERAL MILD MIX PLAQUE THROUGHOUT, NO SIGNIFICANT BILATERAL ICA STENOSIS  . PUBOVAGINAL SLING  12/11/2011   Procedure: Gaynelle Arabian;  Surgeon: Claybon Jabs, MD;  Location: [facility];  Service: Urology;  Laterality: N/A;  . PULLEY RELEASE RIGHT THUMB  2009  . SINUS ENDO WITH FUSION Bilateral 11/18/2016   Procedure: BILATERAL ENDOSCOPIC SINUS SURGERY;  Surgeon: Jerrell Belfast, MD;  Location: [place];  Service: ENT;  Laterality: Bilateral;  . VAGINAL HYSTERECTOMY  AGE 30    There were no vitals filed for this visit.      Subjective Assessment - 01/11/17 1655    Subjective "It's been going on for about 4 years." Pt states VCD symptoms became worse after bronchitis Dec 2015.    Currently in Pain? No/denies            SLP Evaluation [facility] - 01/11/17 1655      SLP Visit Information   SLP Received On 01/11/17   Referring Provider Dennie Fetters, MD   Onset Date approx 4 years ago   Medical Diagnosis Vocal Cord Dysfunction (VCD)     General Information  HPI Pt with hx of "two to four TIAs", and is S/P sx for deviated septum on December 20th, 2017. Saw Dr. Chase Caller 01-04-17 and he diagnosed irritable larynx/VCD. Pt is on 2 pills/day of Gabapentin, will go to 3 pills/day in 3 days.  She states she has noticed a little decr in throat clear/cough frequency since initiation of Gabapentin.     Prior Functional Status   Cognitive/Linguistic Baseline Within functional limits   Vocation Retired     Charity fundraiser Status Within Functional Limits for tasks assessed     Auditory Comprehension   Overall Auditory Comprehension Appears within functional limits for tasks assessed     Verbal Expression    Overall Verbal Expression Appears within functional limits for tasks assessed     Oral Motor/Sensory Function   Overall Oral Motor/Sensory Function Appears within functional limits for tasks assessed     Motor Speech   Respiration Impaired  chest breather in conversation   Level of Impairment Phrase   Phonation Impaired   Vocal Abuses --  sustained /a/ average 12 sec (sub-WNL), s/z ratio=.94 (WNL)   Tension Present Shoulder  slight, intermittent   Volume Appropriate       Pt and SLP discussed handout of vocal cord dysfunction (VCD) in order to educate pt about VCD and for SLP to assess triggers/causes/signs and symptoms of VCD. Pt presents minimal/no hoarse voice. Pt's symptoms of VCD include tightness in throat/chest, frequent cough and throat clear, and mild hoarseness. Causes were ID'd as GERD, upper respiratory infection, strong odors or smells (perfume, cleaners, candles-vanilla especially), and tobacco smoke. SLP and pt discussed relaxation techniques for pt to think about when VCD episodes occur including mental image of relaxation (pt sitting on porch swing of a mountain house). SLP educated pt about and taught a sniff-pursed lip blow for pt to try when in a VCD episode.  Pt was educated about abdominal breathing, how it is essential in inhibiting/eliminating symptoms of VCD.  SLP suggests pt receive ENT eval to rule out any vocal fold pathology.                    SLP Education - 01/11/17 1703    Education provided Yes   Education Details sniff/blow technique, need for abdominal breathing, need to have relaxation image (sitting in swing on front porch of mountain house)   Person(s) Educated Patient   Methods Explanation;Handout   Comprehension Verbalized understanding;Returned demonstration;Verbal cues required;Need further instruction          SLP Short Term Goals - 01/11/17 1712      SLP SHORT TERM GOAL #1   Title pt will use abdominal breathing (AB)  in sentence tasks 17/20 with rare min A   Time 4   Period Weeks   Status New     SLP SHORT TERM GOAL #2   Title pt will demo AB at rest 80% of the time   Time 4   Period Weeks   Status New     SLP SHORT TERM GOAL #3   Title pt will decr throat clears during ST sessions to 15 over three sessions   Time 4   Period Weeks     SLP SHORT TERM GOAL #4   Title pt will report usage of compensations for relaxation outside of therapy sessions x2 sessions   Time 4   Period Weeks   Status New          SLP Long  Term Goals - 02-02-17 1714      SLP LONG TERM GOAL #1   Title pt will use abdominal breathing (AB) in simple conversation of 10 minutes 75% of the time over two sessions   Time 8   Period Weeks   Status New     SLP LONG TERM GOAL #2   Title pt will demo less than 10 throat clears per session in 4 therapy sessions   Time 8   Period Weeks   Status New     SLP LONG TERM GOAL #3   Title pt will report at least 50% reduction in overt s/s VCD compared to pre-ST   Time 8   Period Weeks   Status New     SLP LONG TERM GOAL #4   Title pt will report using relaxation strategies outside sessions between 5 ST sessions   Time 8   Period Weeks   Status New          Plan - 2017-02-02 1705    Clinical Impression Statement Pt presents today with 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 She cleared her throat 39 times in this 40 minute evaluation, without production of mucous/phlegm. Pt was educated today on compensations for VCD as well as need to use abdominal breathing (AB) during the day, even when talking, for frequency to subside/be eliminated. Skilled ST is needed to teach pt to use AB in all of her speaking situations during the day, as well as to use compensatory strategies for VCD incuding relaxation/imagery.Marland Kitchen   Speech Therapy Frequency 2x / week   Duration --  8 weeks   Treatment/Interventions Compensatory strategies;Patient/family education;Functional tasks;SLP  instruction and feedback;Internal/external aids   Potential to Achieve Goals Good   Consulted and Agree with Plan of Care Patient      Patient will benefit from skilled therapeutic intervention in order to improve the following deficits and impairments:   Other voice and resonance disorders (CODE)      G-Codes - 02-Feb-2017 1716    Functional Assessment Tool Used NOMS   Functional Limitations Voice   Voice Current Status (G9171) At least 40 percent but less than 60 percent impaired, limited or restricted   Voice Goal Status (G9172) At least 1 percent but less than 20 percent impaired, limited or restricted      Problem List Patient Active Problem List   Diagnosis Date Noted  . Sinusitis, chronic 11/18/2016  . Dyspnea and respiratory abnormality 05/21/2016  . Wheeze 05/21/2016  . Chronic throat clearing 05/21/2016  . Pulmonary HTN 04/23/2016  . Moderate persistent asthma 11/12/2015  . Allergic rhinoconjunctivitis 11/12/2015  . 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  . SOB (shortness of breath) 03/06/2015  . OSA (obstructive sleep apnea) 01/01/2014  . Benign essential HTN 01/01/2014  . Obesity 01/01/2014    [facility] ,[place], [place]  February 02, 2017, 5:18 PM  [facility] [address] [place], [place], [address] Phone: [phone]   Fax:  [phone]  Name: Amy Vang MRN: [id number] Date of Birth: [birth year]

## 2017-01-17 ENCOUNTER — Encounter: Payer: Self-pay | Admitting: Internal Medicine

## 2017-01-18 ENCOUNTER — Ambulatory Visit: Payer: Medicare PPO | Admitting: Speech Pathology

## 2017-01-18 ENCOUNTER — Encounter: Payer: Self-pay | Admitting: Internal Medicine

## 2017-01-18 NOTE — Telephone Encounter (Signed)
MR please advise. Thanks! 

## 2017-01-18 NOTE — Telephone Encounter (Signed)
Outside of cough neuropathy (throat clearing) not found any pulmonary specific issue so far  Dr. Brand Males, M.D., [facility]C.P Pulmonary and Critical Care Medicine Staff Physician [facility] Pager: [phone], If no answer or between  15:00h - 7:00h: call [phone]  01/18/2017 11:39 AM

## 2017-01-19 ENCOUNTER — Ambulatory Visit: Payer: Medicare PPO

## 2017-01-19 DIAGNOSIS — R498 Other voice and resonance disorders: Secondary | ICD-10-CM

## 2017-01-19 NOTE — Patient Instructions (Signed)
  Please complete the assigned speech therapy homework prior to your next session and return it to the speech therapist at your next visit.  

## 2017-01-20 NOTE — Therapy (Signed)
[facility] [address] [place], [place], [address] Phone: [phone]   Fax:  [phone]  Speech Language Pathology Treatment  Patient Details  Name: Amy Vang MRN: [id number] Date of Birth: 10/25/1946 Referring Provider: Dennie Fetters, MD  Encounter Date: 01/19/2017      End of Session - 01/19/17 1406    Visit Number 2   Number of Visits 17   Date for SLP Re-Evaluation 03/19/17   SLP Start Time 8   SLP Stop Time  1400   SLP Time Calculation (min) 42 min   Activity Tolerance Patient tolerated treatment well      Past Medical History:  Diagnosis Date  . Acid reflux   . Actinic keratosis    Precancerous  . Allergic rhinitis   . Anxiety   . Asthma   . CVA (cerebral infarction)   . Cystocele   . Degenerative disc disease, cervical   . Diastolic dysfunction   . GERD (gastroesophageal reflux disease) PT STATES SEVERE---- CONTROLLED W/ NEXIUM  . History of TIAs AGE 4  AND 2008---  NO RESIDUAL  . Hypertension   . Non-specific gastrointestinal complaint   . OSA (obstructive sleep apnea)    Severe CPAP 12 MM H2O- Dr Radford Pax  . PONV (postoperative nausea and vomiting)   . Pulmonary HTN    Moderate with PASP 14mmHg by echo 03/2016  . Restless leg syndrome   . Rosacea   . SUI (stress urinary incontinence, female)     Past Surgical History:  Procedure Laterality Date  . BILATERAL BENIGN BREAST BX'S  20 YRS AGO  . CARDIAC CATHETERIZATION N/A 08/13/2016   Procedure: Right Heart Cath;  Surgeon: Larey Dresser, MD;  Location: [facility];  Service: Cardiovascular;  Laterality: N/A;  . CARPAL TUNNEL RELEASE  2009   BILATERAL  . CYSTOCELE REPAIR  12/11/2011   Procedure: ANTERIOR REPAIR (CYSTOCELE);  Surgeon: Claybon Jabs, MD;  Location: [facility];  Service: Urology;  Laterality: N/A;  1 1/2 hour requested for case  Anterior repair and mid Urethral Sling  . NASAL  SEPTOPLASTY W/ TURBINOPLASTY Bilateral 11/18/2016   Procedure: NASAL SEPTOPLASTY WITH BILATERAL TURBINATE REDUCTION;  Surgeon: Jerrell Belfast, MD;  Location: [facility];  Service: ENT;  Laterality: Bilateral;  . NONINVASIVE VASCULAR CAROTID STUDY  09-14-2007   BILATERAL MILD MIX PLAQUE THROUGHOUT, NO SIGNIFICANT BILATERAL ICA STENOSIS  . PUBOVAGINAL SLING  12/11/2011   Procedure: Gaynelle Arabian;  Surgeon: Claybon Jabs, MD;  Location: [facility];  Service: Urology;  Laterality: N/A;  . PULLEY RELEASE RIGHT THUMB  2009  . SINUS ENDO WITH FUSION Bilateral 11/18/2016   Procedure: BILATERAL ENDOSCOPIC SINUS SURGERY;  Surgeon: Jerrell Belfast, MD;  Location: [facility];  Service: ENT;  Laterality: Bilateral;  . VAGINAL HYSTERECTOMY  AGE 71    There were no vitals filed for this visit.      Subjective Assessment - 01/19/17 1323    Subjective Pt reports she had GI problems due to gabapentin and quit taking it because of this.   Currently in Pain? No/denies               ADULT SLP TREATMENT - 01/19/17 1325      General Information   Behavior/Cognition Alert;Cooperative;Pleasant mood     Treatment Provided   Treatment provided Cognitive-Linquistic     Cognitive-Linquistic Treatment   Treatment focused on Voice   Skilled Treatment Educated pt with abdominal breathing (  AB) today. 16 throat clears in first 15 minutes of therapy prior to AB, and twice for 14 minutes pt performed and practiced AB. Hyperventilation left pt light-headed until SLP cued pt to breathe more deeply with AB. In speech tasks, pt used AB 90% success. SLP educated pt on how to practice 15-20 minutes twice daily with AB.     Assessment / Recommendations / Plan   Plan Continue with current plan of care     Progression Toward Goals   Progression toward goals Progressing toward goals          SLP Education - 01/19/17 1406    Education provided Yes   Education Details abdominal breathing    Person(s) Educated Patient   Methods Explanation;Demonstration;Verbal cues   Comprehension Verbalized understanding;Returned demonstration;Verbal cues required;Need further instruction          SLP Short Term Goals - 01/20/17 1707      SLP SHORT TERM GOAL #1   Title pt will use abdominal breathing (AB) in sentence tasks 17/20 with rare min A   Time 4   Period Weeks   Status On-going     SLP SHORT TERM GOAL #2   Title pt will demo AB at rest 80% of the time   Time 4   Period Weeks   Status On-going     SLP SHORT TERM GOAL #3   Title pt will decr throat clears during ST sessions to 15 over three sessions   Time 4   Period Weeks   Status On-going     SLP SHORT TERM GOAL #4   Title pt will report usage of compensations for relaxation outside of therapy sessions x2 sessions   Time 4   Period Weeks   Status On-going          SLP Long Term Goals - 01/20/17 1707      SLP LONG TERM GOAL #1   Title pt will use abdominal breathing (AB) in simple conversation of 10 minutes 75% of the time over two sessions   Time 8   Period Weeks   Status On-going     SLP LONG TERM GOAL #2   Title pt will demo less than 10 throat clears per session in 4 therapy sessions   Time 8   Period Weeks   Status On-going     SLP LONG TERM GOAL #3   Title pt will report at least 50% reduction in overt s/s VCD compared to pre-ST   Time 8   Period Weeks   Status On-going     SLP LONG TERM GOAL #4   Title pt will report using relaxation strategies outside sessions between 5 ST sessions   Time 8   Period Weeks   Status On-going          Plan - 01/19/17 1406    Clinical Impression Statement Much less s/s VCD when pt practicing AB. Pt was very pleased with this. Skilled ST still necessary to carryover AB to all pt's speaking situations.   Speech Therapy Frequency 2x / week   Duration --  8 weeks   Treatment/Interventions Compensatory strategies;Patient/family education;Functional tasks;SLP  instruction and feedback;Internal/external aids   Potential to Achieve Goals Good   Consulted and Agree with Plan of Care Patient      Patient will benefit from skilled therapeutic intervention in order to improve the following deficits and impairments:   Other voice and resonance disorders (CODE)    Problem List Patient Active  Problem List   Diagnosis Date Noted  . Sinusitis, chronic 11/18/2016  . Dyspnea and respiratory abnormality 05/21/2016  . Wheeze 05/21/2016  . Chronic throat clearing 05/21/2016  . Pulmonary HTN 04/23/2016  . Moderate persistent asthma 11/12/2015  . Allergic rhinoconjunctivitis 11/12/2015  . 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  . SOB (shortness of breath) 03/06/2015  . OSA (obstructive sleep apnea) 01/01/2014  . Benign essential HTN 01/01/2014  . Obesity 01/01/2014    [facility] ,[place], [place]  01/20/2017, 5:08 PM  [facility] [address] [place], [place], [address] Phone: [phone]   Fax:  [phone]   Name: JENNALYN POZO MRN: [id number] Date of Birth: 1946/09/01

## 2017-01-25 ENCOUNTER — Ambulatory Visit: Payer: Medicare PPO

## 2017-01-25 DIAGNOSIS — R498 Other voice and resonance disorders: Secondary | ICD-10-CM

## 2017-01-25 NOTE — Therapy (Signed)
[facility] [address] [place], [place], [address] Phone: [phone]   Fax:  [phone]  Speech Language Pathology Treatment  Patient Details  Name: Amy Vang MRN: [id number] Date of Birth: 1946-09-19 Referring Provider: Dennie Fetters, MD  Encounter Date: 01/25/2017      End of Session - 01/25/17 1059    Visit Number 3   Number of Visits 14   SLP Start Time [id number]  pt arrived 6 minutes late   SLP Stop Time  1100   SLP Time Calculation (min) 37 min   Activity Tolerance Patient tolerated treatment well      Past Medical History:  Diagnosis Date  . Acid reflux   . Actinic keratosis    Precancerous  . Allergic rhinitis   . Anxiety   . Asthma   . CVA (cerebral infarction)   . Cystocele   . Degenerative disc disease, cervical   . Diastolic dysfunction   . GERD (gastroesophageal reflux disease) PT STATES SEVERE---- CONTROLLED W/ NEXIUM  . History of TIAs AGE 71  AND 2008---  NO RESIDUAL  . Hypertension   . Non-specific gastrointestinal complaint   . OSA (obstructive sleep apnea)    Severe CPAP 12 MM H2O- Dr Radford Pax  . PONV (postoperative nausea and vomiting)   . Pulmonary HTN    Moderate with PASP 71mmHg by echo 03/2016  . Restless leg syndrome   . Rosacea   . SUI (stress urinary incontinence, female)     Past Surgical History:  Procedure Laterality Date  . BILATERAL BENIGN BREAST BX'S  20 YRS AGO  . CARDIAC CATHETERIZATION N/A 08/13/2016   Procedure: Right Heart Cath;  Surgeon: Larey Dresser, MD;  Location: [facility];  Service: Cardiovascular;  Laterality: N/A;  . CARPAL TUNNEL RELEASE  2009   BILATERAL  . CYSTOCELE REPAIR  12/11/2011   Procedure: ANTERIOR REPAIR (CYSTOCELE);  Surgeon: Claybon Jabs, MD;  Location: [facility];  Service: Urology;  Laterality: N/A;  1 1/2 hour requested for case  Anterior repair and mid Urethral Sling  . NASAL SEPTOPLASTY W/  TURBINOPLASTY Bilateral 11/18/2016   Procedure: NASAL SEPTOPLASTY WITH BILATERAL TURBINATE REDUCTION;  Surgeon: Jerrell Belfast, MD;  Location: [facility];  Service: ENT;  Laterality: Bilateral;  . NONINVASIVE VASCULAR CAROTID STUDY  09-14-2007   BILATERAL MILD MIX PLAQUE THROUGHOUT, NO SIGNIFICANT BILATERAL ICA STENOSIS  . PUBOVAGINAL SLING  12/11/2011   Procedure: Gaynelle Arabian;  Surgeon: Claybon Jabs, MD;  Location: [facility];  Service: Urology;  Laterality: N/A;  . PULLEY RELEASE RIGHT THUMB  2009  . SINUS ENDO WITH FUSION Bilateral 11/18/2016   Procedure: BILATERAL ENDOSCOPIC SINUS SURGERY;  Surgeon: Jerrell Belfast, MD;  Location: [facility];  Service: ENT;  Laterality: Bilateral;  . VAGINAL HYSTERECTOMY  AGE 71    There were no vitals filed for this visit.      Subjective Assessment - 01/25/17 1025    Subjective Pt reports she has been much better at not clearing throat.   Currently in Pain? No/denies               ADULT SLP TREATMENT - 01/25/17 1026      General Information   Behavior/Cognition Alert;Cooperative;Pleasant mood     Treatment Provided   Treatment provided Cognitive-Linquistic     Cognitive-Linquistic Treatment   Treatment focused on Voice   Skilled Treatment Pt admits to not practicing abdominal breathing (AB) twice a day  for 15 minutes, but approx x1/day. She forgot to complete sentence-level homework for AB. Pt "really had to think about the deep breathing" in two-turns of dyad conversation. Given this, SLP provided pt with practice at the word/short phrase level using AB, and she answered approx 85% responses with AB beforehad, with rare min A to take breath prior to answering in order to habitualize using AB in speech situations. Pt remarked she was not light headed in any of today's session like she was last session. Pt cleared throat 12 times today during the session.     Assessment / Recommendations / Plan   Plan Continue with  current plan of care     Progression Toward Goals   Progression toward goals Progressing toward goals          SLP Education - 01/25/17 1059    Education provided Yes   Education Details abdominal breating necessary during conversation as well as at rest   Person(s) Educated Patient   Methods Explanation   Comprehension Verbalized understanding;Returned demonstration          SLP Short Term Goals - 01/25/17 1344      SLP SHORT TERM GOAL #1   Title pt will use abdominal breathing (AB) in sentence tasks 17/20 with rare min A   Period Weeks   Status Achieved     SLP SHORT TERM GOAL #2   Title pt will demo AB at rest 80% of the time   Status Achieved     SLP [place] #3   Title pt will decr throat clears during ST sessions to 15 over three sessions   Baseline 01-25-17   Time 3   Period Weeks   Status On-going     SLP SHORT TERM GOAL #4   Title pt will report usage of compensations for relaxation outside of therapy sessions x2 sessions   Time 3   Period Weeks   Status On-going          SLP Long Term Goals - 01/25/17 1345      SLP LONG TERM GOAL #1   Title pt will use abdominal breathing (AB) in simple conversation of 10 minutes 75% of the time over two sessions   Time 7   Period Weeks   Status On-going     SLP LONG TERM GOAL #2   Title pt will demo less than 10 throat clears per session in 4 therapy sessions   Time 7   Period Weeks   Status On-going     SLP LONG TERM GOAL #3   Title pt will report at least 50% reduction in overt s/s VCD compared to pre-ST   Time 7   Period Weeks   Status On-going     SLP LONG TERM GOAL #4   Title pt will report using relaxation strategies outside sessions between 5 ST sessions   Time 7   Period Weeks   Status On-going          Plan - 01/25/17 1100    Clinical Impression Statement Again, much less s/s VCD when pt practicing AB. After approx 3 minutes of pt talking, s/s VCD began. Again, when pt began to  focus on AB in single sentence answers throat clearing was non-existent. SLP made pt aware of this and encouraged her to complete her homework. Skilled ST still necessary to carryover AB to all pt's speaking situations.   Speech Therapy Frequency 2x / week   Duration --  8 weeks   Treatment/Interventions Compensatory strategies;Patient/family education;Functional tasks;SLP instruction and feedback;Internal/external aids   Potential to Achieve Goals Good   Consulted and Agree with Plan of Care Patient      Patient will benefit from skilled therapeutic intervention in order to improve the following deficits and impairments:   Other voice and resonance disorders (CODE)    Problem List Patient Active Problem List   Diagnosis Date Noted  . Sinusitis, chronic 11/18/2016  . Dyspnea and respiratory abnormality 05/21/2016  . Wheeze 05/21/2016  . Chronic throat clearing 05/21/2016  . Pulmonary HTN 04/23/2016  . Moderate persistent asthma 11/12/2015  . Allergic rhinoconjunctivitis 11/12/2015  . 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  . SOB (shortness of breath) 03/06/2015  . OSA (obstructive sleep apnea) 01/01/2014  . Benign essential HTN 01/01/2014  . Obesity 01/01/2014    [facility] ,[place], [place]  01/25/2017, 1:48 PM  [facility] [address] [place], [place], [address] Phone: [phone]   Fax:  [phone]   Name: KRISHELLE SOCHOR MRN: [id number] Date of Birth: 1946/06/01

## 2017-01-25 NOTE — Patient Instructions (Signed)
  Please complete the assigned speech therapy homework prior to your next session and return it to the speech therapist at your next visit.  

## 2017-01-28 ENCOUNTER — Ambulatory Visit: Payer: Medicare PPO | Admitting: Speech Pathology

## 2017-02-02 NOTE — Telephone Encounter (Signed)
Ms Limbaugh was called - she last attended 07/06/16. She plans to return to [place] on 09/21/16.    By Karie Fetch

## 2017-02-02 NOTE — Telephone Encounter (Signed)
Called Amy Vang - last attended 07/06/16. She has many family and health issues and can not attend [facility] at this time. We discussed participation next year when she can commit to regular attendence.    By Karie Fetch

## 2017-02-09 ENCOUNTER — Ambulatory Visit: Payer: Medicare PPO

## 2017-02-09 ENCOUNTER — Ambulatory Visit: Payer: Medicare PPO | Attending: Internal Medicine

## 2017-02-09 DIAGNOSIS — R498 Other voice and resonance disorders: Secondary | ICD-10-CM | POA: Diagnosis not present

## 2017-02-09 NOTE — Therapy (Signed)
[facility] [address] [place], [place], [address] Phone: [phone]   Fax:  [phone]  Speech Language Pathology Treatment  Patient Details  Name: Amy Vang MRN: [id number] Date of Birth: May 02, 1946 Referring Provider: Dennie Fetters, MD  Encounter Date: 02/09/2017      End of Session - 02/09/17 1653    Visit Number 4   Number of Visits 17   Date for SLP Re-Evaluation 03/19/17   SLP Start Time [id number]   SLP Stop Time  1606   SLP Time Calculation (min) 35 min   Activity Tolerance Patient tolerated treatment well      Past Medical History:  Diagnosis Date  . Acid reflux   . Actinic keratosis    Precancerous  . Allergic rhinitis   . Anxiety   . Asthma   . CVA (cerebral infarction)   . Cystocele   . Degenerative disc disease, cervical   . Diastolic dysfunction   . GERD (gastroesophageal reflux disease) PT STATES SEVERE---- CONTROLLED W/ NEXIUM  . History of TIAs AGE 21  AND 2008---  NO RESIDUAL  . Hypertension   . Non-specific gastrointestinal complaint   . OSA (obstructive sleep apnea)    Severe CPAP 12 MM H2O- Dr Radford Pax  . PONV (postoperative nausea and vomiting)   . Pulmonary HTN    Moderate with PASP 52mmHg by echo 03/2016  . Restless leg syndrome   . Rosacea   . SUI (stress urinary incontinence, female)     Past Surgical History:  Procedure Laterality Date  . BILATERAL BENIGN BREAST BX'S  20 YRS AGO  . CARDIAC CATHETERIZATION N/A 08/13/2016   Procedure: Right Heart Cath;  Surgeon: Larey Dresser, MD;  Location: [facility];  Service: Cardiovascular;  Laterality: N/A;  . CARPAL TUNNEL RELEASE  2009   BILATERAL  . CYSTOCELE REPAIR  12/11/2011   Procedure: ANTERIOR REPAIR (CYSTOCELE);  Surgeon: Claybon Jabs, MD;  [facility];  Service: Urology;  Laterality: N/A;  1 1/2 hour requested for case  Anterior repair and mid Urethral Sling  . NASAL  SEPTOPLASTY W/ TURBINOPLASTY Bilateral 11/18/2016   Procedure: NASAL SEPTOPLASTY WITH BILATERAL TURBINATE REDUCTION;  Surgeon: Jerrell Belfast, MD;  Location: [facility];  Service: ENT;  Laterality: Bilateral;  . NONINVASIVE VASCULAR CAROTID STUDY  09-14-2007   BILATERAL MILD MIX PLAQUE THROUGHOUT, NO SIGNIFICANT BILATERAL ICA STENOSIS  . PUBOVAGINAL SLING  12/11/2011   Procedure: Gaynelle Arabian;  Surgeon: Claybon Jabs, MD;  Location: [facility];  Service: Urology;  Laterality: N/A;  . PULLEY RELEASE RIGHT THUMB  2009  . SINUS ENDO WITH FUSION Bilateral 11/18/2016   Procedure: BILATERAL ENDOSCOPIC SINUS SURGERY;  Surgeon: Jerrell Belfast, MD;  Location: [facility];  Service: ENT;  Laterality: Bilateral;  . VAGINAL HYSTERECTOMY  AGE 38    There were no vitals filed for this visit.      Subjective Assessment - 02/09/17 1538    Subjective "My friend told me I wasn't clearing my throat anymore!"   Currently in Pain? No/denies               ADULT SLP TREATMENT - 02/09/17 1539      General Information   Behavior/Cognition Alert;Cooperative;Pleasant mood     Treatment Provided   Treatment provided Cognitive-Linquistic     Cognitive-Linquistic Treatment   Treatment focused on Voice   Skilled Treatment "I'm not even aware that I am coughing anymore." Pt related  she used sniff-blow technique twice since last visit. Pt with one cough in ST room in first 18 minutes of conversation. When outside, pt with one cough in 14 minutes conversation. Pt would like to have one more session to verify success persists over two weeks without ST. SLP agrees. Pt has used relaxation technique twice since last visit, and continually uses abdominal breathing (AB). Pt using in both conversation mediums today (in room and outside) with approx 80-90% success.     Assessment / Recommendations / Plan   Plan Continue with current plan of care     Progression Toward Goals   Progression toward goals  Progressing toward goals            SLP Short Term Goals - 02/09/17 1655      SLP SHORT TERM GOAL #1   Title pt will use abdominal breathing (AB) in sentence tasks 17/20 with rare min A   Period Weeks   Status Achieved     SLP SHORT TERM GOAL #2   Title pt will demo AB at rest 80% of the time   Status Achieved     SLP SHORT TERM GOAL #3   Title pt will decr throat clears during ST sessions to 15 over three sessions   Baseline 01-25-17, 02-09-17   Time 2   Period Weeks   Status On-going     SLP SHORT TERM GOAL #4   Title pt will report usage of compensations for relaxation outside of therapy sessions x2 sessions   Baseline 02-09-17   Time 2   Period Weeks   Status On-going          SLP Long Term Goals - 02/09/17 1656      SLP LONG TERM GOAL #1   Title pt will use abdominal breathing (AB) in simple conversation of 10 minutes 75% of the time over two sessions   Baseline 02-09-17   Time 6   Period Weeks   Status On-going     SLP LONG TERM GOAL #2   Title pt will demo less than 10 throat clears per session in 4 therapy sessions   Baseline 01-25-17, 02-09-17   Time 6   Period Weeks   Status On-going     SLP LONG TERM GOAL #3   Title pt will report at least 50% reduction in overt s/s VCD compared to pre-ST   Status Achieved     SLP LONG TERM GOAL #4   Title pt will report using relaxation strategies outside sessions between 5 ST sessions   Baseline 02-09-17   Time 6   Period Weeks   Status On-going          Plan - 02/09/17 1654    Clinical Impression Statement Two s/s VCD when in conversation inside [place] room and outdoors walking. Pt has had unsolicited comments since last session about her WNL voice (no hoarseness) and no coughing/throat clearing. Skilled ST still necessary to ensure success over time (pt's next visit is in approx 2 weeks).   Speech Therapy Frequency 2x / week   Duration --  8 weeks   Treatment/Interventions Compensatory  strategies;Patient/family education;Functional tasks;SLP instruction and feedback;Internal/external aids   Potential to Achieve Goals Good   Consulted and Agree with Plan of Care Patient      Patient will benefit from skilled therapeutic intervention in order to improve the following deficits and impairments:   Other voice and resonance disorders (CODE)    Problem List Patient Active  Problem List   Diagnosis Date Noted  . Sinusitis, chronic 11/18/2016  . Dyspnea and respiratory abnormality 05/21/2016  . Wheeze 05/21/2016  . Chronic throat clearing 05/21/2016  . Pulmonary HTN 04/23/2016  . Moderate persistent asthma 11/12/2015  . Allergic rhinoconjunctivitis 11/12/2015  . 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  . SOB (shortness of breath) 03/06/2015  . OSA (obstructive sleep apnea) 01/01/2014  . Benign essential HTN 01/01/2014  . Obesity 01/01/2014    [facility] ,[place], [place]  02/09/2017, 4:57 PM  [facility] [address] [place], [place], [address] Phone: [phone]   Fax:  [phone]   Name: Amy Vang MRN: [id number] Date of Birth: May 04, 1946

## 2017-02-11 ENCOUNTER — Encounter: Payer: Medicare PPO | Admitting: Speech Pathology

## 2017-02-15 ENCOUNTER — Encounter: Payer: Medicare PPO | Admitting: Speech Pathology

## 2017-02-16 ENCOUNTER — Ambulatory Visit (INDEPENDENT_AMBULATORY_CARE_PROVIDER_SITE_OTHER): Payer: Medicare PPO | Admitting: Adult Health

## 2017-02-16 ENCOUNTER — Encounter: Payer: Self-pay | Admitting: Adult Health

## 2017-02-16 ENCOUNTER — Encounter: Payer: Medicare PPO | Admitting: Speech Pathology

## 2017-02-16 DIAGNOSIS — R6889 Other general symptoms and signs: Secondary | ICD-10-CM

## 2017-02-16 DIAGNOSIS — R0989 Other specified symptoms and signs involving the circulatory and respiratory systems: Secondary | ICD-10-CM

## 2017-02-16 DIAGNOSIS — J320 Chronic maxillary sinusitis: Secondary | ICD-10-CM

## 2017-02-16 NOTE — Progress Notes (Signed)
@Patient  ID: Amy Vang, female    DOB: 1946-05-25, 71 y.o.   MRN: [id number]  Chief Complaint  Patient presents with  . Follow-up    cough     Referring provider: Carol Ada, MD  HPI: 71 year old female followed with chronic cough , dyspnea , chronic sinusitis s/p surgery (12.2017)   TEST  Spirometry May 2017 showed normal lung function with FEV1 at 94%, ratio 80, FVC 89%, DLCO 64% CT chest May 2017 showed no evidence of pulmonary embolism, mild dependent atelectasis.  02/16/2017 Follow up: Cough  Patient returns for 1 month follow up for chronic cough and throat clearing.. Last visit. Patient was referred to the speech therapy Center at [facility]. She was also started on gabapentin but was unable to tolerate. She says his speech therapy is helping her quite a bit. Cough has decreased in throat swelling has almost resolved. Has noticed some sinus congestion, was seen by ENT this am, tx w/ augmentin .  She remains on saline and flonase and allegra.  Cold air , wind , changes in weather seems to be triggers.      Allergies  Allergen Reactions  . Ceftin [Cefuroxime Axetil]     Lack of Therapeutic Effect  . Hydroxyzine Hcl     unknown  . Lactose Intolerance (Gi)     bloating  . Penicillins Hives    Has patient had a PCN reaction causing immediate rash, facial/tongue/throat swelling, SOB or lightheadedness with hypotension: Yes Has patient had a PCN reaction causing severe rash involving mucus membranes or skin necrosis: Yes Has patient had a PCN reaction that required hospitalization No Has patient had a PCN reaction occurring within the last 10 years: No If all of the above answers are "NO", then may proceed with Cephalosporin use.   [name] [Sulfamethoxazole-Trimethoprim] Rash    Immunization History  Administered Date(s) Administered  . Influenza Split 08/22/2015  . Influenza,inj,Quad PF,36+ Mos 08/30/2016  . Pneumococcal Conjugate-13 09/01/2016     Past Medical History:  Diagnosis Date  . Acid reflux   . Actinic keratosis    Precancerous  . Allergic rhinitis   . Anxiety   . Asthma   . CVA (cerebral infarction)   . Cystocele   . Degenerative disc disease, cervical   . Diastolic dysfunction   . GERD (gastroesophageal reflux disease) PT STATES SEVERE---- CONTROLLED W/ NEXIUM  . History of TIAs AGE 57  AND 2008---  NO RESIDUAL  . Hypertension   . Non-specific gastrointestinal complaint   . OSA (obstructive sleep apnea)    Severe CPAP 12 MM H2O- Dr Radford Pax  . PONV (postoperative nausea and vomiting)   . Pulmonary HTN    Moderate with PASP 51mmHg by echo 03/2016  . Restless leg syndrome   . Rosacea   . SUI (stress urinary incontinence, female)     Tobacco History: History  Smoking Status  . Never Smoker  Smokeless Tobacco  . Never Used   Counseling given: Not Answered   Outpatient Encounter Prescriptions as of 02/16/2017  Medication Sig  . acetaminophen (TYLENOL) 500 MG tablet Take 1,000 mg by mouth every 6 (six) hours as needed for moderate pain.  Marland Kitchen CALCIUM-MAGNESIUM-VITAMIN D PO Take 1 tablet by mouth daily.  . citalopram (CELEXA) 20 MG tablet 10 mg oral daily  . esomeprazole (NEXIUM) 40 MG capsule Take 40 mg by mouth 2 (two) times daily. Brand Name Only  . fexofenadine (ALLEGRA) 60 MG tablet Take 60 mg by mouth  daily.  . fluticasone (FLONASE) 50 MCG/ACT nasal spray Place 2 sprays into both nostrils daily.  . hydrochlorothiazide (HYDRODIURIL) 25 MG tablet Take 25 mg by mouth daily.  . montelukast (SINGULAIR) 10 MG tablet Take 10 mg by mouth daily.   . polyethylene glycol (MIRALAX / GLYCOLAX) packet Take 17 g by mouth daily as needed for mild constipation.   . potassium chloride (MICRO-K) 10 MEQ CR capsule Take 10 mEq by mouth daily.  . Probiotic CAPS Take 1 capsule by mouth daily.  . ranitidine (ZANTAC) 300 MG tablet TAKE 1 TABLET DAILY AS DIRECTED FOR REFLUX (Patient taking differently: take 300 mg oral at  bedtime)  . rOPINIRole (REQUIP) 1 MG tablet Take 3 mg by mouth at bedtime.   . sodium chloride (OCEAN) 0.65 % SOLN nasal spray Place 1 spray into both nostrils 4 (four) times daily.  . [DISCONTINUED] gabapentin (NEURONTIN) 300 MG capsule 300mg  once daily x 5 days, then 300mg  twice daily x 5 days, then 300mg  three times daily to continue   Facility-Administered Encounter Medications as of 02/16/2017  Medication  . triamcinolone acetonide (KENALOG) 10 MG/ML injection 10 mg     Review of Systems  Constitutional:   No  weight loss, night sweats,  Fevers, chills, fatigue, or  lassitude.  HEENT:   No headaches,  Difficulty swallowing,  Tooth/dental problems, or  Sore throat,                No sneezing, itching, ear ache,  +nasal congestion, post nasal drip,   CV:  No chest pain,  Orthopnea, PND, swelling in lower extremities, anasarca, dizziness, palpitations, syncope.   GI  No heartburn, indigestion, abdominal pain, nausea, vomiting, diarrhea, change in bowel habits, loss of appetite, bloody stools.   Resp: No shortness of breath with exertion or at rest.  No excess mucus, no productive cough,  No non-productive cough,  No coughing up of blood.  No change in color of mucus.  No wheezing.  No chest wall deformity  Skin: no rash or lesions.  GU: no dysuria, change in color of urine, no urgency or frequency.  No flank pain, no hematuria   MS:  No joint pain or swelling.  No decreased range of motion.  No back pain.    Physical Exam  BP 116/80   Pulse 82   Ht 5\' 5"  (1.651 m)   Wt 199 lb (90.3 kg)   SpO2 97%   BMI 33.12 kg/m   GEN: A/Ox3; pleasant , NAD, well nourished    HEENT:  [place]/AT,  EACs-clear, TMs-wnl, NOSE-clear drainage , THROAT-clear, no lesions, no postnasal drip or exudate noted.   NECK:  Supple w/ fair ROM; no JVD; normal carotid impulses w/o bruits; no thyromegaly or nodules palpated; no lymphadenopathy.    RESP  Clear  P & A; w/o, wheezes/ rales/ or rhonchi. no  accessory muscle use, no dullness to percussion  CARD:  RRR, no m/r/g, no peripheral edema, pulses intact, no cyanosis or clubbing.  GI:   Soft & nt; nml bowel sounds; no organomegaly or masses detected.   Musco: Warm bil, no deformities or joint swelling noted.   Neuro: alert, no focal deficits noted.    Skin: Warm, no lesions or rashes  Lab Results:  CBC  Imaging: No results found.   Assessment & Plan:   Sinusitis, chronic Mild flare , finish abx as directed.   Chronic throat clearing Cough and throat clearing improved with trigger control  Cont w/  voice therapy exercises .       Rexene Edison, NP 02/16/2017

## 2017-02-16 NOTE — Assessment & Plan Note (Signed)
Cough and throat clearing improved with trigger control  Cont w/ voice therapy exercises .

## 2017-02-16 NOTE — Patient Instructions (Signed)
So glad your are doing better.  Continue with voice /throat exercises.  Continue on current regimen  follow up Dr. Chase Caller in 4-6 months and As needed

## 2017-02-16 NOTE — Assessment & Plan Note (Signed)
Mild flare , finish abx as directed.

## 2017-02-22 ENCOUNTER — Ambulatory Visit: Payer: Medicare PPO

## 2017-02-25 ENCOUNTER — Encounter: Payer: Medicare PPO | Admitting: Speech Pathology

## 2017-03-01 ENCOUNTER — Encounter: Payer: Medicare PPO | Admitting: Speech Pathology

## 2017-03-03 ENCOUNTER — Other Ambulatory Visit: Payer: Self-pay | Admitting: Family Medicine

## 2017-03-03 DIAGNOSIS — N6311 Unspecified lump in the right breast, upper outer quadrant: Secondary | ICD-10-CM

## 2017-03-08 ENCOUNTER — Other Ambulatory Visit: Payer: Medicare PPO

## 2017-03-16 ENCOUNTER — Ambulatory Visit
Admission: RE | Admit: 2017-03-16 | Discharge: 2017-03-16 | Disposition: A | Discharge status: Home / Self Care | Payer: Medicare PPO | Source: Ambulatory Visit | Attending: Family Medicine | Admitting: Family Medicine

## 2017-03-16 DIAGNOSIS — N6311 Unspecified lump in the right breast, upper outer quadrant: Secondary | ICD-10-CM

## 2017-03-16 HISTORY — DX: Unspecified lump in unspecified breast: N63.0

## 2017-05-31 ENCOUNTER — Telehealth: Payer: Self-pay | Admitting: Cardiology

## 2017-05-31 NOTE — Telephone Encounter (Signed)
New message   Pt states that she is eligible for a new CPAP machine and wants an Rx for one. Requests a call back

## 2017-06-01 NOTE — Telephone Encounter (Signed)
She will ned an office visit before we can order it

## 2017-06-01 NOTE — Telephone Encounter (Signed)
Patient's last office visit was 04/22/2016. Patient's machine is 70 years old. Per [facility] she is eligible for a new CPAPmachine.

## 2017-06-24 ENCOUNTER — Ambulatory Visit: Payer: Medicare PPO | Admitting: Internal Medicine

## 2017-07-09 NOTE — Telephone Encounter (Signed)
Reached out to the patient and informed her she needs an office visit with Dr Radford Pax because she has not been seen since 04/22/16 and then she will get a new prescription for a new CPAP. Patient states she has to care for her husband who just had knee surgery so she will call our office back and make an appointment with Dr Radford Pax once he is back on his feet.

## 2017-08-05 ENCOUNTER — Ambulatory Visit: Payer: Medicare PPO | Admitting: Internal Medicine

## 2017-08-31 DIAGNOSIS — M542 Cervicalgia: Secondary | ICD-10-CM | POA: Insufficient documentation

## 2017-08-31 DIAGNOSIS — M5020 Other cervical disc displacement, unspecified cervical region: Secondary | ICD-10-CM | POA: Insufficient documentation

## 2017-09-07 ENCOUNTER — Other Ambulatory Visit: Payer: Self-pay | Admitting: Neurosurgery

## 2017-09-22 ENCOUNTER — Inpatient Hospital Stay (HOSPITAL_COMMUNITY): Admission: RE | Admit: 2017-09-22 | Payer: Medicare PPO | Source: Ambulatory Visit

## 2017-10-18 ENCOUNTER — Encounter (HOSPITAL_COMMUNITY)
Admission: RE | Admit: 2017-10-18 | Discharge: 2017-10-18 | Disposition: A | Discharge status: Home / Self Care | Payer: Medicare PPO | Source: Ambulatory Visit | Attending: Neurosurgery | Admitting: Neurosurgery

## 2017-10-18 ENCOUNTER — Encounter (HOSPITAL_COMMUNITY): Payer: Self-pay

## 2017-10-18 ENCOUNTER — Other Ambulatory Visit: Payer: Self-pay

## 2017-10-18 ENCOUNTER — Other Ambulatory Visit (HOSPITAL_COMMUNITY): Payer: Medicare PPO

## 2017-10-18 ENCOUNTER — Ambulatory Visit (HOSPITAL_COMMUNITY)
Admission: RE | Admit: 2017-10-18 | Discharge: 2017-10-18 | Disposition: A | Discharge status: Home / Self Care | Payer: Medicare PPO | Source: Ambulatory Visit | Attending: Anesthesiology | Admitting: Anesthesiology

## 2017-10-18 DIAGNOSIS — Z0183 Encounter for blood typing: Secondary | ICD-10-CM | POA: Insufficient documentation

## 2017-10-18 DIAGNOSIS — J45909 Unspecified asthma, uncomplicated: Secondary | ICD-10-CM | POA: Insufficient documentation

## 2017-10-18 DIAGNOSIS — R9431 Abnormal electrocardiogram [ECG] [EKG]: Secondary | ICD-10-CM | POA: Insufficient documentation

## 2017-10-18 DIAGNOSIS — Z8673 Personal history of transient ischemic attack (TIA), and cerebral infarction without residual deficits: Secondary | ICD-10-CM | POA: Diagnosis not present

## 2017-10-18 DIAGNOSIS — R05 Cough: Secondary | ICD-10-CM | POA: Insufficient documentation

## 2017-10-18 DIAGNOSIS — K219 Gastro-esophageal reflux disease without esophagitis: Secondary | ICD-10-CM | POA: Diagnosis not present

## 2017-10-18 DIAGNOSIS — M542 Cervicalgia: Secondary | ICD-10-CM | POA: Diagnosis not present

## 2017-10-18 DIAGNOSIS — Z01812 Encounter for preprocedural laboratory examination: Secondary | ICD-10-CM | POA: Diagnosis not present

## 2017-10-18 DIAGNOSIS — Z01818 Encounter for other preprocedural examination: Secondary | ICD-10-CM | POA: Insufficient documentation

## 2017-10-18 DIAGNOSIS — G4733 Obstructive sleep apnea (adult) (pediatric): Secondary | ICD-10-CM | POA: Insufficient documentation

## 2017-10-18 DIAGNOSIS — I10 Essential (primary) hypertension: Secondary | ICD-10-CM | POA: Insufficient documentation

## 2017-10-18 DIAGNOSIS — Z79899 Other long term (current) drug therapy: Secondary | ICD-10-CM | POA: Insufficient documentation

## 2017-10-18 DIAGNOSIS — R059 Cough, unspecified: Secondary | ICD-10-CM

## 2017-10-18 HISTORY — DX: Personal history of other diseases of the digestive system: Z87.19

## 2017-10-18 HISTORY — DX: Pneumonia, unspecified organism: J18.9

## 2017-10-18 HISTORY — DX: Cerebral infarction, unspecified: I63.9

## 2017-10-18 LAB — BASIC METABOLIC PANEL
ANION GAP: 6 (ref 5–15)
BUN: 9 mg/dL (ref 6–20)
CHLORIDE: 103 mmol/L (ref 101–111)
CO2: 29 mmol/L (ref 22–32)
Calcium: 8.9 mg/dL (ref 8.9–10.3)
Creatinine, Ser: 0.68 mg/dL (ref 0.44–1.00)
GFR calc Af Amer: >60 mL/min (ref >60)
GLUCOSE: 85 mg/dL (ref 65–99)
POTASSIUM: 3.6 mmol/L (ref 3.5–5.1)
Sodium: 138 mmol/L (ref 135–145)

## 2017-10-18 LAB — CBC
HCT: 36.5 % (ref 36.0–46.0)
HEMOGLOBIN: 12.4 g/dL (ref 12.0–15.0)
MCH: 30.7 pg (ref 26.0–34.0)
MCHC: 34 g/dL (ref 30.0–36.0)
MCV: 90.3 fL (ref 78.0–100.0)
PLATELETS: 203 10*3/uL (ref 150–400)
RBC: 4.04 MIL/uL (ref 3.87–5.11)
RDW: 12.7 % (ref 11.5–15.5)
WBC: 6.4 10*3/uL (ref 4.0–10.5)

## 2017-10-18 LAB — TYPE AND SCREEN
ABO/RH(D): O POS
Antibody Screen: NEGATIVE

## 2017-10-18 LAB — ABO/RH: ABO/RH(D): O POS

## 2017-10-18 LAB — SURGICAL PCR SCREEN
MRSA, PCR: NEGATIVE
Staphylococcus aureus: NEGATIVE

## 2017-10-18 NOTE — Pre-Procedure Instructions (Signed)
Amy Vang [facility]  10/18/2017      [id number] [place] [place] - [address] [place] [address] Phone: [phone] Fax: [phone]  [facility] [place], [place] [address] Phone: [phone] Fax: [phone]  [facility] [place], [place] [address] [place] [address] Phone: [phone] Fax: [phone]    Your procedure is scheduled on 10/25/17  Report to [facility] at 530 A.M.  Call this number if you have problems the morning of surgery:  [phone]   Remember:  Do not eat food or drink liquids after midnight.  Take these medicines the morning of surgery with A SIP OF WATER      Citalopram(celexa),nexium,fexafenadine(allegra) Ranitidine(zantac),ocycodone if needed,valium if needed  STOP all herbel meds, nsaids (aleve,naproxen,advil,ibuprofen)7 days prior to surgery starting today 10/18/17 including all vitamins/supplements,aspirin, fish oil   Do not wear jewelry, make-up or nail polish.  Do not wear lotions, powders, or perfumes, or deoderant.  Do not shave 48 hours prior to surgery.  Men may shave face and neck.  Do not bring valuables to the hospital.  [facility] is not responsible for any belongings or valuables.  Contacts, dentures or bridgework may not be worn into surgery.  Leave your suitcase in the car.  After surgery it may be brought to your room.  For patients admitted to the hospital, discharge time will be determined by your treatment team.  Patients discharged the day of surgery will not be allowed to drive home.   Special instructions: Special Instructions: [place] - Preparing for Surgery  Before surgery, you can play an important role.  Because skin is not sterile, your skin needs to be as free of germs as possible.  You can reduce the number of germs on you skin by  washing with CHG (chlorahexidine gluconate) soap before surgery.  CHG is an antiseptic cleaner which kills germs and bonds with the skin to continue killing germs even after washing.  Please DO NOT use if you have an allergy to CHG or antibacterial soaps.  If your skin becomes reddened/irritated stop using the CHG and inform your nurse when you arrive at Short Stay.  Do not shave (including legs and underarms) for at least 48 hours prior to the first CHG shower.  You may shave your face.  Please follow these instructions carefully:   1.  Shower with CHG Soap the night before surgery and the morning of Surgery.  2.  If you choose to wash your hair, wash your hair first as usual with your normal shampoo.  3.  After you shampoo, rinse your hair and body thoroughly to remove the Shampoo.  4.  Use CHG as you would any other liquid soap.  You can apply chg directly  to the skin and wash gently with scrungie or a clean washcloth.  5.  Apply the CHG Soap to your body ONLY FROM THE NECK DOWN.  Do not use on open wounds or open sores.  Avoid contact with your eyes ears, mouth and genitals (private parts).  Wash genitals (private parts)       with your normal soap.  6.  Wash thoroughly, paying special attention to the area where your surgery will be performed.  7.  Thoroughly rinse your body with warm water from the  neck down.  8.  DO NOT shower/wash with your normal soap after using and rinsing off the CHG Soap.  9.  Pat yourself dry with a clean towel.            10.  Wear clean pajamas.            11.  Place clean sheets on your bed the night of your first shower and do not sleep with pets.  Day of Surgery  Do not apply any lotions/deodorants the morning of surgery.  Please wear clean clothes to the hospital/surgery center.  Please read over the  fact sheets that you were given.

## 2017-10-18 NOTE — Pre-Procedure Instructions (Signed)
Teah Votaw [facility]  10/18/2017      [id number] [place], [place] - [address] [place] [address] Phone: [phone] Fax: [phone]  [facility] [place], [place] [address] Phone: [phone] Fax: [phone]  [facility] [place], [place] [address] [place] [address] Phone: [phone] Fax: [phone]    Your procedure is scheduled on 10/25/17  Report to [place] at 530 A.M.  Call this number if you have problems the morning of surgery:  [phone]   Remember:  Do not eat food or drink liquids after midnight.  Take these medicines the morning of surgery with A SIP OF WATER      Citalopram(celexa),nexium,fexafenadine(allegra) Ranitidine(zantac),ocycodone if needed,valium if needed  STOP all herbel meds, nsaids (aleve,naproxen,advil,ibuprofen)7 days prior to surgery starting today 10/18/17 including all vitamins/supplements,aspirin, fish oil   Do not wear jewelry, make-up or nail polish.  Do not wear lotions, powders, or perfumes, or deoderant.  Do not shave 48 hours prior to surgery.  Men may shave face and neck.  Do not bring valuables to the hospital.  [facility] is not responsible for any belongings or valuables.  Contacts, dentures or bridgework may not be worn into surgery.  Leave your suitcase in the car.  After surgery it may be brought to your room.  For patients admitted to the hospital, discharge time will be determined by your treatment team.  Patients discharged the day of surgery will not be allowed to drive home.   Special instructions: Special Instructions: [place] - Preparing for Surgery  Before surgery, you can play an important role.  Because skin is not sterile, your skin needs to be as free of germs as possible.  You can reduce the number of germs on you skin by  washing with CHG (chlorahexidine gluconate) soap before surgery.  CHG is an antiseptic cleaner which kills germs and bonds with the skin to continue killing germs even after washing.  Please DO NOT use if you have an allergy to CHG or antibacterial soaps.  If your skin becomes reddened/irritated stop using the CHG and inform your nurse when you arrive at Short Stay.  Do not shave (including legs and underarms) for at least 48 hours prior to the first CHG shower.  You may shave your face.  Please follow these instructions carefully:   1.  Shower with CHG Soap the night before surgery and the morning of Surgery.  2.  If you choose to wash your hair, wash your hair first as usual with your normal shampoo.  3.  After you shampoo, rinse your hair and body thoroughly to remove the Shampoo.  4.  Use CHG as you would any other liquid soap.  You can apply chg directly  to the skin and wash gently with scrungie or a clean washcloth.  5.  Apply the CHG Soap to your body ONLY FROM THE NECK DOWN.  Do not use on open wounds or open sores.  Avoid contact with your eyes ears, mouth and genitals (private parts).  Wash genitals (private parts)       with your normal soap.  6.  Wash thoroughly, paying special attention to the area where your surgery will be performed.  7.  Thoroughly rinse your body with warm water from the  neck down.  8.  DO NOT shower/wash with your normal soap after using and rinsing off the CHG Soap.  9.  Pat yourself dry with a clean towel.            10.  Wear clean pajamas.            11.  Place clean sheets on your bed the night of your first shower and do not sleep with pets.  Day of Surgery  Do not apply any lotions/deodorants the morning of surgery.  Please wear clean clothes to the hospital/surgery center.  Please read over the  fact sheets that you were given.

## 2017-10-19 NOTE — Progress Notes (Signed)
Anesthesia Chart Review:  Pt is a 71 year old female scheduled for C6-7 ACDF, interbody prosthesis and anterior plating 10/25/2017 with Newman Pies, M.D.  [facility] includes: stroke, HTN, pulmonary hypertension (none by R heart cath 0/34/74), diastolic dysfunction, OSA, asthma, post-op N/V, GERD. Never smoker. BMI 32. S/p nasal septoplasty 11/18/16. S/p cystocele repair 12/11/11.   Medications include: Nexium, HCTZ, potassium, Zantac  BP (!) 146/58   Pulse 61   Temp 36.6 C   Resp 20   Ht 5\' 5"  (1.651 m)   Wt 190 lb 6.4 oz (86.4 kg)   SpO2 96%   BMI 31.68 kg/m    Preoperative labs reviewed.    CXR 10/18/17: No active cardiopulmonary disease.  Aortic atherosclerosis.  EKG 10/18/17: NSR. ST & T wave abnormality, consider lateral ischemia. Appears similar to EKG 06/26/16  Echo 11/04/16:  - Left ventricle: The cavity size was normal. Wall thickness was increased in a pattern of mild LVH. Systolic function was normal. The estimated ejection fraction was in the range of 60% to 65%. Wall motion was normal; there were no regional wall motion abnormalities. Doppler parameters are consistent with abnormal left ventricular relaxation (grade 1 diastolic dysfunction). - Aortic valve: There was no stenosis. - Mitral valve: There was trivial regurgitation. - Right ventricle: The cavity size was mildly dilated. Systolic function was normal. - Tricuspid valve: Peak RV-RA gradient (S): 29 mm Hg. - Pulmonary arteries: PA peak pressure: 32 mm Hg (S). - Inferior vena cava: The vessel was normal in size. The respirophasic diameter changes were in the normal range (= 50%), consistent with normal central venous pressure. - Pericardium, extracardiac: A trivial pericardial effusion was identified. - Impressions: Normal LV size with mild LV hypertrophy. EF 60-65%. Mildly dilated RV with normal systolic function. No significant valvular abnormalities.  R cardiac cath 08/13/16:  Hemodynamics (mmHg) RA mean 2 RV  33/6 PA 34/16, mean 22 PCWP mean 9  Oxygen saturations: PA 63% AO 93%  Cardiac Output (Fick) 5.22  Cardiac Index (Fick) 2.72  Conclusion: Normal study.  No evidence for pulmonary hypertension and normal filling pressures.    Cardiopulmonary exercise test 05/26/16:  - Exercise testing with gas-exchange demonstrates normal functional capacity when compared to matched sedentary norms. There is no evidence of cardiopulmonary limitation or exercise-induced bronchospasm. Patient appears primarily limited due to her body habitus. There was also chronotropic incompetence.  Nuclear stress test 04/30/16:   Nuclear stress EF: 72%.  There was no ST segment deviation noted during stress.  This is a low risk study.  The left ventricular ejection fraction is hyperdynamic (>65%).  Reviewed EKG with Dr. Gifford Shave.   If no changes, I anticipate pt can proceed with surgery as scheduled.   Willeen Cass, FNP-BC [facility] Short Stay Surgical Center/Anesthesiology Phone: [phone] 10/19/2017 4:27 PM

## 2017-10-22 MED ORDER — VANCOMYCIN HCL IN DEXTROSE 1-5 GM/200ML-% IV SOLN
1000.0000 mg | INTRAVENOUS | Status: AC
  Administered 2017-10-25: 1000 mg via INTRAVENOUS
  Filled 2017-10-22: qty 200

## 2017-10-24 NOTE — Anesthesia Preprocedure Evaluation (Addendum)
Anesthesia Evaluation  Patient identified by MRN, date of birth, ID band Patient awake    Reviewed: Allergy & Precautions, NPO status , Patient's Chart, lab work & pertinent test results  History of Anesthesia Complications (+) PONV  Airway Mallampati: III  TM Distance: >3 FB Neck ROM: Full    Dental  (+) Teeth Intact, Dental Advisory Given   Pulmonary asthma , sleep apnea ,    Pulmonary exam normal        Cardiovascular hypertension, Pt. on medications  Rhythm:Regular Rate:Normal  10/2016 ECHO: Normal LV size with mild LV hypertrophy. EF 60-65%. Mildly  dilated RV with normal systolic function. No significant valvular  abnormalities.   Neuro/Psych CVA    GI/Hepatic Neg liver ROS, hiatal hernia, GERD  Medicated,  Endo/Other  negative endocrine ROS  Renal/GU negative Renal ROS     Musculoskeletal  (+) Arthritis ,   Abdominal   Peds  Hematology negative hematology ROS (+)   Anesthesia Other Findings   Reproductive/Obstetrics                           Lab Results  Component Value Date   WBC 6.4 10/18/2017   HGB 12.4 10/18/2017   HCT 36.5 10/18/2017   MCV 90.3 10/18/2017   PLT 203 10/18/2017   Lab Results  Component Value Date   CREATININE 0.68 10/18/2017   BUN 9 10/18/2017   NA 138 10/18/2017   K 3.6 10/18/2017   CL 103 10/18/2017   CO2 29 10/18/2017    Anesthesia Physical Anesthesia Plan  ASA: III  Anesthesia Plan: General   Post-op Pain Management:    Induction: Intravenous  PONV Risk Score and Plan: 4 or greater and Propofol infusion, Dexamethasone, Ondansetron, Treatment may vary due to age or medical condition, Metaclopromide and Midazolam  Airway Management Planned: Oral ETT  Additional Equipment:   Intra-op Plan:   Post-operative Plan: Extubation in OR and Possible Post-op intubation/ventilation  Informed Consent: I have reviewed the patients History and  Physical, chart, labs and discussed the procedure including the risks, benefits and alternatives for the proposed anesthesia with the patient or authorized representative who has indicated his/her understanding and acceptance.   Dental advisory given  Plan Discussed with: CRNA  Anesthesia Plan Comments:        Anesthesia Quick Evaluation

## 2017-10-25 ENCOUNTER — Ambulatory Visit (HOSPITAL_COMMUNITY): Payer: Medicare PPO

## 2017-10-25 ENCOUNTER — Ambulatory Visit (HOSPITAL_COMMUNITY): Payer: Medicare PPO | Admitting: Vascular Surgery

## 2017-10-25 ENCOUNTER — Ambulatory Visit (HOSPITAL_COMMUNITY): Payer: Medicare PPO | Admitting: Anesthesiology

## 2017-10-25 ENCOUNTER — Ambulatory Visit (HOSPITAL_COMMUNITY)
Admission: RE | Admit: 2017-10-25 | Discharge: 2017-10-25 | Disposition: A | Discharge status: Home / Self Care | Payer: Medicare PPO | Source: Ambulatory Visit | Attending: Neurosurgery | Admitting: Neurosurgery

## 2017-10-25 ENCOUNTER — Encounter (HOSPITAL_COMMUNITY)
Admission: RE | Disposition: A | Discharge status: Home / Self Care | Payer: Self-pay | Source: Ambulatory Visit | Attending: Neurosurgery

## 2017-10-25 DIAGNOSIS — M50123 Cervical disc disorder at C6-C7 level with radiculopathy: Secondary | ICD-10-CM | POA: Insufficient documentation

## 2017-10-25 DIAGNOSIS — J309 Allergic rhinitis, unspecified: Secondary | ICD-10-CM | POA: Insufficient documentation

## 2017-10-25 DIAGNOSIS — M4722 Other spondylosis with radiculopathy, cervical region: Secondary | ICD-10-CM | POA: Diagnosis present

## 2017-10-25 DIAGNOSIS — Z9989 Dependence on other enabling machines and devices: Secondary | ICD-10-CM | POA: Diagnosis not present

## 2017-10-25 DIAGNOSIS — F419 Anxiety disorder, unspecified: Secondary | ICD-10-CM | POA: Diagnosis not present

## 2017-10-25 DIAGNOSIS — M479 Spondylosis, unspecified: Secondary | ICD-10-CM | POA: Insufficient documentation

## 2017-10-25 DIAGNOSIS — Z7951 Long term (current) use of inhaled steroids: Secondary | ICD-10-CM | POA: Insufficient documentation

## 2017-10-25 DIAGNOSIS — K219 Gastro-esophageal reflux disease without esophagitis: Secondary | ICD-10-CM | POA: Diagnosis not present

## 2017-10-25 DIAGNOSIS — Z79899 Other long term (current) drug therapy: Secondary | ICD-10-CM | POA: Diagnosis not present

## 2017-10-25 DIAGNOSIS — I10 Essential (primary) hypertension: Secondary | ICD-10-CM | POA: Diagnosis not present

## 2017-10-25 DIAGNOSIS — Z8673 Personal history of transient ischemic attack (TIA), and cerebral infarction without residual deficits: Secondary | ICD-10-CM | POA: Insufficient documentation

## 2017-10-25 DIAGNOSIS — G4733 Obstructive sleep apnea (adult) (pediatric): Secondary | ICD-10-CM | POA: Diagnosis not present

## 2017-10-25 DIAGNOSIS — Z419 Encounter for procedure for purposes other than remedying health state, unspecified: Secondary | ICD-10-CM

## 2017-10-25 DIAGNOSIS — M542 Cervicalgia: Secondary | ICD-10-CM | POA: Diagnosis present

## 2017-10-25 HISTORY — PX: ANTERIOR CERVICAL DECOMP/DISCECTOMY FUSION: SHX1161

## 2017-10-25 SURGERY — ANTERIOR CERVICAL DECOMPRESSION/DISCECTOMY FUSION 1 LEVEL
Anesthesia: General

## 2017-10-25 MED ORDER — OXYCODONE-ACETAMINOPHEN 5-325 MG PO TABS
1.0000 | ORAL_TABLET | ORAL | Status: DC | PRN
  Administered 2017-10-25 (×2): 1 via ORAL
  Filled 2017-10-25 (×2): qty 1

## 2017-10-25 MED ORDER — LIDOCAINE HCL (CARDIAC) 20 MG/ML IV SOLN
INTRAVENOUS | Status: DC | PRN
  Administered 2017-10-25: 60 mg via INTRAVENOUS

## 2017-10-25 MED ORDER — ZOLPIDEM TARTRATE 5 MG PO TABS: 5.0000 mg | ORAL_TABLET | Freq: Every evening | ORAL | Status: DC | PRN

## 2017-10-25 MED ORDER — PROPOFOL 10 MG/ML IV BOLUS
INTRAVENOUS | Status: AC
  Filled 2017-10-25: qty 20

## 2017-10-25 MED ORDER — DEXAMETHASONE SODIUM PHOSPHATE 10 MG/ML IJ SOLN
INTRAMUSCULAR | Status: DC | PRN
  Administered 2017-10-25: 10 mg via INTRAVENOUS

## 2017-10-25 MED ORDER — LACTATED RINGERS IV SOLN: INTRAVENOUS | Status: DC

## 2017-10-25 MED ORDER — CHLORHEXIDINE GLUCONATE CLOTH 2 % EX PADS: 6.0000 | MEDICATED_PAD | Freq: Once | CUTANEOUS | Status: DC

## 2017-10-25 MED ORDER — ONDANSETRON HCL 4 MG/2ML IJ SOLN
INTRAMUSCULAR | Status: AC
  Filled 2017-10-25: qty 2

## 2017-10-25 MED ORDER — METOCLOPRAMIDE HCL 5 MG/ML IJ SOLN
INTRAMUSCULAR | Status: DC | PRN
  Administered 2017-10-25: 10 mg via INTRAVENOUS

## 2017-10-25 MED ORDER — FLUTICASONE PROPIONATE 50 MCG/ACT NA SUSP
2.0000 | Freq: Two times a day (BID) | NASAL | Status: DC
  Filled 2017-10-25: qty 16

## 2017-10-25 MED ORDER — PANTOPRAZOLE SODIUM 40 MG PO TBEC: 80.0000 mg | DELAYED_RELEASE_TABLET | Freq: Every day | ORAL | Status: DC

## 2017-10-25 MED ORDER — PROPOFOL 500 MG/50ML IV EMUL
INTRAVENOUS | Status: DC | PRN
  Administered 2017-10-25: 10 ug/kg/min via INTRAVENOUS

## 2017-10-25 MED ORDER — BACITRACIN ZINC 500 UNIT/GM EX OINT
TOPICAL_OINTMENT | CUTANEOUS | Status: DC | PRN
  Administered 2017-10-25: 1 via TOPICAL

## 2017-10-25 MED ORDER — SUGAMMADEX SODIUM 200 MG/2ML IV SOLN
INTRAVENOUS | Status: DC | PRN
  Administered 2017-10-25: 200 mg via INTRAVENOUS

## 2017-10-25 MED ORDER — ONDANSETRON HCL 4 MG/2ML IJ SOLN: 4.0000 mg | Freq: Four times a day (QID) | INTRAMUSCULAR | Status: DC | PRN

## 2017-10-25 MED ORDER — FAMOTIDINE 20 MG PO TABS: 20.0000 mg | ORAL_TABLET | Freq: Two times a day (BID) | ORAL | Status: DC

## 2017-10-25 MED ORDER — CITALOPRAM HYDROBROMIDE 10 MG PO TABS
10.0000 mg | ORAL_TABLET | Freq: Every day | ORAL | Status: DC
  Filled 2017-10-25: qty 1

## 2017-10-25 MED ORDER — LACTATED RINGERS IV SOLN
INTRAVENOUS | Status: DC | PRN
  Administered 2017-10-25 (×2): via INTRAVENOUS

## 2017-10-25 MED ORDER — PHENOL 1.4 % MT LIQD: 1.0000 | OROMUCOSAL | Status: DC | PRN

## 2017-10-25 MED ORDER — FENTANYL CITRATE (PF) 100 MCG/2ML IJ SOLN
INTRAMUSCULAR | Status: AC
  Administered 2017-10-25: 25 ug via INTRAVENOUS
  Filled 2017-10-25: qty 2

## 2017-10-25 MED ORDER — BACITRACIN ZINC 500 UNIT/GM EX OINT
TOPICAL_OINTMENT | CUTANEOUS | Status: AC
  Filled 2017-10-25: qty 28.35

## 2017-10-25 MED ORDER — DEXAMETHASONE SODIUM PHOSPHATE 4 MG/ML IJ SOLN: 4.0000 mg | Freq: Four times a day (QID) | INTRAMUSCULAR | Status: DC

## 2017-10-25 MED ORDER — SALINE SPRAY 0.65 % NA SOLN: 1.0000 | Freq: Four times a day (QID) | NASAL | Status: DC | PRN

## 2017-10-25 MED ORDER — PROPOFOL 10 MG/ML IV BOLUS
INTRAVENOUS | Status: DC | PRN
  Administered 2017-10-25: 150 mg via INTRAVENOUS
  Administered 2017-10-25: 20 mg via INTRAVENOUS

## 2017-10-25 MED ORDER — MIDAZOLAM HCL 2 MG/2ML IJ SOLN
INTRAMUSCULAR | Status: AC
  Filled 2017-10-25: qty 2

## 2017-10-25 MED ORDER — ROCURONIUM BROMIDE 100 MG/10ML IV SOLN
INTRAVENOUS | Status: DC | PRN
  Administered 2017-10-25: 10 mg via INTRAVENOUS
  Administered 2017-10-25: 50 mg via INTRAVENOUS

## 2017-10-25 MED ORDER — ONDANSETRON HCL 4 MG/2ML IJ SOLN
INTRAMUSCULAR | Status: DC | PRN
  Administered 2017-10-25 (×2): 4 mg via INTRAVENOUS

## 2017-10-25 MED ORDER — PROMETHAZINE HCL 25 MG/ML IJ SOLN: 6.2500 mg | INTRAMUSCULAR | Status: DC | PRN

## 2017-10-25 MED ORDER — TRAZODONE HCL 50 MG PO TABS
50.0000 mg | ORAL_TABLET | Freq: Every day | ORAL | Status: DC
  Filled 2017-10-25: qty 1

## 2017-10-25 MED ORDER — BISACODYL 10 MG RE SUPP: 10.0000 mg | Freq: Every day | RECTAL | Status: DC | PRN

## 2017-10-25 MED ORDER — SODIUM CHLORIDE 0.9 % IR SOLN
Status: DC | PRN
  Administered 2017-10-25: 08:00:00

## 2017-10-25 MED ORDER — LORATADINE 10 MG PO TABS: 10.0000 mg | ORAL_TABLET | Freq: Every day | ORAL | Status: DC

## 2017-10-25 MED ORDER — DEXAMETHASONE 4 MG PO TABS
4.0000 mg | ORAL_TABLET | Freq: Four times a day (QID) | ORAL | Status: DC
  Administered 2017-10-25: 4 mg via ORAL
  Filled 2017-10-25: qty 1

## 2017-10-25 MED ORDER — DEXTROSE 5 % IV SOLN
INTRAVENOUS | Status: DC | PRN
  Administered 2017-10-25: 20 ug/min via INTRAVENOUS

## 2017-10-25 MED ORDER — 0.9 % SODIUM CHLORIDE (POUR BTL) OPTIME
TOPICAL | Status: DC | PRN
  Administered 2017-10-25: 1000 mL

## 2017-10-25 MED ORDER — DOCUSATE SODIUM 100 MG PO CAPS: 100.0000 mg | ORAL_CAPSULE | Freq: Two times a day (BID) | ORAL | Status: DC

## 2017-10-25 MED ORDER — CYCLOBENZAPRINE HCL 10 MG PO TABS: 10.0000 mg | ORAL_TABLET | Freq: Three times a day (TID) | ORAL | Status: DC | PRN

## 2017-10-25 MED ORDER — MENTHOL 3 MG MT LOZG: 1.0000 | LOZENGE | OROMUCOSAL | Status: DC | PRN

## 2017-10-25 MED ORDER — FENTANYL CITRATE (PF) 250 MCG/5ML IJ SOLN
INTRAMUSCULAR | Status: AC
  Filled 2017-10-25: qty 5

## 2017-10-25 MED ORDER — ACETAMINOPHEN 650 MG RE SUPP: 650.0000 mg | RECTAL | Status: DC | PRN

## 2017-10-25 MED ORDER — THROMBIN (RECOMBINANT) 5000 UNITS EX SOLR
CUTANEOUS | Status: AC
  Filled 2017-10-25: qty 15000

## 2017-10-25 MED ORDER — DOCUSATE SODIUM 100 MG PO CAPS: 100.0000 mg | ORAL_CAPSULE | Freq: Two times a day (BID) | ORAL | 0 refills | Status: DC

## 2017-10-25 MED ORDER — VANCOMYCIN HCL IN DEXTROSE 1-5 GM/200ML-% IV SOLN: 1000.0000 mg | Freq: Once | INTRAVENOUS | Status: DC

## 2017-10-25 MED ORDER — BUPIVACAINE-EPINEPHRINE (PF) 0.5% -1:200000 IJ SOLN
INTRAMUSCULAR | Status: AC
  Filled 2017-10-25: qty 30

## 2017-10-25 MED ORDER — THROMBIN (RECOMBINANT) 5000 UNITS EX SOLR
OROMUCOSAL | Status: DC | PRN
  Administered 2017-10-25: 08:00:00 via TOPICAL

## 2017-10-25 MED ORDER — POLYETHYLENE GLYCOL 3350 17 G PO PACK: 17.0000 g | PACK | Freq: Every day | ORAL | Status: DC | PRN

## 2017-10-25 MED ORDER — BUPIVACAINE-EPINEPHRINE (PF) 0.5% -1:200000 IJ SOLN
INTRAMUSCULAR | Status: DC | PRN
  Administered 2017-10-25: 10 mL via PERINEURAL

## 2017-10-25 MED ORDER — FENTANYL CITRATE (PF) 100 MCG/2ML IJ SOLN
INTRAMUSCULAR | Status: DC | PRN
  Administered 2017-10-25 (×3): 50 ug via INTRAVENOUS

## 2017-10-25 MED ORDER — ACETAMINOPHEN 325 MG PO TABS: 650.0000 mg | ORAL_TABLET | ORAL | Status: DC | PRN

## 2017-10-25 MED ORDER — ALUM & MAG HYDROXIDE-SIMETH 200-200-20 MG/5ML PO SUSP: 30.0000 mL | Freq: Four times a day (QID) | ORAL | Status: DC | PRN

## 2017-10-25 MED ORDER — HYDROCHLOROTHIAZIDE 25 MG PO TABS: 25.0000 mg | ORAL_TABLET | Freq: Every day | ORAL | Status: DC

## 2017-10-25 MED ORDER — FENTANYL CITRATE (PF) 100 MCG/2ML IJ SOLN
25.0000 ug | INTRAMUSCULAR | Status: DC | PRN
  Administered 2017-10-25 (×2): 25 ug via INTRAVENOUS

## 2017-10-25 MED ORDER — POTASSIUM CHLORIDE CRYS ER 10 MEQ PO TBCR: 10.0000 meq | EXTENDED_RELEASE_TABLET | Freq: Every day | ORAL | Status: DC

## 2017-10-25 MED ORDER — ONDANSETRON HCL 4 MG PO TABS: 4.0000 mg | ORAL_TABLET | Freq: Four times a day (QID) | ORAL | Status: DC | PRN

## 2017-10-25 MED ORDER — MIDAZOLAM HCL 5 MG/5ML IJ SOLN
INTRAMUSCULAR | Status: DC | PRN
  Administered 2017-10-25 (×2): 1 mg via INTRAVENOUS

## 2017-10-25 MED ORDER — OXYCODONE-ACETAMINOPHEN 5-325 MG PO TABS: 1.0000 | ORAL_TABLET | ORAL | 0 refills | Status: DC | PRN

## 2017-10-25 MED ORDER — CYCLOBENZAPRINE HCL 10 MG PO TABS: 10.0000 mg | ORAL_TABLET | Freq: Three times a day (TID) | ORAL | 1 refills | Status: AC | PRN

## 2017-10-25 SURGICAL SUPPLY — 59 items
APL SKNCLS STERI-STRIP NONHPOA (GAUZE/BANDAGES/DRESSINGS) ×1
BAG DECANTER FOR FLEXI CONT (MISCELLANEOUS) ×2 IMPLANT
BENZOIN TINCTURE PRP APPL 2/3 (GAUZE/BANDAGES/DRESSINGS) ×2 IMPLANT
BIT DRILL NEURO 2X3.1 SFT TUCH (MISCELLANEOUS) ×1 IMPLANT
BLADE SURG 15 STRL LF DISP TIS (BLADE) ×1 IMPLANT
BLADE SURG 15 STRL SS (BLADE) ×2
BLADE ULTRA TIP 2M (BLADE) ×2 IMPLANT
BUR BARREL STRAIGHT FLUTE 4.0 (BURR) ×2 IMPLANT
BUR MATCHSTICK NEURO 3.0 LAGG (BURR) ×2 IMPLANT
CANISTER SUCT 3000ML PPV (MISCELLANEOUS) ×2 IMPLANT
CARTRIDGE OIL MAESTRO DRILL (MISCELLANEOUS) ×1 IMPLANT
COVER MAYO STAND STRL (DRAPES) ×2 IMPLANT
DIFFUSER DRILL AIR PNEUMATIC (MISCELLANEOUS) ×2 IMPLANT
DRAPE HALF SHEET 40X57 (DRAPES) ×1 IMPLANT
DRAPE LAPAROTOMY 100X72 PEDS (DRAPES) ×2 IMPLANT
DRAPE MICROSCOPE LEICA (MISCELLANEOUS) IMPLANT
DRAPE POUCH INSTRU U-SHP 10X18 (DRAPES) ×2 IMPLANT
DRAPE SURG 17X23 STRL (DRAPES) ×4 IMPLANT
DRILL NEURO 2X3.1 SOFT TOUCH (MISCELLANEOUS) ×2
ELECT REM PT RETURN 9FT ADLT (ELECTROSURGICAL) ×2
ELECTRODE REM PT RTRN 9FT ADLT (ELECTROSURGICAL) ×1 IMPLANT
GAUZE SPONGE 4X4 12PLY STRL (GAUZE/BANDAGES/DRESSINGS) ×2 IMPLANT
GAUZE SPONGE 4X4 16PLY XRAY LF (GAUZE/BANDAGES/DRESSINGS) IMPLANT
GLOVE BIO SURGEON STRL SZ8 (GLOVE) ×2 IMPLANT
GLOVE BIO SURGEON STRL SZ8.5 (GLOVE) ×2 IMPLANT
GLOVE EXAM NITRILE LRG STRL (GLOVE) IMPLANT
GLOVE EXAM NITRILE XL STR (GLOVE) IMPLANT
GLOVE EXAM NITRILE XS STR PU (GLOVE) IMPLANT
GLOVE INDICATOR 7.0 STRL GRN (GLOVE) ×1 IMPLANT
GOWN STRL REUS W/ TWL LRG LVL3 (GOWN DISPOSABLE) IMPLANT
GOWN STRL REUS W/ TWL XL LVL3 (GOWN DISPOSABLE) ×1 IMPLANT
GOWN STRL REUS W/TWL LRG LVL3 (GOWN DISPOSABLE)
GOWN STRL REUS W/TWL XL LVL3 (GOWN DISPOSABLE) ×2
HEMOSTAT POWDER KIT SURGIFOAM (HEMOSTASIS) ×2 IMPLANT
KIT BASIN OR (CUSTOM PROCEDURE TRAY) ×2 IMPLANT
KIT ROOM TURNOVER OR (KITS) ×2 IMPLANT
MARKER SKIN DUAL TIP RULER LAB (MISCELLANEOUS) ×2 IMPLANT
NDL SPNL 18GX3.5 QUINCKE PK (NEEDLE) ×1 IMPLANT
NEEDLE HYPO 22GX1.5 SAFETY (NEEDLE) ×2 IMPLANT
NEEDLE SPNL 18GX3.5 QUINCKE PK (NEEDLE) ×2 IMPLANT
NS IRRIG 1000ML POUR BTL (IV SOLUTION) ×2 IMPLANT
OIL CARTRIDGE MAESTRO DRILL (MISCELLANEOUS) ×2
PACK LAMINECTOMY NEURO (CUSTOM PROCEDURE TRAY) ×2 IMPLANT
PEEK S VISTA 7X11X14 (Peek) ×1 IMPLANT
PIN DISTRACTION 14MM (PIN) ×4 IMPLANT
PLATE ANT CERV XTEND ELD 1 L14 (Plate) ×1 IMPLANT
PUTTY KINEX BIOACTIVE 2CC (Bone Implant) ×1 IMPLANT
RUBBERBAND STERILE (MISCELLANEOUS) IMPLANT
SCREW XTD VAR 4.2 SELF TAP 12 (Screw) ×4 IMPLANT
SPONGE INTESTINAL PEANUT (DISPOSABLE) ×4 IMPLANT
SPONGE SURGIFOAM ABS GEL SZ50 (HEMOSTASIS) ×1 IMPLANT
STRIP CLOSURE SKIN 1/2X4 (GAUZE/BANDAGES/DRESSINGS) ×2 IMPLANT
SUT VIC AB 0 CT1 27 (SUTURE) ×2
SUT VIC AB 0 CT1 27XBRD ANTBC (SUTURE) ×1 IMPLANT
SUT VIC AB 3-0 SH 8-18 (SUTURE) ×2 IMPLANT
TAPE CLOTH SURG 6X10 WHT LF (GAUZE/BANDAGES/DRESSINGS) ×1 IMPLANT
TOWEL GREEN STERILE (TOWEL DISPOSABLE) ×2 IMPLANT
TOWEL GREEN STERILE FF (TOWEL DISPOSABLE) ×2 IMPLANT
WATER STERILE IRR 1000ML POUR (IV SOLUTION) ×2 IMPLANT

## 2017-10-25 NOTE — Progress Notes (Signed)
Pharmacy Antibiotic Note DIEM DICOCCO is a 71 y.o. female admitted on 10/25/2017. S/p C6-7 anterior cervical discectomy/decompression; pharmacy has been consulted for vancomycin dosing for surgical prophylaxis post op. No drain in place.   Plan: 1. Vancomycin 1 gram IV x 1 this evening 2. Will s/o as no drain in place     Temp (24hrs), Avg:98 F (36.7 C), Min:97 F (36.1 C), Max:98.6 F (37 C)  Recent Labs  Lab 10/18/17 1426  WBC 6.4  CREATININE 0.68    Estimated Creatinine Clearance: 71.1 mL/min (by C-G formula based on SCr of 0.68 mg/dL).    Allergies  Allergen Reactions  . Penicillins Hives, Rash and Other (See Comments)    PATIENT HAS HAD A PCN REACTION WITH IMMEDIATE RASH, FACIAL/TONGUE/THROAT SWELLING, SOB, OR LIGHTHEADEDNESS WITH HYPOTENSION:  #  #  #  YES  #  #  #   HAS PT DEVELOPED SEVERE RASH INVOLVING MUCUS MEMBRANES or SKIN NECROSIS: #  #  #  YES  #  #  #   Has patient had a PCN reaction that required hospitalization No Has patient had a PCN reaction occurring within the last 10 years: No If all of the above answers are "NO", then may proceed with Cephalosporin use.   Marland Kitchen Hydroxyzine Hcl     UNSPECIFIED REACTION   . Lactose Intolerance (Gi) Other (See Comments)    bloating  . Septra [Sulfamethoxazole-Trimethoprim] Rash    Thank you for allowing pharmacy to be a part of this patient's care.  Vincenza Hews, PharmD, BCPS 10/25/2017, 12:58 PM

## 2017-10-25 NOTE — Discharge Instructions (Signed)

## 2017-10-25 NOTE — Op Note (Signed)
Brief history: The patient is a 71 year old white female who has complained of neck and right arm pain consistent with a cervical radiculopathy.  She has failed medical management and was worked up with a cervical MRI.  This demonstrated herniated disc/spondylosis at C6-7 on the right.  I discussed the various treatment options with the patient including surgery.  She has weighed the risks, benefits, and alternatives of surgery and decided to proceed with a C6-7 anterior cervical discectomy, fusion and plating.  Preoperative diagnosis: C6-7 herniated disc, spondylosis, cervical radiculopathy, cervicalgia  Postoperative diagnosis: Same  Procedure: C6-7 anterior cervical discectomy/decompression; C6-7 interbody arthrodesis with local morcellized autograft bone and Kinnex bone graft extender; insertion of interbody prosthesis at C6-7 (Zimmer peek interbody prosthesis); anterior cervical plating from C6-7 with globus titanium plate  Surgeon: Dr. Earle Gell  Asst.: Dr. Annette Stable  Anesthesia: Gen. endotracheal  Estimated blood loss: 50 cc  Drains: None  Complications: None  Description of procedure: The patient was brought to the operating room by the anesthesia team. General endotracheal anesthesia was induced. A roll was placed under the patient's shoulders to keep the neck in the neutral position. The patient's anterior cervical region was then prepared with Betadine scrub and Betadine solution. Sterile drapes were applied.  The area to be incised was then injected with Marcaine with epinephrine solution. I then used a scalpel to make a transverse incision in the patient's left anterior neck. I used the Metzenbaum scissors to divide the platysmal muscle and then to dissect medial to the sternocleidomastoid muscle, jugular vein, and carotid artery. I carefully dissected down towards the anterior cervical spine identifying the esophagus and retracting it medially. Then using Kitner swabs to clear soft  tissue from the anterior cervical spine. We then inserted a bent spinal needle into the upper exposed intervertebral disc space. We then obtained intraoperative radiographs confirm our location.  I then used electrocautery to detach the medial border of the longus colli muscle bilaterally from the C6-7 intervertebral disc spaces. I then inserted the Caspar self-retaining retractor underneath the longus colli muscle bilaterally to provide exposure.  We then incised the intervertebral disc at C6-7. We then performed a partial intervertebral discectomy with a pituitary forceps and the Karlin curettes. I then inserted distraction screws into the vertebral bodies at C6 and C7. We then distracted the interspace. We then used the high-speed drill to decorticate the vertebral endplates at P2-3, to drill away the remainder of the intervertebral disc, to drill away some posterior spondylosis, and to thin out the posterior longitudinal ligament. I then incised ligament with the arachnoid knife. We then removed the ligament with a Kerrison punches undercutting the vertebral endplates and decompressing the thecal sac. We then performed foraminotomies about the bilateral C7 nerve roots. This completed the decompression at this level.  We now turned our to attention to the interbody fusion. We used the trial spacers to determine the appropriate size for the interbody prosthesis. We then pre-filled prosthesis with a combination of local morcellized autograft bone that we obtained during decompression as well as Kinnex bone graft extender. We then inserted the prosthesis into the distracted interspace at C6-7. We then removed the distraction screws. There was a good snug fit of the prosthesis in the interspace.  Having completed the fusion we now turned attention to the anterior spinal instrumentation. We used the high-speed drill to drill away some anterior spondylosis at the disc spaces so that the plate lay down flat. We  selected the appropriate length  titanium anterior cervical plate. We laid it along the anterior aspect of the vertebral bodies from C6-7. We then drilled 12 mm holes at C6 and C7. We then secured the plate to the vertebral bodies by placing two 12 mm self-tapping screws at C6 and C7. We then obtained intraoperative radiograph. The demonstrating good position of the instrumentation. We therefore secured the screws the plate the locking each cam. This completed the instrumentation.  We then obtained hemostasis using bipolar electrocautery. We irrigated the wound out with bacitracin solution. We then removed the retractor. We inspected the esophagus for any damage. There was none apparent. We then reapproximated patient's platysmal muscle with interrupted 3-0 Vicryl suture. We then reapproximated the subcutaneous tissue with interrupted 3-0 Vicryl suture. The skin was reapproximated with Steri-Strips and benzoin. The wound was then covered with bacitracin ointment. A sterile dressing was applied. The drapes were removed. Patient was subsequently extubated by the anesthesia team and transported to the post anesthesia care unit in stable condition. All sponge instrument and needle counts were reportedly correct at the end of this case.

## 2017-10-25 NOTE — H&P (Signed)
Subjective: The patient is a 71 year old white female who has complained of neck and right arm pain consistent with a cervical radiculopathy.  She has failed medical management and was worked up with a cervical MRI.  This demonstrated a herniated disc at C6-7.  I discussed the various treatment options with the patient.  She has decided to proceed with surgery.  Past Medical History:  Diagnosis Date  . Acid reflux   . Actinic keratosis    Precancerous  . Allergic rhinitis   . Anxiety   . Asthma   . Breast mass   . CVA (cerebral infarction)   . Cystocele   . Degenerative disc disease, cervical   . Diastolic dysfunction   . GERD (gastroesophageal reflux disease) PT STATES SEVERE---- CONTROLLED W/ NEXIUM  . History of hiatal hernia   . History of TIAs AGE 57  AND 2008---  NO RESIDUAL  . Hypertension   . Non-specific gastrointestinal complaint   . OSA (obstructive sleep apnea)    Severe CPAP 12 MM H2O- Dr Radford Pax  . Pneumonia    hx  . PONV (postoperative nausea and vomiting)   . Pulmonary HTN ([place])    Moderate with PASP 50mmHg by echo 03/2016  . Restless leg syndrome   . Rosacea   . Stroke [facility])    tia's x3 last 4 yrs ago  . SUI (stress urinary incontinence, female)     Past Surgical History:  Procedure Laterality Date  . BILATERAL BENIGN BREAST BX'S  20 YRS AGO  . BREAST EXCISIONAL BIOPSY    . CARDIAC CATHETERIZATION N/A 08/13/2016   Procedure: Right Heart Cath;  Surgeon: Larey Dresser, MD;  Location: [place] [facility];  Service: Cardiovascular;  Laterality: N/A;  . CARPAL TUNNEL RELEASE  2009   BILATERAL  . CYSTOCELE REPAIR  12/11/2011   Procedure: ANTERIOR REPAIR (CYSTOCELE);  Surgeon: Claybon Jabs, MD;  Location: [facility];  Service: Urology;  Laterality: N/A;  1 1/2 hour requested for case  Anterior repair and mid Urethral Sling  . NASAL SEPTOPLASTY W/ TURBINOPLASTY Bilateral 11/18/2016   Procedure: NASAL SEPTOPLASTY WITH BILATERAL TURBINATE  REDUCTION;  Surgeon: Jerrell Belfast, MD;  Location: [place];  Service: ENT;  Laterality: Bilateral;  . NONINVASIVE VASCULAR CAROTID STUDY  09-14-2007   BILATERAL MILD MIX PLAQUE THROUGHOUT, NO SIGNIFICANT BILATERAL ICA STENOSIS  . PUBOVAGINAL SLING  12/11/2011   Procedure: Gaynelle Arabian;  Surgeon: Claybon Jabs, MD;  Location: [facility];  Service: Urology;  Laterality: N/A;  . PULLEY RELEASE RIGHT THUMB  2009  . SINUS ENDO WITH FUSION Bilateral 11/18/2016   Procedure: BILATERAL ENDOSCOPIC SINUS SURGERY;  Surgeon: Jerrell Belfast, MD;  Location: [place];  Service: ENT;  Laterality: Bilateral;  . VAGINAL HYSTERECTOMY  AGE 73    Allergies  Allergen Reactions  . Penicillins Hives, Rash and Other (See Comments)    PATIENT HAS HAD A PCN REACTION WITH IMMEDIATE RASH, FACIAL/TONGUE/THROAT SWELLING, SOB, OR LIGHTHEADEDNESS WITH HYPOTENSION:  #  #  #  YES  #  #  #   HAS PT DEVELOPED SEVERE RASH INVOLVING MUCUS MEMBRANES or SKIN NECROSIS: #  #  #  YES  #  #  #   Has patient had a PCN reaction that required hospitalization No Has patient had a PCN reaction occurring within the last 10 years: No If all of the above answers are "NO", then may proceed with Cephalosporin use.   Marland Kitchen Hydroxyzine Hcl  UNSPECIFIED REACTION   . Lactose Intolerance (Gi) Other (See Comments)    bloating  . Septra [Sulfamethoxazole-Trimethoprim] Rash    Social History   Tobacco Use  . Smoking status: Never Smoker  . Smokeless tobacco: Never Used  Substance Use Topics  . Alcohol use: No    Family History  Adopted: Yes  Problem Relation Age of Onset  . Diabetes Mother   . Stroke Mother   . Alzheimer's disease Mother   . Hypertension Sister   . Diabetes Sister   . Heart attack Brother   . Leukemia Brother   . CAD Brother   . Diabetes Brother   . Colon cancer Maternal Aunt   . Stroke Paternal Grandmother   . Heart attack Paternal Grandfather    Prior to Admission medications   Medication Sig  Start Date End Date Taking? Authorizing Provider  CALCIUM-MAGNESIUM-VITAMIN D PO Take 1 tablet by mouth daily.   Yes [provider]  citalopram (CELEXA) 20 MG tablet 10 mg oral daily 10/17/15  Yes [provider]  diazepam (VALIUM) 5 MG tablet Take 2.5-5 mg by mouth daily as needed for anxiety (0.5-1 tablet as nedded for anxiet).  08/16/17  Yes [provider]  esomeprazole (NEXIUM) 40 MG capsule Take 40 mg by mouth 2 (two) times daily. Brand Name Only   Yes [provider]  fexofenadine (ALLEGRA) 60 MG tablet Take 60 mg by mouth daily.   Yes [provider]  fluticasone (FLONASE) 50 MCG/ACT nasal spray Place 2 sprays into both nostrils 2 (two) times daily.    Yes [provider]  hydrochlorothiazide (HYDRODIURIL) 25 MG tablet Take 25 mg by mouth daily.   Yes [provider]  oxyCODONE-acetaminophen (PERCOCET/ROXICET) 5-325 MG tablet Take 1 tablet by mouth every 6 (six) hours as needed for moderate pain.  09/07/17  Yes [provider]  polyethylene glycol (MIRALAX / GLYCOLAX) packet Take 17 g by mouth daily as needed for mild constipation.    Yes [provider]  potassium chloride (MICRO-K) 10 MEQ CR capsule Take 10 mEq by mouth 2 (two) times daily.  03/23/15  Yes [provider]  Probiotic Product (ALIGN PO) Take 1 tablet by mouth daily.   Yes [provider]  ranitidine (ZANTAC) 300 MG tablet TAKE 1 TABLET DAILY AS DIRECTED FOR REFLUX Patient taking differently: take 300 mg oral at bedtime 01/31/16  Yes Kozlow, Donnamarie Poag, MD  sodium chloride (OCEAN) 0.65 % SOLN nasal spray Place 1 spray into both nostrils 4 (four) times daily as needed for congestion.    Yes [provider]  traZODone (DESYREL) 50 MG tablet Take 50 mg by mouth at bedtime.   Yes [provider]  acetaminophen (TYLENOL) 500 MG tablet Take 1,000 mg by mouth every 6 (six) hours as needed for moderate pain.    [provider]     Review of Systems  Positive ROS: As above  All other systems have been reviewed and were otherwise negative with the exception of those mentioned in the HPI and as above.  Objective: Vital signs in last 24 hours: Temp:  [97.9 F (36.6 C)] 97.9 F (36.6 C) (11/26 0646) Pulse Rate:  [74] 74 (11/26 0646) Resp:  [19] 19 (11/26 0646) BP: (150)/(69) 150/69 (11/26 0646) SpO2:  [98 %] 98 % (11/26 0646)  General Appearance: Alert Head: Normocephalic, without obvious abnormality, atraumatic Eyes: PERRL, conjunctiva/corneas clear, EOM's intact,    Ears: Normal  Throat: Normal  Neck:  Supple, Back: unremarkable Lungs: Clear to auscultation bilaterally, respirations unlabored Heart: Regular rate and rhythm, no murmur, rub or gallop Abdomen: Soft, non-tender Extremities: Extremities normal, atraumatic, no cyanosis or edema Skin: unremarkable  NEUROLOGIC:   Mental status: alert and oriented,Motor Exam - grossly normal Sensory Exam - grossly normal Reflexes:  Coordination - grossly normal Gait - grossly normal Balance - grossly normal Cranial Nerves: I: smell Not tested  II: visual acuity  OS: Normal  OD: Normal   II: visual fields Full to confrontation  II: pupils Equal, round, reactive to light  III,VII: ptosis None  III,IV,VI: extraocular muscles  Full ROM  V: mastication Normal  V: facial light touch sensation  Normal  V,VII: corneal reflex  Present  VII: facial muscle function - upper  Normal  VII: facial muscle function - lower Normal  VIII: hearing Not tested  IX: soft palate elevation  Normal  IX,X: gag reflex Present  XI: trapezius strength  5/5  XI: sternocleidomastoid strength 5/5  XI: neck flexion strength  5/5  XII: tongue strength  Normal    Data Review Lab Results  Component Value Date   WBC 6.4 10/18/2017   HGB 12.4 10/18/2017   HCT 36.5 10/18/2017   MCV 90.3 10/18/2017   PLT 203 10/18/2017   Lab Results  Component Value Date    NA 138 10/18/2017   K 3.6 10/18/2017   CL 103 10/18/2017   CO2 29 10/18/2017   BUN 9 10/18/2017   CREATININE 0.68 10/18/2017   GLUCOSE 85 10/18/2017   Lab Results  Component Value Date   INR 0.95 08/13/2016    Assessment/Plan: C6-7 herniated disc, cervicalgia, cervical radiculopathy: I have discussed the situation with the patient and her husband.  I have reviewed her imaging studies with them.  We have discussed the various treatment options including surgery.  I have described the surgical treatment option of a C6-7 anterior cervical discectomy, fusion and plating.  I have shown her surgical models.  I have given her a surgical pamphlet.  We have discussed the risks, benefits, alternatives, expected postoperative course, and likelihood of achieving our goals with surgery.  I have answered all their questions.  She has decided to proceed with surgery.   [name] 10/25/2017 7:25 AM

## 2017-10-25 NOTE — Progress Notes (Signed)
Patient ID: Amy Vang, female   DOB: 1946-01-01, 71 y.o.   MRN: [id number] Subjective: The patient is alert and pleasant.  She looks well.  Objective: Vital signs in last 24 hours: Temp:  [97 F (36.1 C)-97.9 F (36.6 C)] 97 F (36.1 C) (11/26 1003) Pulse Rate:  [72-76] 72 (11/26 1021) Resp:  [10-19] 11 (11/26 1021) BP: (136-150)/(64-69) 137/64 (11/26 1017) SpO2:  [94 %-98 %] 97 % (11/26 1021)  Intake/Output from previous day: No intake/output data recorded. Intake/Output this shift: Total I/O In: 1000 [I.V.:1000] Out: 100 [Blood:100]  Physical exam the patient is alert and pleasant.  She is moving all 4 extremities well.  The patient's dressing is clean and dry.  There is no hematoma or shift.  Lab Results: No results for input(s): WBC, HGB, HCT, PLT in the last 72 hours. BMET No results for input(s): NA, K, CL, CO2, GLUCOSE, BUN, CREATININE, CALCIUM in the last 72 hours.  Studies/Results: Dg Cervical Spine 2-3 Views  Result Date: 10/25/2017 CLINICAL DATA:  Surgery: C6-7 ACDF Film 1: Localization film Film 2: Hardware level check EXAM: CERVICAL SPINE - 2-3 VIEW COMPARISON:  None. FINDINGS: FILM #1: A surgical instrument overlies the disc space at C4-5 FILM #2: Detail is limited. Anterior fusion has been performed at C6-7. Endotracheal tube is in place. IMPRESSION: Intraoperative localization. Electronically Signed   By: Nolon Nations M.D.   On: 10/25/2017 10:17    Assessment/Plan: The patient is doing well.  I spoke with her family.  She may want to go home later on today.  LOS: 0 days     Amy Vang 10/25/2017, 10:33 AM

## 2017-10-25 NOTE — Anesthesia Procedure Notes (Signed)
Procedure Name: Intubation Date/Time: 10/25/2017 7:46 AM Performed by: Gaylene Brooks, CRNA Pre-anesthesia Checklist: Patient identified, Emergency Drugs available, Suction available and Patient being monitored Patient Re-evaluated:Patient Re-evaluated prior to induction Oxygen Delivery Method: Circle System Utilized Preoxygenation: Pre-oxygenation with 100% oxygen Induction Type: IV induction Ventilation: Mask ventilation without difficulty and Oral airway inserted - appropriate to patient size Laryngoscope Size: Glidescope and 3 Grade View: Grade I Tube type: Oral Tube size: 7.0 mm Number of attempts: 2 Airway Equipment and Method: Stylet,  Oral airway and Video-laryngoscopy Placement Confirmation: ETT inserted through vocal cords under direct vision,  positive ETCO2 and breath sounds checked- equal and bilateral Secured at: 22 cm Tube secured with: Tape Dental Injury: Teeth and Oropharynx as per pre-operative assessment  Difficulty Due To: Difficult Airway- due to anterior larynx Comments: DL with Miller 2, Grade 2/3 view. Unable to pass ETT. Glidescope. Grade 1 view. ETT easily passed. +ETCO2, BBS=. AOI.

## 2017-10-25 NOTE — Progress Notes (Signed)
Pt doing well. Pt and family given D/C instructions with verbal understanding. Pt's IV was removed prior to D/C. Pt's incision is clean and dry with no sign of infection. Pt D/C'd home via wheelchair @ 1605 per MD order. Pt is stable @ D/C and has no other needs at this time. Holli Humbles, RN

## 2017-10-25 NOTE — Discharge Summary (Signed)
Physician Discharge Summary  Patient ID: Amy Vang MRN: [id number] DOB/AGE: 1946/08/02 71 y.o.  Admit date: 10/25/2017 Discharge date: 10/25/2017  Admission Diagnoses: C6-7 herniated disc, spondylosis, cervicalgia, cervical radiculopathy  Discharge Diagnoses: The same Active Problems:   Cervical spondylosis with radiculopathy   Discharged Condition: good  Hospital Course: I performed a C5-6-7 anterior cervical discectomy, fusion and plating on the patient on 10/25/2017.  The surgery went well.  The patient's postoperative course was unremarkable.  On the day of surgery, the patient, and her family, requested discharge to home.  They were given written and oral discharge instructions.  All their questions were answered.  Consults: None Significant Diagnostic Studies: None Treatments: C6-7 anterior cervical discectomy, fusion, and plating. Discharge Exam: Blood pressure (!) 143/65, pulse 68, temperature 98.6 F (37 C), temperature source Oral, resp. rate 18, SpO2 94 %. The patient is alert and pleasant.  She looks well.  Her dressing is clean and dry.  There is no evidence of hematoma or shift.  She is moving all 4 extremities well.  Disposition: Home  Discharge Instructions     Remove dressing in 72 hours   Complete by:  As directed    Call MD for:  difficulty breathing, headache or visual disturbances   Complete by:  As directed    Call MD for:  extreme fatigue   Complete by:  As directed    Call MD for:  hives   Complete by:  As directed    Call MD for:  persistant dizziness or light-headedness   Complete by:  As directed    Call MD for:  persistant nausea and vomiting   Complete by:  As directed    Call MD for:  redness, tenderness, or signs of infection (pain, swelling, redness, odor or green/yellow discharge around incision site)   Complete by:  As directed    Call MD for:  severe uncontrolled pain   Complete by:  As directed    Call MD for:  temperature  >100.4   Complete by:  As directed    Diet - low sodium heart healthy   Complete by:  As directed    Discharge instructions   Complete by:  As directed    Call [phone] for a followup appointment. Take a stool softener while you are using pain medications.   Driving Restrictions   Complete by:  As directed    Do not drive for 2 weeks.   Increase activity slowly   Complete by:  As directed    Lifting restrictions   Complete by:  As directed    Do not lift more than 5 pounds. No excessive bending or twisting.   May shower / Bathe   Complete by:  As directed    He may shower after the pain she is removed 3 days after surgery. Leave the incision alone.     Allergies as of 10/25/2017      Reactions   Penicillins Hives, Rash, Other (See Comments)   PATIENT HAS HAD A PCN REACTION WITH IMMEDIATE RASH, FACIAL/TONGUE/THROAT SWELLING, SOB, OR LIGHTHEADEDNESS WITH HYPOTENSION:  #  #  #  YES  #  #  #   HAS PT DEVELOPED SEVERE RASH INVOLVING MUCUS MEMBRANES or SKIN NECROSIS: #  #  #  YES  #  #  #   Has patient had a PCN reaction that required hospitalization No Has patient had a PCN reaction occurring within the last 10 years: No If all  of the above answers are "NO", then may proceed with Cephalosporin use.   Hydroxyzine Hcl    UNSPECIFIED REACTION    Lactose Intolerance (gi) Other (See Comments)   bloating   Septra [sulfamethoxazole-trimethoprim] Rash      Medication List    STOP taking these medications   acetaminophen 500 MG tablet Commonly known as:  TYLENOL   diazepam 5 MG tablet Commonly known as:  VALIUM     TAKE these medications   ALIGN PO Take 1 tablet by mouth daily.   CALCIUM-MAGNESIUM-VITAMIN D PO Take 1 tablet by mouth daily.   citalopram 20 MG tablet Commonly known as:  CELEXA 10 mg oral daily   cyclobenzaprine 10 MG tablet Commonly known as:  FLEXERIL Take 1 tablet (10 mg total) by mouth 3 (three) times daily as needed for muscle spasms.   docusate  sodium 100 MG capsule Commonly known as:  COLACE Take 1 capsule (100 mg total) by mouth 2 (two) times daily.   esomeprazole 40 MG capsule Commonly known as:  NEXIUM Take 40 mg by mouth 2 (two) times daily. Brand Name Only   fexofenadine 60 MG tablet Commonly known as:  ALLEGRA Take 60 mg by mouth daily.   fluticasone 50 MCG/ACT nasal spray Commonly known as:  FLONASE Place 2 sprays into both nostrils 2 (two) times daily.   hydrochlorothiazide 25 MG tablet Commonly known as:  HYDRODIURIL Take 25 mg by mouth daily.   oxyCODONE-acetaminophen 5-325 MG tablet Commonly known as:  PERCOCET/ROXICET Take 1-2 tablets by mouth every 4 (four) hours as needed for moderate pain. What changed:    how much to take  when to take this   polyethylene glycol packet Commonly known as:  MIRALAX / GLYCOLAX Take 17 g by mouth daily as needed for mild constipation.   potassium chloride 10 MEQ CR capsule Commonly known as:  MICRO-K Take 10 mEq by mouth 2 (two) times daily.   ranitidine 300 MG tablet Commonly known as:  ZANTAC TAKE 1 TABLET DAILY AS DIRECTED FOR REFLUX What changed:  See the new instructions.   sodium chloride 0.65 % Soln nasal spray Commonly known as:  OCEAN Place 1 spray into both nostrils 4 (four) times daily as needed for congestion.   traZODone 50 MG tablet Commonly known as:  DESYREL Take 50 mg by mouth at bedtime.        Signed: [name] 10/25/2017, 3:00 PM

## 2017-10-25 NOTE — Anesthesia Postprocedure Evaluation (Signed)
Anesthesia Post Note  Patient: Amy Vang  Procedure(s) Performed: CERVICAL SIX- CERVICAL SEVEN ANTERIOR CERVICAL DECOMPRESSION/DISCECTOMY FUSION, INTERBODY PROSTHESIS AND ANTERIOR PLATING (N/A )     Patient location during evaluation: PACU Anesthesia Type: General Level of consciousness: awake and alert Pain management: pain level controlled Vital Signs Assessment: post-procedure vital signs reviewed and stable Respiratory status: spontaneous breathing, nonlabored ventilation, respiratory function stable and patient connected to nasal cannula oxygen Cardiovascular status: blood pressure returned to baseline and stable Postop Assessment: no apparent nausea or vomiting Anesthetic complications: no    Last Vitals:  Vitals:   10/25/17 1112 10/25/17 1140  BP:  (!) 143/65  Pulse: 79 68  Resp: 16 18  Temp:  37 C  SpO2: 97% 94%    Last Pain:  Vitals:   10/25/17 1140  TempSrc: Oral  PainSc: 3                  Tiajuana Amass

## 2017-10-25 NOTE — Transfer of Care (Signed)
Immediate Anesthesia Transfer of Care Note  Patient: [name]  Procedure(s) Performed: CERVICAL SIX- CERVICAL SEVEN ANTERIOR CERVICAL DECOMPRESSION/DISCECTOMY FUSION, INTERBODY PROSTHESIS AND ANTERIOR PLATING (N/A )  Patient Location: PACU  Anesthesia Type:General  Level of Consciousness: awake, drowsy and patient cooperative  Airway & Oxygen Therapy: Patient Spontanous Breathing  Post-op Assessment: Report given to RN, Post -op Vital signs reviewed and stable and Patient moving all extremities X 4  Post vital signs: Reviewed and stable  Last Vitals:  Vitals:   10/25/17 0646  BP: (!) 150/69  Pulse: 74  Resp: 19  Temp: 36.6 C  SpO2: 98%    Last Pain:  Vitals:   10/25/17 0646  TempSrc: Oral  PainSc:          Complications: No apparent anesthesia complications

## 2017-10-26 ENCOUNTER — Encounter (HOSPITAL_COMMUNITY): Payer: Self-pay | Admitting: Neurosurgery

## 2017-12-22 DIAGNOSIS — R490 Dysphonia: Secondary | ICD-10-CM | POA: Insufficient documentation

## 2018-03-23 ENCOUNTER — Other Ambulatory Visit: Payer: Self-pay | Admitting: Family Medicine

## 2018-03-23 DIAGNOSIS — Z139 Encounter for screening, unspecified: Secondary | ICD-10-CM

## 2018-04-12 ENCOUNTER — Ambulatory Visit: Payer: Medicare PPO

## 2018-04-14 ENCOUNTER — Ambulatory Visit
Admission: RE | Admit: 2018-04-14 | Discharge: 2018-04-14 | Disposition: A | Discharge status: Home / Self Care | Payer: Medicare PPO | Source: Ambulatory Visit | Attending: Family Medicine | Admitting: Family Medicine

## 2018-04-14 DIAGNOSIS — Z139 Encounter for screening, unspecified: Secondary | ICD-10-CM

## 2018-05-06 DIAGNOSIS — M25511 Pain in right shoulder: Secondary | ICD-10-CM | POA: Insufficient documentation

## 2018-08-18 LAB — HM COLONOSCOPY

## 2018-08-19 NOTE — Therapy (Signed)
.  [facility] [address] [place], [place], [address] Phone: [phone]   Fax:  [phone]  Patient Details  Name: Amy Vang MRN: [id number] Date of Birth: [birth year] Referring Provider:  Brand Males, MD  Encounter Date: 08/19/2018  SPEECH THERAPY DISCHARGE SUMMARY  Visits from Start of Care: 4  Current functional level related to goals / functional outcomes: Pt made excellent gains in therapy, reducing her frequency of overt s/s VCD considerably, and pt reports people around her noticed this.  Pt did not return to ST after March 2018. LTGs at the time of last session were:   SLP Long Term Goals - 02/09/17 1656              SLP LONG TERM GOAL #1    Title pt will use abdominal breathing (AB) in simple conversation of 10 minutes 75% of the time over two sessions    Baseline 02-09-17    Time 6    Period Weeks    Status On-going         SLP LONG TERM GOAL #2    Title pt will demo less than 10 throat clears per session in 4 therapy sessions    Baseline 01-25-17, 02-09-17    Time 6    Period Weeks    Status On-going         SLP LONG TERM GOAL #3    Title pt will report at least 50% reduction in overt s/s VCD compared to pre-ST    Status Achieved         SLP LONG TERM GOAL #4    Title pt will report using relaxation strategies outside sessions between 5 ST sessions    Baseline 02-09-17    Time 6    Period Weeks    Status On-going    Remaining deficits: Minimal/minor s/s VCD.   Education / Equipment: Relaxation techniques, abdominal breathing.  Plan: Patient agrees to discharge.  Patient goals were not met. Patient is being discharged due to not returning since the last visit.  ?????      [facility] ,MS, CCC-SLP  08/19/2018, 10:57 AM  [facility] [address] [place], [place], [address] Phone: [phone]   Fax:   [phone]

## 2018-10-28 DIAGNOSIS — M6530 Trigger finger, unspecified finger: Secondary | ICD-10-CM | POA: Insufficient documentation

## 2018-11-04 ENCOUNTER — Other Ambulatory Visit: Payer: Self-pay | Admitting: Family Medicine

## 2018-11-04 ENCOUNTER — Ambulatory Visit
Admission: RE | Admit: 2018-11-04 | Discharge: 2018-11-04 | Disposition: A | Discharge status: Home / Self Care | Payer: Medicare PPO | Source: Ambulatory Visit | Attending: Family Medicine | Admitting: Family Medicine

## 2018-11-04 DIAGNOSIS — R05 Cough: Secondary | ICD-10-CM

## 2018-11-04 DIAGNOSIS — R059 Cough, unspecified: Secondary | ICD-10-CM

## 2018-11-07 DIAGNOSIS — M75110 Incomplete rotator cuff tear or rupture of unspecified shoulder, not specified as traumatic: Secondary | ICD-10-CM | POA: Insufficient documentation

## 2018-12-06 ENCOUNTER — Ambulatory Visit: Payer: Medicare PPO

## 2018-12-21 ENCOUNTER — Ambulatory Visit: Payer: Medicare Other | Admitting: Physical Therapy

## 2018-12-27 ENCOUNTER — Encounter: Payer: BC Managed Care – PPO | Admitting: Physical Therapy

## 2018-12-30 ENCOUNTER — Ambulatory Visit: Payer: Medicare Other | Admitting: Physical Therapy

## 2019-01-17 DIAGNOSIS — Z4789 Encounter for other orthopedic aftercare: Secondary | ICD-10-CM | POA: Insufficient documentation

## 2019-02-28 ENCOUNTER — Ambulatory Visit: Payer: Medicare Other | Attending: Orthopedic Surgery

## 2019-02-28 ENCOUNTER — Other Ambulatory Visit: Payer: Self-pay

## 2019-02-28 DIAGNOSIS — M25511 Pain in right shoulder: Secondary | ICD-10-CM | POA: Diagnosis present

## 2019-02-28 DIAGNOSIS — M6281 Muscle weakness (generalized): Secondary | ICD-10-CM | POA: Diagnosis present

## 2019-02-28 DIAGNOSIS — M25611 Stiffness of right shoulder, not elsewhere classified: Secondary | ICD-10-CM | POA: Diagnosis present

## 2019-02-28 NOTE — Patient Instructions (Addendum)
Scapular retraction   Keep hands clasped   Squeeze your shoulder blades back   Hold for 5 seconds    Repeat 10 times   Perform 3 sets daily.     Supine R shoulder flexion AAROM 10x3 daily reviewed and given as part of her HEP. Pt demonstrated and verbalized understanding. Handout provided.

## 2019-02-28 NOTE — Therapy (Signed)
[facility] [address] [place], [address] Phone: [phone]   Fax:  [phone]  Physical Therapy Evaluation  Patient Details  Name: Amy Vang MRN: [id number] Date of Birth: [birth year] Referring Provider (PT): Justice Britain, MD   Encounter Date: 02/28/2019  PT End of Session - 02/28/19 1307    Visit Number  1    Number of Visits  17    Date for PT Re-Evaluation  04/27/19    Authorization Type  1    Authorization Time Period  of 10 medicare (02/28/2019 eval)    PT Start Time  1307    PT Stop Time  1405    PT Time Calculation (min)  58 min    Activity Tolerance  Patient tolerated treatment well    Behavior During Therapy  [facility] for tasks assessed/performed       Past Medical History:  Diagnosis Date  . Acid reflux   . Actinic keratosis    Precancerous  . Allergic rhinitis   . Anxiety   . Asthma   . Breast mass   . CVA (cerebral infarction)   . Cystocele   . Degenerative disc disease, cervical   . Diastolic dysfunction   . GERD (gastroesophageal reflux disease) PT STATES SEVERE---- CONTROLLED W/ NEXIUM  . History of hiatal hernia   . History of TIAs AGE 73  AND 2008---  NO RESIDUAL  . Hypertension   . Non-specific gastrointestinal complaint   . OSA (obstructive sleep apnea)    Severe CPAP 12 MM H2O- Dr Radford Pax  . Pneumonia    hx  . PONV (postoperative nausea and vomiting)   . Pulmonary HTN ([place])    Moderate with PASP 26mmHg by echo 03/2016  . Restless leg syndrome   . Rosacea   . Stroke [facility])    tia's x3 last 4 yrs ago  . SUI (stress urinary incontinence, female)     Past Surgical History:  Procedure Laterality Date  . ANTERIOR CERVICAL DECOMP/DISCECTOMY FUSION N/A 10/25/2017   Procedure: CERVICAL SIX- CERVICAL SEVEN ANTERIOR CERVICAL DECOMPRESSION/DISCECTOMY FUSION, INTERBODY PROSTHESIS AND ANTERIOR PLATING;  Surgeon: Newman Pies, MD;  Location: [place];  Service:  Neurosurgery;  Laterality: N/A;  CERVICAL 6- CERVICAL 7 ANTERIOR CERVICAL DECOMPRESSION/DISCECTOMY FUSION, INTERBODY PROSTHESIS AND ANTERIOR PLATING  . BILATERAL BENIGN BREAST BX'S  20 YRS AGO  . BREAST BIOPSY Right    benign  . BREAST EXCISIONAL BIOPSY Bilateral over 10 years ago   benign  . CARDIAC CATHETERIZATION N/A 08/13/2016   Procedure: Right Heart Cath;  Surgeon: Larey Dresser, MD;  Location: [place] CV LAB;  Service: Cardiovascular;  Laterality: N/A;  . CARPAL TUNNEL RELEASE  2009   BILATERAL  . CYSTOCELE REPAIR  12/11/2011   Procedure: ANTERIOR REPAIR (CYSTOCELE);  Surgeon: Claybon Jabs, MD;  Location: [facility];  Service: Urology;  Laterality: N/A;  1 1/2 hour requested for case  Anterior repair and mid Urethral Sling  . NASAL SEPTOPLASTY W/ TURBINOPLASTY Bilateral 11/18/2016   Procedure: NASAL SEPTOPLASTY WITH BILATERAL TURBINATE REDUCTION;  Surgeon: Jerrell Belfast, MD;  Location: [place];  Service: ENT;  Laterality: Bilateral;  . NONINVASIVE VASCULAR CAROTID STUDY  09-14-2007   BILATERAL MILD MIX PLAQUE THROUGHOUT, NO SIGNIFICANT BILATERAL ICA STENOSIS  . PUBOVAGINAL SLING  12/11/2011   Procedure: Gaynelle Arabian;  Surgeon: Claybon Jabs, MD;  Location: [facility];  Service: Urology;  Laterality: N/A;  . PULLEY RELEASE  RIGHT THUMB  2009  . SINUS ENDO WITH FUSION Bilateral 11/18/2016   Procedure: BILATERAL ENDOSCOPIC SINUS SURGERY;  Surgeon: Jerrell Belfast, MD;  Location: [place];  Service: ENT;  Laterality: Bilateral;  . VAGINAL HYSTERECTOMY  AGE 58    There were no vitals filed for this visit.   Subjective Assessment - 02/28/19 1316    Subjective  R shoulder: 2/10 currently (used to not have R shoulder pain until around yesterday). 0/10 at most for the past 4 weeks. Had pain first 2 weeks post op, then had mild pain.     Pertinent History  S/P R rotator cuff repair on 01/17/2019. Her next MD appointment is this Friday. Did not wear  her sling today because she did not want it exposed to COVID-19. Did not wear her sling much yesterday and it woke her up in the middle of the night.  Has been doing her pendulums at home.  Has not had PT since her surgery.  Pt is L hand dominant.  Pt states that the surgery went well.     Patient Stated Goals  Be able to use her R arm and be functional like before (be able to raise her arm up and pick up her 20 year old grandson)    Currently in Pain?  Yes    Pain Score  2     Pain Location  Shoulder    Pain Orientation  Right    Pain Descriptors / Indicators  Aching    Pain Type  Chronic pain;Surgical pain;Acute pain    Pain Onset  1 to 4 weeks ago    Pain Frequency  Occasional    Aggravating Factors   moving her R arm    Pain Relieving Factors  rest          [facility] PT Assessment - 02/28/19 1325      Assessment   Medical Diagnosis  Encounter for orthopedic aftercare   R rotator cuff repair   Referring Provider (PT)  Justice Britain, MD    Onset Date/Surgical Date  01/17/19    Hand Dominance  Left    Next MD Visit  03/03/2019    Prior Therapy  none      Precautions   Precaution Comments  Follow protocol      Restrictions   Other Position/Activity Restrictions  follow protocol      Observation/Other Assessments   Observations  Pt tendency for R scapular protraction with R arm AAROM movements. Cues needed to correct    Skin Integrity  surgical incisions healed well       Posture/Postural Control   Posture Comments  R shoulder lower, B protracted shoulders, protracted neck, slight thoracic kyphosis      PROM   Overall PROM Comments  performed in supine    Right Shoulder Flexion  113 Degrees   supine AAROM   Right Shoulder ABduction  120 Degrees   scaption AAROM   Right Shoulder Internal Rotation  66 Degrees   AAROM in scapular plane   Right Shoulder External Rotation  53 Degrees   AAROM in scapular plane     Strength   Overall Strength Comments  R shoulder strength not yet  assessed    Left Shoulder Flexion  5/5    Left Shoulder ABduction  4+/5    Left Shoulder Internal Rotation  5/5    Left Shoulder External Rotation  4+/5      Palpation   Palpation comment  TTP anterior  R shoulder inferior to acromion process, distal infraspinatus, lateral deltoid area.                 Objective measurements completed on examination: See above findings.   S/P 6 weeks    No latex band allergies Blood pressure controlled per pt Has not yet had PT for her R shoulder    Therapeutic exercise  Worked on AAROM per protocol   Supine AAROM with PT 10x (reviewed and given as part of her HEP. Pt demonstrated and verbalized understanding. Handout provided )  scaption 10x   In scapular plane: ER and IR 10x each way   Seated B scapular retraction 10x3 with 5 second holds  Reviewed and given as part of her HEP. Pt demonstrated and verbalized understanding. Handout provided.     Reviewed plan of care: 2x/week for 8 weeks, can drop down to 1x/week when appropriate  Improved exercise technique, movement at target joints, use of target muscles after min to mod verbal, visual, tactile cues.   No R shoulder pain after session.    Patient is a 73 year old female who came to physical therapy S/P R rotator cuff repair on 01/17/2019. She also presents with limited R shoulder ROM, weakness, TTP, decreased scapular control, poor posture, and difficulty performing functional tasks such as reaching. Pt will benefit from skilled physical therapy services to address the aforementioned deficits.   PT Education - 02/28/19 1542    Education Details  ther-ex, HEP, plan of care    Person(s) Educated  Patient    Methods  Explanation;Demonstration;Tactile cues;Verbal cues;Handout    Comprehension  Verbalized understanding;Returned demonstration       PT Short Term Goals - 02/28/19 1548      PT SHORT TERM GOAL #1   Title  Patient will be independent with her HEP to promote ROM,  strength, and function.     Baseline  Pt has started her HEP (02/28/2019)    Time  3    Period  Weeks    Status  New    Target Date  03/23/19     CVA   PT Long Term Goals - 02/28/19 1549      PT LONG TERM GOAL #1   Title  Patient will improve R shoulder flexion and scaption AROM to 140 degrees or more to promote ability to reach.     Baseline  Supine R shoulder AAROM 113 degrees flexion, 120 degrees scaption (02/28/2019)    Time  8    Period  Weeks    Status  New    Target Date  04/27/19      PT LONG TERM GOAL #2   Title  Patient will improve R shoulder ER and IR AROM to 80 degrees or more to promote ability to reach behind her back as well as behind her head.     Baseline  Supine R shoulder AAROM in scapular plane: 53 degrees ER, 66 degrees IR (02/28/2019)     Time  8    Period  Weeks    Status  New    Target Date  04/27/19      PT LONG TERM GOAL #3   Title  Patient will have at least 4+/5 R shoulder strength (flexion, scaption, ER, IR) to promote ability to raise her R arm, as well as to use it to perform functional tasks.     Baseline  R shoulder strength not yet tested (02/28/2019)    Time  8    Period  Weeks    Status  New    Target Date  04/27/19             Plan - 02/28/19 1542    Clinical Impression Statement  Patient is a 73 year old female who came to physical therapy S/P R rotator cuff repair on 01/17/2019. She also presents with limited R shoulder ROM, weakness, TTP, decreased scapular control, poor posture, and difficulty performing functional tasks such as reaching. Pt will benefit from skilled physical therapy services to address the aforementioned deficits.     Personal Factors and Comorbidities  Age;Comorbidity 1    Comorbidities  CVA hx    Examination-Activity Limitations  Bathing;Carry;Dressing;Hygiene/Grooming;Lift;Reach Overhead;Toileting    Stability/Clinical Decision Making  Stable/Uncomplicated   Decreased R shoulder pain after session   Clinical  Decision Making  Low    Rehab Potential  Good    PT Frequency  2x / week    PT Duration  8 weeks    PT Treatment/Interventions  Therapeutic exercise;Therapeutic activities;Neuromuscular re-education;Patient/family education;Manual techniques;Dry needling;Electrical Stimulation;Iontophoresis 4mg /ml Dexamethasone    PT Next Visit Plan  PROM, AAROM, scapular strengthening, AROM and strengthening when appropriate, follow protocol    Consulted and Agree with Plan of Care  Patient       Patient will benefit from skilled therapeutic intervention in order to improve the following deficits and impairments:  Pain, Postural dysfunction, Impaired UE functional use, Improper body mechanics, Decreased strength, Decreased range of motion  Visit Diagnosis: Acute pain of right shoulder - Plan: PT plan of care cert/re-cert  Muscle weakness (generalized) - Plan: PT plan of care cert/re-cert  Stiffness of right shoulder, not elsewhere classified - Plan: PT plan of care cert/re-cert     Problem List Patient Active Problem List   Diagnosis Date Noted  . Cervical spondylosis with radiculopathy 10/25/2017  . Sinusitis, chronic 11/18/2016  . Dyspnea and respiratory abnormality 05/21/2016  . Wheeze 05/21/2016  . Chronic throat clearing 05/21/2016  . Pulmonary HTN ([place]) 04/23/2016  . Moderate persistent asthma 11/12/2015  . Allergic rhinoconjunctivitis 11/12/2015  . 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  . SOB (shortness of breath) 03/06/2015  . OSA (obstructive sleep apnea) 01/01/2014  . Benign essential HTN 01/01/2014  . Obesity 01/01/2014    Joneen Boers PT, DPT   02/28/2019, 4:18 PM  [facility] [address] [place], [address] Phone: [phone]   Fax:  [phone]  Name: Amy Vang MRN: [id number] Date of Birth: 1946-05-09

## 2019-03-02 ENCOUNTER — Ambulatory Visit: Payer: Medicare Other

## 2019-03-06 ENCOUNTER — Other Ambulatory Visit: Payer: Self-pay

## 2019-03-06 ENCOUNTER — Ambulatory Visit: Payer: Medicare Other | Attending: Orthopedic Surgery

## 2019-03-06 DIAGNOSIS — M6281 Muscle weakness (generalized): Secondary | ICD-10-CM | POA: Diagnosis present

## 2019-03-06 DIAGNOSIS — M25511 Pain in right shoulder: Secondary | ICD-10-CM | POA: Diagnosis present

## 2019-03-06 DIAGNOSIS — M25611 Stiffness of right shoulder, not elsewhere classified: Secondary | ICD-10-CM | POA: Diagnosis present

## 2019-03-06 NOTE — Therapy (Signed)
[facility] [address], [place], [address] Phone: [phone]   Fax:  [phone]  Physical Therapy Treatment  Patient Details  Name: Amy Vang MRN: [id number] Date of Birth: 07-Jul-1946 Referring Provider (PT): Justice Britain, MD   Encounter Date: 03/06/2019  PT End of Session - 03/06/19 1405    Visit Number  2    Number of Visits  17    Date for PT Re-Evaluation  04/27/19    Authorization Type  2    Authorization Time Period  of 10 medicare (02/28/2019 eval)    PT Start Time  1405    PT Stop Time  1449    PT Time Calculation (min)  44 min    Activity Tolerance  Patient tolerated treatment well    Behavior During Therapy  [facility] for tasks assessed/performed       Past Medical History:  Diagnosis Date  . Acid reflux   . Actinic keratosis    Precancerous  . Allergic rhinitis   . Anxiety   . Asthma   . Breast mass   . CVA (cerebral infarction)   . Cystocele   . Degenerative disc disease, cervical   . Diastolic dysfunction   . GERD (gastroesophageal reflux disease) PT STATES SEVERE---- CONTROLLED W/ NEXIUM  . History of hiatal hernia   . History of TIAs AGE 73  AND 2008---  NO RESIDUAL  . Hypertension   . Non-specific gastrointestinal complaint   . OSA (obstructive sleep apnea)    Severe CPAP 12 MM H2O- Dr Radford Pax  . Pneumonia    hx  . PONV (postoperative nausea and vomiting)   . Pulmonary HTN ([place])    Moderate with PASP 38mmHg by echo 03/2016  . Restless leg syndrome   . Rosacea   . Stroke [facility])    tia's x3 last 4 yrs ago  . SUI (stress urinary incontinence, female)     Past Surgical History:  Procedure Laterality Date  . ANTERIOR CERVICAL DECOMP/DISCECTOMY FUSION N/A 10/25/2017   Procedure: CERVICAL SIX- CERVICAL SEVEN ANTERIOR CERVICAL DECOMPRESSION/DISCECTOMY FUSION, INTERBODY PROSTHESIS AND ANTERIOR PLATING;  Surgeon: Newman Pies, MD;  Location: [place];  Service: Neurosurgery;   Laterality: N/A;  CERVICAL 6- CERVICAL 7 ANTERIOR CERVICAL DECOMPRESSION/DISCECTOMY FUSION, INTERBODY PROSTHESIS AND ANTERIOR PLATING  . BILATERAL BENIGN BREAST BX'S  20 YRS AGO  . BREAST BIOPSY Right    benign  . BREAST EXCISIONAL BIOPSY Bilateral over 10 years ago   benign  . CARDIAC CATHETERIZATION N/A 08/13/2016   Procedure: Right Heart Cath;  Surgeon: Larey Dresser, MD;  Location: [place] [facility];  Service: Cardiovascular;  Laterality: N/A;  . CARPAL TUNNEL RELEASE  2009   BILATERAL  . CYSTOCELE REPAIR  12/11/2011   Procedure: ANTERIOR REPAIR (CYSTOCELE);  Surgeon: Claybon Jabs, MD;  Location[facility];  Service: Urology;  Laterality: N/A;  1 1/2 hour requested for case  Anterior repair and mid Urethral Sling  . NASAL SEPTOPLASTY W/ TURBINOPLASTY Bilateral 11/18/2016   Procedure: NASAL SEPTOPLASTY WITH BILATERAL TURBINATE REDUCTION;  Surgeon: Jerrell Belfast, MD;  Location: [place];  Service: ENT;  Laterality: Bilateral;  . NONINVASIVE VASCULAR CAROTID STUDY  09-14-2007   BILATERAL MILD MIX PLAQUE THROUGHOUT, NO SIGNIFICANT BILATERAL ICA STENOSIS  . PUBOVAGINAL SLING  12/11/2011   Procedure: Gaynelle Arabian;  Surgeon: Claybon Jabs, MD;  Location: [facility];  Service: Urology;  Laterality: N/A;  . PULLEY RELEASE  RIGHT THUMB  2009  . SINUS ENDO WITH FUSION Bilateral 11/18/2016   Procedure: BILATERAL ENDOSCOPIC SINUS SURGERY;  Surgeon: Jerrell Belfast, MD;  Location: [place];  Service: ENT;  Laterality: Bilateral;  . VAGINAL HYSTERECTOMY  AGE 62    There were no vitals filed for this visit.  Subjective Assessment - 03/06/19 1406    Subjective  R shoulder is 1/10 currently. 7-8/10 yesterday. Might have overused her shoulder with chores. R shoulder did not bother her with her HEP.     Pertinent History  S/P R rotator cuff repair on 01/17/2019. Her next MD appointment is this Friday. Did not wear her sling today because she did not want it exposed  to COVID-19. Did not wear her sling much yesterday and it woke her up in the middle of the night.  Has been doing her pendulums at home.  Has not had PT since her surgery.  Pt is L hand dominant.  Pt states that the surgery went well.     Patient Stated Goals  Be able to use her R arm and be functional like before (be able to raise her arm up and pick up her 49 year old grandson)    Currently in Pain?  Yes    Pain Score  1     Pain Onset  More than a month ago                               PT Education - 03/06/19 1447    Education Details  ther-ex, HEP    Person(s) Educated  Patient    Methods  Explanation;Demonstration;Tactile cues;Verbal cues    Comprehension  Returned demonstration;Verbalized understanding      Objective   S/P 7 weeks              No latex band allergies  Medbridge Access Code: [email or web address]    Therapeutic exercise  Seated R shoulder AAROM using pulley  Flexion 10x3  scaption 10x3  Abduction 10x3  Reviewed and given as part of her HEP. Pt demonstrated and verbalized understanding.   Bent over R scapular retraction  10x5 seconds for 3 sets   Supine R shoulder ER in scapular plane AAROM with dowel 10x3  Reviewed and given as part HEP. Pt demonstrated and verbalized understanding.    Reviewed supine R shoulder flexion AAROM with L hand 1x  130 degrees flexion in supine AAROM    Improved exercise technique, movement at target joints, use of target muscles after mod verbal, visual, tactile cues.   Response to treatment Good stretch felt with exercises, no pain. Pt tolerated session well without aggravation of symptoms.   Clinical impression Continued working on [employer] per protocol. Improved supine R shoulder flexion AAROM since last session. Added pulley exercises for flexion, scaption, and abduction as well as supine R shoulder ER with dowel to improve AAROM and decrease stiffness. Pt will benefit from continued skilled physical  therapy services to improve ROM, strength, and function.             PT Short Term Goals - 02/28/19 1548      PT SHORT TERM GOAL #1   Title  Patient will be independent with her HEP to promote ROM, strength, and function.     Baseline  Pt has started her HEP (02/28/2019)    Time  3    Period  Weeks    Status  New  Target Date  03/23/19        PT Long Term Goals - 02/28/19 1549      PT LONG TERM GOAL #1   Title  Patient will improve R shoulder flexion and scaption AROM to 140 degrees or more to promote ability to reach.     Baseline  Supine R shoulder AAROM 113 degrees flexion, 120 degrees scaption (02/28/2019)    Time  8    Period  Weeks    Status  New    Target Date  04/27/19      PT LONG TERM GOAL #2   Title  Patient will improve R shoulder ER and IR AROM to 80 degrees or more to promote ability to reach behind her back as well as behind her head.     Baseline  Supine R shoulder AAROM in scapular plane: 53 degrees ER, 66 degrees IR (02/28/2019)     Time  8    Period  Weeks    Status  New    Target Date  04/27/19      PT LONG TERM GOAL #3   Title  Patient will have at least 4+/5 R shoulder strength (flexion, scaption, ER, IR) to promote ability to raise her R arm, as well as to use it to perform functional tasks.     Baseline  R shoulder strength not yet tested (02/28/2019)    Time  8    Period  Weeks    Status  New    Target Date  04/27/19            Plan - 03/06/19 2118    Clinical Impression Statement  Continued working on AAROM per protocol. Improved supine R shoulder flexion AAROM since last session. Added pulley exercises for flexion, scaption, and abduction as well as supine R shoulder ER with dowel to improve AAROM and decrease stiffness. Pt will benefit from continued skilled physical therapy services to improve ROM, strength, and function.      Personal Factors and Comorbidities  Age;Comorbidity 1    Comorbidities  CVA hx    Examination-Activity  Limitations  Bathing;Carry;Dressing;Hygiene/Grooming;Lift;Reach Overhead;Toileting    Stability/Clinical Decision Making  Stable/Uncomplicated   Decreased R shoulder pain after session   Rehab Potential  Good    PT Frequency  2x / week    PT Duration  8 weeks    PT Treatment/Interventions  Therapeutic exercise;Therapeutic activities;Neuromuscular re-education;Patient/family education;Manual techniques;Dry needling;Electrical Stimulation;Iontophoresis 4mg /ml Dexamethasone    PT Next Visit Plan  PROM, AAROM, scapular strengthening, AROM and strengthening when appropriate, follow protocol    Consulted and Agree with Plan of Care  Patient       Patient will benefit from skilled therapeutic intervention in order to improve the following deficits and impairments:  Pain, Postural dysfunction, Impaired UE functional use, Improper body mechanics, Decreased strength, Decreased range of motion  Visit Diagnosis: Acute pain of right shoulder  Muscle weakness (generalized)  Stiffness of right shoulder, not elsewhere classified     Problem List Patient Active Problem List   Diagnosis Date Noted  . Cervical spondylosis with radiculopathy 10/25/2017  . Sinusitis, chronic 11/18/2016  . Dyspnea and respiratory abnormality 05/21/2016  . Wheeze 05/21/2016  . Chronic throat clearing 05/21/2016  . Pulmonary HTN ([place]) 04/23/2016  . Moderate persistent asthma 11/12/2015  . Allergic rhinoconjunctivitis 11/12/2015  . 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  . SOB (shortness of breath) 03/06/2015  . OSA (obstructive sleep apnea) 01/01/2014  . Benign essential HTN 01/01/2014  .  Obesity 01/01/2014    Joneen Boers PT, DPT   03/06/2019, 9:23 PM  [facility] [address], [place], [address] Phone: [phone]   Fax:  [phone]  Name: OLIVINE HIERS MRN: [id number] Date of Birth: [birth year]

## 2019-03-06 NOTE — Patient Instructions (Addendum)
  Medbridge Access Code: [email or web address]   Seated Shoulder Flexion AAROM with Pulley Behind  10x3 for 2x daily  Seated Shoulder Scaption AAROM with Pulley at Side  10x3 for 2x daily  Seated Shoulder Abduction AAROM with Pulley Behind   10x3 for 2x daily  Supine Shoulder External Rotation in 45 Degrees Abduction AAROM with Dowel   10x3 for 2 sets daily

## 2019-03-08 ENCOUNTER — Other Ambulatory Visit: Payer: Self-pay

## 2019-03-08 ENCOUNTER — Ambulatory Visit: Payer: Medicare Other

## 2019-03-08 DIAGNOSIS — M25511 Pain in right shoulder: Secondary | ICD-10-CM | POA: Diagnosis not present

## 2019-03-08 DIAGNOSIS — M6281 Muscle weakness (generalized): Secondary | ICD-10-CM

## 2019-03-08 DIAGNOSIS — M25611 Stiffness of right shoulder, not elsewhere classified: Secondary | ICD-10-CM

## 2019-03-08 NOTE — Therapy (Signed)
[facility] [address], [place], [address] Phone: [phone]   Fax:  [phone]  Physical Therapy Treatment  Patient Details  Name: Amy Vang MRN: [id number] Date of Birth: 09/17/1946 Referring Provider (PT): Justice Britain, MD   Encounter Date: 03/08/2019  PT End of Session - 03/08/19 1416    Visit Number  3    Number of Visits  17    Date for PT Re-Evaluation  04/27/19    Authorization Type  3    Authorization Time Period  of 10 medicare (02/28/2019 eval)    PT Start Time  1416    PT Stop Time  1501    PT Time Calculation (min)  45 min    Activity Tolerance  Patient tolerated treatment well    Behavior During Therapy  [facility] for tasks assessed/performed       Past Medical History:  Diagnosis Date  . Acid reflux   . Actinic keratosis    Precancerous  . Allergic rhinitis   . Anxiety   . Asthma   . Breast mass   . CVA (cerebral infarction)   . Cystocele   . Degenerative disc disease, cervical   . Diastolic dysfunction   . GERD (gastroesophageal reflux disease) PT STATES SEVERE---- CONTROLLED W/ NEXIUM  . History of hiatal hernia   . History of TIAs AGE 73  AND 2008---  NO RESIDUAL  . Hypertension   . Non-specific gastrointestinal complaint   . OSA (obstructive sleep apnea)    Severe CPAP 12 MM H2O- Dr Radford Pax  . Pneumonia    hx  . PONV (postoperative nausea and vomiting)   . Pulmonary HTN ([place])    Moderate with PASP 50mmHg by echo 03/2016  . Restless leg syndrome   . Rosacea   . Stroke [facility])    tia's x3 last 4 yrs ago  . SUI (stress urinary incontinence, female)     Past Surgical History:  Procedure Laterality Date  . ANTERIOR CERVICAL DECOMP/DISCECTOMY FUSION N/A 10/25/2017   Procedure: CERVICAL SIX- CERVICAL SEVEN ANTERIOR CERVICAL DECOMPRESSION/DISCECTOMY FUSION, INTERBODY PROSTHESIS AND ANTERIOR PLATING;  Surgeon: Newman Pies, MD;  Location: [facility];  Service: Neurosurgery;   Laterality: N/A;  CERVICAL 6- CERVICAL 7 ANTERIOR CERVICAL DECOMPRESSION/DISCECTOMY FUSION, INTERBODY PROSTHESIS AND ANTERIOR PLATING  . BILATERAL BENIGN BREAST BX'S  20 YRS AGO  . BREAST BIOPSY Right    benign  . BREAST EXCISIONAL BIOPSY Bilateral over 10 years ago   benign  . CARDIAC CATHETERIZATION N/A 08/13/2016   Procedure: Right Heart Cath;  Surgeon: Larey Dresser, MD;  Location: [place] CV [facility];  Service: Cardiovascular;  Laterality: N/A;  . CARPAL TUNNEL RELEASE  2009   BILATERAL  . CYSTOCELE REPAIR  12/11/2011   Procedure: ANTERIOR REPAIR (CYSTOCELE);  Surgeon: Claybon Jabs, MD;  [facility];  Service: Urology;  Laterality: N/A;  1 1/2 hour requested for case  Anterior repair and mid Urethral Sling  . NASAL SEPTOPLASTY W/ TURBINOPLASTY Bilateral 11/18/2016   Procedure: NASAL SEPTOPLASTY WITH BILATERAL TURBINATE REDUCTION;  Surgeon: Jerrell Belfast, MD;  Location: [facility];  Service: ENT;  Laterality: Bilateral;  . NONINVASIVE VASCULAR CAROTID STUDY  09-14-2007   BILATERAL MILD MIX PLAQUE THROUGHOUT, NO SIGNIFICANT BILATERAL ICA STENOSIS  . PUBOVAGINAL SLING  12/11/2011   Procedure: Gaynelle Arabian;  Surgeon: Claybon Jabs, MD;  Location: [facility];  Service: Urology;  Laterality: N/A;  . PULLEY RELEASE  RIGHT THUMB  2009  . SINUS ENDO WITH FUSION Bilateral 11/18/2016   Procedure: BILATERAL ENDOSCOPIC SINUS SURGERY;  Surgeon: Jerrell Belfast, MD;  Location: [place];  Service: ENT;  Laterality: Bilateral;  . VAGINAL HYSTERECTOMY  AGE 73    There were no vitals filed for this visit.  Subjective Assessment - 03/08/19 1418    Subjective  Pt states that she drove today. R shoulder was ok. No pain in R shoulder currently.     Pertinent History  S/P R rotator cuff repair on 01/17/2019. Her next MD appointment is this Friday. Did not wear her sling today because she did not want it exposed to COVID-19. Did not wear her sling much yesterday and  it woke her up in the middle of the night.  Has been doing her pendulums at home.  Has not had PT since her surgery.  Pt is L hand dominant.  Pt states that the surgery went well.     Patient Stated Goals  Be able to use her R arm and be functional like before (be able to raise her arm up and pick up her 72 year old grandson)    Currently in Pain?  No/denies    Pain Score  0-No pain    Pain Onset  More than a month ago                               PT Education - 03/08/19 1454    Education Details  ther-ex    Person(s) Educated  Patient    Methods  Explanation;Demonstration;Tactile cues;Verbal cues    Comprehension  Returned demonstration;Verbalized understanding      Objective   S/P 7 weeks  No latex band allergies  Medbridge Access Code: [email or web address]    Manual therapy  Supine STM R teres major muscle Supine STM R pectiralis major distal fibers    140 degrees supine flexion AAROM improvement afterwards.   Therapeutic exercise  Seated R shoulder AAROM using pulley             Flexion 10x3             scaption 10x3             Abduction 10x3               Supine R shoulder flexion AAROM 136 degrees     Supine R shoulder ER in scapular plane AAROM with dowel 10x3            .  Bent over R scapular retraction             10x5 seconds for 2 sets     Improved exercise technique, movement at target joints, use of target muscles after mod verbal, visual, tactile cues.     Response to treatment Good stretch felt with exercises, no pain. Improved supine R shoulder flexion AAROM following soft tissue mobilization to decrease R teres major muscle tension and pectoralis muscle tension. Pt tolerated session well without aggravation of symptoms.     Clinical impression Continued working on R shoulder AAROM to decrease stiffness and improve ability to raise her R arm. Reviewed HEP as well secondary to pt needing more practice to perform well  at home. No complain of pain throughout session. Improved R shoudler flexion AAROM following manual therapy to decrease teres major and pectoralis major muscle tension. Pt also demonstrates consistent overall decrease in R shoulder  pain compared to previous sessions. Pt will benefit from continued skilled physical therapy services to improve ROM, strength, and function.    PT Short Term Goals - 02/28/19 1548      PT SHORT TERM GOAL #1   Title  Patient will be independent with her HEP to promote ROM, strength, and function.     Baseline  Pt has started her HEP (02/28/2019)    Time  3    Period  Weeks    Status  New    Target Date  03/23/19        PT Long Term Goals - 02/28/19 1549      PT LONG TERM GOAL #1   Title  Patient will improve R shoulder flexion and scaption AROM to 140 degrees or more to promote ability to reach.     Baseline  Supine R shoulder AAROM 113 degrees flexion, 120 degrees scaption (02/28/2019)    Time  8    Period  Weeks    Status  New    Target Date  04/27/19      PT LONG TERM GOAL #2   Title  Patient will improve R shoulder ER and IR AROM to 80 degrees or more to promote ability to reach behind her back as well as behind her head.     Baseline  Supine R shoulder AAROM in scapular plane: 53 degrees ER, 66 degrees IR (02/28/2019)     Time  8    Period  Weeks    Status  New    Target Date  04/27/19      PT LONG TERM GOAL #3   Title  Patient will have at least 4+/5 R shoulder strength (flexion, scaption, ER, IR) to promote ability to raise her R arm, as well as to use it to perform functional tasks.     Baseline  R shoulder strength not yet tested (02/28/2019)    Time  8    Period  Weeks    Status  New    Target Date  04/27/19            Plan - 03/08/19 1454    Clinical Impression Statement  Continued working on R shoulder AAROM to decrease stiffness and improve ability to raise her R arm. Reviewed HEP as well secondary to pt needing more practice to  perform well at home. No complain of pain throughout session. Improved R shoudler flexion AAROM following manual therapy to decrease teres major and pectoralis major muscle tension. Pt also demonstrates consistent overall decrease in R shoulder pain compared to previous sessions. Pt will benefit from continued skilled physical therapy services to improve ROM, strength, and function.     Personal Factors and Comorbidities  Age;Comorbidity 1    Comorbidities  CVA hx    Examination-Activity Limitations  Bathing;Carry;Dressing;Hygiene/Grooming;Lift;Reach Overhead;Toileting    Stability/Clinical Decision Making  Stable/Uncomplicated   Decreased R shoulder pain after session   Rehab Potential  Good    PT Frequency  2x / week    PT Duration  8 weeks    PT Treatment/Interventions  Therapeutic exercise;Therapeutic activities;Neuromuscular re-education;Patient/family education;Manual techniques;Dry needling;Electrical Stimulation;Iontophoresis 4mg /ml Dexamethasone    PT Next Visit Plan  PROM, AAROM, scapular strengthening, AROM and strengthening when appropriate, follow protocol    Consulted and Agree with Plan of Care  Patient       Patient will benefit from skilled therapeutic intervention in order to improve the following deficits and impairments:  Pain, Postural dysfunction, Impaired  UE functional use, Improper body mechanics, Decreased strength, Decreased range of motion  Visit Diagnosis: Acute pain of right shoulder  Muscle weakness (generalized)  Stiffness of right shoulder, not elsewhere classified     Problem List Patient Active Problem List   Diagnosis Date Noted  . Cervical spondylosis with radiculopathy 10/25/2017  . Sinusitis, chronic 11/18/2016  . Dyspnea and respiratory abnormality 05/21/2016  . Wheeze 05/21/2016  . Chronic throat clearing 05/21/2016  . Pulmonary HTN ([place]) 04/23/2016  . Moderate persistent asthma 11/12/2015  . Allergic rhinoconjunctivitis 11/12/2015  .  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  . SOB (shortness of breath) 03/06/2015  . OSA (obstructive sleep apnea) 01/01/2014  . Benign essential HTN 01/01/2014  . Obesity 01/01/2014    Joneen Boers PT, DPT   03/08/2019, 4:12 PM  [facility] [address] [place], [address] Phone: [phone]   Fax:  [phone]  Name: GRAVIELA NODAL MRN: [id number] Date of Birth: 06/11/1946

## 2019-03-14 ENCOUNTER — Ambulatory Visit: Payer: Medicare Other

## 2019-03-14 DIAGNOSIS — M25511 Pain in right shoulder: Secondary | ICD-10-CM

## 2019-03-14 DIAGNOSIS — M25611 Stiffness of right shoulder, not elsewhere classified: Secondary | ICD-10-CM

## 2019-03-14 DIAGNOSIS — M6281 Muscle weakness (generalized): Secondary | ICD-10-CM

## 2019-03-14 NOTE — Patient Instructions (Signed)
Seated bicep curls   Sitting on a chair with your right arm propped slightly forward   Bend your right forearm (palms up)   1 lb weight.    10 times for 2 sets comfortably every other day.

## 2019-03-14 NOTE — Therapy (Signed)
[facility] [address] [place], [address] Phone: [phone]   Fax:  [phone]  Physical Therapy Treatment  Patient Details  Name: Amy Vang MRN: [id number] Date of Birth: December 04, 1945 Referring Provider (PT): Justice Britain, MD   Encounter Date: 03/14/2019  PT End of Session - 03/14/19 1302    Visit Number  4    Number of Visits  17    Date for PT Re-Evaluation  04/27/19    Authorization Type  4    Authorization Time Period  of 10 medicare (02/28/2019 eval)    PT Start Time  1303    PT Stop Time  1349    PT Time Calculation (min)  46 min    Activity Tolerance  Patient tolerated treatment well    Behavior During Therapy  [facility] for tasks assessed/performed       Past Medical History:  Diagnosis Date  . Acid reflux   . Actinic keratosis    Precancerous  . Allergic rhinitis   . Anxiety   . Asthma   . Breast mass   . CVA (cerebral infarction)   . Cystocele   . Degenerative disc disease, cervical   . Diastolic dysfunction   . GERD (gastroesophageal reflux disease) PT STATES SEVERE---- CONTROLLED W/ NEXIUM  . History of hiatal hernia   . History of TIAs AGE 73  AND 2008---  NO RESIDUAL  . Hypertension   . Non-specific gastrointestinal complaint   . OSA (obstructive sleep apnea)    Severe CPAP 12 MM H2O- Dr Radford Pax  . Pneumonia    hx  . PONV (postoperative nausea and vomiting)   . Pulmonary HTN ([place])    Moderate with PASP 22mmHg by echo 03/2016  . Restless leg syndrome   . Rosacea   . Stroke [facility])    tia's x3 last 4 yrs ago  . SUI (stress urinary incontinence, female)     Past Surgical History:  Procedure Laterality Date  . ANTERIOR CERVICAL DECOMP/DISCECTOMY FUSION N/A 10/25/2017   Procedure: CERVICAL SIX- CERVICAL SEVEN ANTERIOR CERVICAL DECOMPRESSION/DISCECTOMY FUSION, INTERBODY PROSTHESIS AND ANTERIOR PLATING;  Surgeon: Newman Pies, MD;  Location: [place];  Service:  Neurosurgery;  Laterality: N/A;  CERVICAL 6- CERVICAL 7 ANTERIOR CERVICAL DECOMPRESSION/DISCECTOMY FUSION, INTERBODY PROSTHESIS AND ANTERIOR PLATING  . BILATERAL BENIGN BREAST BX'S  20 YRS AGO  . BREAST BIOPSY Right    benign  . BREAST EXCISIONAL BIOPSY Bilateral over 10 years ago   benign  . CARDIAC CATHETERIZATION N/A 08/13/2016   Procedure: Right Heart Cath;  Surgeon: Larey Dresser, MD;  Location: [place] [facility];  Service: Cardiovascular;  Laterality: N/A;  . CARPAL TUNNEL RELEASE  2009   BILATERAL  . CYSTOCELE REPAIR  12/11/2011   Procedure: ANTERIOR REPAIR (CYSTOCELE);  Surgeon: Claybon Jabs, MD;  Location: [facility];  Service: Urology;  Laterality: N/A;  1 1/2 hour requested for case  Anterior repair and mid Urethral Sling  . NASAL SEPTOPLASTY W/ TURBINOPLASTY Bilateral 11/18/2016   Procedure: NASAL SEPTOPLASTY WITH BILATERAL TURBINATE REDUCTION;  Surgeon: Jerrell Belfast, MD;  Location: [place];  Service: ENT;  Laterality: Bilateral;  . NONINVASIVE VASCULAR CAROTID STUDY  09-14-2007   BILATERAL MILD MIX PLAQUE THROUGHOUT, NO SIGNIFICANT BILATERAL ICA STENOSIS  . PUBOVAGINAL SLING  12/11/2011   Procedure: Gaynelle Arabian;  Surgeon: Claybon Jabs, MD;  Location: [facility];  Service: Urology;  Laterality: N/A;  . PULLEY RELEASE  RIGHT THUMB  2009  . SINUS ENDO WITH FUSION Bilateral 11/18/2016   Procedure: BILATERAL ENDOSCOPIC SINUS SURGERY;  Surgeon: Jerrell Belfast, MD;  Location: [place];  Service: ENT;  Laterality: Bilateral;  . VAGINAL HYSTERECTOMY  AGE 73    There were no vitals filed for this visit.  Subjective Assessment - 03/14/19 1305    Subjective  R shoulder doing great. No pain.     Pertinent History  S/P R rotator cuff repair on 01/17/2019. Her next MD appointment is this Friday. Did not wear her sling today because she did not want it exposed to COVID-19. Did not wear her sling much yesterday and it woke her up in the middle of the  night.  Has been doing her pendulums at home.  Has not had PT since her surgery.  Pt is L hand dominant.  Pt states that the surgery went well.     Patient Stated Goals  Be able to use her R arm and be functional like before (be able to raise her arm up and pick up her 19 year old grandson)    Currently in Pain?  No/denies    Pain Score  0-No pain    Pain Onset  More than a month ago                               PT Education - 03/14/19 1347    Education Details  ther-ex, HEP    Person(s) Educated  Patient    Methods  Explanation;Demonstration;Tactile cues;Verbal cues;Handout    Comprehension  Returned demonstration;Verbalized understanding        Objective   S/P8weeks  No latex band allergies  MedbridgeAccess Code: [email or web address]   Manual therapy  Supine STM R teres major muscle Supine STM R pectoralis major distal fibers    150 degrees supine flexion AAROM improvement afterwards.   Therapeutic exercise   Supine R shoulder flexion AROM 5x (added supine AROM secondary to pt being 8 weeks post op per protocol)  Slight R shoulder discomfort which eases with rest  Supine AAROM with L UE for flexion 10x  Supine R shoulder AAROM with PT  scaption 10x3  Abduction 10x3  Ball on table ABC's 1x   Then lower case abc's 1x  R bicep curls 1 lbs 10x3 with R arm propped in slight flexion for R shoulder comfort  Improved exercise technique, movement at target joints, use of target muscles after mod verbal, visual, tactile cues.        Response to treatment Good stretch felt with exercises, no pain. Improved supine R shoulder flexion AAROM following soft tissue mobilization to decrease R teres major muscle tension and pectoralis muscle tension. Pt tolerated session well without aggravation of symptoms.     Clinical impression Continued working on R shoulder AAROM to prevent stiffness and improve ability to raise her R arm. Worked on  supine R shoulder AROM 5x since pt is 8 weeks post op based on protocol but stopped secondary to discomfort (eases with rest). May reintroduce supine AROM again next visit if appropriate. Improving R shoulder ROM observed overall. Pt tolerated session well without aggravation of symptoms. Pt will benefit from continued skilled physical therapy services to improve ROM, strength and function.            PT Short Term Goals - 02/28/19 1548      PT SHORT TERM GOAL #1   Title  Patient will be independent with her HEP to promote ROM, strength, and function.     Baseline  Pt has started her HEP (02/28/2019)    Time  3    Period  Weeks    Status  New    Target Date  03/23/19        PT Long Term Goals - 02/28/19 1549      PT LONG TERM GOAL #1   Title  Patient will improve R shoulder flexion and scaption AROM to 140 degrees or more to promote ability to reach.     Baseline  Supine R shoulder AAROM 113 degrees flexion, 120 degrees scaption (02/28/2019)    Time  8    Period  Weeks    Status  New    Target Date  04/27/19      PT LONG TERM GOAL #2   Title  Patient will improve R shoulder ER and IR AROM to 80 degrees or more to promote ability to reach behind her back as well as behind her head.     Baseline  Supine R shoulder AAROM in scapular plane: 53 degrees ER, 66 degrees IR (02/28/2019)     Time  8    Period  Weeks    Status  New    Target Date  04/27/19      PT LONG TERM GOAL #3   Title  Patient will have at least 4+/5 R shoulder strength (flexion, scaption, ER, IR) to promote ability to raise her R arm, as well as to use it to perform functional tasks.     Baseline  R shoulder strength not yet tested (02/28/2019)    Time  8    Period  Weeks    Status  New    Target Date  04/27/19            Plan - 03/14/19 1300    Clinical Impression Statement  Continued working on R shoulder AAROM to prevent stiffness and improve ability to raise her R arm. Worked on supine R shoulder  AROM 5x since pt is 8 weeks post op based on protocol but stopped secondary to discomfort (eases with rest). May reintroduce supine AROM again next visit if appropriate. Improving R shoulder ROM observed overall. Pt tolerated session well without aggravation of symptoms. Pt will benefit from continued skilled physical therapy services to improve ROM, strength and function.     Personal Factors and Comorbidities  Age;Comorbidity 1    Comorbidities  CVA hx    Examination-Activity Limitations  Bathing;Carry;Dressing;Hygiene/Grooming;Lift;Reach Overhead;Toileting    Stability/Clinical Decision Making  Stable/Uncomplicated   Decreased R shoulder pain after session   Rehab Potential  Good    PT Frequency  2x / week    PT Duration  8 weeks    PT Treatment/Interventions  Therapeutic exercise;Therapeutic activities;Neuromuscular re-education;Patient/family education;Manual techniques;Dry needling;Electrical Stimulation;Iontophoresis 4mg /ml Dexamethasone    PT Next Visit Plan  PROM, AAROM, scapular strengthening, AROM and strengthening when appropriate, follow protocol    Consulted and Agree with Plan of Care  Patient       Patient will benefit from skilled therapeutic intervention in order to improve the following deficits and impairments:  Pain, Postural dysfunction, Impaired UE functional use, Improper body mechanics, Decreased strength, Decreased range of motion  Visit Diagnosis: Acute pain of right shoulder  Muscle weakness (generalized)  Stiffness of right shoulder, not elsewhere classified     Problem List Patient Active Problem List   Diagnosis Date Noted  .  Cervical spondylosis with radiculopathy 10/25/2017  . Sinusitis, chronic 11/18/2016  . Dyspnea and respiratory abnormality 05/21/2016  . Wheeze 05/21/2016  . Chronic throat clearing 05/21/2016  . Pulmonary HTN ([place]) 04/23/2016  . Moderate persistent asthma 11/12/2015  . Allergic rhinoconjunctivitis 11/12/2015  . Irritable  larynx 11/12/2015  . SOB (shortness of breath) 03/06/2015  . OSA (obstructive sleep apnea) 01/01/2014  . Benign essential HTN 01/01/2014  . Obesity 01/01/2014    Joneen Boers PT, DPT   03/14/2019, 2:15 PM   [facility] [address] [place], [address] Phone: [phone]   Fax:  [phone]  Name: Amy Vang MRN: [id number] Date of Birth: 12/02/1945

## 2019-03-16 ENCOUNTER — Ambulatory Visit: Payer: Medicare Other

## 2019-03-16 ENCOUNTER — Telehealth: Payer: Self-pay | Admitting: Cardiology

## 2019-03-16 ENCOUNTER — Other Ambulatory Visit: Payer: Self-pay

## 2019-03-16 DIAGNOSIS — M25611 Stiffness of right shoulder, not elsewhere classified: Secondary | ICD-10-CM

## 2019-03-16 DIAGNOSIS — M6281 Muscle weakness (generalized): Secondary | ICD-10-CM

## 2019-03-16 DIAGNOSIS — M25511 Pain in right shoulder: Secondary | ICD-10-CM | POA: Diagnosis not present

## 2019-03-16 NOTE — Telephone Encounter (Signed)
Follow Up:    Returning your call. She said she is no longer Dr Theodosia Blender patient.

## 2019-03-16 NOTE — Patient Instructions (Addendum)
Seated bicep curls   Sitting on a chair with your right arm propped slightly forward              Bend your right forearm (palms up)              1 lb weight.               10 times for 2 sets comfortably every other day.      Standing R UE pillow case slide up the wall AAROM  10x flexion  10x scaption   Reviewed and given as part of her HEP. Pt demonstrated and verbalized understanding. Handout provided.

## 2019-03-16 NOTE — Therapy (Signed)
[place] [facility] [address] [place], [address] Phone: [phone]   Fax:  [phone]  Physical Therapy Treatment  Patient Details  Name: Amy Vang MRN: [id number] Date of Birth: 07/17/1946 Referring Provider (PT): Justice Britain, MD   Encounter Date: 03/16/2019  PT End of Session - 03/16/19 1104    Visit Number  5    Number of Visits  17    Date for PT Re-Evaluation  04/27/19    Authorization Type  5    Authorization Time Period  of 10 medicare (02/28/2019 eval)    PT Start Time  1104    PT Stop Time  1150    PT Time Calculation (min)  46 min    Activity Tolerance  Patient tolerated treatment well    Behavior During Therapy  [facility] for tasks assessed/performed       Past Medical History:  Diagnosis Date  . Acid reflux   . Actinic keratosis    Precancerous  . Allergic rhinitis   . Anxiety   . Asthma   . Breast mass   . CVA (cerebral infarction)   . Cystocele   . Degenerative disc disease, cervical   . Diastolic dysfunction   . GERD (gastroesophageal reflux disease) PT STATES SEVERE---- CONTROLLED W/ NEXIUM  . History of hiatal hernia   . History of TIAs AGE 73  AND 2008---  NO RESIDUAL  . Hypertension   . Non-specific gastrointestinal complaint   . OSA (obstructive sleep apnea)    Severe CPAP 12 MM H2O- Dr Radford Pax  . Pneumonia    hx  . PONV (postoperative nausea and vomiting)   . Pulmonary HTN ([place])    Moderate with PASP 65mmHg by echo 03/2016  . Restless leg syndrome   . Rosacea   . Stroke [facility])    tia's x3 last 4 yrs ago  . SUI (stress urinary incontinence, female)     Past Surgical History:  Procedure Laterality Date  . ANTERIOR CERVICAL DECOMP/DISCECTOMY FUSION N/A 10/25/2017   Procedure: CERVICAL SIX- CERVICAL SEVEN ANTERIOR CERVICAL DECOMPRESSION/DISCECTOMY FUSION, INTERBODY PROSTHESIS AND ANTERIOR PLATING;  Surgeon: Newman Pies, MD;  Location: [place];  Service:  Neurosurgery;  Laterality: N/A;  CERVICAL 6- CERVICAL 7 ANTERIOR CERVICAL DECOMPRESSION/DISCECTOMY FUSION, INTERBODY PROSTHESIS AND ANTERIOR PLATING  . BILATERAL BENIGN BREAST BX'S  20 YRS AGO  . BREAST BIOPSY Right    benign  . BREAST EXCISIONAL BIOPSY Bilateral over 10 years ago   benign  . CARDIAC CATHETERIZATION N/A 08/13/2016   Procedure: Right Heart Cath;  Surgeon: Larey Dresser, MD;  Location: [place] CV LAB;  Service: Cardiovascular;  Laterality: N/A;  . CARPAL TUNNEL RELEASE  2009   BILATERAL  . CYSTOCELE REPAIR  12/11/2011   Procedure: ANTERIOR REPAIR (CYSTOCELE);  Surgeon: Claybon Jabs, MD;  Location: [facility];  Service: Urology;  Laterality: N/A;  1 1/2 hour requested for case  Anterior repair and mid Urethral Sling  . NASAL SEPTOPLASTY W/ TURBINOPLASTY Bilateral 11/18/2016   Procedure: NASAL SEPTOPLASTY WITH BILATERAL TURBINATE REDUCTION;  Surgeon: Jerrell Belfast, MD;  Location: [place];  Service: ENT;  Laterality: Bilateral;  . NONINVASIVE VASCULAR CAROTID STUDY  09-14-2007   BILATERAL MILD MIX PLAQUE THROUGHOUT, NO SIGNIFICANT BILATERAL ICA STENOSIS  . PUBOVAGINAL SLING  12/11/2011   Procedure: Gaynelle Arabian;  Surgeon: Claybon Jabs, MD;  Location: [facility];  Service: Urology;  Laterality: N/A;  . PULLEY RELEASE  RIGHT THUMB  2009  . SINUS ENDO WITH FUSION Bilateral 11/18/2016   Procedure: BILATERAL ENDOSCOPIC SINUS SURGERY;  Surgeon: Jerrell Belfast, MD;  Location: [place];  Service: ENT;  Laterality: Bilateral;  . VAGINAL HYSTERECTOMY  AGE 48    There were no vitals filed for this visit.  Subjective Assessment - 03/16/19 1106    Subjective  R shoulder feels good. No problem.    Pertinent History  S/P R rotator cuff repair on 01/17/2019. Her next MD appointment is this Friday. Did not wear her sling today because she did not want it exposed to COVID-19. Did not wear her sling much yesterday and it woke her up in the middle of the  night.  Has been doing her pendulums at home.  Has not had PT since her surgery.  Pt is L hand dominant.  Pt states that the surgery went well.     Patient Stated Goals  Be able to use her R arm and be functional like before (be able to raise her arm up and pick up her 67 year old grandson)    Currently in Pain?  No/denies    Pain Score  0-No pain    Pain Onset  More than a month ago                               PT Education - 03/16/19 1128    Education Details  ther-ex, HEP    Person(s) Educated  Patient    Methods  Explanation;Demonstration;Tactile cues;Verbal cues;Handout    Comprehension  Returned demonstration;Verbalized understanding      Objective   S/P8weeks  No latex band allergies  MedbridgeAccess Code: [email or web address]   Manual therapy  Supine STM R teres major muscle Supine STM R pectoralis major distal fibers    Therapeutic exercise    Supine R shoulder AAROM with PT  Flexion 10x3             scaption 10x3             Abduction 10x3     At scapular plane:  ER 10x   IR 10x3  R bicep curls 1 lbs 10x with R arm propped in slight flexion for R shoulder comfort  Standing R UE pillow case slide up the wall AAROM  10x flexion  10x scaption  Reviewed and given as part of her HEP. Pt demonstrated and verbalized understanding. Handout provided.     Improved exercise technique, movement at target joints, use of target muscles after mod verbal, visual, tactile cues.       Response to treatment Good stretch felt with exercises, no pain.Pt tolerated session well without aggravation of symptoms.     Clinical impression Pt currently 8 weeks post op. Continued working on R shoulder AAROM both in supine and in standing to decrease stiffness and improve ability to raise her R arm up against gravity as well as to improve ability to reach behind her head and back. Pt tolerated session well without aggravation of  symptoms. Pt will benefit from continued skilled physical therapy services to improve ROM, strength, and function.        PT Short Term Goals - 02/28/19 1548      PT SHORT TERM GOAL #1   Title  Patient will be independent with her HEP to promote ROM, strength, and function.     Baseline  Pt has started her HEP (02/28/2019)  Time  3    Period  Weeks    Status  New    Target Date  03/23/19        PT Long Term Goals - 02/28/19 1549      PT LONG TERM GOAL #1   Title  Patient will improve R shoulder flexion and scaption AROM to 140 degrees or more to promote ability to reach.     Baseline  Supine R shoulder AAROM 113 degrees flexion, 120 degrees scaption (02/28/2019)    Time  8    Period  Weeks    Status  New    Target Date  04/27/19      PT LONG TERM GOAL #2   Title  Patient will improve R shoulder ER and IR AROM to 80 degrees or more to promote ability to reach behind her back as well as behind her head.     Baseline  Supine R shoulder AAROM in scapular plane: 53 degrees ER, 66 degrees IR (02/28/2019)     Time  8    Period  Weeks    Status  New    Target Date  04/27/19      PT LONG TERM GOAL #3   Title  Patient will have at least 4+/5 R shoulder strength (flexion, scaption, ER, IR) to promote ability to raise her R arm, as well as to use it to perform functional tasks.     Baseline  R shoulder strength not yet tested (02/28/2019)    Time  8    Period  Weeks    Status  New    Target Date  04/27/19            Plan - 03/16/19 1104    Clinical Impression Statement  Pt currently 8 weeks post op. Continued working on R shoulder AAROM both in supine and in standing to decrease stiffness and improve ability to raise her R arm up against gravity as well as to improve ability to reach behind her head and back. Pt tolerated session well without aggravation of symptoms. Pt will benefit from continued skilled physical therapy services to improve ROM, strength, and function.      Personal Factors and Comorbidities  Age;Comorbidity 1    Comorbidities  CVA hx    Examination-Activity Limitations  Bathing;Carry;Dressing;Hygiene/Grooming;Lift;Reach Overhead;Toileting    Stability/Clinical Decision Making  Stable/Uncomplicated   Decreased R shoulder pain after session   Rehab Potential  Good    PT Frequency  2x / week    PT Duration  8 weeks    PT Treatment/Interventions  Therapeutic exercise;Therapeutic activities;Neuromuscular re-education;Patient/family education;Manual techniques;Dry needling;Electrical Stimulation;Iontophoresis 4mg /ml Dexamethasone    PT Next Visit Plan  PROM, AAROM, scapular strengthening, AROM and strengthening when appropriate, follow protocol    Consulted and Agree with Plan of Care  Patient       Patient will benefit from skilled therapeutic intervention in order to improve the following deficits and impairments:  Pain, Postural dysfunction, Impaired UE functional use, Improper body mechanics, Decreased strength, Decreased range of motion  Visit Diagnosis: Acute pain of right shoulder  Muscle weakness (generalized)  Stiffness of right shoulder, not elsewhere classified     Problem List Patient Active Problem List   Diagnosis Date Noted  . Cervical spondylosis with radiculopathy 10/25/2017  . Sinusitis, chronic 11/18/2016  . Dyspnea and respiratory abnormality 05/21/2016  . Wheeze 05/21/2016  . Chronic throat clearing 05/21/2016  . Pulmonary HTN ([place]) 04/23/2016  . Moderate persistent asthma 11/12/2015  .  Allergic rhinoconjunctivitis 11/12/2015  . 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  . SOB (shortness of breath) 03/06/2015  . OSA (obstructive sleep apnea) 01/01/2014  . Benign essential HTN 01/01/2014  . Obesity 01/01/2014    Joneen Boers PT, DPT   03/16/2019, 12:14 PM  [place] [facility] [address] [place], [address] Phone: [phone]   Fax:   [phone]  Name: Amy Vang MRN: [id number] Date of Birth: 1946-09-01

## 2019-03-21 ENCOUNTER — Other Ambulatory Visit: Payer: Self-pay

## 2019-03-21 ENCOUNTER — Ambulatory Visit: Payer: Medicare Other

## 2019-03-21 DIAGNOSIS — M25611 Stiffness of right shoulder, not elsewhere classified: Secondary | ICD-10-CM

## 2019-03-21 DIAGNOSIS — M6281 Muscle weakness (generalized): Secondary | ICD-10-CM

## 2019-03-21 DIAGNOSIS — M25511 Pain in right shoulder: Secondary | ICD-10-CM | POA: Diagnosis not present

## 2019-03-21 NOTE — Therapy (Signed)
[place] [facility] [address] [place], [address] Phone: [phone]   Fax:  [phone]  Physical Therapy Treatment  Patient Details  Name: Amy Vang MRN: [id number] Date of Birth: [birth year] Referring Provider (PT): Justice Britain, MD   Encounter Date: 03/21/2019  PT End of Session - 03/21/19 1401    Visit Number  6    Number of Visits  17    Date for PT Re-Evaluation  04/27/19    Authorization Type  6    Authorization Time Period  of 10 medicare (02/28/2019 eval)    PT Start Time  1400    PT Stop Time  1444    PT Time Calculation (min)  44 min    Activity Tolerance  Patient tolerated treatment well    Behavior During Therapy  [facility] for tasks assessed/performed       Past Medical History:  Diagnosis Date  . Acid reflux   . Actinic keratosis    Precancerous  . Allergic rhinitis   . Anxiety   . Asthma   . Breast mass   . CVA (cerebral infarction)   . Cystocele   . Degenerative disc disease, cervical   . Diastolic dysfunction   . GERD (gastroesophageal reflux disease) PT STATES SEVERE---- CONTROLLED W/ NEXIUM  . History of hiatal hernia   . History of TIAs AGE 73  AND 2008---  NO RESIDUAL  . Hypertension   . Non-specific gastrointestinal complaint   . OSA (obstructive sleep apnea)    Severe CPAP 12 MM H2O- Dr Radford Pax  . Pneumonia    hx  . PONV (postoperative nausea and vomiting)   . Pulmonary HTN ([place])    Moderate with PASP 46mmHg by echo 03/2016  . Restless leg syndrome   . Rosacea   . Stroke [facility])    tia's x3 last 4 yrs ago  . SUI (stress urinary incontinence, female)     Past Surgical History:  Procedure Laterality Date  . ANTERIOR CERVICAL DECOMP/DISCECTOMY FUSION N/A 10/25/2017   Procedure: CERVICAL SIX- CERVICAL SEVEN ANTERIOR CERVICAL DECOMPRESSION/DISCECTOMY FUSION, INTERBODY PROSTHESIS AND ANTERIOR PLATING;  Surgeon: Newman Pies, MD;  Location: [place];  Service:  Neurosurgery;  Laterality: N/A;  CERVICAL 6- CERVICAL 7 ANTERIOR CERVICAL DECOMPRESSION/DISCECTOMY FUSION, INTERBODY PROSTHESIS AND ANTERIOR PLATING  . BILATERAL BENIGN BREAST BX'S  20 YRS AGO  . BREAST BIOPSY Right    benign  . BREAST EXCISIONAL BIOPSY Bilateral over 10 years ago   benign  . CARDIAC CATHETERIZATION N/A 08/13/2016   Procedure: Right Heart Cath;  Surgeon: Larey Dresser, MD;  Location: [place] [facility];  Service: Cardiovascular;  Laterality: N/A;  . CARPAL TUNNEL RELEASE  2009   BILATERAL  . CYSTOCELE REPAIR  12/11/2011   Procedure: ANTERIOR REPAIR (CYSTOCELE);  Surgeon: Claybon Jabs, MD;  [facility];  Service: Urology;  Laterality: N/A;  1 1/2 hour requested for case  Anterior repair and mid Urethral Sling  . NASAL SEPTOPLASTY W/ TURBINOPLASTY Bilateral 11/18/2016   Procedure: NASAL SEPTOPLASTY WITH BILATERAL TURBINATE REDUCTION;  Surgeon: Jerrell Belfast, MD;  Location: [place];  Service: ENT;  Laterality: Bilateral;  . NONINVASIVE VASCULAR CAROTID STUDY  09-14-2007   BILATERAL MILD MIX PLAQUE THROUGHOUT, NO SIGNIFICANT BILATERAL ICA STENOSIS  . PUBOVAGINAL SLING  12/11/2011   Procedure: Gaynelle Arabian;  Surgeon: Claybon Jabs, MD;  Location: [facility];  Service: Urology;  Laterality: N/A;  . PULLEY RELEASE  RIGHT THUMB  2009  . SINUS ENDO WITH FUSION Bilateral 11/18/2016   Procedure: BILATERAL ENDOSCOPIC SINUS SURGERY;  Surgeon: Jerrell Belfast, MD;  Location: [place];  Service: ENT;  Laterality: Bilateral;  . VAGINAL HYSTERECTOMY  AGE 27    There were no vitals filed for this visit.  Subjective Assessment - 03/21/19 1401    Subjective  Has been working on squeezing her shoulder blades. R shoulder is good. No pain or discomfort.     Pertinent History  S/P R rotator cuff repair on 01/17/2019. Her next MD appointment is this Friday. Did not wear her sling today because she did not want it exposed to COVID-19. Did not wear her  sling much yesterday and it woke her up in the middle of the night.  Has been doing her pendulums at home.  Has not had PT since her surgery.  Pt is L hand dominant.  Pt states that the surgery went well.     Patient Stated Goals  Be able to use her R arm and be functional like before (be able to raise her arm up and pick up her 104 year old grandson)    Currently in Pain?  No/denies    Pain Score  0-No pain    Pain Onset  More than a month ago                               PT Education - 03/21/19 1433    Education Details  Ther-ex, HEP    Person(s) Educated  Patient    Methods  Explanation;Demonstration;Tactile cues;Verbal cues;Handout    Comprehension  Returned demonstration;Verbalized understanding      Objective   S/P9weeks  No latex band allergies  MedbridgeAccess Code: [email or web address]   Manual therapy  Supine STM R teres major muscle Supine STM R pectoralis major distal fibers    Therapeutic exercise  Supine R shoulder AROM  flexion to 90 degrees starting with elbow bent 10x   Then flexion to end range starting with elbow bent 10x2    Reviewed and given as part of her HEP  150 degrees supine R shoulder flexion AROM  S/L R shoulder ER AROM 10x3 S/L R shoulder abduction to 90 degrees 10x3   Ball wall   ABC's 1x  abc's 1x   Standing R UE pillow case slide up the wall AAROM with isometric step back holds              10x 3 seconds flexion             10x 3 seconds scaption            R lateral arm discomfort around C5/6 dermatome/radial nerve area. Eases with rest  Tense B cervical paraspinal muscles palpated  Standing R shoulder flexion AROM in front of the mirror 1-2x  132 degrees flexion AROM in standing  122 degrees scaption AROM in standing     Improved exercise technique, movement at target joints, use of target muscles after min to mod verbal, visual, tactile cues.  Response to treatment Good stretch as well as  muscle use felt with exercises, no pain.Pt tolerated session well without aggravation of symptoms. Improving ROM R shoulder.     Clinical impression Implemented AROM exercises today per protocol as patient is currently 9 weeks post op. No discomfort with the AROM exercises but good muscle use felt. Cues provided to maintain proper scapular positioning  with arm movements. Pt able to achieve 132 degrees, and 122 degrees standing R shoulder flexion and scaption AROM respectively. Pt making progress with AROM. Pt tolerated session well without aggravation of symptoms. Pt will benefit from continued skilled physical therapy sessions to promote ROM, strength and function.       PT Short Term Goals - 02/28/19 1548      PT SHORT TERM GOAL #1   Title  Patient will be independent with her HEP to promote ROM, strength, and function.     Baseline  Pt has started her HEP (02/28/2019)    Time  3    Period  Weeks    Status  New    Target Date  03/23/19        PT Long Term Goals - 02/28/19 1549      PT LONG TERM GOAL #1   Title  Patient will improve R shoulder flexion and scaption AROM to 140 degrees or more to promote ability to reach.     Baseline  Supine R shoulder AAROM 113 degrees flexion, 120 degrees scaption (02/28/2019)    Time  8    Period  Weeks    Status  New    Target Date  04/27/19      PT LONG TERM GOAL #2   Title  Patient will improve R shoulder ER and IR AROM to 80 degrees or more to promote ability to reach behind her back as well as behind her head.     Baseline  Supine R shoulder AAROM in scapular plane: 53 degrees ER, 66 degrees IR (02/28/2019)     Time  8    Period  Weeks    Status  New    Target Date  04/27/19      PT LONG TERM GOAL #3   Title  Patient will have at least 4+/5 R shoulder strength (flexion, scaption, ER, IR) to promote ability to raise her R arm, as well as to use it to perform functional tasks.     Baseline  R shoulder strength not yet tested  (02/28/2019)    Time  8    Period  Weeks    Status  New    Target Date  04/27/19            Plan - 03/21/19 1433    Clinical Impression Statement  Implemented AROM exercises today per protocol as patient is currently 9 weeks post op. No discomfort with the AROM exercises but good muscle use felt. Cues provided to maintain proper scapular positioning with arm movements. Pt able to achieve 132 degrees, and 122 degrees standing R shoulder flexion and scaption AROM respectively. Pt making progress with AROM. Pt tolerated session well without aggravation of symptoms. Pt will benefit from continued skilled physical therapy sessions to promote ROM, strength and function.     Personal Factors and Comorbidities  Age;Comorbidity 1    Comorbidities  CVA hx    Examination-Activity Limitations  Bathing;Carry;Dressing;Hygiene/Grooming;Lift;Reach Overhead;Toileting    Stability/Clinical Decision Making  Stable/Uncomplicated   Decreased R shoulder pain after session   Rehab Potential  Good    PT Frequency  2x / week    PT Duration  8 weeks    PT Treatment/Interventions  Therapeutic exercise;Therapeutic activities;Neuromuscular re-education;Patient/family education;Manual techniques;Dry needling;Electrical Stimulation;Iontophoresis 4mg /ml Dexamethasone    PT Next Visit Plan  PROM, AAROM, scapular strengthening, AROM and strengthening when appropriate, follow protocol    Consulted and Agree with Plan of Care  Patient  Patient will benefit from skilled therapeutic intervention in order to improve the following deficits and impairments:  Pain, Postural dysfunction, Impaired UE functional use, Improper body mechanics, Decreased strength, Decreased range of motion  Visit Diagnosis: Acute pain of right shoulder  Muscle weakness (generalized)  Stiffness of right shoulder, not elsewhere classified     Problem List Patient Active Problem List   Diagnosis Date Noted  . Cervical spondylosis with  radiculopathy 10/25/2017  . Sinusitis, chronic 11/18/2016  . Dyspnea and respiratory abnormality 05/21/2016  . Wheeze 05/21/2016  . Chronic throat clearing 05/21/2016  . Pulmonary HTN ([place]) 04/23/2016  . Moderate persistent asthma 11/12/2015  . Allergic rhinoconjunctivitis 11/12/2015  . 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  . SOB (shortness of breath) 03/06/2015  . OSA (obstructive sleep apnea) 01/01/2014  . Benign essential HTN 01/01/2014  . Obesity 01/01/2014    Joneen Boers PT, DPT   03/21/2019, 3:14 PM  [place] [facility] [address], [place], [address] Phone: [phone]   Fax:  [phone]  Name: DEREON WILLIAMSEN MRN: [id number] Date of Birth: 10-11-1946

## 2019-03-21 NOTE — Patient Instructions (Signed)
  MedbridgeAccess Code: [email or web address]  Supine Shoulder Flexion Extension Full Range AROM  2-3x10 daily  Sidelying Shoulder External Rotation AROM  10x3

## 2019-03-23 ENCOUNTER — Ambulatory Visit: Payer: Medicare Other

## 2019-03-23 ENCOUNTER — Other Ambulatory Visit: Payer: Self-pay

## 2019-03-23 DIAGNOSIS — M6281 Muscle weakness (generalized): Secondary | ICD-10-CM

## 2019-03-23 DIAGNOSIS — M25511 Pain in right shoulder: Secondary | ICD-10-CM | POA: Diagnosis not present

## 2019-03-23 DIAGNOSIS — M25611 Stiffness of right shoulder, not elsewhere classified: Secondary | ICD-10-CM

## 2019-03-23 NOTE — Therapy (Signed)
[facility] [address] [place], [address] Phone: [phone]   Fax:  [phone]  Physical Therapy Treatment  Patient Details  Name: Amy Vang MRN: [id number] Date of Birth: Mar 03, 1946 Referring Provider (PT): Justice Britain, MD   Encounter Date: 03/23/2019  PT End of Session - 03/23/19 1304    Visit Number  7    Number of Visits  17    Date for PT Re-Evaluation  04/27/19    Authorization Type  7    Authorization Time Period  of 10 medicare (02/28/2019 eval)    PT Start Time  1304    PT Stop Time  1353    PT Time Calculation (min)  49 min    Activity Tolerance  Patient tolerated treatment well    Behavior During Therapy  [facility] for tasks assessed/performed       Past Medical History:  Diagnosis Date  . Acid reflux   . Actinic keratosis    Precancerous  . Allergic rhinitis   . Anxiety   . Asthma   . Breast mass   . CVA (cerebral infarction)   . Cystocele   . Degenerative disc disease, cervical   . Diastolic dysfunction   . GERD (gastroesophageal reflux disease) PT STATES SEVERE---- CONTROLLED W/ NEXIUM  . History of hiatal hernia   . History of TIAs AGE 73  AND 2008---  NO RESIDUAL  . Hypertension   . Non-specific gastrointestinal complaint   . OSA (obstructive sleep apnea)    Severe CPAP 12 MM H2O- Dr Radford Pax  . Pneumonia    hx  . PONV (postoperative nausea and vomiting)   . Pulmonary HTN ([place])    Moderate with PASP 35mmHg by echo 03/2016  . Restless leg syndrome   . Rosacea   . Stroke [facility])    tia's x3 last 4 yrs ago  . SUI (stress urinary incontinence, female)     Past Surgical History:  Procedure Laterality Date  . ANTERIOR CERVICAL DECOMP/DISCECTOMY FUSION N/A 10/25/2017   Procedure: CERVICAL SIX- CERVICAL SEVEN ANTERIOR CERVICAL DECOMPRESSION/DISCECTOMY FUSION, INTERBODY PROSTHESIS AND ANTERIOR PLATING;  Surgeon: Newman Pies, MD;  Location: [place];  Service:  Neurosurgery;  Laterality: N/A;  CERVICAL 6- CERVICAL 7 ANTERIOR CERVICAL DECOMPRESSION/DISCECTOMY FUSION, INTERBODY PROSTHESIS AND ANTERIOR PLATING  . BILATERAL BENIGN BREAST BX'S  20 YRS AGO  . BREAST BIOPSY Right    benign  . BREAST EXCISIONAL BIOPSY Bilateral over 10 years ago   benign  . CARDIAC CATHETERIZATION N/A 08/13/2016   Procedure: Right Heart Cath;  Surgeon: Larey Dresser, MD;  Location: [place] CV LAB;  Service: Cardiovascular;  Laterality: N/A;  . CARPAL TUNNEL RELEASE  2009   BILATERAL  . CYSTOCELE REPAIR  12/11/2011   Procedure: ANTERIOR REPAIR (CYSTOCELE);  Surgeon: Claybon Jabs, MD;  Location: [facility];  Service: Urology;  Laterality: N/A;  1 1/2 hour requested for case  Anterior repair and mid Urethral Sling  . NASAL SEPTOPLASTY W/ TURBINOPLASTY Bilateral 11/18/2016   Procedure: NASAL SEPTOPLASTY WITH BILATERAL TURBINATE REDUCTION;  Surgeon: Jerrell Belfast, MD;  Location: [place];  Service: ENT;  Laterality: Bilateral;  . NONINVASIVE VASCULAR CAROTID STUDY  09-14-2007   BILATERAL MILD MIX PLAQUE THROUGHOUT, NO SIGNIFICANT BILATERAL ICA STENOSIS  . PUBOVAGINAL SLING  12/11/2011   Procedure: Gaynelle Arabian;  Surgeon: Claybon Jabs, MD;  Location: [facility];  Service: Urology;  Laterality: N/A;  . PULLEY RELEASE  RIGHT THUMB  2009  . SINUS ENDO WITH FUSION Bilateral 11/18/2016   Procedure: BILATERAL ENDOSCOPIC SINUS SURGERY;  Surgeon: Jerrell Belfast, MD;  Location: [place];  Service: ENT;  Laterality: Bilateral;  . VAGINAL HYSTERECTOMY  AGE 73    There were no vitals filed for this visit.  Subjective Assessment - 03/23/19 1306    Subjective  Pt accidentally jerked her R hand to turn her wheel to the R when driving. Happend quickly. R proximal arm muscles feel tight.     Pertinent History  S/P R rotator cuff repair on 01/17/2019. Her next MD appointment is this Friday. Did not wear her sling today because she did not want it  exposed to COVID-19. Did not wear her sling much yesterday and it woke her up in the middle of the night.  Has been doing her pendulums at home.  Has not had PT since her surgery.  Pt is L hand dominant.  Pt states that the surgery went well.     Patient Stated Goals  Be able to use her R arm and be functional like before (be able to raise her arm up and pick up her 88 year old grandson)    Currently in Pain?  Yes    Pain Score  1    R proximal arm.    Pain Onset  More than a month ago                               PT Education - 03/23/19 1830    Education Details  ther-ex, HEP    Person(s) Educated  Patient    Methods  Explanation;Demonstration;Tactile cues;Verbal cues;Handout    Comprehension  Returned demonstration;Verbalized understanding        Objective   S/P72weeks  No latex band allergies  MedbridgeAccess Code: [email or web address]   Manual therapy  Supine STM R teres major muscle Supine STM R pectoralis major distal fibers    Therapeutic exercise  Supine R shoulder AAROM with PT    Flexion 10x2  scaption 10x2  Abduction 10x2    In scapular plane     ER 10x2    IR 10x2    No R proximal arm tightness afterwards.  Supine R shoulder AROM  Flexion to end range10x2   151 degrees supine flexion AROM  S/L R shoulder ER AROM 10x2.   R anterior arm discomfort which eases with rest.   Standing R shoulder AAROM  Ball rolls up the wall in a pain free range of motion   flexion 5x   scaption 5x  Standing R shoulder AROM in front of the mirror, cues for scapular control   Flexion 5x  Reviewed and given as part of her HEP. Pt demonstrated and verbalized understanding. Handout provided.   Pt states no R shoulder/arm pain after session    Improved exercise technique, movement at target joints, use of target muscles after min to mod verbal, visual, tactile cues.  Response to treatment Good stretch as well as muscle use felt with  exercises, no pain.Pt tolerated session well without aggravation of symptoms.     Clinical impression Started with supine AAROM shoulder exercises and manual therapy to help decrease tightness, followed by supine AROM and standing AAROM to promote ability to raise her R arm up against gravity. Pt reports of no R shoulder/proximal arm pain or tightness at rest at end of session. Still needed cues for scapular  control when raising her arm up against gravity. Pt tolerated session well without aggravation of symptoms. Pt will benefit from continued skilled physical therapy services to improve ROM, glenohumeral control, strength and function.       PT Short Term Goals - 02/28/19 1548      PT SHORT TERM GOAL #1   Title  Patient will be independent with her HEP to promote ROM, strength, and function.     Baseline  Pt has started her HEP (02/28/2019)    Time  3    Period  Weeks    Status  New    Target Date  03/23/19        PT Long Term Goals - 02/28/19 1549      PT LONG TERM GOAL #1   Title  Patient will improve R shoulder flexion and scaption AROM to 140 degrees or more to promote ability to reach.     Baseline  Supine R shoulder AAROM 113 degrees flexion, 120 degrees scaption (02/28/2019)    Time  8    Period  Weeks    Status  New    Target Date  04/27/19      PT LONG TERM GOAL #2   Title  Patient will improve R shoulder ER and IR AROM to 80 degrees or more to promote ability to reach behind her back as well as behind her head.     Baseline  Supine R shoulder AAROM in scapular plane: 53 degrees ER, 66 degrees IR (02/28/2019)     Time  8    Period  Weeks    Status  New    Target Date  04/27/19      PT LONG TERM GOAL #3   Title  Patient will have at least 4+/5 R shoulder strength (flexion, scaption, ER, IR) to promote ability to raise her R arm, as well as to use it to perform functional tasks.     Baseline  R shoulder strength not yet tested (02/28/2019)    Time  8    Period   Weeks    Status  New    Target Date  04/27/19            Plan - 03/23/19 1257    Clinical Impression Statement  Started with supine AAROM shoulder exercises and manual therapy to help decrease tightness, followed by supine AROM and standing AAROM to promote ability to raise her R arm up against gravity. Pt reports of no R shoulder/proximal arm pain or tightness at rest at end of session. Still needed cues for scapular control when raising her arm up against gravity. Pt tolerated session well without aggravation of symptoms. Pt will benefit from continued skilled physical therapy services to improve ROM, glenohumeral control, strength and function.     Personal Factors and Comorbidities  Age;Comorbidity 1    Comorbidities  CVA hx    Examination-Activity Limitations  Bathing;Carry;Dressing;Hygiene/Grooming;Lift;Reach Overhead;Toileting    Stability/Clinical Decision Making  Stable/Uncomplicated   Decreased R shoulder pain after session   Rehab Potential  Good    PT Frequency  2x / week    PT Duration  8 weeks    PT Treatment/Interventions  Therapeutic exercise;Therapeutic activities;Neuromuscular re-education;Patient/family education;Manual techniques;Dry needling;Electrical Stimulation;Iontophoresis 4mg /ml Dexamethasone    PT Next Visit Plan  PROM, AAROM, scapular strengthening, AROM and strengthening when appropriate, follow protocol    Consulted and Agree with Plan of Care  Patient       Patient will benefit from  skilled therapeutic intervention in order to improve the following deficits and impairments:  Pain, Postural dysfunction, Impaired UE functional use, Improper body mechanics, Decreased strength, Decreased range of motion  Visit Diagnosis: Acute pain of right shoulder  Muscle weakness (generalized)  Stiffness of right shoulder, not elsewhere classified     Problem List Patient Active Problem List   Diagnosis Date Noted  . Cervical spondylosis with radiculopathy  10/25/2017  . Sinusitis, chronic 11/18/2016  . Dyspnea and respiratory abnormality 05/21/2016  . Wheeze 05/21/2016  . Chronic throat clearing 05/21/2016  . Pulmonary HTN ([place]) 04/23/2016  . Moderate persistent asthma 11/12/2015  . Allergic rhinoconjunctivitis 11/12/2015  . 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  . SOB (shortness of breath) 03/06/2015  . OSA (obstructive sleep apnea) 01/01/2014  . Benign essential HTN 01/01/2014  . Obesity 01/01/2014    Joneen Boers PT, DPT   03/23/2019, 6:37 PM  [facility] [address] [place], [address] Phone: [phone]   Fax:  [phone]  Name: TAJANA CROTTEAU MRN: [id number] Date of Birth: January 17, 1946

## 2019-03-23 NOTE — Patient Instructions (Signed)
  Stand in front of a mirror to monitor form   Raise your right arm up forward, keeping your shoulder blade back and down in a comfortable range   5 repetitions, for 3 times a day.     Raise your right arm up diagonally, keeping your shoulder blade back and down in a comfortable range    5 repetitions, for 3 times a day

## 2019-03-28 ENCOUNTER — Other Ambulatory Visit: Payer: Self-pay

## 2019-03-28 ENCOUNTER — Ambulatory Visit: Payer: Medicare Other

## 2019-03-28 DIAGNOSIS — M6281 Muscle weakness (generalized): Secondary | ICD-10-CM

## 2019-03-28 DIAGNOSIS — M25511 Pain in right shoulder: Secondary | ICD-10-CM

## 2019-03-28 DIAGNOSIS — M25611 Stiffness of right shoulder, not elsewhere classified: Secondary | ICD-10-CM

## 2019-03-28 NOTE — Therapy (Signed)
[facility] [address] [place], [address] Phone: [phone]   Fax:  [phone]  Physical Therapy Treatment  Patient Details  Name: Amy Vang MRN: [id number] Date of Birth: 1946/04/28 Referring Provider (PT): Justice Britain, MD   Encounter Date: 03/28/2019  PT End of Session - 03/28/19 1404    Visit Number  8    Number of Visits  17    Date for PT Re-Evaluation  04/27/19    Authorization Type  8    Authorization Time Period  of 10 medicare (02/28/2019 eval)    PT Start Time  1404    PT Stop Time  1445    PT Time Calculation (min)  41 min    Activity Tolerance  Patient tolerated treatment well    Behavior During Therapy  [facility] for tasks assessed/performed       Past Medical History:  Diagnosis Date  . Acid reflux   . Actinic keratosis    Precancerous  . Allergic rhinitis   . Anxiety   . Asthma   . Breast mass   . CVA (cerebral infarction)   . Cystocele   . Degenerative disc disease, cervical   . Diastolic dysfunction   . GERD (gastroesophageal reflux disease) PT STATES SEVERE---- CONTROLLED W/ NEXIUM  . History of hiatal hernia   . History of TIAs AGE 89  AND 2008---  NO RESIDUAL  . Hypertension   . Non-specific gastrointestinal complaint   . OSA (obstructive sleep apnea)    Severe CPAP 12 MM H2O- Dr Radford Pax  . Pneumonia    hx  . PONV (postoperative nausea and vomiting)   . Pulmonary HTN ([place])    Moderate with PASP 34mmHg by echo 03/2016  . Restless leg syndrome   . Rosacea   . Stroke [facility])    tia's x3 last 4 yrs ago  . SUI (stress urinary incontinence, female)     Past Surgical History:  Procedure Laterality Date  . ANTERIOR CERVICAL DECOMP/DISCECTOMY FUSION N/A 10/25/2017   Procedure: CERVICAL SIX- CERVICAL SEVEN ANTERIOR CERVICAL DECOMPRESSION/DISCECTOMY FUSION, INTERBODY PROSTHESIS AND ANTERIOR PLATING;  Surgeon: Newman Pies, MD;  Location: [place];  Service:  Neurosurgery;  Laterality: N/A;  CERVICAL 6- CERVICAL 7 ANTERIOR CERVICAL DECOMPRESSION/DISCECTOMY FUSION, INTERBODY PROSTHESIS AND ANTERIOR PLATING  . BILATERAL BENIGN BREAST BX'S  20 YRS AGO  . BREAST BIOPSY Right    benign  . BREAST EXCISIONAL BIOPSY Bilateral over 10 years ago   benign  . CARDIAC CATHETERIZATION N/A 08/13/2016   Procedure: Right Heart Cath;  Surgeon: Larey Dresser, MD;  Location: [place] CV [facility];  Service: Cardiovascular;  Laterality: N/A;  . CARPAL TUNNEL RELEASE  2009   BILATERAL  . CYSTOCELE REPAIR  12/11/2011   Procedure: ANTERIOR REPAIR (CYSTOCELE);  Surgeon: Claybon Jabs, MD;  Location: [facility];  Service: Urology;  Laterality: N/A;  1 1/2 hour requested for case  Anterior repair and mid Urethral Sling  . NASAL SEPTOPLASTY W/ TURBINOPLASTY Bilateral 11/18/2016   Procedure: NASAL SEPTOPLASTY WITH BILATERAL TURBINATE REDUCTION;  Surgeon: Jerrell Belfast, MD;  Location: [place];  Service: ENT;  Laterality: Bilateral;  . NONINVASIVE VASCULAR CAROTID STUDY  09-14-2007   BILATERAL MILD MIX PLAQUE THROUGHOUT, NO SIGNIFICANT BILATERAL ICA STENOSIS  . PUBOVAGINAL SLING  12/11/2011   Procedure: Gaynelle Arabian;  Surgeon: Claybon Jabs, MD;  Location: [facility];  Service: Urology;  Laterality: N/A;  . PULLEY RELEASE  RIGHT THUMB  2009  . SINUS ENDO WITH FUSION Bilateral 11/18/2016   Procedure: BILATERAL ENDOSCOPIC SINUS SURGERY;  Surgeon: Jerrell Belfast, MD;  Location: [place];  Service: ENT;  Laterality: Bilateral;  . VAGINAL HYSTERECTOMY  AGE 68    There were no vitals filed for this visit.  Subjective Assessment - 03/28/19 1405    Subjective  Pt states states having to pay $240 after today's visit. Has a $30 copay each visit and today is her 8th visit.   Does not know how many more visits she can do.  R shoulder feels good, no pain or discomfort.  Feels good about raising her arm.     Pertinent History  S/P R rotator cuff repair  on 01/17/2019. Her next MD appointment is this Friday. Did not wear her sling today because she did not want it exposed to COVID-19. Did not wear her sling much yesterday and it woke her up in the middle of the night.  Has been doing her pendulums at home.  Has not had PT since her surgery.  Pt is L hand dominant.  Pt states that the surgery went well.     Patient Stated Goals  Be able to use her R arm and be functional like before (be able to raise her arm up and pick up her 30 year old grandson)    Currently in Pain?  No/denies    Pain Score  0-No pain    Pain Onset  More than a month ago                               PT Education - 03/28/19 1435    Education Details  ther-ex, HEP    Person(s) Educated  Patient    Methods  Explanation;Demonstration;Tactile cues    Comprehension  Returned demonstration;Verbalized understanding      Objective   S/P10weeks  No latex band allergies  Has another visit with her surgeon 04/03/2019   MedbridgeAccess Code: [email or web address]   Manual therapy  Seated STM R infraspinatus  Functional R shoulder IR improved to R thumb at T12    Therapeutic exercise  Standing R shoulder AROM 1x each way   Flexion 141 degrees (151 degrees L shoulder)  Scaption 140 degrees (152 degrees L shoulder)   Abduction 153 degrees (153 degrees L shoulder)  Standing R shoulder functional IR  (L shoulder: index finger to R shoulder blade)   R shoulder: R thumb to L3   R shoulder isometrics submax (50 % effort)  ER 10x5 seconds for 2 sets  IR 10x5 seconds for 2 sets  Flexion 10x5 seconds for 2 sets  Abduction 10x5 seconds for 2 sets    Reviewed and given as part of her HEP. Pt demonstrated and verbalized understanding. Handout provided via medbridge.   Supine rhythmic stabilization with gentle manual resistance from PT, R shoulder at 90 degrees flexion 10x5 seconds for 2 sets   Reviewed other HEP.   Reviewed plan of care: skip  next visit with pt performing her HEP and return next week secondary to insurance related reasons and pt doing well and demonstrates independence with her HEP.    Improved exercise technique, movement at target joints, use of target muscles after min to mod verbal, visual, tactile cues.      Response to treatment  Improving AROM.Pt tolerated session well without aggravation of symptoms.    Clinical impression  Added isometric  shoulder strengthening today per surgeon protocol to initiate progressive resistive exercises at 10 weeks post op. Started patient at 50% effort. No pain reported. Pt also demonstrates improving R shoulder AROM, close to L shoulder AROM range. Pt making progress with PT towards goals. Pt will benefit from continued skilled physical therapy services to improve ROM, strength and function.    PT Short Term Goals - 02/28/19 1548      PT SHORT TERM GOAL #1   Title  Patient will be independent with her HEP to promote ROM, strength, and function.     Baseline  Pt has started her HEP (02/28/2019)    Time  3    Period  Weeks    Status  New    Target Date  03/23/19        PT Long Term Goals - 02/28/19 1549      PT LONG TERM GOAL #1   Title  Patient will improve R shoulder flexion and scaption AROM to 140 degrees or more to promote ability to reach.     Baseline  Supine R shoulder AAROM 113 degrees flexion, 120 degrees scaption (02/28/2019)    Time  8    Period  Weeks    Status  New    Target Date  04/27/19      PT LONG TERM GOAL #2   Title  Patient will improve R shoulder ER and IR AROM to 80 degrees or more to promote ability to reach behind her back as well as behind her head.     Baseline  Supine R shoulder AAROM in scapular plane: 53 degrees ER, 66 degrees IR (02/28/2019)     Time  8    Period  Weeks    Status  New    Target Date  04/27/19      PT LONG TERM GOAL #3   Title  Patient will have at least 4+/5 R shoulder strength (flexion, scaption,  ER, IR) to promote ability to raise her R arm, as well as to use it to perform functional tasks.     Baseline  R shoulder strength not yet tested (02/28/2019)    Time  8    Period  Weeks    Status  New    Target Date  04/27/19            Plan - 03/28/19 1403    Clinical Impression Statement  Added isometric shoulder strengthening today per surgeon protocol to initiate progressive resistive exercises at 10 weeks post op. Started patient at 50% effort. No pain reported. Pt also demonstrates improving R shoulder AROM, close to L shoulder AROM range. Pt making progress with PT towards goals. Pt will benefit from continued skilled physical therapy services to improve ROM, strength and function.     Personal Factors and Comorbidities  Age;Comorbidity 1    Comorbidities  CVA hx    Examination-Activity Limitations  Bathing;Carry;Dressing;Hygiene/Grooming;Lift;Reach Overhead;Toileting    Stability/Clinical Decision Making  Stable/Uncomplicated   Decreased R shoulder pain after session   Rehab Potential  Good    PT Frequency  2x / week    PT Duration  8 weeks    PT Treatment/Interventions  Therapeutic exercise;Therapeutic activities;Neuromuscular re-education;Patient/family education;Manual techniques;Dry needling;Electrical Stimulation;Iontophoresis 4mg /ml Dexamethasone    PT Next Visit Plan  PROM, AAROM, scapular strengthening, AROM and strengthening when appropriate, follow protocol    Consulted and Agree with Plan of Care  Patient       Patient will benefit from skilled  therapeutic intervention in order to improve the following deficits and impairments:  Pain, Postural dysfunction, Impaired UE functional use, Improper body mechanics, Decreased strength, Decreased range of motion  Visit Diagnosis: Muscle weakness (generalized)  Stiffness of right shoulder, not elsewhere classified  Acute pain of right shoulder     Problem List Patient Active Problem List   Diagnosis Date Noted  .  Cervical spondylosis with radiculopathy 10/25/2017  . Sinusitis, chronic 11/18/2016  . Dyspnea and respiratory abnormality 05/21/2016  . Wheeze 05/21/2016  . Chronic throat clearing 05/21/2016  . Pulmonary HTN ([place]) 04/23/2016  . Moderate persistent asthma 11/12/2015  . Allergic rhinoconjunctivitis 11/12/2015  . 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  . SOB (shortness of breath) 03/06/2015  . OSA (obstructive sleep apnea) 01/01/2014  . Benign essential HTN 01/01/2014  . Obesity 01/01/2014    Joneen Boers PT, DPT   03/28/2019, 3:06 PM  [place] [address] [place], [address] Phone: [phone]   Fax:  [phone]  Name: Amy Vang MRN: [id number] Date of Birth: 10-Sep-1946

## 2019-03-28 NOTE — Patient Instructions (Addendum)
  MedbridgeAccess Code: [email or web address]  R shoulder isometrics submax (50 % effort)  ER 10x5 seconds for 3 sets  IR 10x5 seconds for 3 sets  Flexion 10x5 seconds for 3 sets  Abduction 10x5 seconds for 3 sets    Reviewed and given as part of her HEP. Pt demonstrated and verbalized understanding. Handout provided via medbridge.

## 2019-04-04 ENCOUNTER — Ambulatory Visit: Payer: Medicare Other | Attending: Orthopedic Surgery

## 2019-04-04 ENCOUNTER — Other Ambulatory Visit: Payer: Self-pay

## 2019-04-04 DIAGNOSIS — M25511 Pain in right shoulder: Secondary | ICD-10-CM | POA: Diagnosis present

## 2019-04-04 DIAGNOSIS — M6281 Muscle weakness (generalized): Secondary | ICD-10-CM | POA: Diagnosis present

## 2019-04-04 DIAGNOSIS — M25611 Stiffness of right shoulder, not elsewhere classified: Secondary | ICD-10-CM | POA: Diagnosis present

## 2019-04-04 NOTE — Therapy (Signed)
[place] [facility] [address] [place], [address] Phone: [phone]   Fax:  [phone]  Physical Therapy Treatment  Patient Details  Name: Amy Vang MRN: [id number] Date of Birth: 12/31/1945 Referring Provider (PT): Justice Britain, MD   Encounter Date: 04/04/2019  PT End of Session - 04/04/19 1421    Visit Number  9    Number of Visits  17    Date for PT Re-Evaluation  04/27/19    Authorization Type  9    Authorization Time Period  of 10 medicare (02/28/2019 eval)    PT Start Time  1405    PT Stop Time  1457    PT Time Calculation (min)  52 min    Activity Tolerance  Patient tolerated treatment well    Behavior During Therapy  [facility] for tasks assessed/performed       Past Medical History:  Diagnosis Date  . Acid reflux   . Actinic keratosis    Precancerous  . Allergic rhinitis   . Anxiety   . Asthma   . Breast mass   . CVA (cerebral infarction)   . Cystocele   . Degenerative disc disease, cervical   . Diastolic dysfunction   . GERD (gastroesophageal reflux disease) PT STATES SEVERE---- CONTROLLED W/ NEXIUM  . History of hiatal hernia   . History of TIAs AGE 73  AND 2008---  NO RESIDUAL  . Hypertension   . Non-specific gastrointestinal complaint   . OSA (obstructive sleep apnea)    Severe CPAP 12 MM H2O- Dr Radford Pax  . Pneumonia    hx  . PONV (postoperative nausea and vomiting)   . Pulmonary HTN ([place])    Moderate with PASP 70mmHg by echo 03/2016  . Restless leg syndrome   . Rosacea   . Stroke [facility])    tia's x3 last 4 yrs ago  . SUI (stress urinary incontinence, female)     Past Surgical History:  Procedure Laterality Date  . ANTERIOR CERVICAL DECOMP/DISCECTOMY FUSION N/A 10/25/2017   Procedure: CERVICAL SIX- CERVICAL SEVEN ANTERIOR CERVICAL DECOMPRESSION/DISCECTOMY FUSION, INTERBODY PROSTHESIS AND ANTERIOR PLATING;  Surgeon: Newman Pies, MD;  Location: [place];  Service: Neurosurgery;   Laterality: N/A;  CERVICAL 6- CERVICAL 7 ANTERIOR CERVICAL DECOMPRESSION/DISCECTOMY FUSION, INTERBODY PROSTHESIS AND ANTERIOR PLATING  . BILATERAL BENIGN BREAST BX'S  20 YRS AGO  . BREAST BIOPSY Right    benign  . BREAST EXCISIONAL BIOPSY Bilateral over 10 years ago   benign  . CARDIAC CATHETERIZATION N/A 08/13/2016   Procedure: Right Heart Cath;  Surgeon: Larey Dresser, MD;  Location: [place] [facility];  Service: Cardiovascular;  Laterality: N/A;  . CARPAL TUNNEL RELEASE  2009   BILATERAL  . CYSTOCELE REPAIR  12/11/2011   Procedure: ANTERIOR REPAIR (CYSTOCELE);  Surgeon: Claybon Jabs, MD;  Location: [facility];  Service: Urology;  Laterality: N/A;  1 1/2 hour requested for case  Anterior repair and mid Urethral Sling  . NASAL SEPTOPLASTY W/ TURBINOPLASTY Bilateral 11/18/2016   Procedure: NASAL SEPTOPLASTY WITH BILATERAL TURBINATE REDUCTION;  Surgeon: Jerrell Belfast, MD;  Location: [place];  Service: ENT;  Laterality: Bilateral;  . NONINVASIVE VASCULAR CAROTID STUDY  09-14-2007   BILATERAL MILD MIX PLAQUE THROUGHOUT, NO SIGNIFICANT BILATERAL ICA STENOSIS  . PUBOVAGINAL SLING  12/11/2011   Procedure: Gaynelle Arabian;  Surgeon: Claybon Jabs, MD;  Location: [facility];  Service: Urology;  Laterality: N/A;  . PULLEY RELEASE  RIGHT THUMB  2009  . SINUS ENDO WITH FUSION Bilateral 11/18/2016   Procedure: BILATERAL ENDOSCOPIC SINUS SURGERY;  Surgeon: Jerrell Belfast, MD;  Location: [place];  Service: ENT;  Laterality: Bilateral;  . VAGINAL HYSTERECTOMY  AGE 73    There were no vitals filed for this visit.  Subjective Assessment - 04/04/19 1405    Subjective  R shoulder feels fine. Had a phone conversation with Dr. Onnie Graham and he said that she can do PT for 1x a week. Can push herself a little bit more at home. Has about 4 more weeks left     Pertinent History  S/P R rotator cuff repair on 01/17/2019. Her next MD appointment is this Friday. Did not wear her  sling today because she did not want it exposed to COVID-19. Did not wear her sling much yesterday and it woke her up in the middle of the night.  Has been doing her pendulums at home.  Has not had PT since her surgery.  Pt is L hand dominant.  Pt states that the surgery went well.     Patient Stated Goals  Be able to use her R arm and be functional like before (be able to raise her arm up and pick up her 72 year old grandson)    Currently in Pain?  No/denies    Pain Score  0-No pain    Pain Onset  More than a month ago                               PT Education - 04/04/19 1421    Education Details  ther-ex, HEP    Person(s) Educated  Patient    Methods  Explanation;Demonstration;Tactile cues;Verbal cues;Handout    Comprehension  Returned demonstration;Verbalized understanding      Objective   S/P11weeks  No latex band allergies     MedbridgeAccess Code: [email or web address]   Manual therapy Supine STM R teres major muscle   Seated STM R infraspinatus             Functional R shoulder IR improved to R thumb at T11    Therapeutic exercise   Supine R shoulder flexion AROM 10x5 seconds after manual therapy  Supine rhythmic stabilization with gentle manual resistance from PT, R shoulder at 90 degrees flexion 10x5 seconds for 2 sets   Standing R shoulder functional IR  Thumb to T12  Standing R shoulder IR red band 10x2  Standing R shoulder ER yellow band 10x2   Standing scapular retraction   Red band 10x5 seconds. Easy  Green band 10x5 seconds for 2 sets  Reviewed HEP. Pt demonstrated and verbalized understanding. Handout provided via text message Medbridge.     Standing R shoulder AROM 1x each way              Flexion 142 degrees              Scaption 153 degrees              Abduction 151 degrees    Improved exercise technique, movement at target joints, use of target muscles after min to mod verbal, visual, tactile cues.      Response to treatment  Improving AROM.Pt tolerated session well without aggravation of symptoms.    Clinical impression Pt demonstrates good carry over of improved R shoulder AROM from previous session. Added ER and IR rotator cuff strengthening as well as resisted  scapular retraction to promote ability to raise her arm up with improved glenohumeral control. Pt tolerated session well without aggravation of symptoms. Pt will benefit from continued skilled physical therapy services to improve AROM, strength, and function.      PT Short Term Goals - 02/28/19 1548      PT SHORT TERM GOAL #1   Title  Patient will be independent with her HEP to promote ROM, strength, and function.     Baseline  Pt has started her HEP (02/28/2019)    Time  3    Period  Weeks    Status  New    Target Date  03/23/19        PT Long Term Goals - 02/28/19 1549      PT LONG TERM GOAL #1   Title  Patient will improve R shoulder flexion and scaption AROM to 140 degrees or more to promote ability to reach.     Baseline  Supine R shoulder AAROM 113 degrees flexion, 120 degrees scaption (02/28/2019)    Time  8    Period  Weeks    Status  New    Target Date  04/27/19      PT LONG TERM GOAL #2   Title  Patient will improve R shoulder ER and IR AROM to 80 degrees or more to promote ability to reach behind her back as well as behind her head.     Baseline  Supine R shoulder AAROM in scapular plane: 53 degrees ER, 66 degrees IR (02/28/2019)     Time  8    Period  Weeks    Status  New    Target Date  04/27/19      PT LONG TERM GOAL #3   Title  Patient will have at least 4+/5 R shoulder strength (flexion, scaption, ER, IR) to promote ability to raise her R arm, as well as to use it to perform functional tasks.     Baseline  R shoulder strength not yet tested (02/28/2019)    Time  8    Period  Weeks    Status  New    Target Date  04/27/19            Plan - 04/04/19 1403    Clinical  Impression Statement  Pt demonstrates good carry over of improved R shoulder AROM from previous session. Added ER and IR rotator cuff strengthening as well as resisted scapular retraction to promote ability to raise her arm up with improved glenohumeral control. Pt tolerated session well without aggravation of symptoms. Pt will benefit from continued skilled physical therapy services to improve AROM, strength, and function.     Personal Factors and Comorbidities  Age;Comorbidity 1    Comorbidities  CVA hx    Examination-Activity Limitations  Bathing;Carry;Dressing;Hygiene/Grooming;Lift;Reach Overhead;Toileting    Stability/Clinical Decision Making  Stable/Uncomplicated   Decreased R shoulder pain after session   Rehab Potential  Good    PT Frequency  2x / week    PT Duration  8 weeks    PT Treatment/Interventions  Therapeutic exercise;Therapeutic activities;Neuromuscular re-education;Patient/family education;Manual techniques;Dry needling;Electrical Stimulation;Iontophoresis 4mg /ml Dexamethasone    PT Next Visit Plan  PROM, AAROM, scapular strengthening, AROM and strengthening when appropriate, follow protocol    Consulted and Agree with Plan of Care  Patient       Patient will benefit from skilled therapeutic intervention in order to improve the following deficits and impairments:  Pain, Postural dysfunction, Impaired UE functional use, Improper  body mechanics, Decreased strength, Decreased range of motion  Visit Diagnosis: Muscle weakness (generalized)  Stiffness of right shoulder, not elsewhere classified     Problem List Patient Active Problem List   Diagnosis Date Noted  . Cervical spondylosis with radiculopathy 10/25/2017  . Sinusitis, chronic 11/18/2016  . Dyspnea and respiratory abnormality 05/21/2016  . Wheeze 05/21/2016  . Chronic throat clearing 05/21/2016  . Pulmonary HTN ([place]) 04/23/2016  . Moderate persistent asthma 11/12/2015  . Allergic rhinoconjunctivitis  11/12/2015  . 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  . SOB (shortness of breath) 03/06/2015  . OSA (obstructive sleep apnea) 01/01/2014  . Benign essential HTN 01/01/2014  . Obesity 01/01/2014    Joneen Boers PT, DPT   04/04/2019, 3:08 PM  [place] [address] [place], [address] Phone: [phone]   Fax:  [phone]  Name: ANYA MURPHEY MRN: [id number] Date of Birth: December 19, 1945

## 2019-04-04 NOTE — Patient Instructions (Addendum)
MedbridgeAccess Code: [email or web address]   Shoulder Internal Rotation with Resistance   2x10 red band   Shoulder External Rotation with Anchored Resistance  Yellow 10x2  Scapular Retraction with Resistance  Green band 10x3 with 5 second holds    D/C'ed shoulder ER and IR isometrics secondary to performing band resistance.

## 2019-04-06 ENCOUNTER — Ambulatory Visit: Payer: Medicare Other

## 2019-04-07 ENCOUNTER — Other Ambulatory Visit: Payer: Self-pay | Admitting: Family Medicine

## 2019-04-07 DIAGNOSIS — I340 Nonrheumatic mitral (valve) insufficiency: Secondary | ICD-10-CM

## 2019-04-10 ENCOUNTER — Other Ambulatory Visit: Payer: Self-pay

## 2019-04-10 ENCOUNTER — Ambulatory Visit: Payer: Medicare Other | Admitting: Physical Therapy

## 2019-04-10 ENCOUNTER — Encounter: Payer: Self-pay | Admitting: Physical Therapy

## 2019-04-10 DIAGNOSIS — M6281 Muscle weakness (generalized): Secondary | ICD-10-CM | POA: Diagnosis not present

## 2019-04-10 NOTE — Therapy (Signed)
[place] [facility] [address] [place], [address] Phone: [phone]   Fax:  [phone]  Physical Therapy Treatment  Patient Details  Name: Amy Vang MRN: [id number] Date of Birth: 17-Apr-1946 Referring Provider (PT): Justice Britain, MD   Encounter Date: 04/10/2019  PT End of Session - 04/10/19 1445    Visit Number  10    Number of Visits  17    Date for PT Re-Evaluation  04/27/19    Authorization Type  10    Authorization Time Period  of 10 medicare (02/28/2019 eval)    PT Start Time  0100    PT Stop Time  0145    PT Time Calculation (min)  45 min    Activity Tolerance  Patient tolerated treatment well    Behavior During Therapy  [facility] for tasks assessed/performed       Past Medical History:  Diagnosis Date  . Acid reflux   . Actinic keratosis    Precancerous  . Allergic rhinitis   . Anxiety   . Asthma   . Breast mass   . CVA (cerebral infarction)   . Cystocele   . Degenerative disc disease, cervical   . Diastolic dysfunction   . GERD (gastroesophageal reflux disease) PT STATES SEVERE---- CONTROLLED W/ NEXIUM  . History of hiatal hernia   . History of TIAs AGE 73  AND 2008---  NO RESIDUAL  . Hypertension   . Non-specific gastrointestinal complaint   . OSA (obstructive sleep apnea)    Severe CPAP 12 MM H2O- Dr Radford Pax  . Pneumonia    hx  . PONV (postoperative nausea and vomiting)   . Pulmonary HTN ([place])    Moderate with PASP 53mmHg by echo 03/2016  . Restless leg syndrome   . Rosacea   . Stroke [facility])    tia's x3 last 4 yrs ago  . SUI (stress urinary incontinence, female)     Past Surgical History:  Procedure Laterality Date  . ANTERIOR CERVICAL DECOMP/DISCECTOMY FUSION N/A 10/25/2017   Procedure: CERVICAL SIX- CERVICAL SEVEN ANTERIOR CERVICAL DECOMPRESSION/DISCECTOMY FUSION, INTERBODY PROSTHESIS AND ANTERIOR PLATING;  Surgeon: Newman Pies, MD;  Location: [place];  Service:  Neurosurgery;  Laterality: N/A;  CERVICAL 6- CERVICAL 7 ANTERIOR CERVICAL DECOMPRESSION/DISCECTOMY FUSION, INTERBODY PROSTHESIS AND ANTERIOR PLATING  . BILATERAL BENIGN BREAST BX'S  20 YRS AGO  . BREAST BIOPSY Right    benign  . BREAST EXCISIONAL BIOPSY Bilateral over 10 years ago   benign  . CARDIAC CATHETERIZATION N/A 08/13/2016   Procedure: Right Heart Cath;  Surgeon: Larey Dresser, MD;  Location: [place] [facility];  Service: Cardiovascular;  Laterality: N/A;  . CARPAL TUNNEL RELEASE  2009   BILATERAL  . CYSTOCELE REPAIR  12/11/2011   Procedure: ANTERIOR REPAIR (CYSTOCELE);  Surgeon: Claybon Jabs, MD;  Location: [facility];  Service: Urology;  Laterality: N/A;  1 1/2 hour requested for case  Anterior repair and mid Urethral Sling  . NASAL SEPTOPLASTY W/ TURBINOPLASTY Bilateral 11/18/2016   Procedure: NASAL SEPTOPLASTY WITH BILATERAL TURBINATE REDUCTION;  Surgeon: Jerrell Belfast, MD;  Location: [place];  Service: ENT;  Laterality: Bilateral;  . NONINVASIVE VASCULAR CAROTID STUDY  09-14-2007   BILATERAL MILD MIX PLAQUE THROUGHOUT, NO SIGNIFICANT BILATERAL ICA STENOSIS  . PUBOVAGINAL SLING  12/11/2011   Procedure: Gaynelle Arabian;  Surgeon: Claybon Jabs, MD;  Location: [facility];  Service: Urology;  Laterality: N/A;  . PULLEY RELEASE  RIGHT THUMB  2009  . SINUS ENDO WITH FUSION Bilateral 11/18/2016   Procedure: BILATERAL ENDOSCOPIC SINUS SURGERY;  Surgeon: Jerrell Belfast, MD;  Location: [place];  Service: ENT;  Laterality: Bilateral;  . VAGINAL HYSTERECTOMY  AGE 73    There were no vitals filed for this visit.  Subjective Assessment - 04/10/19 1443    Subjective  Patient reports no pain today, and reports her motion is "coming along well". Patient said she is forgetting that she had surgery most times, which she is happy with.     Pertinent History  S/P R rotator cuff repair on 01/17/2019. Her next MD appointment is this Friday. Did not wear her sling  today because she did not want it exposed to COVID-19. Did not wear her sling much yesterday and it woke her up in the middle of the night.  Has been doing her pendulums at home.  Has not had PT since her surgery.  Pt is L hand dominant.  Pt states that the surgery went well.     Patient Stated Goals  Be able to use her R arm and be functional like before (be able to raise her arm up and pick up her 65 year old grandson)    Pain Onset  More than a month ago        Ther-Ex  Sidelying ER 2# DB 3x 10 with cuing for eccentric control with good carry over following Standing ER and IR RTB 3x 10 each with heavy cuing for eccentric control and posture with good carry over following Standing rows BTB 3x 10  Full can in scaption x10; with 2# 2x 10 with cuing for initial setup and posture with maintained scapular retraction with good carry over following Bicep curl 3# DB 3x 8 with cuing for eccentric control with 4 sec count with good carry over following YTI 3x 8 each position (minimal ROM with Y position) with TC initially for set up and periscapular activation with good carry over following Education on strengthening parameters                        PT Education - 04/10/19 1444    Education Details  therx form; HEP update    Person(s) Educated  Patient    Methods  Explanation;Demonstration;Tactile cues;Verbal cues    Comprehension  Verbalized understanding;Returned demonstration;Verbal cues required;Tactile cues required       PT Short Term Goals - 02/28/19 1548      PT SHORT TERM GOAL #1   Title  Patient will be independent with her HEP to promote ROM, strength, and function.     Baseline  Pt has started her HEP (02/28/2019)    Time  3    Period  Weeks    Status  New    Target Date  03/23/19        PT Long Term Goals - 02/28/19 1549      PT LONG TERM GOAL #1   Title  Patient will improve R shoulder flexion and scaption AROM to 140 degrees or more to promote  ability to reach.     Baseline  Supine R shoulder AAROM 113 degrees flexion, 120 degrees scaption (02/28/2019)    Time  8    Period  Weeks    Status  New    Target Date  04/27/19      PT LONG TERM GOAL #2   Title  Patient will improve R shoulder ER  and IR AROM to 80 degrees or more to promote ability to reach behind her back as well as behind her head.     Baseline  Supine R shoulder AAROM in scapular plane: 53 degrees ER, 66 degrees IR (02/28/2019)     Time  8    Period  Weeks    Status  New    Target Date  04/27/19      PT LONG TERM GOAL #3   Title  Patient will have at least 4+/5 R shoulder strength (flexion, scaption, ER, IR) to promote ability to raise her R arm, as well as to use it to perform functional tasks.     Baseline  R shoulder strength not yet tested (02/28/2019)    Time  8    Period  Weeks    Status  New    Target Date  04/27/19            Plan - 04/10/19 1445    Clinical Impression Statement  Patient able to complete all therex with accuracy following PT cuing (most deficit in eccentric control). Patient with good carry over of postural awareness from previous sessions. Patient reports feeling overwhelmed by amount of exercises for home. PT consolidated HEP to have more of a strengthening focus than AAROM, as patient has full PROM and full AROM (minimal difficulty with active IR at tihs time). Patient plans for next visit to be last visit if progress continues.     Personal Factors and Comorbidities  Age;Comorbidity 1    Comorbidities  CVA hx    Examination-Activity Limitations  Bathing;Carry;Dressing;Hygiene/Grooming;Lift;Reach Overhead;Toileting    Stability/Clinical Decision Making  Stable/Uncomplicated    Rehab Potential  Good    PT Frequency  2x / week    PT Duration  8 weeks    PT Treatment/Interventions  Therapeutic exercise;Therapeutic activities;Neuromuscular re-education;Patient/family education;Manual techniques;Dry needling;Electrical  Stimulation;Iontophoresis 4mg /ml Dexamethasone    PT Next Visit Plan  PROM, AAROM, scapular strengthening, AROM and strengthening when appropriate, follow protocol    Consulted and Agree with Plan of Care  Patient       Patient will benefit from skilled therapeutic intervention in order to improve the following deficits and impairments:  Pain, Postural dysfunction, Impaired UE functional use, Improper body mechanics, Decreased strength, Decreased range of motion  Visit Diagnosis: Muscle weakness (generalized)     Problem List Patient Active Problem List   Diagnosis Date Noted  . Cervical spondylosis with radiculopathy 10/25/2017  . Sinusitis, chronic 11/18/2016  . Dyspnea and respiratory abnormality 05/21/2016  . Wheeze 05/21/2016  . Chronic throat clearing 05/21/2016  . Pulmonary HTN ([place]) 04/23/2016  . Moderate persistent asthma 11/12/2015  . Allergic rhinoconjunctivitis 11/12/2015  . 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  . SOB (shortness of breath) 03/06/2015  . OSA (obstructive sleep apnea) 01/01/2014  . Benign essential HTN 01/01/2014  . Obesity 01/01/2014    Shelton Silvas 04/10/2019, 4:18 PM  [facility] [facility] [address], [place], [address] Phone: [phone]   Fax:  [phone]  Name: Amy Vang MRN: [id number] Date of Birth: [birth year]

## 2019-04-17 ENCOUNTER — Ambulatory Visit: Payer: Medicare Other

## 2019-04-17 ENCOUNTER — Other Ambulatory Visit: Payer: Self-pay

## 2019-04-17 DIAGNOSIS — M6281 Muscle weakness (generalized): Secondary | ICD-10-CM | POA: Diagnosis not present

## 2019-04-17 DIAGNOSIS — M25511 Pain in right shoulder: Secondary | ICD-10-CM

## 2019-04-17 DIAGNOSIS — M25611 Stiffness of right shoulder, not elsewhere classified: Secondary | ICD-10-CM

## 2019-04-17 NOTE — Therapy (Signed)
[facility] [address], [place], [address] Phone: [phone]   Fax:  [phone]  Physical Therapy Treatment And Progress Report (02/28/2019 - 04/17/2019)  Patient Details  Name: Amy Vang MRN: [id number] Date of Birth: 03-13-1946 Referring Provider (PT): Justice Britain, MD   Encounter Date: 04/17/2019  PT End of Session - 04/17/19 1306    Visit Number  11    Number of Visits  17    Date for PT Re-Evaluation  04/27/19    Authorization Type  1    Authorization Time Period  of 10 medicare (02/28/2019 eval)    PT Start Time  1306    PT Stop Time  1411    PT Time Calculation (min)  65 min    Activity Tolerance  Patient tolerated treatment well    Behavior During Therapy  [facility] for tasks assessed/performed       Past Medical History:  Diagnosis Date  . Acid reflux   . Actinic keratosis    Precancerous  . Allergic rhinitis   . Anxiety   . Asthma   . Breast mass   . CVA (cerebral infarction)   . Cystocele   . Degenerative disc disease, cervical   . Diastolic dysfunction   . GERD (gastroesophageal reflux disease) PT STATES SEVERE---- CONTROLLED W/ NEXIUM  . History of hiatal hernia   . History of TIAs AGE 5  AND 2008---  NO RESIDUAL  . Hypertension   . Non-specific gastrointestinal complaint   . OSA (obstructive sleep apnea)    Severe CPAP 12 MM H2O- Dr Radford Pax  . Pneumonia    hx  . PONV (postoperative nausea and vomiting)   . Pulmonary HTN ([place])    Moderate with PASP 45mHg by echo 03/2016  . Restless leg syndrome   . Rosacea   . Stroke ([facility]    tia's x3 last 4 yrs ago  . SUI (stress urinary incontinence, female)     Past Surgical History:  Procedure Laterality Date  . ANTERIOR CERVICAL DECOMP/DISCECTOMY FUSION N/A 10/25/2017   Procedure: CERVICAL SIX- CERVICAL SEVEN ANTERIOR CERVICAL DECOMPRESSION/DISCECTOMY FUSION, INTERBODY PROSTHESIS AND ANTERIOR PLATING;  Surgeon: JNewman Pies MD;  [facility]  Service: Neurosurgery;  Laterality: N/A;  CERVICAL 6- CERVICAL 7 ANTERIOR CERVICAL DECOMPRESSION/DISCECTOMY FUSION, INTERBODY PROSTHESIS AND ANTERIOR PLATING  . BILATERAL BENIGN BREAST BX'S  20 YRS AGO  . BREAST BIOPSY Right    benign  . BREAST EXCISIONAL BIOPSY Bilateral over 10 years ago   benign  . CARDIAC CATHETERIZATION N/A 08/13/2016   Procedure: Right Heart Cath;  Surgeon: DLarey Dresser MD;  [facility];  Service: Cardiovascular;  Laterality: N/A;  . CARPAL TUNNEL RELEASE  2009   BILATERAL  . CYSTOCELE REPAIR  12/11/2011   Procedure: ANTERIOR REPAIR (CYSTOCELE);  Surgeon: MClaybon Jabs MD;  [facility]  Service: Urology;  Laterality: N/A;  1 1/2 hour requested for case  Anterior repair and mid Urethral Sling  . NASAL SEPTOPLASTY W/ TURBINOPLASTY Bilateral 11/18/2016   Procedure: NASAL SEPTOPLASTY WITH BILATERAL TURBINATE REDUCTION;  Surgeon: DJerrell Belfast MD;  [place] [facility]  Service: ENT;  Laterality: Bilateral;  . NONINVASIVE VASCULAR CAROTID STUDY  09-14-2007   BILATERAL MILD MIX PLAQUE THROUGHOUT, NO SIGNIFICANT BILATERAL ICA STENOSIS  . PUBOVAGINAL SLING  12/11/2011   Procedure: PGaynelle Arabian  Surgeon: MClaybon Jabs MD;  [facility]  Service: Urology;  Laterality: N/A;  . PULLEY RELEASE RIGHT THUMB  2009  . SINUS ENDO WITH FUSION Bilateral 11/18/2016   Procedure: BILATERAL ENDOSCOPIC SINUS SURGERY;  Surgeon: Jerrell Belfast, MD;  Location: [place];  Service: ENT;  Laterality: Bilateral;  . VAGINAL HYSTERECTOMY  AGE 73    There were no vitals filed for this visit.  Subjective Assessment - 04/17/19 1308    Subjective  Used her R hand to switch out her seasonal clothes and it did not bother her. Does not know if she can continue the rest of the month from PT due to fincances.     Pertinent History  S/P R rotator cuff repair on 01/17/2019. Her next MD appointment is  this Friday. Did not wear her sling today because she did not want it exposed to COVID-19. Did not wear her sling much yesterday and it woke her up in the middle of the night.  Has been doing her pendulums at home.  Has not had PT since her surgery.  Pt is L hand dominant.  Pt states that the surgery went well.     Patient Stated Goals  Be able to use her R arm and be functional like before (be able to raise her arm up and pick up her 72 year old grandson)    Currently in Pain?  No/denies    Pain Score  0-No pain    Pain Onset  More than a month ago         [facility] PT Assessment - 04/17/19 1315      Strength   Right Shoulder Flexion  4/5    Right Shoulder ABduction  4+/5   scaption   Right Shoulder Internal Rotation  4+/5    Right Shoulder External Rotation  4+/5                           PT Education - 04/17/19 1311    Education Details  ther-ex, progress/current status with PT towards goals    Person(s) Educated  Patient    Methods  Explanation;Demonstration;Tactile cues;Verbal cues;Handout    Comprehension  Returned demonstration;Verbalized understanding      Objective   S/P13weeks  No latex band allergies   MedbridgeAccess Code: [email or web address]   Manual therapy Supine posterior glide R glenohumeral joint grade 3-  Then inferior glide grade 3-   Functional R shoulder IR improved to R thumb at T9  Seated STM R infraspinatus functional R shoulder IR improved to R thumb at T8    Therapeutic exercise  Possible graduation from PT next week if pt continues progress  Standing R shoulder AROM  Flexion: 141 degrees   Scaption: 140 degrees  Standing manually resisted R shoulder   Flexion 2x  scaption 1x  ER 2x  IR 1x  Supine R shoulder AROM at 90 degrees abduction  ER: 78 degrees  IR: 70 degrees  Reviewed progress/current status with PT towards goals.  Standing R shoulder IR green band 10x3  functional R shoulder IR  improved to R thumb at T8  Standing B scapular retraction blue band 10x3 with 5 second holds  Standing shoulder flexion 1 lb weight 4x. R shoulder catch felt. Eases with rest.   Standing R shoulder extension red band 10x3  No catching with shoulder flexion with 1 lb afterwards  Standing R shoulder flexion 1 lb weight 10x2  Improved exercise technique, movement at target joints, use of target muscles after mod verbal, visual,  tactile cues.     Response to treatment  Improving AROM and strength.Pt tolerated session well without aggravation of symptoms.    Clinical impression Pt demonstrates improving R shoulder AROM, 4 to 4+/5 strength, as well as improved function since initial evaluation. Pt steadily making progress towards goals and continues to demonstrate minimal to no R shoulder pain. Possible discharge to HEP next week if patient continues to make progress. Pt will benefit from continued skilled physical therapy services to improve ROM, strength and function.        PT Short Term Goals - 04/17/19 1425      PT SHORT TERM GOAL #1   Title  Patient will be independent with her HEP to promote ROM, strength, and function.     Baseline  Pt has started her HEP (02/28/2019); Pt demonnstrates independence with HEP (5//18/2020)    Time  3    Period  Weeks    Status  Achieved    Target Date  03/23/19        PT Long Term Goals - 04/17/19 1313      PT LONG TERM GOAL #1   Title  Patient will improve R shoulder flexion and scaption AROM to 140 degrees or more to promote ability to reach.     Baseline  Supine R shoulder AAROM 113 degrees flexion, 120 degrees scaption (02/28/2019); 141 degrees flexion, 140 degrees scaption AROM (04/17/2019)    Time  8    Period  Weeks    Status  Achieved    Target Date  04/27/19      PT LONG TERM GOAL #2   Title  Patient will improve R shoulder ER and IR AROM to 80 degrees or more to promote ability to reach behind her back as well as  behind her head.     Baseline  Supine R shoulder AAROM in scapular plane: 53 degrees ER, 66 degrees IR (02/28/2019); at 90 degrees abduction: 78 degrees ER, 70 degrees IR (04/17/2019)    Time  8    Period  Weeks    Status  Partially Met    Target Date  04/27/19      PT LONG TERM GOAL #3   Title  Patient will have at least 4+/5 R shoulder strength (flexion, scaption, ER, IR) to promote ability to raise her R arm, as well as to use it to perform functional tasks.     Baseline  R shoulder strength not yet tested (02/28/2019); 4+/5 for scaption, IR, ER, 4/5 flexion (04/17/2019)    Time  8    Period  Weeks    Status  Partially Met    Target Date  04/27/19            Plan - 04/17/19 1305    Clinical Impression Statement  Pt demonstrates improving R shoulder AROM, 4 to 4+/5 strength, as well as improved function since initial evaluation. Pt steadily making progress towards goals and continues to demonstrate minimal to no R shoulder pain. Possible discharge to HEP next week if patient continues to make progress. Pt will benefit from continued skilled physical therapy services to improve ROM, strength and function.     Personal Factors and Comorbidities  Age;Comorbidity 1    Comorbidities  CVA hx    Examination-Activity Limitations  Bathing;Carry;Dressing;Hygiene/Grooming;Lift;Reach Overhead;Toileting    Stability/Clinical Decision Making  Stable/Uncomplicated    Rehab Potential  Good    PT Frequency  2x / week    PT Duration  8  weeks    PT Treatment/Interventions  Therapeutic exercise;Therapeutic activities;Neuromuscular re-education;Patient/family education;Manual techniques;Dry needling;Electrical Stimulation;Iontophoresis 59m/ml Dexamethasone    PT Next Visit Plan  PROM, AAROM, scapular strengthening, AROM and strengthening when appropriate, follow protocol    Consulted and Agree with Plan of Care  Patient       Patient will benefit from skilled therapeutic intervention in order to  improve the following deficits and impairments:  Pain, Postural dysfunction, Impaired UE functional use, Improper body mechanics, Decreased strength, Decreased range of motion  Visit Diagnosis: Muscle weakness (generalized)  Stiffness of right shoulder, not elsewhere classified  Acute pain of right shoulder     Problem List Patient Active Problem List   Diagnosis Date Noted  . Cervical spondylosis with radiculopathy 10/25/2017  . Sinusitis, chronic 11/18/2016  . Dyspnea and respiratory abnormality 05/21/2016  . Wheeze 05/21/2016  . Chronic throat clearing 05/21/2016  . Pulmonary HTN ([place] 04/23/2016  . Moderate persistent asthma 11/12/2015  . Allergic rhinoconjunctivitis 11/12/2015  . 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  . SOB (shortness of breath) 03/06/2015  . OSA (obstructive sleep apnea) 01/01/2014  . Benign essential HTN 01/01/2014  . Obesity 01/01/2014   Thank you for your referral.  MJoneen BoersPT, DPT   04/17/2019, 2:42 PM  [facility] [address] [place] [address]Phone: [phone]  Fax:  [phone] Name: NCHASITIE PASSEYMRN: [id number] of Birth: [birth year]

## 2019-04-17 NOTE — Patient Instructions (Signed)
MedbridgeAccess Code: [email or web address]  Upgraded HEP   scapular retraction to blue band  IR to green band  Added :  Standing R shoulder extension red band 10x3   Standing R shoulder flexion 1 lb weight 10x2   Pt demonstrated and verbalized understanding.

## 2019-04-21 ENCOUNTER — Other Ambulatory Visit: Payer: Self-pay

## 2019-04-21 ENCOUNTER — Ambulatory Visit
Admission: RE | Admit: 2019-04-21 | Discharge: 2019-04-21 | Disposition: A | Discharge status: Home / Self Care | Payer: Medicare Other | Source: Ambulatory Visit | Attending: Family Medicine | Admitting: Family Medicine

## 2019-04-21 DIAGNOSIS — I340 Nonrheumatic mitral (valve) insufficiency: Secondary | ICD-10-CM | POA: Diagnosis present

## 2019-04-21 DIAGNOSIS — I10 Essential (primary) hypertension: Secondary | ICD-10-CM | POA: Diagnosis not present

## 2019-04-21 DIAGNOSIS — Z8673 Personal history of transient ischemic attack (TIA), and cerebral infarction without residual deficits: Secondary | ICD-10-CM | POA: Diagnosis not present

## 2019-04-21 NOTE — Progress Notes (Signed)
*  PRELIMINARY RESULTS* Echocardiogram 2D Echocardiogram has been performed.  Amy Vang 04/21/2019, 12:01 PM

## 2019-04-26 ENCOUNTER — Ambulatory Visit: Payer: Medicare Other

## 2019-05-01 ENCOUNTER — Ambulatory Visit: Payer: BC Managed Care – PPO

## 2019-05-02 ENCOUNTER — Telehealth: Payer: Self-pay | Admitting: Cardiovascular Disease

## 2019-05-02 NOTE — Telephone Encounter (Signed)
Virtual Visit Pre-Appointment Phone Call  "(Name), I am calling you today to discuss your upcoming appointment. We are currently trying to limit exposure to the virus that causes COVID-19 by seeing patients at home rather than in the office."  1. "What is the BEST phone number to call the day of the visit?" - include this in appointment notes  2. Do you have or have access to (through a family member/friend) a smartphone with video capability that we can use for your visit?" a. If yes - list this number in appt notes as cell (if different from BEST phone #) and list the appointment type as a VIDEO visit in appointment notes b. If no - list the appointment type as a PHONE visit in appointment notes  3. Confirm consent - "In the setting of the current Covid19 crisis, you are scheduled for a (phone or video) visit with your provider on (date) at (time).  Just as we do with many in-office visits, in order for you to participate in this visit, we must obtain consent.  If you'd like, I can send this to your mychart (if signed up) or email for you to review.  Otherwise, I can obtain your verbal consent now.  All virtual visits are billed to your insurance company just like a normal visit would be.  By agreeing to a virtual visit, we'd like you to understand that the technology does not allow for your provider to perform an examination, and thus may limit your provider's ability to fully assess your condition. If your provider identifies any concerns that need to be evaluated in person, we will make arrangements to do so.  Finally, though the technology is pretty good, we cannot assure that it will always work on either your or our end, and in the setting of a video visit, we may have to convert it to a phone-only visit.  In either situation, we cannot ensure that we have a secure connection.  Are you willing to proceed?" STAFF: Did the patient verbally acknowledge consent to telehealth visit? Document  YES/NO here: YES  4. Advise patient to be prepared - "Two hours prior to your appointment, go ahead and check your blood pressure, pulse, oxygen saturation, and your weight (if you have the equipment to check those) and write them all down. When your visit starts, your provider will ask you for this information. If you have an Apple Watch or Kardia device, please plan to have heart rate information ready on the day of your appointment. Please have a pen and paper handy nearby the day of the visit as well."  5. Give patient instructions for MyChart download to smartphone OR Doximity/Doxy.me as below if video visit (depending on what platform provider is using)  6. Inform patient they will receive a phone call 15 minutes prior to their appointment time (may be from unknown caller ID) so they should be prepared to answer    TELEPHONE CALL NOTE  Amy Vang has been deemed a candidate for a follow-up tele-health visit to limit community exposure during the Covid-19 pandemic. I spoke with the patient via phone to ensure availability of phone/video source, confirm preferred email & phone number, and discuss instructions and expectations.  I reminded Amy Vang to be prepared with any vital sign and/or heart rhythm information that could potentially be obtained via home monitoring, at the time of her visit. I reminded Amy Vang to expect a phone call prior to  her visit.  Ace Gins 05/02/2019 2:19 PM   INSTRUCTIONS FOR DOWNLOADING THE MYCHART APP TO SMARTPHONE  - The patient must first make sure to have activated MyChart and know their login information - If Apple, go to [employer] and type in MyChart in the search bar and download the app. If Android, ask patient to go to [employer] and type in [place] in the search bar and download the app. The app is free but as with any other app downloads, their phone may require them to verify saved payment information or  Apple/Android password.  - The patient will need to then log into the app with their MyChart username and password, and select [facility] as their healthcare provider to link the account. When it is time for your visit, go to the MyChart app, find appointments, and click Begin Video Visit. Be sure to Select Allow for your device to access the Microphone and Camera for your visit. You will then be connected, and your provider will be with you shortly.  **If they have any issues connecting, or need assistance please contact MyChart service desk [phone])**  **If using a computer, in order to ensure the best quality for their visit they will need to use either of the following Internet Browsers: [employer], or Google Chrome**  IF USING DOXIMITY or DOXY.ME - The patient will receive a link just prior to their visit by text.     FULL LENGTH CONSENT FOR TELE-HEALTH VISIT   I hereby voluntarily request, consent and authorize [facility] and its employed or contracted physicians, physician assistants, nurse practitioners or other licensed health care professionals (the Practitioner), to provide me with telemedicine health care services (the Services") as deemed necessary by the treating Practitioner. I acknowledge and consent to receive the Services by the Practitioner via telemedicine. I understand that the telemedicine visit will involve communicating with the Practitioner through live audiovisual communication technology and the disclosure of certain medical information by electronic transmission. I acknowledge that I have been given the opportunity to request an in-person assessment or other available alternative prior to the telemedicine visit and am voluntarily participating in the telemedicine visit.  I understand that I have the right to withhold or withdraw my consent to the use of telemedicine in the course of my care at any time, without affecting my right to future care  or treatment, and that the Practitioner or I may terminate the telemedicine visit at any time. I understand that I have the right to inspect all information obtained and/or recorded in the course of the telemedicine visit and may receive copies of available information for a reasonable fee.  I understand that some of the potential risks of receiving the Services via telemedicine include:   Delay or interruption in medical evaluation due to technological equipment failure or disruption;  Information transmitted may not be sufficient (e.g. poor resolution of images) to allow for appropriate medical decision making by the Practitioner; and/or   In rare instances, security protocols could fail, causing a breach of personal health information.  Furthermore, I acknowledge that it is my responsibility to provide information about my medical history, conditions and care that is complete and accurate to the best of my ability. I acknowledge that Practitioner's advice, recommendations, and/or decision may be based on factors not within their control, such as incomplete or inaccurate data provided by me or distortions of diagnostic images or specimens that may result from electronic transmissions. I understand  that the practice of medicine is not an exact science and that Practitioner makes no warranties or guarantees regarding treatment outcomes. I acknowledge that I will receive a copy of this consent concurrently upon execution via email to the email address I last provided but may also request a printed copy by calling the office of [facility].    I understand that my insurance will be billed for this visit.   I have read or had this consent read to me.  I understand the contents of this consent, which adequately explains the benefits and risks of the Services being provided via telemedicine.   I have been provided ample opportunity to ask questions regarding this consent and the Services and have had  my questions answered to my satisfaction.  I give my informed consent for the services to be provided through the use of telemedicine in my medical care  By participating in this telemedicine visit I agree to the above.

## 2019-05-29 ENCOUNTER — Telehealth: Payer: Self-pay

## 2019-05-29 NOTE — Progress Notes (Signed)
Cardiology Office Note  Date:  05/30/2019   ID:  Amy Vang, Amy Vang 1946/02/28, MRN [id number]  PCP:  Carol Ada, MD   Chief Complaint  Patient presents with  . other    Patient is here to establish care. Meds reviewed verbally with patient.     HPI:  Amy Vang is a 73 y.o. female with a history of  OSA, HTN  pulm HTN, by previous echocardiography previously seen [facility] August 2017 Presents for SOB, hypertension  Reports having severe GERD Symptoms better on Nexium 40 twice daily Having trouble obtaining the medication secondary to cost, formulary issues  Previous cardiac work-up reviewed with her  2D echo showed normal LVF with diastolic dysfunction, moderate TR and moderate pulmonary HTN.    chest CT angio which was negative for pulmonary emboli and PFTs showed reduced DLCO at 64% predicted.    CPAP.  She tolerates the mask and feels the pressure is adequate.  C6-7 herniated disc, spondylosis, cervicalgia, cervical radiculopathy  extensive workup for her dyspnea.   Echo showed elevated PA pressure at 46 mmHg with normal LV and RV systolic function.    CTA chest showed no PE or interstitial lung disease.   PFTs were normal except for decreased DLCO.   Cardiolite showed no ischemia or infarction.   CPX showed near-normal functional capacity, primarily limited by body habitus.  Echocardiogram May 2020  1. The left ventricle has normal systolic function with an ejection fraction of 60-65%. The cavity size was normal. There is mildly increased left ventricular wall thickness. Left ventricular diastolic Doppler parameters are consistent with impaired  relaxation.  2. The right ventricle has normal systolic function. The cavity was normal. There is no increase in right ventricular wall thickness.Unable to estimate RVSP  3. Aortic valve regurgitation is mild by color flow Doppler.  4. Mild mitral valve regurgitation  Right heart catheterization September  2017 Dr. Algernon Huxley RA mean 2 RV 33/6 PA 34/16, mean 22 PCWP mean 9 Oxygen saturations: PA 63% AO 93% Cardiac Output (Fick) 5.22  Cardiac Index (Fick) 2.72  In general she reports that she is doing well, breathing is stable, denies any leg swelling abdominal bloating PND orthopnea Would like to go to the beach to be with her family and play her with 67-year-old grandchild  EKG personally reviewed by myself on todays visit Shows normal sinus rhythm rate 77 bpm T wave abnormality V6 1 and aVL, left axis deviation   PMH:   has a past medical history of Acid reflux, Actinic keratosis, Allergic rhinitis, Anxiety, Asthma, Breast mass, CVA (cerebral infarction), Cystocele, Degenerative disc disease, cervical, Diastolic dysfunction, GERD (gastroesophageal reflux disease) (PT STATES SEVERE---- CONTROLLED W/ NEXIUM), History of hiatal hernia, History of TIAs (AGE 23  AND 2008---  NO RESIDUAL), Hypertension, Non-specific gastrointestinal complaint, OSA (obstructive sleep apnea), Pneumonia, PONV (postoperative nausea and vomiting), Pulmonary HTN ([place]), Restless leg syndrome, Rosacea, Stroke ([place]), and SUI (stress urinary incontinence, female).  PSH:    Past Surgical History:  Procedure Laterality Date  . ANTERIOR CERVICAL DECOMP/DISCECTOMY FUSION N/A 10/25/2017   Procedure: CERVICAL SIX- CERVICAL SEVEN ANTERIOR CERVICAL DECOMPRESSION/DISCECTOMY FUSION, INTERBODY PROSTHESIS AND ANTERIOR PLATING;  Surgeon: Newman Pies, MD;  Location: [facility];  Service: Neurosurgery;  Laterality: N/A;  CERVICAL 6- CERVICAL 7 ANTERIOR CERVICAL DECOMPRESSION/DISCECTOMY FUSION, INTERBODY PROSTHESIS AND ANTERIOR PLATING  . BILATERAL BENIGN BREAST BX'S  20 YRS AGO  . BREAST BIOPSY Right    benign  . BREAST EXCISIONAL BIOPSY Bilateral over 10 years  ago   benign  . CARDIAC CATHETERIZATION N/A 08/13/2016   Procedure: Right Heart Cath;  Surgeon: Larey Dresser, MD;  Location: [facility];  Service: Cardiovascular;   Laterality: N/A;  . CARPAL TUNNEL RELEASE  2009   BILATERAL  . CYSTOCELE REPAIR  12/11/2011   Procedure: ANTERIOR REPAIR (CYSTOCELE);  Surgeon: Claybon Jabs, MD;  Location: [facility];  Service: Urology;  Laterality: N/A;  1 1/2 hour requested for case  Anterior repair and mid Urethral Sling  . NASAL SEPTOPLASTY W/ TURBINOPLASTY Bilateral 11/18/2016   Procedure: NASAL SEPTOPLASTY WITH BILATERAL TURBINATE REDUCTION;  Surgeon: Jerrell Belfast, MD;  Location: [facility];  Service: ENT;  Laterality: Bilateral;  . NONINVASIVE VASCULAR CAROTID STUDY  09-14-2007   BILATERAL MILD MIX PLAQUE THROUGHOUT, NO SIGNIFICANT BILATERAL ICA STENOSIS  . PUBOVAGINAL SLING  12/11/2011   Procedure: Gaynelle Arabian;  Surgeon: Claybon Jabs, MD;  Location: [facility];  Service: Urology;  Laterality: N/A;  . PULLEY RELEASE RIGHT THUMB  2009  . SINUS ENDO WITH FUSION Bilateral 11/18/2016   Procedure: BILATERAL ENDOSCOPIC SINUS SURGERY;  Surgeon: Jerrell Belfast, MD;  Location: [place];  Service: ENT;  Laterality: Bilateral;  . VAGINAL HYSTERECTOMY  AGE 74    Current Outpatient Medications  Medication Sig Dispense Refill  . CALCIUM-MAGNESIUM-VITAMIN D PO Take 1 tablet by mouth daily.    . citalopram (CELEXA) 20 MG tablet 10 mg oral daily  2  . cyclobenzaprine (FLEXERIL) 10 MG tablet Take 1 tablet (10 mg total) by mouth 3 (three) times daily as needed for muscle spasms. 50 tablet 1  . docusate sodium (COLACE) 100 MG capsule Take 1 capsule (100 mg total) by mouth 2 (two) times daily. 60 capsule 0  . esomeprazole (NEXIUM) 40 MG capsule Take 40 mg by mouth 2 (two) times daily. Brand Name Only    . fexofenadine (ALLEGRA) 60 MG tablet Take 60 mg by mouth daily.    . fluticasone (FLONASE) 50 MCG/ACT nasal spray Place 2 sprays into both nostrils 2 (two) times daily.     . polyethylene glycol (MIRALAX / GLYCOLAX) packet Take 17 g by mouth daily as needed for mild constipation.     . potassium  chloride (MICRO-K) 10 MEQ CR capsule Take 10 mEq by mouth 2 (two) times daily.     . Probiotic Product (ALIGN PO) Take 1 tablet by mouth daily.    . sodium chloride (OCEAN) 0.65 % SOLN nasal spray Place 1 spray into both nostrils 4 (four) times daily as needed for congestion.     . traZODone (DESYREL) 50 MG tablet Take 50 mg by mouth at bedtime.     No current facility-administered medications for this visit.      Allergies:   Penicillins, Hydroxyzine hcl, Lactose intolerance (gi), and Septra [sulfamethoxazole-trimethoprim]   Social History:  The patient  reports that she has never smoked. She has never used smokeless tobacco. She reports that she does not drink alcohol or use drugs.   Family History:   family history includes Alzheimer's disease in her mother; CAD in her brother; Colon cancer in her maternal aunt; Diabetes in her brother, mother, and sister; Heart attack in her brother and paternal grandfather; Hypertension in her sister; Leukemia in her brother; Stroke in her mother and paternal grandmother. She was adopted.    Review of Systems: Review of Systems  Constitutional: Negative.   HENT: Negative.   Respiratory: Negative.   Cardiovascular: Negative.  Gastrointestinal: Negative.   Musculoskeletal: Negative.   Neurological: Negative.   Psychiatric/Behavioral: Negative.   All other systems reviewed and are negative.   PHYSICAL EXAM: VS:  BP (!) 144/60 (BP Location: Left Arm, Patient Position: Sitting, Cuff Size: Normal)   Pulse 77   Ht 5\' 5"  (1.651 m)   Wt 177 lb (80.3 kg)   BMI 29.45 kg/m  , BMI Body mass index is 29.45 kg/m. GEN: Well nourished, well developed, in no acute distress  HEENT: normal  Neck: no JVD, carotid bruits, or masses Cardiac: RRR; no murmurs, rubs, or gallops,no edema  Respiratory:  clear to auscultation bilaterally, normal work of breathing GI: soft, nontender, nondistended, + BS MS: no deformity or atrophy  Skin: warm and dry, no  rash Neuro:  Strength and sensation are intact Psych: euthymic mood, full affect   Recent Labs: No results found for requested labs within last 8760 hours.    Lipid Panel Lab Results  Component Value Date   CHOL 142 04/12/2012   HDL 29 (L) 04/12/2012   LDLCALC 75 04/12/2012   TRIG 192 (H) 04/12/2012      Wt Readings from Last 3 Encounters:  05/30/19 177 lb (80.3 kg)  10/18/17 190 lb 6.4 oz (86.4 kg)  02/16/17 199 lb (90.3 kg)       ASSESSMENT AND PLAN:  Problem List Items Addressed This Visit      Cardiology Problems   Pulmonary HTN ([place]) - Primary   Relevant Medications   hydrochlorothiazide (HYDRODIURIL) 25 MG tablet   Other Relevant Orders   EKG 12-Lead   Benign essential HTN   Relevant Medications   hydrochlorothiazide (HYDRODIURIL) 25 MG tablet   Other Relevant Orders   EKG 12-Lead    Other Visit Diagnoses    Nonrheumatic mitral valve regurgitation       Relevant Medications   hydrochlorothiazide (HYDRODIURIL) 25 MG tablet       Hypertension well controlled,  no changes to her medications  Shortness of breath/pulmonary hypertension Stable, right heart catheterization discussed with her, stable low/normal numbers No further work-up needed  GERD On PPI Does better on Nexium  Disposition:   F/U as needed  No orders of the defined types were placed in this encounter.    Signed, Esmond Plants, M.D., Ph.D. 05/30/2019  [place], [place]

## 2019-05-29 NOTE — Telephone Encounter (Signed)

## 2019-05-30 ENCOUNTER — Other Ambulatory Visit: Payer: Self-pay

## 2019-05-30 ENCOUNTER — Encounter: Payer: Self-pay | Admitting: Cardiovascular Disease

## 2019-05-30 ENCOUNTER — Ambulatory Visit (INDEPENDENT_AMBULATORY_CARE_PROVIDER_SITE_OTHER): Payer: Medicare Other | Admitting: Cardiovascular Disease

## 2019-05-30 VITALS — BP 144/60 | HR 77 | Ht 65.0 in | Wt 177.0 lb

## 2019-05-30 DIAGNOSIS — G4733 Obstructive sleep apnea (adult) (pediatric): Secondary | ICD-10-CM

## 2019-05-30 DIAGNOSIS — I272 Pulmonary hypertension, unspecified: Secondary | ICD-10-CM

## 2019-05-30 DIAGNOSIS — I340 Nonrheumatic mitral (valve) insufficiency: Secondary | ICD-10-CM | POA: Diagnosis not present

## 2019-05-30 DIAGNOSIS — I10 Essential (primary) hypertension: Secondary | ICD-10-CM | POA: Diagnosis not present

## 2019-05-30 NOTE — Patient Instructions (Signed)

## 2019-07-10 ENCOUNTER — Other Ambulatory Visit: Payer: Self-pay | Admitting: Urology

## 2019-07-12 ENCOUNTER — Other Ambulatory Visit: Payer: Self-pay | Admitting: Family Medicine

## 2019-07-12 DIAGNOSIS — Z1231 Encounter for screening mammogram for malignant neoplasm of breast: Secondary | ICD-10-CM

## 2019-07-19 ENCOUNTER — Inpatient Hospital Stay: Admission: RE | Admit: 2019-07-19 | Payer: Medicare Other | Source: Ambulatory Visit

## 2019-07-20 ENCOUNTER — Other Ambulatory Visit: Payer: Self-pay

## 2019-07-20 ENCOUNTER — Other Ambulatory Visit
Admission: RE | Admit: 2019-07-20 | Discharge: 2019-07-20 | Disposition: A | Discharge status: Home / Self Care | Payer: Medicare Other | Source: Ambulatory Visit | Attending: Urology | Admitting: Urology

## 2019-07-20 DIAGNOSIS — Z20828 Contact with and (suspected) exposure to other viral communicable diseases: Secondary | ICD-10-CM | POA: Insufficient documentation

## 2019-07-20 DIAGNOSIS — Z01812 Encounter for preprocedural laboratory examination: Secondary | ICD-10-CM | POA: Insufficient documentation

## 2019-07-20 LAB — SARS CORONAVIRUS 2 (TAT 6-24 HRS): SARS Coronavirus 2: NEGATIVE

## 2019-07-21 ENCOUNTER — Encounter (HOSPITAL_BASED_OUTPATIENT_CLINIC_OR_DEPARTMENT_OTHER): Payer: Self-pay | Admitting: *Deleted

## 2019-07-21 ENCOUNTER — Other Ambulatory Visit: Payer: Self-pay

## 2019-07-21 NOTE — Progress Notes (Signed)
Spoke w/ pt via phone for pre-op interview.  Npo after mn.  Arrive at [employer].  Needs istat 8.  Current ekg in chart and epic.  Pt had covid test done yesterday.  Will take celexa, nexium, allergra, and align am dos w/ sips of water.

## 2019-07-21 NOTE — Progress Notes (Signed)
Verified consent order with Coni in Dr. Simone Curia office. Apparently the foreign body is a suture from a previous urologic  Procedure.

## 2019-07-22 NOTE — H&P (Signed)
HPI: Amy Vang is a 73 year-old female with a vaginal foreign body (stich).  She does have a history of urinary infections. 0 infections required hospitalization. The following are the medication(s) that have been tried: macrobid.   She has not had a kidney stone. She feels that she does empty her bladder. She has gone through menopause. She has not been on hormone replacement therapy.   06/14/18: She reports that on 05/27/18 she began to have symptoms a UTI that consisted of dysuria, frequency and small frequent voidings as well as a feeling of need to urinate soon after voiding. No flank pain or hematuria. This prompted a urine culture and she was placed on antibiotic therapy with resolution of her symptoms however she said that her urine remained cloudy. A 2nd round of antibiotics was prescribed and she was referred. She is now completely free of symptoms and her urine is no longer cloudy. She has completed her antibiotics. She reported having no flank pain or other symptoms to suggest an upper tract infection.   Urine culture 10/12/16 - ESBL E. coli  Urine culture 05/30/18 - ESBL E. coli  Urine culture 06/09/18 - ESBL E. coli      CC/HPI: 06/19/19: The patient reports that for approximately 6 months she has had some slight blood spotting her vagina. She said occasionally it will be dark brown and other times she is seen some bright red blood. This is not been associated with any vaginal pain or discomfort. She has not seen any blood in the urine and reports that she has no voiding symptoms. She has undergone previous sling as well as cystocele repair and underwent pelvic exam by Dr. Tamala Julian who noted what appeared to be a green suture in the vagina. I discussed this with her and told her that in fact the anterior repair is performed with a permanent suture that is in fact green. I have recommended the patient come in for further evaluation.     ALLERGIES: Hydroxyzines Penicillins Septra DS  TABS    MEDICATIONS: Ambien 5 MG Oral Tablet Oral  Aspirin 81 MG TABS Oral  Calcium TABS Oral  Citalopram Hbr 20 mg tablet Oral  Clonazepam 1 mg tablet Oral  Fish Oil 300 mg (120 mg-180 mg)-1,000 mg capsule,delayed release Oral  HydroCHLOROthiazide 25 MG Oral Tablet Oral  Nexium 40 mg capsule,delayed release Oral  Nitrofurantoin 100 mg capsule  Potassium Chloride 10 meq capsule, extended release Oral  Qvar 40 mcg/actuation aerosol with adapter Inhalation  Requip 2 MG Oral Tablet Oral  Ropinirole Hcl 2 mg tablet Oral  Singulair 10 MG Oral Tablet Oral  Urogesic-Blue 81.6 mg-40.8 mg-10.8 mg-0.12 mg tablet 1 tablet PO Q 4 H PRN  VESIcare 5 MG Oral Tablet Oral  Vitamin C TABS Oral  Vitamin E 400 unit tablet Oral  Zantac 300 mg tablet Oral     GU PSH: Bladder Repair - 2013 Hysterectomy Unilat SO - 2012 Sling - 2013       PSH Notes: Vaginal Sling Operation For Stress Incontinence, Anterior Colporrhaphy, Repair Of Cystocele, Hysterectomy, Breast Surgery Lumpectomy   NON-GU PSH: Remove Breast Lesion - 2012     GU PMH: Acute Cystitis/UTI, She appears to have a recurrent or possibly incompletely treated UTI. I told her that I would reculture her urine today and based on her previous culture I am going to reinitiate therapy with Macrodantin over the weekend because she is so symptomatic. I told her that when the culture results return  I would likely need to switch her antibiotics but I will await sensitivities. In the meantime I'm also going to give her samples of medication to help with her bladder symptoms. - 2017 Incontinence w/o Sensation, Urinary incontinence without sensory awareness - 2017 Rectocele, Rectocele, female - 2017 Cystocele, midline, Cystocele - 2014 Mixed incontinence, Urge And Stress Incontinence - 2014      PMH Notes: SUI: She had stress incontinence and a cystocele having undergone a hysterectomy in her late 21s.  Urodynamics (06/04/11): Low leak point pressure  with low amplitude instability that was improved by reduction of her cystocele.  Treatment: Trans obturator sling and anterior repair in 1/13.     NON-GU PMH: Full incontinence of feces, Fecal incontinence - 2017 Muscle weakness (generalized), Muscle weakness - 2017 Other lack of coordination, Muscular incoordination - 2017 Anxiety, Anxiety - 2017 Encounter for general adult medical examination without abnormal findings, Encounter for preventive health examination - 2017 Personal history of other diseases of the musculoskeletal system and connective tissue, History of arthritis - 2017 Personal history of other diseases of the nervous system and sense organs, History of sleep apnea - 2017 Personal history of other diseases of the respiratory system, History of asthma - 2017 Actinic keratosis, Actinic Keratosis - 2014 Cerebral infarction, unspecified, Stroke Syndrome - 2014 Personal history of diseases of the skin and subcutaneous tissue, History of rosacea - 2014 Personal history of other diseases of the circulatory system, History of hypertension - 2014 Personal history of other diseases of the digestive system, History of esophageal reflux - 2014 Restless legs syndrome, Restless Legs Syndrome - 2014    FAMILY HISTORY: Acute Myocardial Infarction - Runs In Family Alzheimer's Disease - Runs In Family Colon Cancer - Runs In Family Death of family member - Runs In Family Diabetes - Runs In Family, Runs In Family Family Health Status Number - Runs In Family Nephrectomy - Runs In Family   SOCIAL HISTORY: Marital Status: Married Preferred Language: English; Ethnicity: Not Hispanic Or Latino; Race: White Current Smoking Status: Patient has never smoked.  Has never drank.  Drinks 1 caffeinated drink per day. Has not had a blood transfusion. Patient's occupation is/was Retired.     Notes: Occupation:, Retired, Alcohol Use (History), Tobacco Use, Marital History - Currently Married,  Caffeine Use, Never A Smoker   REVIEW OF SYSTEMS:    GU Review Female:   Patient denies frequent urination, hard to postpone urination, burning /pain with urination, get up at night to urinate, leakage of urine, stream starts and stops, trouble starting your stream, have to strain to urinate, and being pregnant.  Gastrointestinal (Upper):   Patient denies nausea, vomiting, and indigestion/ heartburn.  Gastrointestinal (Lower):   Patient denies diarrhea and constipation.  Constitutional:   Patient denies fever, night sweats, weight loss, and fatigue.  Skin:   Patient denies itching and skin rash/ lesion.  Eyes:   Patient denies blurred vision and double vision.  Ears/ Nose/ Throat:   Patient denies sore throat and sinus problems.  Hematologic/Lymphatic:   Patient denies swollen glands and easy bruising.  Cardiovascular:   Patient denies leg swelling and chest pains.  Respiratory:   Patient denies cough and shortness of breath.  Endocrine:   Patient denies excessive thirst.  Musculoskeletal:   Patient denies back pain and joint pain.  Neurological:   Patient denies headaches and dizziness.  Psychologic:   Patient denies depression and anxiety.   VITAL SIGNS:    Weight 172 lb / 78.02  kg  Height 65 in / 165.1 cm  BP 108/58 mmHg  Pulse 63 /min  Temperature 97.7 F / 36.5 C  BMI 28.6 kg/m   GU PHYSICAL EXAMINATION:    Urethral Meatus: Normal size. Normal position. No discharge.  Urethra: No tenderness, no mass, no scarring. No hypermobility. No leakage.  Bladder: Normal to palpation, no tenderness, no mass, normal size.  Vagina: No atrophy, no stenosis. No rectocele. No cystocele. No enterocele. There is a small portion of green suture noted in the midline in the posterior aspect of the vagina. It is visible and also palpable with an associated knot. No induration or signs of infection were noted.   MULTI-SYSTEM PHYSICAL EXAMINATION:    Physical Exam  Constitutional: Well nourished and  well developed . No acute distress.   ENT:. The ears and nose are normal in appearance.   Neck: The appearance of the neck is normal and no neck mass is present.   Pulmonary: No respiratory distress and normal respiratory rhythm and effort.   Cardiovascular: Heart rate and rhythm are normal . No peripheral edema.   Abdomen: The abdomen is soft and nontender. No masses are palpated. No CVA tenderness. No hernias are palpable. No hepatosplenomegaly noted.   Lymphatics: The femoral and inguinal nodes are not enlarged or tender.   Skin: Normal skin turgor, no visible rash and no visible skin lesions.   Neuro/Psych:. Mood and affect are appropriate.     PAST DATA REVIEWED:  Source Of History:  Patient  Records Review:   Previous Patient Records, POC Tool   PROCEDURES:          Urinalysis Dipstick Dipstick Cont'd  Color: Straw Bilirubin: Neg mg/dL  Appearance: Clear Ketones: Neg mg/dL  Specific Gravity: 1.010 Blood: Neg ery/uL  pH: 6.0 Protein: Neg mg/dL  Glucose: Neg mg/dL Urobilinogen: 0.2 mg/dL    Nitrites: Neg    Leukocyte Esterase: Neg leu/uL    ASSESSMENT/PLAN:      ICD-10 Details  1 NON-GU:   Superficial foreign body of vagina and vulva, initial encounter [id number] She is going to be scheduled for excision of her vaginal foreign body as an outpatient.          Notes:   One of the sutures used for her anterior repair is visible and palpable in the posterior aspect of her vagina. It was uncomfortable to have it grasped so I do not think trying to remove it in the office is going to be feasible and I think I can do a better job and make sure that I get all of the foreign tissue removed and repair any vaginal mucosa if necessary. We did discuss how the operation would be performed as well as its risks and complications and she was concerned that her cystocele would reform. I told her while that was a slight possibility this is only a single suture and I used multiple interrupted  sutures as well as this being in the posterior aspect of the vagina there is a low probability of redevelopment of a significant cystocele. She was concerned because she said she was very pleased with the repair. We discussed the outpatient nature of the procedure as well.

## 2019-07-22 NOTE — Discharge Instructions (Addendum)
REMOVE VAGINAL PACKING TOMORROW MORNING (07/25/2019).  Post vaginal surgery  Diet:  You may return to your normal diet within 24 hours following your surgery. You may note some mild nausea and possibly vomiting the first 6-8 hours following surgery. This is usually due to the side effects of anesthesia, and will disappear quite soon. I would suggest clear liquids and a very light meal the first evening following your surgery.  Activity:  Your physical activity is to be minimally restricted foe 48 hrs. During this time use the following guidelines:   No lifting heavy objects (anything greater than 10 pounds).  No driving a car and limit long car rides.  No strenuous exercise, limits stairclimbing to a minimum.  Bowels:  You may need a stool softener and. A bowel movement every other day is reasonable. Use a mild laxative if needed, such as milk of magnesia 2-3 tablespoons, or 2 Dulcolax tablets. Call if you continue to have problems. If you had been taking narcotics for pain, before, during or after your surgery, you may be constipated. Take a laxative if necessary.  Medication:  You should resume your pre-surgery medications unless told not to. In addition you may be given an antibiotic to prevent or treat infection. Antibiotics are not always necessary. Pain pills ((hydrocodone or oxycodone) may also be given to help with the incision and catheter discomfort. Tylenol (acetaminophen) or Advil (ibuprofen) which have no narcotics and are better if the pain is not too bad. All medication should be taken as prescribed until the bottles are finished unless you are having an unusual reaction to one of the drugs.  Problems you should report to [place]:  a. Fever greater than 101F. b. Heavy bleeding, or clots (see notes above about blood in urine). c. Inability to urinate. d. Drug reactions (hives, rash, nausea, vomiting, diarrhea). e. Severe burning or pain with urination that is not  improving.   Post Anesthesia Home Care Instructions  Activity: Get plenty of rest for the remainder of the day. A responsible individual must stay with you for 24 hours following the procedure.  For the next 24 hours, DO NOT: -Drive a car -Paediatric nurse -Drink alcoholic beverages -Take any medication unless instructed by your physician -Make any legal decisions or sign important papers.  Meals: Start with liquid foods such as gelatin or soup. Progress to regular foods as tolerated. Avoid greasy, spicy, heavy foods. If nausea and/or vomiting occur, drink only clear liquids until the nausea and/or vomiting subsides. Call your physician if vomiting continues.  Special Instructions/Symptoms: Your throat may feel dry or sore from the anesthesia or the breathing tube placed in your throat during surgery. If this causes discomfort, gargle with warm salt water. The discomfort should disappear within 24 hours.

## 2019-07-24 ENCOUNTER — Other Ambulatory Visit: Payer: Self-pay

## 2019-07-24 ENCOUNTER — Ambulatory Visit (HOSPITAL_BASED_OUTPATIENT_CLINIC_OR_DEPARTMENT_OTHER): Payer: Medicare Other | Admitting: Anesthesiology

## 2019-07-24 ENCOUNTER — Encounter (HOSPITAL_BASED_OUTPATIENT_CLINIC_OR_DEPARTMENT_OTHER): Payer: Self-pay | Admitting: Certified Registered"

## 2019-07-24 ENCOUNTER — Ambulatory Visit (HOSPITAL_BASED_OUTPATIENT_CLINIC_OR_DEPARTMENT_OTHER)
Admission: RE | Admit: 2019-07-24 | Discharge: 2019-07-24 | Disposition: A | Discharge status: Home / Self Care | Payer: Medicare Other | Attending: Urology | Admitting: Urology

## 2019-07-24 ENCOUNTER — Encounter (HOSPITAL_BASED_OUTPATIENT_CLINIC_OR_DEPARTMENT_OTHER)
Admission: RE | Disposition: A | Discharge status: Home / Self Care | Payer: Self-pay | Source: Home / Self Care | Attending: Urology

## 2019-07-24 DIAGNOSIS — I10 Essential (primary) hypertension: Secondary | ICD-10-CM | POA: Diagnosis not present

## 2019-07-24 DIAGNOSIS — M1990 Unspecified osteoarthritis, unspecified site: Secondary | ICD-10-CM | POA: Insufficient documentation

## 2019-07-24 DIAGNOSIS — X58XXXA Exposure to other specified factors, initial encounter: Secondary | ICD-10-CM | POA: Insufficient documentation

## 2019-07-24 DIAGNOSIS — J45909 Unspecified asthma, uncomplicated: Secondary | ICD-10-CM | POA: Diagnosis not present

## 2019-07-24 DIAGNOSIS — Z79899 Other long term (current) drug therapy: Secondary | ICD-10-CM | POA: Insufficient documentation

## 2019-07-24 DIAGNOSIS — Z8673 Personal history of transient ischemic attack (TIA), and cerebral infarction without residual deficits: Secondary | ICD-10-CM | POA: Diagnosis not present

## 2019-07-24 DIAGNOSIS — G2581 Restless legs syndrome: Secondary | ICD-10-CM | POA: Diagnosis not present

## 2019-07-24 DIAGNOSIS — Z7982 Long term (current) use of aspirin: Secondary | ICD-10-CM | POA: Diagnosis not present

## 2019-07-24 DIAGNOSIS — T192XXA Foreign body in vulva and vagina, initial encounter: Secondary | ICD-10-CM | POA: Insufficient documentation

## 2019-07-24 DIAGNOSIS — Z6828 Body mass index (BMI) 28.0-28.9, adult: Secondary | ICD-10-CM | POA: Insufficient documentation

## 2019-07-24 DIAGNOSIS — Z7951 Long term (current) use of inhaled steroids: Secondary | ICD-10-CM | POA: Insufficient documentation

## 2019-07-24 DIAGNOSIS — K219 Gastro-esophageal reflux disease without esophagitis: Secondary | ICD-10-CM | POA: Diagnosis not present

## 2019-07-24 DIAGNOSIS — M795 Residual foreign body in soft tissue: Secondary | ICD-10-CM

## 2019-07-24 HISTORY — DX: Personal history of other medical treatment: Z92.89

## 2019-07-24 HISTORY — DX: Nocturia: R35.1

## 2019-07-24 HISTORY — PX: MASS EXCISION: SHX2000

## 2019-07-24 HISTORY — DX: Obstructive sleep apnea (adult) (pediatric): G47.33

## 2019-07-24 HISTORY — DX: Essential (primary) hypertension: I10

## 2019-07-24 HISTORY — DX: Other asthma: J45.998

## 2019-07-24 HISTORY — DX: Other constipation: K59.09

## 2019-07-24 HISTORY — DX: Diaphragmatic hernia without obstruction or gangrene: K44.9

## 2019-07-24 LAB — POCT I-STAT, CHEM 8
BUN: 14 mg/dL (ref 8–23)
Calcium, Ion: 1.21 mmol/L (ref 1.15–1.40)
Chloride: 96 mmol/L — ABNORMAL LOW (ref 98–111)
Creatinine, Ser: 0.6 mg/dL (ref 0.44–1.00)
Glucose, Bld: 99 mg/dL (ref 70–99)
HCT: 36 % (ref 36.0–46.0)
Hemoglobin: 12.2 g/dL (ref 12.0–15.0)
Potassium: 3.3 mmol/L — ABNORMAL LOW (ref 3.5–5.1)
Sodium: 137 mmol/L (ref 135–145)
TCO2: 27 mmol/L (ref 22–32)

## 2019-07-24 SURGERY — EXCISION MASS
Anesthesia: General | Site: Vagina

## 2019-07-24 MED ORDER — METOCLOPRAMIDE HCL 5 MG/ML IJ SOLN
INTRAMUSCULAR | Status: AC
  Filled 2019-07-24: qty 2

## 2019-07-24 MED ORDER — PROPOFOL 10 MG/ML IV BOLUS
INTRAVENOUS | Status: AC
  Filled 2019-07-24: qty 20

## 2019-07-24 MED ORDER — DEXAMETHASONE SODIUM PHOSPHATE 10 MG/ML IJ SOLN
INTRAMUSCULAR | Status: AC
  Filled 2019-07-24: qty 1

## 2019-07-24 MED ORDER — DEXAMETHASONE SODIUM PHOSPHATE 10 MG/ML IJ SOLN
INTRAMUSCULAR | Status: DC | PRN
  Administered 2019-07-24: 4 mg via INTRAVENOUS

## 2019-07-24 MED ORDER — PROPOFOL 10 MG/ML IV BOLUS
INTRAVENOUS | Status: DC | PRN
  Administered 2019-07-24: 150 mg via INTRAVENOUS

## 2019-07-24 MED ORDER — MIDAZOLAM HCL 2 MG/2ML IJ SOLN
INTRAMUSCULAR | Status: AC
  Filled 2019-07-24: qty 2

## 2019-07-24 MED ORDER — ONDANSETRON HCL 4 MG/2ML IJ SOLN
INTRAMUSCULAR | Status: DC | PRN
  Administered 2019-07-24: 4 mg via INTRAVENOUS

## 2019-07-24 MED ORDER — FENTANYL CITRATE (PF) 100 MCG/2ML IJ SOLN
INTRAMUSCULAR | Status: DC | PRN
  Administered 2019-07-24: 50 ug via INTRAVENOUS

## 2019-07-24 MED ORDER — FENTANYL CITRATE (PF) 100 MCG/2ML IJ SOLN
25.0000 ug | INTRAMUSCULAR | Status: DC | PRN
  Filled 2019-07-24: qty 1

## 2019-07-24 MED ORDER — BUPIVACAINE-EPINEPHRINE 0.5% -1:200000 IJ SOLN
INTRAMUSCULAR | Status: DC | PRN
  Administered 2019-07-24: 2 mL

## 2019-07-24 MED ORDER — FENTANYL CITRATE (PF) 100 MCG/2ML IJ SOLN
INTRAMUSCULAR | Status: AC
  Filled 2019-07-24: qty 2

## 2019-07-24 MED ORDER — OXYCODONE HCL 5 MG PO TABS
5.0000 mg | ORAL_TABLET | Freq: Once | ORAL | Status: DC | PRN
  Filled 2019-07-24: qty 1

## 2019-07-24 MED ORDER — ONDANSETRON HCL 4 MG/2ML IJ SOLN
4.0000 mg | Freq: Once | INTRAMUSCULAR | Status: DC | PRN
  Filled 2019-07-24: qty 2

## 2019-07-24 MED ORDER — ACETAMINOPHEN 325 MG PO TABS
325.0000 mg | ORAL_TABLET | ORAL | Status: DC | PRN
  Filled 2019-07-24: qty 2

## 2019-07-24 MED ORDER — ONDANSETRON HCL 4 MG/2ML IJ SOLN
INTRAMUSCULAR | Status: AC
  Filled 2019-07-24: qty 2

## 2019-07-24 MED ORDER — PROPOFOL 500 MG/50ML IV EMUL
INTRAVENOUS | Status: DC | PRN
  Administered 2019-07-24: 200 ug/kg/min via INTRAVENOUS

## 2019-07-24 MED ORDER — ACETAMINOPHEN 160 MG/5ML PO SOLN
325.0000 mg | ORAL | Status: DC | PRN
  Filled 2019-07-24: qty 20.3

## 2019-07-24 MED ORDER — LACTATED RINGERS IV SOLN
INTRAVENOUS | Status: DC
  Administered 2019-07-24: 10:00:00 via INTRAVENOUS
  Filled 2019-07-24: qty 1000

## 2019-07-24 MED ORDER — LIDOCAINE 2% (20 MG/ML) 5 ML SYRINGE
INTRAMUSCULAR | Status: DC | PRN
  Administered 2019-07-24: 60 mg via INTRAVENOUS

## 2019-07-24 MED ORDER — OXYCODONE HCL 5 MG/5ML PO SOLN
5.0000 mg | Freq: Once | ORAL | Status: DC | PRN
  Filled 2019-07-24: qty 5

## 2019-07-24 MED ORDER — CEFAZOLIN SODIUM-DEXTROSE 2-3 GM-%(50ML) IV SOLR
INTRAVENOUS | Status: DC | PRN
  Administered 2019-07-24: 2 g via INTRAVENOUS

## 2019-07-24 MED ORDER — METOCLOPRAMIDE HCL 5 MG/ML IJ SOLN
INTRAMUSCULAR | Status: DC | PRN
  Administered 2019-07-24: 10 mg via INTRAVENOUS

## 2019-07-24 MED ORDER — MIDAZOLAM HCL 2 MG/2ML IJ SOLN
INTRAMUSCULAR | Status: DC | PRN
  Administered 2019-07-24: 1 mg via INTRAVENOUS

## 2019-07-24 MED ORDER — BACITRACIN-NEOMYCIN-POLYMYXIN 400-5-5000 EX OINT
TOPICAL_OINTMENT | CUTANEOUS | Status: DC | PRN
  Administered 2019-07-24: 1 via TOPICAL

## 2019-07-24 MED ORDER — LIDOCAINE 2% (20 MG/ML) 5 ML SYRINGE
INTRAMUSCULAR | Status: AC
  Filled 2019-07-24: qty 5

## 2019-07-24 MED ORDER — MEPERIDINE HCL 25 MG/ML IJ SOLN
6.2500 mg | INTRAMUSCULAR | Status: DC | PRN
  Filled 2019-07-24: qty 1

## 2019-07-24 MED ORDER — PROPOFOL 500 MG/50ML IV EMUL
INTRAVENOUS | Status: AC
  Filled 2019-07-24: qty 50

## 2019-07-24 SURGICAL SUPPLY — 58 items
ADH SKN CLS APL DERMABOND .7 (GAUZE/BANDAGES/DRESSINGS)
BAG URINE DRAINAGE (UROLOGICAL SUPPLIES) ×1 IMPLANT
BLADE CLIPPER SENSICLIP SURGIC (BLADE) IMPLANT
BLADE SURG 10 STRL SS (BLADE) IMPLANT
BLADE SURG 15 STRL LF DISP TIS (BLADE) IMPLANT
BLADE SURG 15 STRL SS (BLADE) ×2
CATH FOLEY 2WAY SLVR  5CC 16FR (CATHETERS) ×1
CATH FOLEY 2WAY SLVR 5CC 16FR (CATHETERS) ×1 IMPLANT
CLOTH BEACON ORANGE TIMEOUT ST (SAFETY) ×1 IMPLANT
COVER BACK TABLE 60X90IN (DRAPES) ×2 IMPLANT
COVER MAYO STAND STRL (DRAPES) ×1 IMPLANT
COVER WAND RF STERILE (DRAPES) ×3 IMPLANT
DERMABOND ADVANCED (GAUZE/BANDAGES/DRESSINGS)
DERMABOND ADVANCED .7 DNX12 (GAUZE/BANDAGES/DRESSINGS) IMPLANT
DRAPE HYSTEROSCOPY (DRAPE) ×1 IMPLANT
DRAPE SHEET LG 3/4 BI-LAMINATE (DRAPES) ×2 IMPLANT
DRAPE UNDERBUTTOCKS STRL (DISPOSABLE) ×1 IMPLANT
ELECT REM PT RETURN 9FT ADLT (ELECTROSURGICAL) ×2
ELECTRODE REM PT RTRN 9FT ADLT (ELECTROSURGICAL) ×1 IMPLANT
GAUZE PACKING IODOFORM 2 (PACKING) ×1 IMPLANT
GAUZE SPONGE 4X4 16PLY XRAY LF (GAUZE/BANDAGES/DRESSINGS) ×1 IMPLANT
GLOVE BIO SURGEON STRL SZ8 (GLOVE) ×2 IMPLANT
GOWN STRL REUS W/ TWL LRG LVL3 (GOWN DISPOSABLE) ×1 IMPLANT
GOWN STRL REUS W/ TWL XL LVL3 (GOWN DISPOSABLE) ×1 IMPLANT
GOWN STRL REUS W/TWL LRG LVL3 (GOWN DISPOSABLE) ×2
GOWN STRL REUS W/TWL XL LVL3 (GOWN DISPOSABLE) ×2
IV NS IRRIG 3000ML ARTHROMATIC (IV SOLUTION) IMPLANT
KIT TURNOVER CYSTO (KITS) ×2 IMPLANT
LEGGING LITHOTOMY PAIR STRL (DRAPES) ×1 IMPLANT
MANIFOLD NEPTUNE II (INSTRUMENTS) IMPLANT
NDL SPNL 22GX5 LNG QUINC BK (NEEDLE) IMPLANT
NEEDLE HYPO 22GX1.5 SAFETY (NEEDLE) IMPLANT
NEEDLE SPNL 22GX5 LNG QUINC BK (NEEDLE) ×2 IMPLANT
NS IRRIG 500ML POUR BTL (IV SOLUTION) ×1 IMPLANT
PACK BASIN DAY SURGERY FS (CUSTOM PROCEDURE TRAY) ×2 IMPLANT
PAD PREP 24X48 CUFFED NSTRL (MISCELLANEOUS) ×1 IMPLANT
PENCIL BUTTON HOLSTER BLD 10FT (ELECTRODE) ×2 IMPLANT
PLUG CATH AND CAP STER (CATHETERS) IMPLANT
RETRACTOR LONRSTAR 16.6X16.6CM (MISCELLANEOUS) IMPLANT
RETRACTOR STAY HOOK 5MM (MISCELLANEOUS) IMPLANT
RETRACTOR STER APS 16.6X16.6CM (MISCELLANEOUS)
SET IRRIG Y TYPE TUR BLADDER L (SET/KITS/TRAYS/PACK) ×1 IMPLANT
SHEET LAVH (DRAPES) IMPLANT
SUT CHROMIC 4 0 RB 1X27 (SUTURE) ×1 IMPLANT
SUT ETHIBOND 0 (SUTURE) IMPLANT
SUT VIC AB 2-0 SH 27 (SUTURE)
SUT VIC AB 2-0 SH 27XBRD (SUTURE) ×1 IMPLANT
SUT VIC AB 2-0 UR6 27 (SUTURE) ×1 IMPLANT
SUT VIC AB 3-0 SH 27 (SUTURE)
SUT VIC AB 3-0 SH 27X BRD (SUTURE) ×1 IMPLANT
SYR 10ML LL (SYRINGE) ×1 IMPLANT
SYR BULB IRRIGATION 50ML (SYRINGE) ×1 IMPLANT
SYR CONTROL 10ML LL (SYRINGE) ×1 IMPLANT
TRAY DSU PREP LF (CUSTOM PROCEDURE TRAY) ×2 IMPLANT
TUBE CONNECTING 12X1/4 (SUCTIONS) ×3 IMPLANT
WATER STERILE IRR 3000ML UROMA (IV SOLUTION) IMPLANT
WATER STERILE IRR 500ML POUR (IV SOLUTION) ×1 IMPLANT
YANKAUER SUCT BULB TIP NO VENT (SUCTIONS) ×2 IMPLANT

## 2019-07-24 NOTE — Anesthesia Postprocedure Evaluation (Signed)
Anesthesia Post Note  Patient: Amy Vang  Procedure(s) Performed: EXCISION OF VAGINAL FOREIGN BODY (N/A Vagina )     Patient location during evaluation: PACU Anesthesia Type: General Level of consciousness: awake and alert Pain management: pain level controlled Vital Signs Assessment: post-procedure vital signs reviewed and stable Respiratory status: spontaneous breathing, nonlabored ventilation, respiratory function stable and patient connected to nasal cannula oxygen Cardiovascular status: blood pressure returned to baseline and stable Postop Assessment: no apparent nausea or vomiting Anesthetic complications: no    Last Vitals:  Vitals:   07/24/19 1300 07/24/19 1315  BP: 131/61 137/71  Pulse: 64 62  Resp: 13 11  Temp:    SpO2: 99% 93%    Last Pain:  Vitals:   07/24/19 1300  TempSrc:   PainSc: 0-No pain                 Leeanne Butters

## 2019-07-24 NOTE — Anesthesia Preprocedure Evaluation (Signed)
Anesthesia Evaluation  Patient identified by MRN, date of birth, ID band Patient awake    Reviewed: Allergy & Precautions, NPO status , Patient's Chart, lab work & pertinent test results  History of Anesthesia Complications (+) PONV and history of anesthetic complications  Airway Mallampati: III  TM Distance: >3 FB Neck ROM: Full    Dental  (+) Teeth Intact, Dental Advisory Given   Pulmonary asthma , sleep apnea and Continuous Positive Airway Pressure Ventilation ,    Pulmonary exam normal        Cardiovascular hypertension, Pt. on medications  Rhythm:Regular Rate:Normal  10/2016 ECHO: Normal LV size with mild LV hypertrophy. EF 60-65%. Mildly  dilated RV with normal systolic function. No significant valvular  abnormalities.   Neuro/Psych TIA   GI/Hepatic Neg liver ROS, hiatal hernia, GERD  Medicated,  Endo/Other  Morbid obesity  Renal/GU negative Renal ROS     Musculoskeletal  (+) Arthritis ,   Abdominal   Peds  Hematology negative hematology ROS (+)   Anesthesia Other Findings   Reproductive/Obstetrics                            Lab Results  Component Value Date   WBC 6.4 10/18/2017   HGB 12.4 10/18/2017   HCT 36.5 10/18/2017   MCV 90.3 10/18/2017   PLT 203 10/18/2017   Lab Results  Component Value Date   CREATININE 0.68 10/18/2017   BUN 9 10/18/2017   NA 138 10/18/2017   K 3.6 10/18/2017   CL 103 10/18/2017   CO2 29 10/18/2017    Anesthesia Physical  Anesthesia Plan  ASA: III  Anesthesia Plan: General   Post-op Pain Management:    Induction: Intravenous  PONV Risk Score and Plan: 4 or greater and Dexamethasone, Ondansetron, Treatment may vary due to age or medical condition, Metaclopromide, Midazolam and TIVA  Airway Management Planned: Oral ETT and LMA  Additional Equipment:   Intra-op Plan:   Post-operative Plan: Extubation in OR  Informed Consent: I  have reviewed the patients History and Physical, chart, labs and discussed the procedure including the risks, benefits and alternatives for the proposed anesthesia with the patient or authorized representative who has indicated his/her understanding and acceptance.     Dental advisory given  Plan Discussed with: CRNA, Anesthesiologist and Surgeon  Anesthesia Plan Comments:        Anesthesia Quick Evaluation

## 2019-07-24 NOTE — Transfer of Care (Signed)
  Last Vitals:  Vitals Value Taken Time  BP 122/60 07/24/19 1245  Temp 36.7 C 07/24/19 1235  Pulse 65 07/24/19 1246  Resp 10 07/24/19 1246  SpO2 91 % 07/24/19 1246  Vitals shown include unvalidated device data.  Last Pain:  Vitals:   07/24/19 1235  TempSrc:   PainSc: 0-No pain      Patients Stated Pain Goal: 4 (07/24/19 0850)  Immediate Anesthesia Transfer of Care Note  Patient: Amy Vang  Procedure(s) Performed: Procedure(s) (LRB): EXCISION OF VAGINAL FOREIGN BODY (N/A)  Patient Location: PACU  Anesthesia Type: General  Level of Consciousness: awake, alert  and oriented  Airway & Oxygen Therapy: Patient Spontanous Breathing and Patient connected to nasal cannula oxygen  Post-op Assessment: Report given to PACU RN and Post -op Vital signs reviewed and stable  Post vital signs: Reviewed and stable  Complications: No apparent anesthesia complications

## 2019-07-24 NOTE — Anesthesia Procedure Notes (Signed)
Procedure Name: LMA Insertion Date/Time: 07/24/2019 12:04 PM Performed by: Janeece Riggers, MD Pre-anesthesia Checklist: Patient identified, Emergency Drugs available, Suction available and Patient being monitored Patient Re-evaluated:Patient Re-evaluated prior to induction Oxygen Delivery Method: Circle system utilized Preoxygenation: Pre-oxygenation with 100% oxygen Induction Type: IV induction Ventilation: Mask ventilation without difficulty LMA: LMA inserted LMA Size: 4.0 Number of attempts: 1 Airway Equipment and Method: Bite block Placement Confirmation: positive ETCO2 Tube secured with: Tape Dental Injury: Teeth and Oropharynx as per pre-operative assessment

## 2019-07-24 NOTE — Anesthesia Procedure Notes (Deleted)
Performed by: Oddono, Ernest, MD       

## 2019-07-24 NOTE — Op Note (Signed)
   PATIENT:  Amy Vang  PRE-OPERATIVE DIAGNOSIS: Vaginal foreign body  POST-OPERATIVE DIAGNOSIS: Same  PROCEDURE: Removal of vaginal wall foreign body  SURGEON:  Surgeon: Claybon Jabs  INDICATION: Mrs. Khouri is a 73 year old female who had been experiencing some slight blood spotting per vagina for about 6 months.  She described it as dark brown at times and at other times bright red.  It was not associated with any vaginal pain.  No blood in the urine.  She has no voiding symptoms.  She underwent a previous mid urethral sling and cystocele repair several years ago and on examination was found to have a green suture protruding from the anterior wall of the vagina near the posterior aspect.  There is no evidence of infection.  She is brought to the operating room today for removal of this suture foreign body.  ANESTHESIA:  General  EBL: Minimal  DRAINS: None   DESCRIPTION OF PROCEDURE: After informed consent the patient was brought to the major OR and placed on the table. She was administered general anesthesia and then moved to the dorsal lithotomy position. Her lower abdomen and genitalia including vagina was sterilely prepped and draped. An official timeout was then performed.   A weighted speculum was then placed in the vagina and I placed the patient in slight Trendelenburg.  I was able to identify the suture.  I placed a hemostat on the 2 ends and cut in the middle.  I then applied traction to the suture on the left side and cut it flush with the vaginal mucosa.  The suture on the right-hand side then pulled out with no resistance.  There is no evidence of infection.  I injected approximately 2 cc of 1/2 percent Marcaine with epinephrine in the area and placed to rolled gauze sponges coated with antibiotic ointment in the vagina.  The patient was awakened and taken to the recovery room in stable and satisfactory condition.  She tolerated procedure well with no intraoperative  complications.    PLAN OF CARE: Discharge to home after PACU  PATIENT DISPOSITION:  PACU - hemodynamically stable.

## 2019-07-25 ENCOUNTER — Encounter (HOSPITAL_BASED_OUTPATIENT_CLINIC_OR_DEPARTMENT_OTHER): Payer: Self-pay | Admitting: Urology

## 2019-08-25 ENCOUNTER — Other Ambulatory Visit: Payer: Self-pay

## 2019-08-25 DIAGNOSIS — Z20822 Contact with and (suspected) exposure to covid-19: Secondary | ICD-10-CM

## 2019-08-26 LAB — NOVEL CORONAVIRUS, NAA: SARS-CoV-2, NAA: NOT DETECTED

## 2019-08-28 ENCOUNTER — Ambulatory Visit: Payer: Medicare Other

## 2019-08-31 DIAGNOSIS — U071 COVID-19: Secondary | ICD-10-CM

## 2019-08-31 HISTORY — DX: COVID-19: U07.1

## 2019-09-05 ENCOUNTER — Other Ambulatory Visit: Payer: Self-pay

## 2019-09-05 DIAGNOSIS — Z20822 Contact with and (suspected) exposure to covid-19: Secondary | ICD-10-CM

## 2019-09-07 LAB — NOVEL CORONAVIRUS, NAA: SARS-CoV-2, NAA: NOT DETECTED

## 2019-09-13 ENCOUNTER — Other Ambulatory Visit: Payer: Self-pay

## 2019-09-13 DIAGNOSIS — Z20822 Contact with and (suspected) exposure to covid-19: Secondary | ICD-10-CM

## 2019-09-16 LAB — NOVEL CORONAVIRUS, NAA: SARS-CoV-2, NAA: NOT DETECTED

## 2019-09-29 ENCOUNTER — Other Ambulatory Visit: Payer: Self-pay | Admitting: *Deleted

## 2019-09-29 DIAGNOSIS — Z20822 Contact with and (suspected) exposure to covid-19: Secondary | ICD-10-CM

## 2019-09-30 LAB — NOVEL CORONAVIRUS, NAA: SARS-CoV-2, NAA: DETECTED — AB

## 2019-10-10 ENCOUNTER — Ambulatory Visit: Payer: Medicare Other

## 2019-10-19 ENCOUNTER — Other Ambulatory Visit: Payer: Self-pay

## 2019-10-19 ENCOUNTER — Ambulatory Visit
Admission: RE | Admit: 2019-10-19 | Discharge: 2019-10-19 | Disposition: A | Discharge status: Home / Self Care | Payer: Medicare Other | Source: Ambulatory Visit | Attending: Family Medicine | Admitting: Family Medicine

## 2019-10-19 DIAGNOSIS — Z1231 Encounter for screening mammogram for malignant neoplasm of breast: Secondary | ICD-10-CM

## 2019-12-04 ENCOUNTER — Ambulatory Visit: Payer: Medicare Other

## 2019-12-05 ENCOUNTER — Ambulatory Visit: Payer: Medicare Other

## 2019-12-11 ENCOUNTER — Other Ambulatory Visit: Payer: Self-pay | Admitting: Gastroenterology

## 2019-12-11 DIAGNOSIS — R110 Nausea: Secondary | ICD-10-CM

## 2019-12-11 DIAGNOSIS — R1011 Right upper quadrant pain: Secondary | ICD-10-CM

## 2019-12-14 ENCOUNTER — Ambulatory Visit
Admission: RE | Admit: 2019-12-14 | Discharge: 2019-12-14 | Disposition: A | Discharge status: Home / Self Care | Payer: Medicare Other | Source: Ambulatory Visit | Attending: Gastroenterology | Admitting: Gastroenterology

## 2019-12-14 ENCOUNTER — Other Ambulatory Visit: Payer: Self-pay

## 2019-12-14 DIAGNOSIS — R1011 Right upper quadrant pain: Secondary | ICD-10-CM

## 2019-12-14 DIAGNOSIS — R110 Nausea: Secondary | ICD-10-CM

## 2019-12-23 ENCOUNTER — Ambulatory Visit: Payer: Medicare Other | Attending: Internal Medicine

## 2019-12-23 DIAGNOSIS — Z23 Encounter for immunization: Secondary | ICD-10-CM

## 2019-12-23 NOTE — Progress Notes (Signed)
   Covid-19 Vaccination Clinic  Name:  Amy Vang    MRN: [id number] DOB: 09-12-1946  12/23/2019  Amy Vang was observed post Covid-19 immunization for 15 minutes without incidence. She was provided with Vaccine Information Sheet and instruction to access the V-Safe system.   Amy Vang was instructed to call 911 with any severe reactions post vaccine: Marland Kitchen Difficulty breathing  . Swelling of your face and throat  . A fast heartbeat  . A bad rash all over your body  . Dizziness and weakness    Immunizations Administered    Name Date Dose VIS Date Route   Pfizer COVID-19 Vaccine 12/23/2019  1:36 PM 0.3 mL 11/10/2019 Intramuscular   Manufacturer: [employer]   Lot: [id number]   [place]: [id number]

## 2019-12-28 DIAGNOSIS — M5440 Lumbago with sciatica, unspecified side: Secondary | ICD-10-CM | POA: Insufficient documentation

## 2020-01-13 ENCOUNTER — Ambulatory Visit: Payer: Medicare Other | Attending: Internal Medicine

## 2020-01-13 DIAGNOSIS — Z23 Encounter for immunization: Secondary | ICD-10-CM

## 2020-01-13 NOTE — Progress Notes (Signed)
   Covid-19 Vaccination Clinic  Name:  Amy Vang    MRN: [id number] DOB: Sep 20, 1946  01/13/2020  Amy Vang was observed post Covid-19 immunization for 15 minutes without incidence. She was provided with Vaccine Information Sheet and instruction to access the V-Safe system.   Amy Vang was instructed to call 911 with any severe reactions post vaccine: Marland Kitchen Difficulty breathing  . Swelling of your face and throat  . A fast heartbeat  . A bad rash all over your body  . Dizziness and weakness    Immunizations Administered    Name Date Dose VIS Date Route   Pfizer COVID-19 Vaccine 01/13/2020 11:41 AM 0.3 mL 11/10/2019 Intramuscular   Manufacturer: [place]   Lot: [id number]   [place]: [id number]

## 2020-01-24 DIAGNOSIS — M5136 Other intervertebral disc degeneration, lumbar region: Secondary | ICD-10-CM | POA: Insufficient documentation

## 2020-01-24 DIAGNOSIS — M51369 Other intervertebral disc degeneration, lumbar region without mention of lumbar back pain or lower extremity pain: Secondary | ICD-10-CM | POA: Insufficient documentation

## 2020-02-20 ENCOUNTER — Other Ambulatory Visit: Payer: Self-pay

## 2020-02-20 ENCOUNTER — Encounter: Payer: Self-pay | Admitting: Ophthalmology

## 2020-02-23 ENCOUNTER — Other Ambulatory Visit
Admission: RE | Admit: 2020-02-23 | Discharge: 2020-02-23 | Disposition: A | Discharge status: Home / Self Care | Payer: Medicare Other | Source: Ambulatory Visit | Attending: Ophthalmology | Admitting: Ophthalmology

## 2020-02-23 DIAGNOSIS — Z01812 Encounter for preprocedural laboratory examination: Secondary | ICD-10-CM | POA: Insufficient documentation

## 2020-02-23 DIAGNOSIS — Z20822 Contact with and (suspected) exposure to covid-19: Secondary | ICD-10-CM | POA: Insufficient documentation

## 2020-02-24 LAB — SARS CORONAVIRUS 2 (TAT 6-24 HRS): SARS Coronavirus 2: NEGATIVE

## 2020-02-26 NOTE — Anesthesia Preprocedure Evaluation (Addendum)
Anesthesia Evaluation  Patient identified by MRN, date of birth, ID band Patient awake    Reviewed: Allergy & Precautions, NPO status , Patient's Chart, lab work & pertinent test results  History of Anesthesia Complications (+) PONV and history of anesthetic complications  Airway Mallampati: III  TM Distance: >3 FB Neck ROM: Full    Dental   Pulmonary asthma , sleep apnea and Continuous Positive Airway Pressure Ventilation ,   Pulmonary HTN COVID+ 09/29/19   breath sounds clear to auscultation       Cardiovascular hypertension, (-) angina(-) DOE  Rhythm:Regular Rate:Normal   TTE (03/2019): LVEF 60-65% Mild LVH LV diastolic dysfunction Unable to estimate RVSP  Mild AI, mild MR  RHC (2017): PA 34/16, mean 22   Neuro/Psych PSYCHIATRIC DISORDERS Anxiety  Neuromuscular disease (Cervical spondylosis)    GI/Hepatic hiatal hernia, GERD  Controlled and Medicated,  Endo/Other    Renal/GU      Musculoskeletal  (+) Arthritis ,   Abdominal   Peds  Hematology   Anesthesia Other Findings   Reproductive/Obstetrics                            Anesthesia Physical Anesthesia Plan  ASA: III  Anesthesia Plan: MAC   Post-op Pain Management:    Induction: Intravenous  PONV Risk Score and Plan: 3 and TIVA, Midazolam and Treatment may vary due to age or medical condition  Airway Management Planned: Nasal Cannula  Additional Equipment:   Intra-op Plan:   Post-operative Plan:   Informed Consent: I have reviewed the patients History and Physical, chart, labs and discussed the procedure including the risks, benefits and alternatives for the proposed anesthesia with the patient or authorized representative who has indicated his/her understanding and acceptance.       Plan Discussed with: CRNA and Anesthesiologist  Anesthesia Plan Comments:         Anesthesia Quick Evaluation

## 2020-02-26 NOTE — Discharge Instructions (Signed)

## 2020-02-27 ENCOUNTER — Ambulatory Visit
Admission: RE | Admit: 2020-02-27 | Discharge: 2020-02-27 | Disposition: A | Discharge status: Home / Self Care | Payer: Medicare Other | Attending: Ophthalmology | Admitting: Ophthalmology

## 2020-02-27 ENCOUNTER — Ambulatory Visit: Payer: Medicare Other | Admitting: Anesthesiology

## 2020-02-27 ENCOUNTER — Encounter
Admission: RE | Disposition: A | Discharge status: Home / Self Care | Payer: Self-pay | Source: Home / Self Care | Attending: Ophthalmology

## 2020-02-27 ENCOUNTER — Encounter: Payer: Self-pay | Admitting: Ophthalmology

## 2020-02-27 ENCOUNTER — Other Ambulatory Visit: Payer: Self-pay

## 2020-02-27 DIAGNOSIS — G4730 Sleep apnea, unspecified: Secondary | ICD-10-CM | POA: Diagnosis not present

## 2020-02-27 DIAGNOSIS — Z79899 Other long term (current) drug therapy: Secondary | ICD-10-CM | POA: Insufficient documentation

## 2020-02-27 DIAGNOSIS — I10 Essential (primary) hypertension: Secondary | ICD-10-CM | POA: Insufficient documentation

## 2020-02-27 DIAGNOSIS — I272 Pulmonary hypertension, unspecified: Secondary | ICD-10-CM | POA: Diagnosis not present

## 2020-02-27 DIAGNOSIS — H2511 Age-related nuclear cataract, right eye: Secondary | ICD-10-CM | POA: Insufficient documentation

## 2020-02-27 DIAGNOSIS — F419 Anxiety disorder, unspecified: Secondary | ICD-10-CM | POA: Insufficient documentation

## 2020-02-27 DIAGNOSIS — K219 Gastro-esophageal reflux disease without esophagitis: Secondary | ICD-10-CM | POA: Diagnosis not present

## 2020-02-27 DIAGNOSIS — Z8616 Personal history of COVID-19: Secondary | ICD-10-CM | POA: Insufficient documentation

## 2020-02-27 HISTORY — PX: CATARACT EXTRACTION W/PHACO: SHX586

## 2020-02-27 HISTORY — DX: Motion sickness, initial encounter: T75.3XXA

## 2020-02-27 SURGERY — PHACOEMULSIFICATION, CATARACT, WITH IOL INSERTION
Anesthesia: Monitor Anesthesia Care | Site: Eye | Laterality: Right

## 2020-02-27 MED ORDER — LACTATED RINGERS IV SOLN: 100.0000 mL/h | INTRAVENOUS | Status: DC

## 2020-02-27 MED ORDER — ARMC OPHTHALMIC DILATING DROPS
1.0000 "application " | OPHTHALMIC | Status: DC | PRN
  Administered 2020-02-27 (×3): 1 via OPHTHALMIC

## 2020-02-27 MED ORDER — ONDANSETRON HCL 4 MG/2ML IJ SOLN: 4.0000 mg | Freq: Once | INTRAMUSCULAR | Status: DC | PRN

## 2020-02-27 MED ORDER — ONDANSETRON HCL 4 MG/2ML IJ SOLN
INTRAMUSCULAR | Status: DC | PRN
  Administered 2020-02-27: 4 mg via INTRAVENOUS

## 2020-02-27 MED ORDER — FENTANYL CITRATE (PF) 100 MCG/2ML IJ SOLN
INTRAMUSCULAR | Status: DC | PRN
  Administered 2020-02-27: 50 ug via INTRAVENOUS

## 2020-02-27 MED ORDER — BRIMONIDINE TARTRATE-TIMOLOL 0.2-0.5 % OP SOLN
OPHTHALMIC | Status: DC | PRN
  Administered 2020-02-27: 1 [drp] via OPHTHALMIC

## 2020-02-27 MED ORDER — MIDAZOLAM HCL 2 MG/2ML IJ SOLN
INTRAMUSCULAR | Status: DC | PRN
  Administered 2020-02-27: 2 mg via INTRAVENOUS

## 2020-02-27 MED ORDER — LIDOCAINE HCL (PF) 2 % IJ SOLN
INTRAOCULAR | Status: DC | PRN
  Administered 2020-02-27: 2 mL

## 2020-02-27 MED ORDER — TETRACAINE HCL 0.5 % OP SOLN
1.0000 [drp] | OPHTHALMIC | Status: DC | PRN
  Administered 2020-02-27 (×3): 1 [drp] via OPHTHALMIC

## 2020-02-27 MED ORDER — MOXIFLOXACIN HCL 0.5 % OP SOLN
OPHTHALMIC | Status: DC | PRN
  Administered 2020-02-27: 0.2 mL via OPHTHALMIC

## 2020-02-27 MED ORDER — NA CHONDROIT SULF-NA HYALURON 40-17 MG/ML IO SOLN
INTRAOCULAR | Status: DC | PRN
  Administered 2020-02-27: 1 mL via INTRAOCULAR

## 2020-02-27 MED ORDER — EPINEPHRINE PF 1 MG/ML IJ SOLN
INTRAOCULAR | Status: DC | PRN
  Administered 2020-02-27: 11:00:00 53 mL via OPHTHALMIC

## 2020-02-27 MED ORDER — ACETAMINOPHEN 10 MG/ML IV SOLN: 1000.0000 mg | Freq: Once | INTRAVENOUS | Status: DC | PRN

## 2020-02-27 SURGICAL SUPPLY — 21 items
CANNULA ANT/CHMB 27G (MISCELLANEOUS) ×2 IMPLANT
CANNULA ANT/CHMB 27GA (MISCELLANEOUS) ×4 IMPLANT
DISSECTOR HYDRO NUCLEUS 50X22 (MISCELLANEOUS) ×2 IMPLANT
GLOVE SURG LX 8.0 MICRO (GLOVE) ×1
GLOVE SURG LX STRL 8.0 MICRO (GLOVE) ×1 IMPLANT
GLOVE SURG TRIUMPH 8.0 PF LTX (GLOVE) ×2 IMPLANT
GOWN STRL REUS W/ TWL LRG LVL3 (GOWN DISPOSABLE) ×2 IMPLANT
GOWN STRL REUS W/TWL LRG LVL3 (GOWN DISPOSABLE) ×4
LENS IOL DIOP 20.0 (Intraocular Lens) ×2 IMPLANT
LENS IOL TECNIS MONO 20.0 (Intraocular Lens) IMPLANT
MARKER SKIN DUAL TIP RULER LAB (MISCELLANEOUS) ×2 IMPLANT
NDL FILTER BLUNT 18X1 1/2 (NEEDLE) ×1 IMPLANT
NEEDLE FILTER BLUNT 18X 1/2SAF (NEEDLE) ×1
NEEDLE FILTER BLUNT 18X1 1/2 (NEEDLE) ×1 IMPLANT
PACK EYE AFTER SURG (MISCELLANEOUS) ×2 IMPLANT
PACK OPTHALMIC (MISCELLANEOUS) ×2 IMPLANT
PACK PORFILIO (MISCELLANEOUS) ×2 IMPLANT
SYR 3ML LL SCALE MARK (SYRINGE) ×2 IMPLANT
SYR TB 1ML LUER SLIP (SYRINGE) ×2 IMPLANT
WATER STERILE IRR 250ML POUR (IV SOLUTION) ×2 IMPLANT
WIPE NON LINTING 3.25X3.25 (MISCELLANEOUS) ×2 IMPLANT

## 2020-02-27 NOTE — Op Note (Signed)
PREOPERATIVE DIAGNOSIS:  Nuclear sclerotic cataract of the right eye.   POSTOPERATIVE DIAGNOSIS:  H25.11 Cataract   OPERATIVE PROCEDURE:@   SURGEON:  Birder Robson, MD.   ANESTHESIA:  Anesthesiologist: Heniser, Fredric Dine, MD CRNA: Silvana Newness, CRNA  1.      Managed anesthesia care. 2.      0.36ml of Shugarcaine was instilled in the eye following the paracentesis.   COMPLICATIONS:  None.   TECHNIQUE:   Stop and chop   DESCRIPTION OF PROCEDURE:  The patient was examined and consented in the preoperative holding area where the aforementioned topical anesthesia was applied to the right eye and then brought back to the Operating Room where the right eye was prepped and draped in the usual sterile ophthalmic fashion and a lid speculum was placed. A paracentesis was created with the side port blade and the anterior chamber was filled with viscoelastic. A near clear corneal incision was performed with the steel keratome. A continuous curvilinear capsulorrhexis was performed with a cystotome followed by the capsulorrhexis forceps. Hydrodissection and hydrodelineation were carried out with BSS on a blunt cannula. The lens was removed in a stop and chop  technique and the remaining cortical material was removed with the irrigation-aspiration handpiece. The capsular bag was inflated with viscoelastic and the Technis [id number]  lens was placed in the capsular bag without complication. The remaining viscoelastic was removed from the eye with the irrigation-aspiration handpiece. The wounds were hydrated. The anterior chamber was flushed with BSS and the eye was inflated to physiologic pressure. 0.28ml of Vigamox was placed in the anterior chamber. The wounds were found to be water tight. The eye was dressed with Combigan. The patient was given protective glasses to wear throughout the day and a shield with which to sleep tonight. The patient was also given drops with which to begin a drop regimen today and will  follow-up with me in one day. Implant Name Type Inv. Item Serial No. Manufacturer Lot No. LRB No. Used Action  LENS IOL DIOP 20.0 - [id number] Intraocular Lens LENS IOL DIOP 20.0 [id number]  Right 1 Implanted   Procedure(s) with comments: CATARACT EXTRACTION PHACO AND INTRAOCULAR LENS PLACEMENT (IOC) RIGHT 3.60 00:23.9 (Right) - sleep apnea  Electronically signed: Birder Robson 02/27/2020 10:39 AM

## 2020-02-27 NOTE — H&P (Signed)
All labs reviewed. Abnormal studies sent to patients PCP when indicated.  Previous H&P reviewed, patient examined, there are NO CHANGES.  Amy Breault Porfilio3/30/202110:10 AM

## 2020-02-27 NOTE — Transfer of Care (Signed)
Immediate Anesthesia Transfer of Care Note  Patient: Amy Vang  Procedure(s) Performed: CATARACT EXTRACTION PHACO AND INTRAOCULAR LENS PLACEMENT (IOC) RIGHT 3.60 00:23.9 (Right Eye)  Patient Location: PACU  Anesthesia Type: MAC  Level of Consciousness: awake, alert  and patient cooperative  Airway and Oxygen Therapy: Patient Spontanous Breathing and Patient connected to supplemental oxygen  Post-op Assessment: Post-op Vital signs reviewed, Patient's Cardiovascular Status Stable, Respiratory Function Stable, Patent Airway and No signs of Nausea or vomiting  Post-op Vital Signs: Reviewed and stable  Complications: No apparent anesthesia complications

## 2020-02-27 NOTE — Anesthesia Postprocedure Evaluation (Signed)
Anesthesia Post Note  Patient: Amy Vang  Procedure(s) Performed: CATARACT EXTRACTION PHACO AND INTRAOCULAR LENS PLACEMENT (IOC) RIGHT 3.60 00:23.9 (Right Eye)     Patient location during evaluation: PACU Anesthesia Type: MAC Level of consciousness: awake and alert Pain management: pain level controlled Vital Signs Assessment: post-procedure vital signs reviewed and stable Respiratory status: spontaneous breathing, nonlabored ventilation, respiratory function stable and patient connected to nasal cannula oxygen Cardiovascular status: stable and blood pressure returned to baseline Postop Assessment: no apparent nausea or vomiting Anesthetic complications: no    Abilene Mcphee A  Javar Eshbach

## 2020-02-28 ENCOUNTER — Encounter: Payer: Self-pay | Admitting: *Deleted

## 2020-03-07 ENCOUNTER — Other Ambulatory Visit: Payer: Self-pay

## 2020-03-11 ENCOUNTER — Other Ambulatory Visit: Payer: Self-pay

## 2020-03-11 ENCOUNTER — Encounter: Payer: Self-pay | Admitting: Internal Medicine

## 2020-03-11 ENCOUNTER — Ambulatory Visit (INDEPENDENT_AMBULATORY_CARE_PROVIDER_SITE_OTHER): Payer: Medicare Other | Admitting: Internal Medicine

## 2020-03-11 VITALS — BP 138/60 | HR 85 | Ht 65.0 in | Wt 178.0 lb

## 2020-03-11 DIAGNOSIS — E042 Nontoxic multinodular goiter: Secondary | ICD-10-CM

## 2020-03-11 NOTE — Progress Notes (Signed)
Patient ID: Amy Vang, female   DOB: 1946-03-21, 74 y.o.   MRN: [id number]   This visit occurred during the SARS-CoV-2 public health emergency.  Safety protocols were in place, including screening questions prior to the visit, additional usage of staff PPE, and extensive cleaning of exam room while observing appropriate contact time as indicated for disinfecting solutions.   HPI  Amy Vang is a 74 y.o.-year-old female, referred by Dr. Tamala Julian, for evaluation for thyroid nodules.   She was found to have a thyroid nodule on recent spine x-rays.  PCP ordered a thyroid ultrasound.  Records reviewed, but images not available:  Thyroid U/S (01/11/2020, [facility]): Normal-sized thyroid, mildly heterogeneous, with 4 thyroid nodules:  Right mid nodule measuring 1.3 x 1.3 x 1.1 cm, spongiform-no follow-up needed  Right inferior nodule measuring 1 x 0.9 x 0.8 cm, solid, hypoechoic-follow-up in 1 year  Isthmic nodule measuring 1.2 x 0.8 x 0.8 cm, solid, hypoechoic-follow-up in 1 year  Left inferior nodule 0.8 x 0.6 x 0.4 cm, partially cystic-no follow-up needed  Pt denies: - feeling nodules in neck  - hoarseness - dysphagia - choking - SOB with lying down + Occasional cough after eating -with dry foods-chronic.  Of note, the patient had a barium swallow (10/30/2011): Moderate esophageal dysmotility, likely presbyesophagus, small hiatal hernia, suspicion for GERD. Sees Dr. Watt Climes.  I reviewed pt's thyroid tests: 10/31/2019: TSH 1.32 09/08/2018: TSH 1.92 Lab Results  Component Value Date   TSH 1.46 04/22/2016   TSH 1.30 03/06/2015    Pt denies: - fatigue - heat intolerance/cold intolerance - tremors - palpitations - weight loss/weight gain - dry skin - hair loss  She has: - + anxiety/depression - on Celexa - + Nausea, hyperdefecation/constipation-occasionally  + FH of thyroid ds. - sister and niece.  No FH of thyroid cancer. No h/o radiation tx to head or neck.  No  steroid use. No herbal supplements. No Biotin supplements or Hair, Skin and Nails vitamins.  Pt also has a history of GERD - on Prptonix now, severe OSA- on CPAP, COVID-19 08/2019 >> still fatigued.  ROS: Constitutional: + See HPI, + nocturia, + poor sleep  Eyes: no blurry vision, no xerophthalmia ENT: no sore throat,  + see HPI Cardiovascular: no CP/SOB/palpitations/leg swelling Respiratory: no cough/SOB Gastrointestinal: no N/V/D/C Musculoskeletal: no muscle/joint aches Skin: no rashes Neurological: no tremors/numbness/tingling/dizziness Psychiatric: no depression/anxiety  Past Medical History:  Diagnosis Date  . Allergic rhinitis   . Anxiety   . Chronic constipation   . COVID-19 08/2019  . Degenerative disc disease, cervical   . Essential hypertension   . GERD (gastroesophageal reflux disease)   . Hiatal hernia   . History of echocardiogram    in epic 04-21-2019,  normal w/ ef 60-65% and mild mitral regurg, no stenosis or restriction  . History of TIAs    per pt age 70;  2008;  05/ 2013;  pt stated no residual was from sleep doctor more than likely sleep deprived than actual tia  . Motion sickness    cars  . Nocturia   . OSA on CPAP    Severe ,, CPAP 12 MM H2O- Dr Radford Pax  . PONV (postoperative nausea and vomiting)   . Restless leg syndrome   . Rosacea   . Seasonal asthma    no inhaler   Past Surgical History:  Procedure Laterality Date  . ANTERIOR CERVICAL DECOMP/DISCECTOMY FUSION N/A 10/25/2017   Procedure: CERVICAL SIX- CERVICAL SEVEN ANTERIOR CERVICAL DECOMPRESSION/DISCECTOMY FUSION,  INTERBODY PROSTHESIS AND ANTERIOR PLATING;  Surgeon: Newman Pies, MD;  Location: [facility];  Service: Neurosurgery;  Laterality: N/A;  CERVICAL 6- CERVICAL 7 ANTERIOR CERVICAL DECOMPRESSION/DISCECTOMY FUSION, INTERBODY PROSTHESIS AND ANTERIOR PLATING  . BILATERAL BENIGN BREAST BX'S  20 YRS AGO  . BREAST BIOPSY Right    benign  . BREAST EXCISIONAL BIOPSY Bilateral over 10 years ago    benign  . CARDIAC CATHETERIZATION N/A 08/13/2016   Procedure: Right Heart Cath;  Surgeon: Larey Dresser, MD;  Location: [facility];  Service: Cardiovascular;  Laterality: N/A;    normal study, no evidence pulm HTN  . CARPAL TUNNEL RELEASE  2009   BILATERAL  . CATARACT EXTRACTION W/PHACO Right 02/27/2020   Procedure: CATARACT EXTRACTION PHACO AND INTRAOCULAR LENS PLACEMENT (IOC) RIGHT 3.60 00:23.9;  Surgeon: Birder Robson, MD;  Location: [facility];  Service: Ophthalmology;  Laterality: Right;  sleep apnea  . CYSTOCELE REPAIR  12/11/2011   Procedure: ANTERIOR REPAIR (CYSTOCELE);  Surgeon: Claybon Jabs, MD;  Location: [facility];  Service: Urology;  Laterality: N/A;  1 1/2 hour requested for case  Anterior repair and mid Urethral Sling  . MASS EXCISION N/A 07/24/2019   Procedure: EXCISION OF VAGINAL FOREIGN BODY;  Surgeon: Kathie Rhodes, MD;  Location: [facility];  Service: Urology;  Laterality: N/A;  . NASAL SEPTOPLASTY W/ TURBINOPLASTY Bilateral 11/18/2016   Procedure: NASAL SEPTOPLASTY WITH BILATERAL TURBINATE REDUCTION;  Surgeon: Jerrell Belfast, MD;  Location: [facility];  Service: ENT;  Laterality: Bilateral;  . NONINVASIVE VASCULAR CAROTID STUDY  09-14-2007   BILATERAL MILD MIX PLAQUE THROUGHOUT, NO SIGNIFICANT BILATERAL ICA STENOSIS  . PUBOVAGINAL SLING  12/11/2011   Procedure: Gaynelle Arabian;  Surgeon: Claybon Jabs, MD;  Location: [facility];  Service: Urology;  Laterality: N/A;  . PULLEY RELEASE RIGHT THUMB  2009  . SHOULDER ARTHROSCOPY DISTAL CLAVICLE EXCISION AND OPEN ROTATOR CUFF REPAIR Right 01-17-2019   dr supple @[facility]   . SINUS ENDO WITH FUSION Bilateral 11/18/2016   Procedure: BILATERAL ENDOSCOPIC SINUS SURGERY;  Surgeon: Jerrell Belfast, MD;  Location: [facility];  Service: ENT;  Laterality: Bilateral;  . VAGINAL HYSTERECTOMY  AGE 55   Social History   Socioeconomic History  . Marital status: Married     Spouse name: Not on file  . Number of children: 2  . Years of education: Not on file  . Highest education level: Not on file  Occupational History  .  Retired Web designer  Tobacco Use  . Smoking status: Never Smoker  . Smokeless tobacco: Never Used  Substance and Sexual Activity  . Alcohol use: No  . Drug use: No  . Sexual activity: Not on file  Other Topics Concern  . Not on file  Social History Narrative   Lives with husband.    Retired Web designer   Has 2 children.   Social Determinants of Health   Financial Resource Strain:   . Difficulty of Paying Living Expenses:   Food Insecurity:   . Worried About Charity fundraiser in the Last Year:   . Arboriculturist in the Last Year:   Transportation Needs:   . Film/video editor (Medical):   Marland Kitchen Lack of Transportation (Non-Medical):   Physical Activity:   . Days of Exercise per Week:   . Minutes of Exercise per Session:   Stress:   . Feeling of Stress :   Social Connections:   . Frequency of Communication  with Friends and Family:   . Frequency of Social Gatherings with Friends and Family:   . Attends Religious Services:   . Active Member of Clubs or Organizations:   . Attends Archivist Meetings:   Marland Kitchen Marital Status:   Intimate Partner Violence:   . Fear of Current or Ex-Partner:   . Emotionally Abused:   Marland Kitchen Physically Abused:   . Sexually Abused:    Current Outpatient Medications on File Prior to Visit  Medication Sig Dispense Refill  . acetaminophen (TYLENOL) 325 MG tablet Take 650 mg by mouth every 6 (six) hours as needed.    Marland Kitchen azelastine (ASTELIN) 0.1 % nasal spray Place into both nostrils 2 (two) times daily. Use in each nostril as directed    . citalopram (CELEXA) 20 MG tablet 10 mg oral daily  2  . dexlansoprazole (DEXILANT) 60 MG capsule Take 60 mg by mouth daily.    . famotidine (PEPCID) 40 MG tablet Take 40 mg by mouth at bedtime.    . fluticasone (FLONASE) 50 MCG/ACT  nasal spray Place 2 sprays into both nostrils 2 (two) times daily as needed.     . hydrochlorothiazide (HYDRODIURIL) 25 MG tablet Take 1 tablet (25 mg total) by mouth daily.    Marland Kitchen levocetirizine (XYZAL) 5 MG tablet Take 5 mg by mouth every evening.    Marland Kitchen LORazepam (ATIVAN) 0.5 MG tablet Take 0.5 mg by mouth at bedtime.    . methocarbamol (ROBAXIN) 500 MG tablet Take 500 mg by mouth every 6 (six) hours as needed for muscle spasms.    . polyethylene glycol (MIRALAX / GLYCOLAX) packet Take 17 g by mouth daily as needed for mild constipation.     . potassium chloride (MICRO-K) 10 MEQ CR capsule Take 10 mEq by mouth 2 (two) times daily.     . Probiotic Product (ALIGN PO) Take 1 tablet by mouth daily.    . sodium chloride (OCEAN) 0.65 % SOLN nasal spray Place 1 spray into both nostrils 4 (four) times daily as needed for congestion.     . traZODone (DESYREL) 50 MG tablet Take 50 mg by mouth at bedtime.    . cyclobenzaprine (FLEXERIL) 10 MG tablet Take 1 tablet (10 mg total) by mouth 3 (three) times daily as needed for muscle spasms. (Patient not taking: Reported on 02/27/2020) 50 tablet 1   No current facility-administered medications on file prior to visit.   Allergies  Allergen Reactions  . Penicillins Hives, Rash and Other (See Comments)    PATIENT HAS HAD A PCN REACTION WITH IMMEDIATE RASH, FACIAL/TONGUE/THROAT SWELLING, SOB, OR LIGHTHEADEDNESS WITH HYPOTENSION:  #  #  #  YES  #  #  #   HAS PT DEVELOPED SEVERE RASH INVOLVING MUCUS MEMBRANES or SKIN NECROSIS: #  #  #  YES  #  #  #   Has patient had a PCN reaction that required hospitalization No Has patient had a PCN reaction occurring within the last 10 years: No If all of the above answers are "NO", then may proceed with Cephalosporin use.   Marland Kitchen Hydroxyzine Hcl     UNSPECIFIED REACTION   . Ciprofloxacin Anxiety  . Lactose Intolerance (Gi) Other (See Comments)    bloating  . Septra [Sulfamethoxazole-Trimethoprim] Rash   Family History   Adopted: Yes  Problem Relation Age of Onset  . Diabetes Mother   . Stroke Mother   . Alzheimer's disease Mother   . Hypertension Sister   . Diabetes  Sister   . Heart attack Brother   . Leukemia Brother   . CAD Brother   . Diabetes Brother   . Colon cancer Maternal Aunt   . Stroke Paternal Grandmother   . Heart attack Paternal Grandfather    PE: BP 138/60   Pulse 85   Ht 5\' 5"  (1.651 m)   Wt 178 lb (80.7 kg)   SpO2 96%   BMI 29.62 kg/m  Wt Readings from Last 3 Encounters:  03/11/20 178 lb (80.7 kg)  02/27/20 175 lb 3.2 oz (79.5 kg)  07/24/19 172 lb (78 kg)   Constitutional: overweight, in NAD Eyes: PERRLA, EOMI, no exophthalmos ENT: moist mucous membranes, no thyromegaly, no cervical lymphadenopathy Cardiovascular: RRR, No MRG Respiratory: CTA B Gastrointestinal: abdomen soft, NT, ND, BS+ Musculoskeletal: no deformities, strength intact in all 4;  Skin: moist, warm, no rashes Neurological: no tremor with outstretched hands, DTR normal in all 4  ASSESSMENT: 1. Thyroid nodules  PLAN: 1. Thyroid nodules - I reviewed the images of her thyroid ultrasound report along with the patient. I pointed out that the dominant nodules are small, but 2 nodules qualify for follow-up by another ultrasound in a year (the right inferior and isthmic nodule).  They do not qualify for immediate biopsy.  The other 2 nodules (the right middle and left inferior nodules) are small and not worrisome so no follow-up is needed for these.   -I reassured her that her thyroid nodules on low risk.  Discussed about low risk features: - not hypoechoic - without microcalcifications - without internal blood flow - more wide than tall - well delimited from surrounding tissue The nodules do not appear to have concerning features except for hypoechogenicity, which is is very common.  However, I would like to obtain the CD of her thyroid ultrasound reviewed the images myself.  She signed a release of  information form today. - Pt does not have a thyroid cancer family history or a personal history of RxTx to head/neck. All these would favor benignity.  - As of now, based on the ultrasound report, I would not suggest biopsy of any of the nodules, but we definitely need to repeat the ultrasound in a year. - we reviewed together her latest TSH, which was normal.  We will not repeat this today. - she has no neck compression symptoms, but I advised her that she should let me know if she develops these, in which case we need to repeat the ultrasound - I did explain that, while thyroid surgery is not a complicated one, it still can have side effects and also she might have a risk of ~25% of becoming hypothyroid after hemithyroidectomy.  -I will see the patient back in a year.  I advised her to call me approximately 3 weeks before the visit so I can order a new thyroid ultrasound that we could review the images when she comes back.  Philemon Kingdom, MD PhD [facility]

## 2020-03-11 NOTE — Patient Instructions (Signed)
Please come back in 1 year.  Let me know to order another U/S ~3 weeks before our next visit.   Thyroid Nodule  A thyroid nodule is an isolated growth of thyroid cells that forms a lump in your thyroid gland. The thyroid gland is a butterfly-shaped gland. It is found in the lower front of your neck. This gland sends chemical messengers (hormones) through your blood to all parts of your body. These hormones are important in regulating your body temperature and helping your body to use energy. Thyroid nodules are common. Most are not cancerous (benign). You may have one nodule or several nodules. Different types of thyroid nodules include nodules that:  Grow and fill with fluid (thyroid cysts).  Produce too much thyroid hormone (hot nodules or hyperthyroid).  Produce no thyroid hormone (cold nodules or hypothyroid).  Form from cancer cells (thyroid cancers). What are the causes? In most cases, the cause of this condition is not known. What increases the risk? The following factors may make you more likely to develop this condition.  Age. Thyroid nodules become more common in people who are older than 73 years of age.  Gender. ? Benign thyroid nodules are more common in women. ? Cancerous (malignant) thyroid nodules are more common in men.  A family history that includes: ? Thyroid nodules. ? Pheochromocytoma. ? Thyroid carcinoma. ? Hyperparathyroidism.  Certain kinds of thyroid diseases, such as Hashimoto's thyroiditis.  Lack of iodine in your diet.  A history of head and neck radiation, such as from previous cancer treatment. What are the signs or symptoms? In many cases, there are no symptoms. If you have symptoms, they may include:  A lump in your lower neck.  Feeling a lump or tickle in your throat.  Pain in your neck, jaw, or ear.  Having trouble swallowing. Hot nodules may cause symptoms that include:  Weight loss.  Warm, flushed skin.  Feeling  hot.  Feeling nervous.  A racing heartbeat. Cold nodules may cause symptoms that include:  Weight gain.  Dry skin.  Brittle hair. This may also occur with hair loss.  Feeling cold.  Fatigue. Thyroid cancer nodules may cause symptoms that include:  Hard nodules that feel stuck to the thyroid gland.  Hoarseness.  Lumps in the glands near your thyroid (lymph nodes). How is this diagnosed? A thyroid nodule may be felt by your health care provider during a physical exam. This condition may also be diagnosed based on your symptoms. You may also have tests, including:  An ultrasound. This may be done to confirm the diagnosis.  A biopsy. This involves taking a sample from the nodule and looking at it under a microscope.  Blood tests to make sure that your thyroid is working properly.  A thyroid scan. This test uses a radioactive tracer injected into a vein to create an image of the thyroid gland on a computer screen.  Imaging tests such as MRI or CT scan. These may be done if: ? Your nodule is large. ? Your nodule is blocking your airway. ? Cancer is suspected. How is this treated? Treatment depends on the cause and size of your nodule or nodules. If the nodule is benign, treatment may not be necessary. Your health care provider may monitor the nodule to see if it goes away without treatment. If the nodule continues to grow, is cancerous, or does not go away, treatment may be needed. Treatment may include:  Having a cystic nodule drained with a needle.  Ablation therapy. In this treatment, alcohol is injected into the area of the nodule to destroy the cells. Ablation with heat (thermal ablation) may also be used.  Radioactive iodine. In this treatment, radioactive iodine is given as a pill or liquid that you drink. This substance causes the thyroid nodule to shrink.  Surgery to remove the nodule. Part or all of your thyroid gland may need to be removed as  well.  Medicines. Follow these instructions at home:  Pay attention to any changes in your nodule.  Take over-the-counter and prescription medicines only as told by your health care provider.  Keep all follow-up visits as told by your health care provider. This is important. Contact a health care provider if:  Your voice changes.  You have trouble swallowing.  You have pain in your neck, ear, or jaw that is getting worse.  Your nodule gets bigger.  Your nodule starts to make it harder for you to breathe.  Your muscles look like they are shrinking (muscle wasting). Get help right away if:  You have chest pain.  There is a loss of consciousness.  You have a sudden fever.  You feel confused.  You are seeing or hearing things that other people do not see or hear (having hallucinations).  You feel very weak.  You have mood swings.  You feel very restless.  You feel suddenly nauseous or throw up.  You suddenly have diarrhea. Summary  A thyroid nodule is an isolated growth of thyroid cells that forms a lump in your thyroid gland.  Thyroid nodules are common. Most are not cancerous (benign). You may have one nodule or several nodules.  Treatment depends on the cause and size of your nodule or nodules. If the nodule is benign, treatment may not be necessary.  Your health care provider may monitor the nodule to see if it goes away without treatment. If the nodule continues to grow, is cancerous, or does not go away, treatment may be needed. This information is not intended to replace advice given to you by your health care provider. Make sure you discuss any questions you have with your health care provider. Document Revised: 07/01/2018 Document Reviewed: 07/04/2018 Elsevier Patient Education  [place].

## 2020-03-19 ENCOUNTER — Encounter: Payer: Self-pay | Admitting: Ophthalmology

## 2020-03-19 ENCOUNTER — Other Ambulatory Visit: Payer: Self-pay

## 2020-03-22 ENCOUNTER — Other Ambulatory Visit
Admission: RE | Admit: 2020-03-22 | Discharge: 2020-03-22 | Disposition: A | Discharge status: Home / Self Care | Payer: Medicare Other | Source: Ambulatory Visit | Attending: Ophthalmology | Admitting: Ophthalmology

## 2020-03-22 DIAGNOSIS — Z20822 Contact with and (suspected) exposure to covid-19: Secondary | ICD-10-CM | POA: Insufficient documentation

## 2020-03-22 DIAGNOSIS — Z01812 Encounter for preprocedural laboratory examination: Secondary | ICD-10-CM | POA: Diagnosis present

## 2020-03-22 LAB — SARS CORONAVIRUS 2 (TAT 6-24 HRS): SARS Coronavirus 2: NEGATIVE

## 2020-03-22 NOTE — Discharge Instructions (Signed)

## 2020-04-09 IMAGING — MG DIGITAL SCREENING BILAT W/ TOMO W/ CAD
8 series · 8 of 24 positions shown · non-contrast
Comparison: Previous exam(s).

CLINICAL DATA: Screening.

EXAM:
DIGITAL SCREENING BILATERAL MAMMOGRAM WITH TOMO AND CAD

[L CC synth-2D]
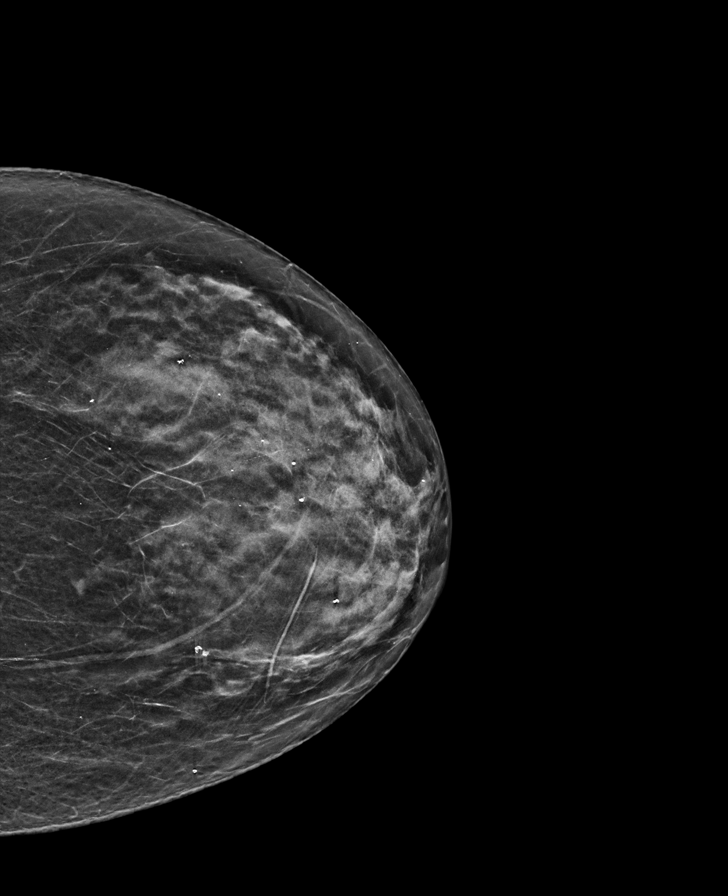

[R MLO synth-2D]
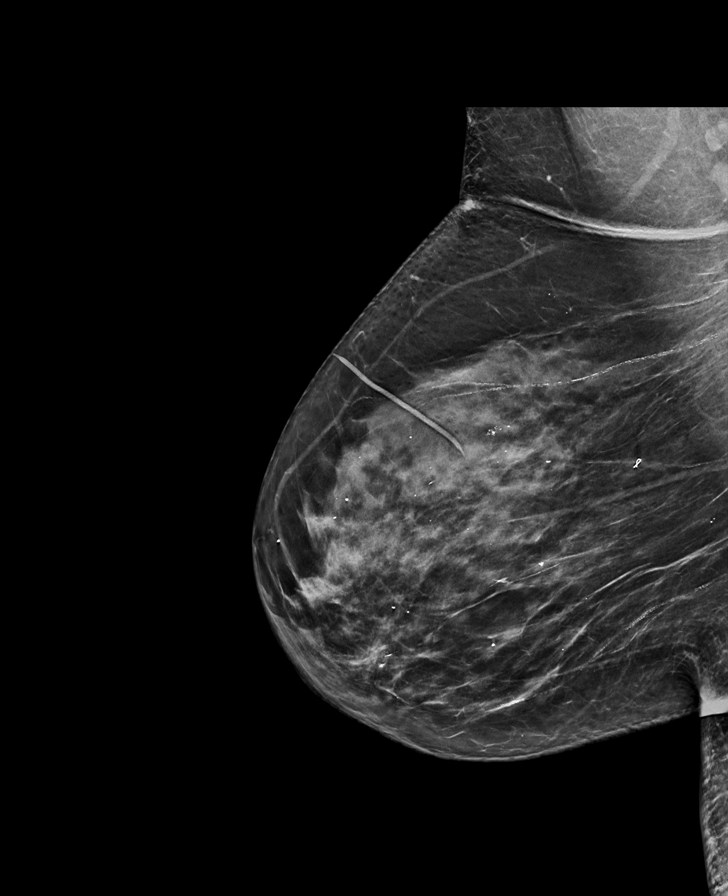

[L MLO synth-2D]
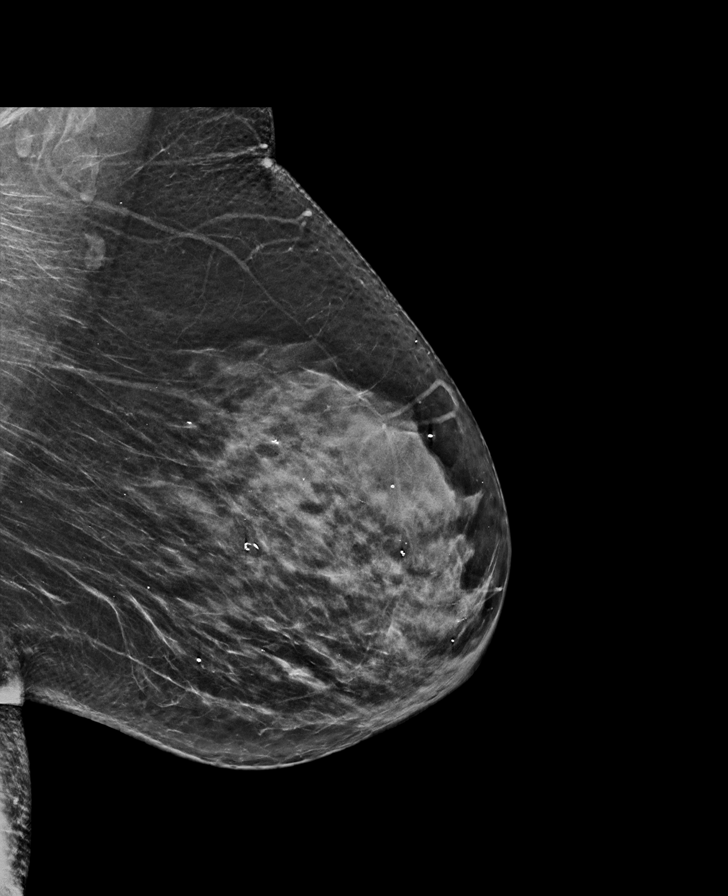

[R CC synth-2D]
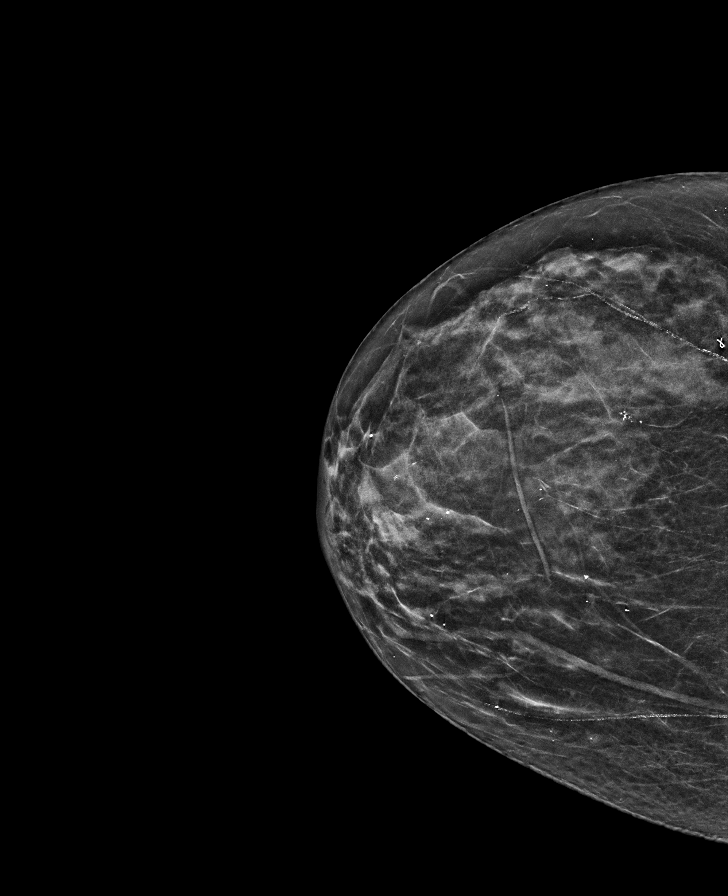

[L CC tomo · tomo slice 28/55.0]
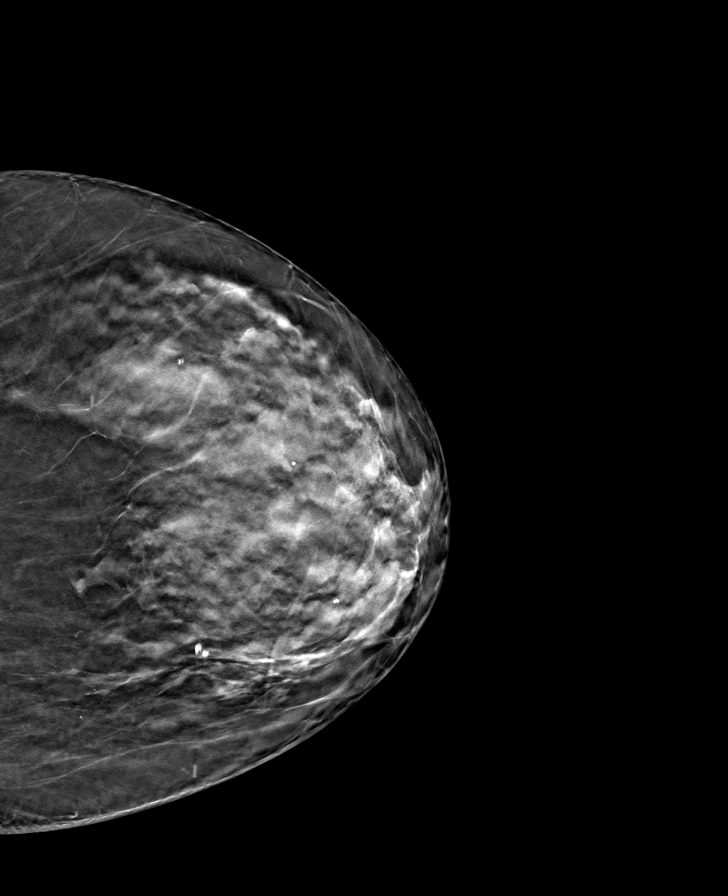

[R CC tomo · tomo slice 27/52.0]
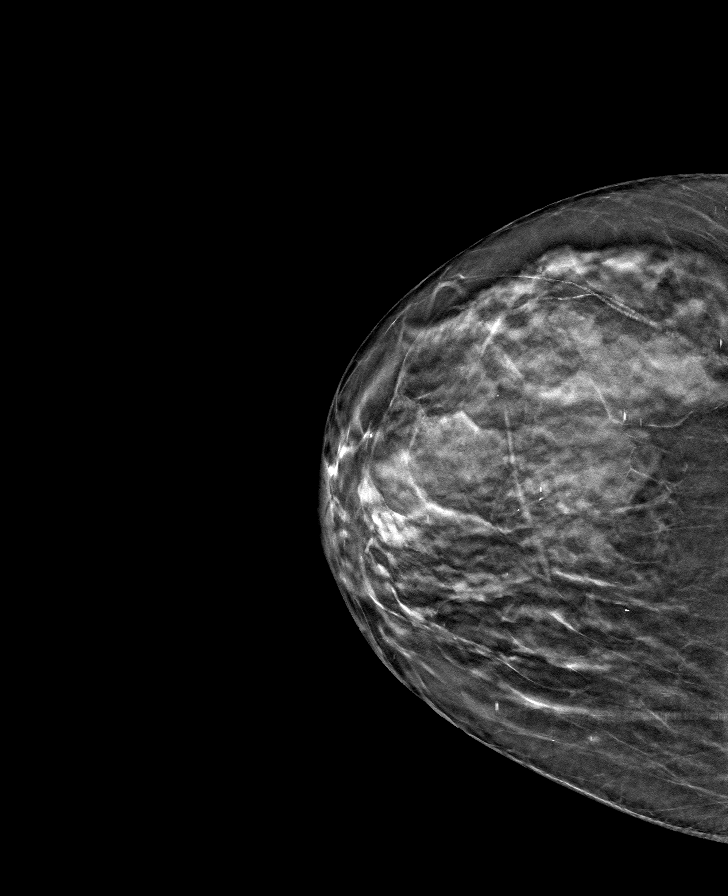

[R MLO tomo · tomo slice 33/64.0]
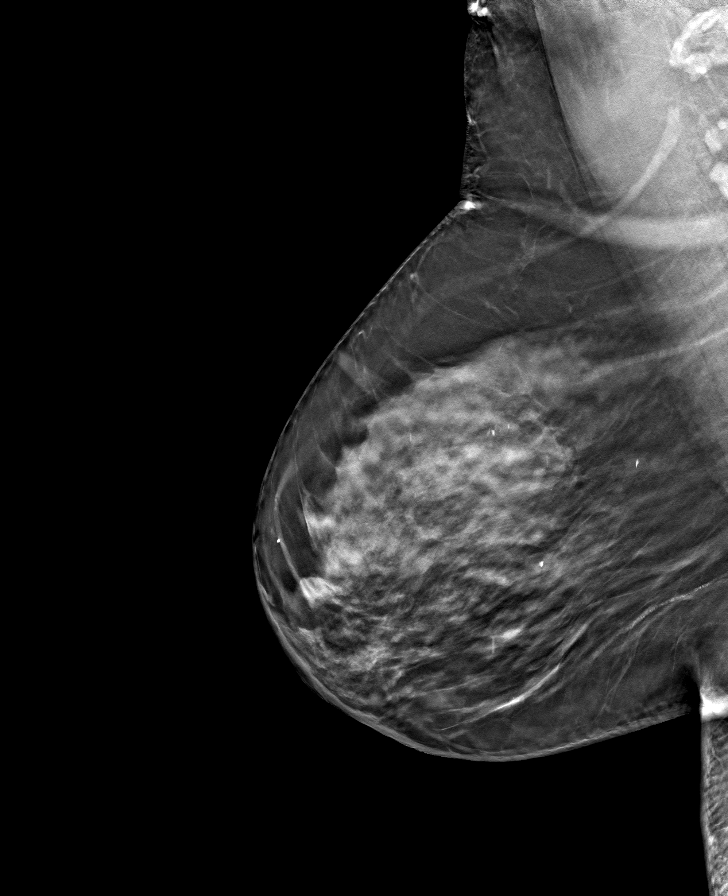

[L MLO tomo · tomo slice 32/63.0]
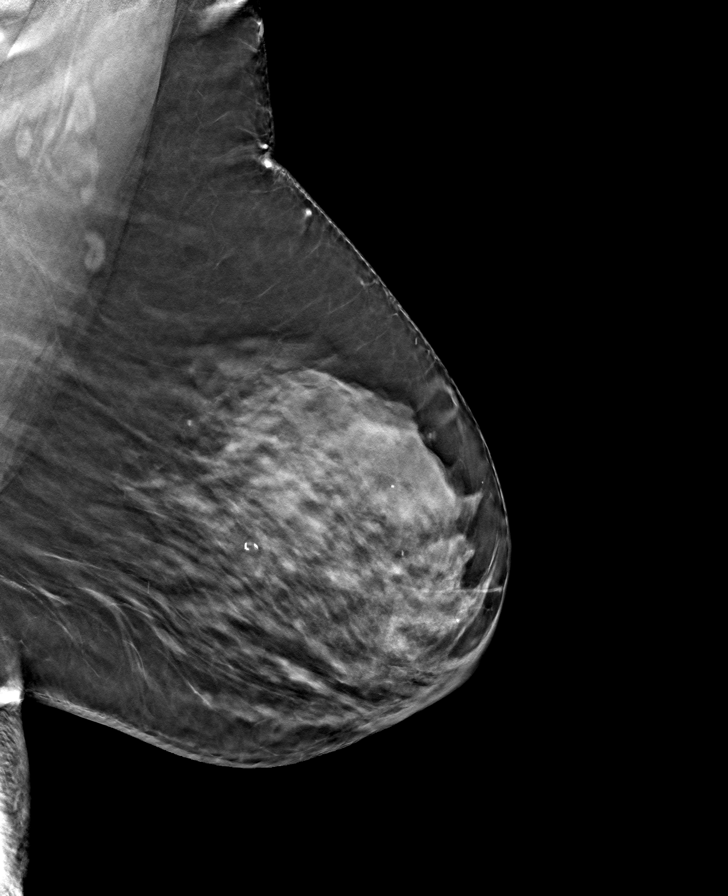

[8 of 24 positions shown; findings below may reference images not displayed]

ACR Breast Density Category c: The breast tissue is heterogeneously
dense, which may obscure small masses.
FINDINGS: There are no findings suspicious for malignancy. Images were
processed with CAD.
IMPRESSION: No mammographic evidence of malignancy. A result letter of this
screening mammogram will be mailed directly to the patient.

RECOMMENDATION:
Screening mammogram in one year. (Code:[id number])

BI-RADS CATEGORY  1: Negative.

## 2020-05-14 ENCOUNTER — Encounter: Payer: Self-pay | Admitting: Ophthalmology

## 2020-05-14 ENCOUNTER — Other Ambulatory Visit: Payer: Self-pay

## 2020-05-17 ENCOUNTER — Other Ambulatory Visit
Admission: RE | Admit: 2020-05-17 | Discharge: 2020-05-17 | Disposition: A | Discharge status: Home / Self Care | Payer: Medicare Other | Source: Ambulatory Visit | Attending: Ophthalmology | Admitting: Ophthalmology

## 2020-05-17 ENCOUNTER — Other Ambulatory Visit: Payer: Self-pay

## 2020-05-17 DIAGNOSIS — Z20822 Contact with and (suspected) exposure to covid-19: Secondary | ICD-10-CM | POA: Insufficient documentation

## 2020-05-17 DIAGNOSIS — Z01812 Encounter for preprocedural laboratory examination: Secondary | ICD-10-CM | POA: Insufficient documentation

## 2020-05-17 LAB — SARS CORONAVIRUS 2 (TAT 6-24 HRS): SARS Coronavirus 2: NEGATIVE

## 2020-05-21 ENCOUNTER — Other Ambulatory Visit: Payer: Self-pay

## 2020-05-21 ENCOUNTER — Encounter
Admission: RE | Disposition: A | Discharge status: Home / Self Care | Payer: Self-pay | Source: Home / Self Care | Attending: Ophthalmology

## 2020-05-21 ENCOUNTER — Ambulatory Visit: Payer: Medicare Other | Admitting: Anesthesiology

## 2020-05-21 ENCOUNTER — Encounter: Payer: Self-pay | Admitting: Ophthalmology

## 2020-05-21 ENCOUNTER — Ambulatory Visit
Admission: RE | Admit: 2020-05-21 | Discharge: 2020-05-21 | Disposition: A | Discharge status: Home / Self Care | Payer: Medicare Other | Attending: Ophthalmology | Admitting: Ophthalmology

## 2020-05-21 DIAGNOSIS — Z8616 Personal history of COVID-19: Secondary | ICD-10-CM | POA: Diagnosis not present

## 2020-05-21 DIAGNOSIS — M1990 Unspecified osteoarthritis, unspecified site: Secondary | ICD-10-CM | POA: Insufficient documentation

## 2020-05-21 DIAGNOSIS — J45909 Unspecified asthma, uncomplicated: Secondary | ICD-10-CM | POA: Diagnosis not present

## 2020-05-21 DIAGNOSIS — I10 Essential (primary) hypertension: Secondary | ICD-10-CM | POA: Insufficient documentation

## 2020-05-21 DIAGNOSIS — F419 Anxiety disorder, unspecified: Secondary | ICD-10-CM | POA: Insufficient documentation

## 2020-05-21 DIAGNOSIS — Z79899 Other long term (current) drug therapy: Secondary | ICD-10-CM | POA: Insufficient documentation

## 2020-05-21 DIAGNOSIS — K449 Diaphragmatic hernia without obstruction or gangrene: Secondary | ICD-10-CM | POA: Insufficient documentation

## 2020-05-21 DIAGNOSIS — H2512 Age-related nuclear cataract, left eye: Secondary | ICD-10-CM | POA: Insufficient documentation

## 2020-05-21 DIAGNOSIS — Z880 Allergy status to penicillin: Secondary | ICD-10-CM | POA: Insufficient documentation

## 2020-05-21 DIAGNOSIS — K219 Gastro-esophageal reflux disease without esophagitis: Secondary | ICD-10-CM | POA: Insufficient documentation

## 2020-05-21 DIAGNOSIS — G4730 Sleep apnea, unspecified: Secondary | ICD-10-CM | POA: Diagnosis not present

## 2020-05-21 HISTORY — PX: CATARACT EXTRACTION W/PHACO: SHX586

## 2020-05-21 SURGERY — PHACOEMULSIFICATION, CATARACT, WITH IOL INSERTION
Anesthesia: Monitor Anesthesia Care | Site: Eye | Laterality: Left

## 2020-05-21 MED ORDER — FENTANYL CITRATE (PF) 100 MCG/2ML IJ SOLN
INTRAMUSCULAR | Status: DC | PRN
  Administered 2020-05-21: 50 ug via INTRAVENOUS

## 2020-05-21 MED ORDER — EPINEPHRINE PF 1 MG/ML IJ SOLN
INTRAOCULAR | Status: DC | PRN
  Administered 2020-05-21: 48 mL via OPHTHALMIC

## 2020-05-21 MED ORDER — ACETAMINOPHEN 325 MG PO TABS: 325.0000 mg | ORAL_TABLET | ORAL | Status: DC | PRN

## 2020-05-21 MED ORDER — TETRACAINE HCL 0.5 % OP SOLN
1.0000 [drp] | OPHTHALMIC | Status: DC | PRN
  Administered 2020-05-21 (×3): 1 [drp] via OPHTHALMIC

## 2020-05-21 MED ORDER — NA CHONDROIT SULF-NA HYALURON 40-17 MG/ML IO SOLN
INTRAOCULAR | Status: DC | PRN
  Administered 2020-05-21: 1 mL via INTRAOCULAR

## 2020-05-21 MED ORDER — ACETAMINOPHEN 160 MG/5ML PO SOLN: 325.0000 mg | ORAL | Status: DC | PRN

## 2020-05-21 MED ORDER — ARMC OPHTHALMIC DILATING DROPS
1.0000 "application " | OPHTHALMIC | Status: DC | PRN
  Administered 2020-05-21 (×3): 1 via OPHTHALMIC

## 2020-05-21 MED ORDER — ONDANSETRON HCL 4 MG/2ML IJ SOLN: 4.0000 mg | Freq: Once | INTRAMUSCULAR | Status: DC | PRN

## 2020-05-21 MED ORDER — LIDOCAINE HCL (PF) 2 % IJ SOLN
INTRAOCULAR | Status: DC | PRN
  Administered 2020-05-21: 2 mL

## 2020-05-21 MED ORDER — BRIMONIDINE TARTRATE-TIMOLOL 0.2-0.5 % OP SOLN
OPHTHALMIC | Status: DC | PRN
  Administered 2020-05-21: 1 [drp] via OPHTHALMIC

## 2020-05-21 MED ORDER — MOXIFLOXACIN HCL 0.5 % OP SOLN
OPHTHALMIC | Status: DC | PRN
  Administered 2020-05-21: 0.2 mL via OPHTHALMIC

## 2020-05-21 MED ORDER — MIDAZOLAM HCL 2 MG/2ML IJ SOLN
INTRAMUSCULAR | Status: DC | PRN
  Administered 2020-05-21: 1 mg via INTRAVENOUS

## 2020-05-21 SURGICAL SUPPLY — 20 items
CANNULA ANT/CHMB 27G (MISCELLANEOUS) ×2 IMPLANT
CANNULA ANT/CHMB 27GA (MISCELLANEOUS) ×4 IMPLANT
GLOVE SURG LX 8.0 MICRO (GLOVE) ×1
GLOVE SURG LX STRL 8.0 MICRO (GLOVE) ×1 IMPLANT
GLOVE SURG TRIUMPH 8.0 PF LTX (GLOVE) ×2 IMPLANT
GOWN STRL REUS W/ TWL LRG LVL3 (GOWN DISPOSABLE) ×2 IMPLANT
GOWN STRL REUS W/TWL LRG LVL3 (GOWN DISPOSABLE) ×4
LENS IOL DIOP 20.0 (Intraocular Lens) ×2 IMPLANT
LENS IOL TECNIS MONO 20.0 (Intraocular Lens) IMPLANT
MARKER SKIN DUAL TIP RULER LAB (MISCELLANEOUS) ×2 IMPLANT
NDL FILTER BLUNT 18X1 1/2 (NEEDLE) ×1 IMPLANT
NEEDLE FILTER BLUNT 18X 1/2SAF (NEEDLE) ×1
NEEDLE FILTER BLUNT 18X1 1/2 (NEEDLE) ×1 IMPLANT
PACK EYE AFTER SURG (MISCELLANEOUS) ×2 IMPLANT
PACK OPTHALMIC (MISCELLANEOUS) ×2 IMPLANT
PACK PORFILIO (MISCELLANEOUS) ×2 IMPLANT
SYR 3ML LL SCALE MARK (SYRINGE) ×2 IMPLANT
SYR TB 1ML LUER SLIP (SYRINGE) ×2 IMPLANT
WATER STERILE IRR 250ML POUR (IV SOLUTION) ×2 IMPLANT
WIPE NON LINTING 3.25X3.25 (MISCELLANEOUS) ×2 IMPLANT

## 2020-05-21 NOTE — Anesthesia Preprocedure Evaluation (Signed)
Anesthesia Evaluation  Patient identified by MRN, date of birth, ID band Patient awake    Reviewed: Allergy & Precautions, NPO status   Airway Mallampati: II  TM Distance: >3 FB     Dental   Pulmonary asthma , sleep apnea (severe) and Continuous Positive Airway Pressure Ventilation ,    breath sounds clear to auscultation       Cardiovascular hypertension,  Rhythm:Regular Rate:Normal  TTE 2020: normal EF, mild MR   Neuro/Psych Anxiety RLS    GI/Hepatic hiatal hernia, GERD  ,  Endo/Other    Renal/GU      Musculoskeletal  (+) Arthritis ,   Abdominal   Peds  Hematology   Anesthesia Other Findings   Reproductive/Obstetrics                             Anesthesia Physical Anesthesia Plan  ASA: III  Anesthesia Plan: MAC   Post-op Pain Management:    Induction: Intravenous  PONV Risk Score and Plan: TIVA, Midazolam and Treatment may vary due to age or medical condition  Airway Management Planned: Natural Airway and Nasal Cannula  Additional Equipment:   Intra-op Plan:   Post-operative Plan:   Informed Consent: I have reviewed the patients History and Physical, chart, labs and discussed the procedure including the risks, benefits and alternatives for the proposed anesthesia with the patient or authorized representative who has indicated his/her understanding and acceptance.       Plan Discussed with: CRNA  Anesthesia Plan Comments:         Anesthesia Quick Evaluation

## 2020-05-21 NOTE — Anesthesia Postprocedure Evaluation (Signed)
Anesthesia Post Note  Patient: Amy Vang  Procedure(s) Performed: CATARACT EXTRACTION PHACO AND INTRAOCULAR LENS PLACEMENT (IOC) LEFT 3.69 00:30.9 (Left Eye)     Patient location during evaluation: PACU Anesthesia Type: MAC Level of consciousness: awake Pain management: pain level controlled Vital Signs Assessment: post-procedure vital signs reviewed and stable Respiratory status: respiratory function stable Cardiovascular status: stable Postop Assessment: no apparent nausea or vomiting Anesthetic complications: no   No complications documented.  Veda Canning

## 2020-05-21 NOTE — H&P (Signed)
All labs reviewed. Abnormal studies sent to patients PCP when indicated.  Previous H&P reviewed, patient examined, there are NO CHANGES.  Amy Vang Porfilio6/22/202112:16 PM

## 2020-05-21 NOTE — Anesthesia Procedure Notes (Signed)
Procedure Name: MAC Date/Time: 05/21/2020 12:22 PM Performed by: Cameron Ali, CRNA Pre-anesthesia Checklist: Patient identified, Emergency Drugs available, Suction available, Timeout performed and Patient being monitored Patient Re-evaluated:Patient Re-evaluated prior to induction Oxygen Delivery Method: Nasal cannula Placement Confirmation: positive ETCO2

## 2020-05-21 NOTE — Op Note (Signed)
PREOPERATIVE DIAGNOSIS:  Nuclear sclerotic cataract of the left eye.   POSTOPERATIVE DIAGNOSIS:  Nuclear sclerotic cataract of the left eye.   OPERATIVE PROCEDURE:@   SURGEON:  Birder Robson, MD.   ANESTHESIA:  Anesthesiologist: Veda Canning, MD CRNA: Cameron Ali, CRNA  1.      Managed anesthesia care. 2.     0.24ml of Shugarcaine was instilled following the paracentesis   COMPLICATIONS:  None.   TECHNIQUE:   Stop and chop   DESCRIPTION OF PROCEDURE:  The patient was examined and consented in the preoperative holding area where the aforementioned topical anesthesia was applied to the left eye and then brought back to the Operating Room where the left eye was prepped and draped in the usual sterile ophthalmic fashion and a lid speculum was placed. A paracentesis was created with the side port blade and the anterior chamber was filled with viscoelastic. A near clear corneal incision was performed with the steel keratome. A continuous curvilinear capsulorrhexis was performed with a cystotome followed by the capsulorrhexis forceps. Hydrodissection and hydrodelineation were carried out with BSS on a blunt cannula. The lens was removed in a stop and chop  technique and the remaining cortical material was removed with the irrigation-aspiration handpiece. The capsular bag was inflated with viscoelastic and the Technis [id number] lens was placed in the capsular bag without complication. The remaining viscoelastic was removed from the eye with the irrigation-aspiration handpiece. The wounds were hydrated. The anterior chamber was flushed with BSS and the eye was inflated to physiologic pressure. 0.89ml Vigamox was placed in the anterior chamber. The wounds were found to be water tight. The eye was dressed with Combigan. The patient was given protective glasses to wear throughout the day and a shield with which to sleep tonight. The patient was also given drops with which to begin a drop regimen today and  will follow-up with me in one day. Implant Name Type Inv. Item Serial No. Manufacturer Lot No. LRB No. Used Action  LENS IOL DIOP 20.0 - [id number] Intraocular Lens LENS IOL DIOP 20.0 [id number] [employer]  Left 1 Implanted    Procedure(s) with comments: CATARACT EXTRACTION PHACO AND INTRAOCULAR LENS PLACEMENT (IOC) LEFT 3.69 00:30.9 (Left) - sleep apnea  Electronically signed: Birder Robson 05/21/2020 12:41 PM

## 2020-05-21 NOTE — Transfer of Care (Signed)
Immediate Anesthesia Transfer of Care Note  Patient: Amy Vang  Procedure(s) Performed: CATARACT EXTRACTION PHACO AND INTRAOCULAR LENS PLACEMENT (IOC) LEFT 3.69 00:30.9 (Left Eye)  Patient Location: PACU  Anesthesia Type: MAC  Level of Consciousness: awake, alert  and patient cooperative  Airway and Oxygen Therapy: Patient Spontanous Breathing and Patient connected to supplemental oxygen  Post-op Assessment: Post-op Vital signs reviewed, Patient's Cardiovascular Status Stable, Respiratory Function Stable, Patent Airway and No signs of Nausea or vomiting  Post-op Vital Signs: Reviewed and stable  Complications: No complications documented.

## 2020-05-22 ENCOUNTER — Encounter: Payer: Self-pay | Admitting: Ophthalmology

## 2020-06-17 DIAGNOSIS — Z6825 Body mass index (BMI) 25.0-25.9, adult: Secondary | ICD-10-CM | POA: Insufficient documentation

## 2020-09-09 ENCOUNTER — Ambulatory Visit: Payer: Medicare Other | Admitting: Cardiovascular Disease

## 2020-09-12 ENCOUNTER — Other Ambulatory Visit: Payer: Self-pay

## 2020-09-12 ENCOUNTER — Other Ambulatory Visit: Payer: Medicare Other

## 2020-09-12 DIAGNOSIS — Z20822 Contact with and (suspected) exposure to covid-19: Secondary | ICD-10-CM

## 2020-09-13 LAB — NOVEL CORONAVIRUS, NAA: SARS-CoV-2, NAA: NOT DETECTED

## 2020-09-13 LAB — SARS-COV-2, NAA 2 DAY TAT

## 2020-09-17 ENCOUNTER — Other Ambulatory Visit: Payer: Self-pay | Admitting: Family Medicine

## 2020-09-17 DIAGNOSIS — Z1231 Encounter for screening mammogram for malignant neoplasm of breast: Secondary | ICD-10-CM

## 2020-09-19 DIAGNOSIS — M79604 Pain in right leg: Secondary | ICD-10-CM | POA: Insufficient documentation

## 2020-09-19 DIAGNOSIS — M5417 Radiculopathy, lumbosacral region: Secondary | ICD-10-CM | POA: Insufficient documentation

## 2020-09-23 ENCOUNTER — Other Ambulatory Visit: Payer: Self-pay

## 2020-09-23 ENCOUNTER — Ambulatory Visit: Payer: Medicare Other

## 2020-09-23 ENCOUNTER — Encounter: Payer: Self-pay | Admitting: Podiatry

## 2020-09-23 ENCOUNTER — Ambulatory Visit (INDEPENDENT_AMBULATORY_CARE_PROVIDER_SITE_OTHER): Payer: Medicare Other | Admitting: Podiatry

## 2020-09-23 DIAGNOSIS — M7751 Other enthesopathy of right foot: Secondary | ICD-10-CM

## 2020-09-23 DIAGNOSIS — Q828 Other specified congenital malformations of skin: Secondary | ICD-10-CM | POA: Diagnosis not present

## 2020-09-23 DIAGNOSIS — M7752 Other enthesopathy of left foot: Secondary | ICD-10-CM

## 2020-09-23 DIAGNOSIS — M778 Other enthesopathies, not elsewhere classified: Secondary | ICD-10-CM

## 2020-09-23 NOTE — Progress Notes (Signed)
Subjective:  Patient ID: Amy Vang, female    DOB: Oct 09, 1946,  MRN: [id number] HPI Chief Complaint  Patient presents with   Foot Pain    Sub 5th MPJ left - small, callused area x few weeks, tender when walking sometimes, no treatment   New Patient (Initial Visit)    Est pt 2017    74 y.o. female presents with the above complaint.   ROS: Denies fever chills nausea vomiting muscle aches pains calf pain back pain chest pain shortness of breath.  Past Medical History:  Diagnosis Date   Allergic rhinitis    Anxiety    Chronic constipation    COVID-19 08/2019   Degenerative disc disease, cervical    Essential hypertension    GERD (gastroesophageal reflux disease)    Hiatal hernia    History of echocardiogram    in epic 04-21-2019,  normal w/ ef 60-65% and mild mitral regurg, no stenosis or restriction   History of TIAs    per pt age 30;  2008;  05/ 2013;  pt stated no residual was from sleep doctor more than likely sleep deprived than actual tia   Motion sickness    cars   Nocturia    OSA on CPAP    Severe ,, CPAP 12 MM H2O- Dr Radford Pax   PONV (postoperative nausea and vomiting)    Restless leg syndrome    Rosacea    Seasonal asthma    no inhaler   Past Surgical History:  Procedure Laterality Date   ANTERIOR CERVICAL DECOMP/DISCECTOMY FUSION N/A 10/25/2017   Procedure: CERVICAL SIX- CERVICAL SEVEN ANTERIOR CERVICAL DECOMPRESSION/DISCECTOMY FUSION, INTERBODY PROSTHESIS AND ANTERIOR PLATING;  Surgeon: Newman Pies, MD;  Location: [place];  Service: Neurosurgery;  Laterality: N/A;  CERVICAL 6- CERVICAL 7 ANTERIOR CERVICAL DECOMPRESSION/DISCECTOMY FUSION, INTERBODY PROSTHESIS AND ANTERIOR PLATING   BILATERAL BENIGN BREAST BX'S  20 YRS AGO   BREAST BIOPSY Right    benign   BREAST EXCISIONAL BIOPSY Bilateral over 10 years ago   benign   CARDIAC CATHETERIZATION N/A 08/13/2016   Procedure: Right Heart Cath;  Surgeon: Larey Dresser, MD;   Location: [facility];  Service: Cardiovascular;  Laterality: N/A;    normal study, no evidence pulm HTN   CARPAL TUNNEL RELEASE  2009   BILATERAL   CATARACT EXTRACTION W/PHACO Right 02/27/2020   Procedure: CATARACT EXTRACTION PHACO AND INTRAOCULAR LENS PLACEMENT (IOC) RIGHT 3.60 00:23.9;  Surgeon: Birder Robson, MD;  Location: [place];  Service: Ophthalmology;  Laterality: Right;  sleep apnea   CATARACT EXTRACTION W/PHACO Left 05/21/2020   Procedure: CATARACT EXTRACTION PHACO AND INTRAOCULAR LENS PLACEMENT (IOC) LEFT 3.69 00:30.9;  Surgeon: Birder Robson, MD;  Location: [place];  Service: Ophthalmology;  Laterality: Left;  sleep apnea   CYSTOCELE REPAIR  12/11/2011   Procedure: ANTERIOR REPAIR (CYSTOCELE);  Surgeon: Claybon Jabs, MD;  Location: [facility];  Service: Urology;  Laterality: N/A;  1 1/2 hour requested for case  Anterior repair and mid Urethral Sling   MASS EXCISION N/A 07/24/2019   Procedure: EXCISION OF VAGINAL FOREIGN BODY;  Surgeon: Kathie Rhodes, MD;  Location: [place];  Service: Urology;  Laterality: N/A;   NASAL SEPTOPLASTY W/ TURBINOPLASTY Bilateral 11/18/2016   Procedure: NASAL SEPTOPLASTY WITH BILATERAL TURBINATE REDUCTION;  Surgeon: Jerrell Belfast, MD;  Location: [place];  Service: ENT;  Laterality: Bilateral;   NONINVASIVE VASCULAR CAROTID STUDY  09-14-2007   BILATERAL MILD MIX PLAQUE THROUGHOUT, NO SIGNIFICANT  BILATERAL ICA STENOSIS   PUBOVAGINAL SLING  12/11/2011   Procedure: Gaynelle Arabian;  Surgeon: Claybon Jabs, MD;  Location: [facility];  Service: Urology;  Laterality: N/A;   PULLEY RELEASE RIGHT THUMB  2009   SHOULDER ARTHROSCOPY DISTAL CLAVICLE EXCISION AND OPEN ROTATOR CUFF REPAIR Right 01-17-2019   dr supple @[facility]    SINUS ENDO WITH FUSION Bilateral 11/18/2016   Procedure: BILATERAL ENDOSCOPIC SINUS SURGERY;  Surgeon: Jerrell Belfast, MD;  Location: [facility];   Service: ENT;  Laterality: Bilateral;   VAGINAL HYSTERECTOMY  AGE 30    Current Outpatient Medications:    acetaminophen (TYLENOL) 325 MG tablet, Take 650 mg by mouth every 6 (six) hours as needed., Disp: , Rfl:    albuterol (VENTOLIN HFA) 108 (90 Base) MCG/ACT inhaler, Inhale into the lungs every 6 (six) hours as needed for wheezing or shortness of breath., Disp: , Rfl:    citalopram (CELEXA) 20 MG tablet, 10 mg oral daily, Disp: , Rfl: 2   cyclobenzaprine (FLEXERIL) 10 MG tablet, Take 1 tablet (10 mg total) by mouth 3 (three) times daily as needed for muscle spasms. (Patient not taking: Reported on 02/27/2020), Disp: 50 tablet, Rfl: 1   dexlansoprazole (DEXILANT) 60 MG capsule, Take 60 mg by mouth daily., Disp: , Rfl:    Dextromethorphan Polistirex (DELSYM PO), Take by mouth 2 (two) times daily as needed. (Patient not taking: Reported on 05/14/2020), Disp: , Rfl:    dicyclomine (BENTYL) 20 MG tablet, Take 20 mg by mouth 4 (four) times daily., Disp: , Rfl:    esomeprazole (NEXIUM) 40 MG capsule, Take 40 mg by mouth daily., Disp: , Rfl:    famotidine (PEPCID) 40 MG tablet, Take 40 mg by mouth at bedtime., Disp: , Rfl:    fexofenadine (ALLEGRA) 180 MG tablet, Take 180 mg by mouth daily., Disp: , Rfl:    fluticasone (FLONASE) 50 MCG/ACT nasal spray, Place 2 sprays into both nostrils 2 (two) times daily as needed. , Disp: , Rfl:    guaiFENesin (MUCINEX) 600 MG 12 hr tablet, Take by mouth 2 (two) times daily as needed. (Patient not taking: Reported on 05/14/2020), Disp: , Rfl:    hydrochlorothiazide (HYDRODIURIL) 25 MG tablet, Take 1 tablet (25 mg total) by mouth daily., Disp:  , Rfl:    HYDROcodone-acetaminophen (NORCO/VICODIN) 5-325 MG tablet, Take 1 tablet by mouth 4 (four) times daily as needed., Disp: , Rfl:    HYDROcodone-homatropine (HYCODAN) 5-1.5 MG/5ML syrup, Take by mouth., Disp: , Rfl:    LORazepam (ATIVAN) 0.5 MG tablet, Take 0.5 mg by mouth at bedtime., Disp: , Rfl:     methocarbamol (ROBAXIN) 500 MG tablet, Take 500 mg by mouth every 6 (six) hours as needed for muscle spasms., Disp: , Rfl:    pantoprazole (PROTONIX) 40 MG tablet, Take 40 mg by mouth 2 (two) times daily., Disp: , Rfl:    polyethylene glycol (MIRALAX / GLYCOLAX) packet, Take 17 g by mouth daily as needed for mild constipation. , Disp: , Rfl:    potassium chloride (MICRO-K) 10 MEQ CR capsule, Take 10 mEq by mouth 2 (two) times daily. , Disp: , Rfl:    Probiotic Product (ALIGN PO), Take 1 tablet by mouth daily., Disp: , Rfl:    rOPINIRole (REQUIP) 0.5 MG tablet, Take 0.5 mg by mouth 2 (two) times daily., Disp: , Rfl:    sodium chloride (OCEAN) 0.65 % SOLN nasal spray, Place 1 spray into both nostrils 4 (four) times daily as needed for  congestion. , Disp: , Rfl:    traZODone (DESYREL) 50 MG tablet, Take 50 mg by mouth at bedtime., Disp: , Rfl:   Allergies  Allergen Reactions   Penicillins Hives, Rash and Other (See Comments)    Patient has had a PCN reaction with immediate rash, facial/tongue/throat swelling, SOB, and or lightheadedness with hypotension; Has also developed severe rash involving mucus membranes or skin necrosis.   Has patient had a PCN reaction that required hospitalization No Has patient had a PCN reaction occurring within the last 10 years: No If all of the above answers are "NO", then may proceed with Cephalosporin use.    Hydroxyzine Hcl     UNSPECIFIED REACTION    Ciprofloxacin Anxiety   Lactose Intolerance (Gi) Other (See Comments)    bloating   Septra [Sulfamethoxazole-Trimethoprim] Rash   Review of Systems Objective:  There were no vitals filed for this visit.  General: Well developed, nourished, in no acute distress, alert and oriented x3   Dermatological: Skin is warm, dry and supple bilateral. Nails x 10 are well maintained; remaining integument appears unremarkable at this time. There are no open sores, no preulcerative lesions, no rash or signs of  infection present.  Solitary porokeratotic lesion beneath the fifth metatarsal head area left foot.  Vascular: Dorsalis Pedis artery and Posterior Tibial artery pedal pulses are 2/4 bilateral with immedate capillary fill time. Pedal hair growth present. No varicosities and no lower extremity edema present bilateral.   Neruologic: Grossly intact via light touch bilateral. Vibratory intact via tuning fork bilateral. Protective threshold with Semmes Wienstein monofilament intact to all pedal sites bilateral. Patellar and Achilles deep tendon reflexes 2+ bilateral. No Babinski or clonus noted bilateral.   Musculoskeletal: No gross boney pedal deformities bilateral. No pain, crepitus, or limitation noted with foot and ankle range of motion bilateral. Muscular strength 5/5 in all groups tested bilateral.  She has a small palpable bursa beneath the fifth metatarsal head of the left foot.  There is mild overlying erythema no warmth no fluctuance  Gait: Unassisted, Nonantalgic.    Radiographs:  None taken  Assessment & Plan:   Assessment: Bursitis subfifth left with porokeratosis.  Plan: I injected 2 mg of dexamethasone plantar to the fifth metatarsal head of the left foot into the bursa.  I injected 2 mg of dexamethasone extra Betadine skin prep with local anesthetic.  Tolerated procedure well.  I also enucleated the lesion and placed a Band-Aid.     Scout Gumbs T. [place], [place]

## 2020-10-09 ENCOUNTER — Other Ambulatory Visit: Payer: Self-pay

## 2020-10-09 ENCOUNTER — Ambulatory Visit (INDEPENDENT_AMBULATORY_CARE_PROVIDER_SITE_OTHER): Payer: Medicare Other | Admitting: Dermatology

## 2020-10-09 DIAGNOSIS — L853 Xerosis cutis: Secondary | ICD-10-CM

## 2020-10-09 DIAGNOSIS — D1800 Hemangioma unspecified site: Secondary | ICD-10-CM | POA: Diagnosis not present

## 2020-10-09 DIAGNOSIS — L821 Other seborrheic keratosis: Secondary | ICD-10-CM

## 2020-10-09 NOTE — Progress Notes (Signed)
   Follow-Up Visit   Subjective  Amy Vang is a 74 y.o. female who presents for the following: Rough spots (chest, back, hits when applying lotion and showering) and Brown spot (right forearm, new darker spot in the center that patient is worried about). Not bothersome.    The following portions of the chart were reviewed this encounter and updated as appropriate:      Review of Systems:  No other skin or systemic complaints except as noted in HPI or Assessment and Plan.  Objective  Well appearing patient in no apparent distress; mood and affect are within normal limits.  A focused examination was performed including arms, back, chest. Relevant physical exam findings are noted in the Assessment and Plan.  Objective  Right upper chest, right forearm, lower back: Stuck-on, waxy, tan-brown papules --Discussed benign etiology and prognosis.    Assessment & Plan  Seborrheic keratosis Right upper chest, right forearm, lower back  Reassured benign age-related growth.  Recommend observation.  Discussed cryotherapy if spot(s) become irritated or inflamed.   Xerosis - diffuse xerotic patches - recommend gentle, hydrating skin care - gentle skin care handout given  Hemangiomas - Red papules - Discussed benign nature - Observe - Call for any changes   Return if symptoms worsen or fail to improve.   IJamesetta Orleans, CMA, am acting as scribe for Brendolyn Patty, MD .  Documentation: I have reviewed the above documentation for accuracy and completeness, and I agree with the above.  Brendolyn Patty MD

## 2020-10-09 NOTE — Patient Instructions (Addendum)
Seborrheic Keratosis  What causes seborrheic keratoses? Seborrheic keratoses are harmless, common skin growths that first appear during adult life.  As time goes by, more growths appear.  Some people may develop a large number of them.  Seborrheic keratoses appear on both covered and uncovered body parts.  They are not caused by sunlight.  The tendency to develop seborrheic keratoses can be inherited.  They vary in color from skin-colored to gray, brown, or even black.  They can be either smooth or have a rough, warty surface.   Seborrheic keratoses are superficial and look as if they were stuck on the skin.  Under the microscope this type of keratosis looks like layers upon layers of skin.  That is why at times the top layer may seem to fall off, but the rest of the growth remains and re-grows.    Treatment Seborrheic keratoses do not need to be treated, but can easily be removed in the office.  Seborrheic keratoses often cause symptoms when they rub on clothing or jewelry.  Lesions can be in the way of shaving.  If they become inflamed, they can cause itching, soreness, or burning.  Removal of a seborrheic keratosis can be accomplished by freezing, burning, or surgery. If any spot bleeds, scabs, or grows rapidly, please return to have it checked, as these can be an indication of a skin cancer.   Dry Skin Care  What causes dry skin?  Dry skin is common and results from inadequate moisture in the outer skin layers. Dry skin usually results from the excessive loss of moisture from the skin surface. This occurs due to two major factors: 1. Normally the skin's oil glands deposit a layer of oil on the skin's surface. This layer of oil prevents the loss of moisture from the skin. Exposure to soaps, cleaners, solvents, and disinfectants removes this oily film, allowing water to escape. 2. Water loss from the skin increases when the humidity is low. During winter months we spend a lot of time indoors where  the air is heated. Heated air has very low humidity. This also contributes to dry skin.  A tendency for dry skin may accompany such disorders as eczema. Also, as people age, the number of functioning oil glands decreases, and the tendency toward dry skin can be a sensation of skin tightness when emerging from the shower.  How do I manage dry skin?  1. Humidify your environment. This can be accomplished by using a humidifier in your bedroom at night during winter months. 2. Bathing can actually put moisture back into your skin if done right. Take the following steps while bathing to sooth dry skin:  Avoid hot water, which only dries the skin and makes itching worse. Use warm water.  Avoid washcloths or extensive rubbing or scrubbing.  Use mild soaps like unscented Dove, Oil of Olay, Cetaphil, Basis, or CeraVe.  If you take baths rather than showers, rinse off soap residue with clean water before getting out of tub.  Once out of the shower/tub, pat dry gently with a soft towel. Leave your skin damp.  While still damp, apply any medicated ointment/cream you were prescribed to the affected areas. After you apply your medicated ointment/cream, then apply your moisturizer to your whole body.This is the most important step in dry skin care. If this is omitted, your skin will continue to be dry.  The choice of moisturizer is also very important. In general, lotion will not provider enough moisture to severely   dry skin because it is water based. You should use an ointment or cream. Moisturizers should also be unscented. Good choices include Vaseline (plain petrolatum), Aquaphor, Cetaphil, CeraVe, Vanicream, DML Forte, Aveeno moisture, or Eucerin Cream.  Bath oils can be helpful, but do not replace the application of moisturizer after the bath. In addition, they make the tub slippery causing an increased risk for falls. Therefore, we do not recommend their use.  

## 2020-10-21 ENCOUNTER — Ambulatory Visit
Admission: RE | Admit: 2020-10-21 | Discharge: 2020-10-21 | Disposition: A | Discharge status: Home / Self Care | Payer: Medicare Other | Source: Ambulatory Visit | Attending: Family Medicine | Admitting: Family Medicine

## 2020-10-21 ENCOUNTER — Other Ambulatory Visit: Payer: Self-pay

## 2020-10-21 DIAGNOSIS — Z1231 Encounter for screening mammogram for malignant neoplasm of breast: Secondary | ICD-10-CM

## 2020-10-23 ENCOUNTER — Ambulatory Visit: Payer: Medicare Other | Admitting: Cardiovascular Disease

## 2020-12-02 ENCOUNTER — Ambulatory Visit: Payer: Medicare Other | Admitting: Cardiovascular Disease

## 2020-12-23 DIAGNOSIS — G4733 Obstructive sleep apnea (adult) (pediatric): Secondary | ICD-10-CM | POA: Diagnosis not present

## 2020-12-27 DIAGNOSIS — J3089 Other allergic rhinitis: Secondary | ICD-10-CM | POA: Diagnosis not present

## 2020-12-27 DIAGNOSIS — R053 Chronic cough: Secondary | ICD-10-CM | POA: Diagnosis not present

## 2020-12-31 ENCOUNTER — Encounter: Payer: Self-pay | Admitting: Cardiovascular Disease

## 2020-12-31 ENCOUNTER — Ambulatory Visit: Payer: Medicare Other | Admitting: Cardiovascular Disease

## 2020-12-31 ENCOUNTER — Other Ambulatory Visit: Payer: Self-pay

## 2020-12-31 DIAGNOSIS — G4733 Obstructive sleep apnea (adult) (pediatric): Secondary | ICD-10-CM | POA: Diagnosis not present

## 2020-12-31 DIAGNOSIS — I10 Essential (primary) hypertension: Secondary | ICD-10-CM

## 2020-12-31 DIAGNOSIS — I340 Nonrheumatic mitral (valve) insufficiency: Secondary | ICD-10-CM | POA: Diagnosis not present

## 2020-12-31 DIAGNOSIS — I272 Pulmonary hypertension, unspecified: Secondary | ICD-10-CM | POA: Diagnosis not present

## 2020-12-31 NOTE — Patient Instructions (Signed)
Medication Instructions:  No changes  If you need a refill on your cardiac medications before your next appointment, please call your pharmacy.    Lab work: No new labs needed   If you have labs (blood work) drawn today and your tests are completely normal, you will receive your results only by: . MyChart Message (if you have MyChart) OR . A paper copy in the mail If you have any lab test that is abnormal or we need to change your treatment, we will call you to review the results.   Testing/Procedures: No new testing needed   Follow-Up: At [facility], you and your health needs are our priority.  As part of our continuing mission to provide you with exceptional heart care, we have created designated Provider Care Teams.  These Care Teams include your primary Cardiologist (physician) and Advanced Practice Providers (APPs -  Physician Assistants and Nurse Practitioners) who all work together to provide you with the care you need, when you need it.  . You will need a follow up appointment as needed  . Providers on your designated Care Team:   . Christopher Berge, NP . Ryan Dunn, PA-C . Jacquelyn Visser, PA-C  Any Other Special Instructions Will Be Listed Below (If Applicable).  COVID-19 Vaccine Information can be found at: [email or web address] For questions related to vaccine distribution or appointments, please email [email or web address] or call [phone].     

## 2020-12-31 NOTE — Progress Notes (Addendum)
Cardiology Office Note  Date:  12/31/2020   ID:  Amy Vang, DOB June 14, 1946, MRN [id number]  PCP:  Carol Ada, MD   Chief Complaint  Patient presents with  . Other    12 month f/u no complaints today. Meds reviewed verbally with pt.    HPI:  Amy Vang is a 75 y.o. female with a history of  OSA, HTN  GERD pulm HTN, by previous echocardiography previously seen [facility] August 2017 Normal right heart pressure by [place] 07/2016 Presents for SOB, hypertension, aortic atherosclerosis on CT scan  Last seen in clinic by one of our providers: 05/30/2019 Reports that she feels well with no complaints No regular exercise program but stays active  Prior CT chest May 2017 images pulled up,   mild  diffuse aortic athero  Records requested from primary care, including lab work Note indicating history of history of insomnia  Lab work reviewed  Total cholesterol 124, LDL 57  Abnormal EKG as detailed below discussed and detail He denies any chest pain symptoms concerning for angina  EKG personally reviewed by myself on todays visit Shows normal sinus rhythm rate 64 bpm T wave abnormality V5, V6 1 and aVL No change from prior EKG dating back several years Prior stress test for evaluation of abnormal EKG  Reports having severe GERD Symptoms better on Nexium 40 twice daily Having trouble obtaining the medication secondary to cost, formulary issues  Previous cardiac work-up reviewed with her  2D echo showed normal LVF with diastolic dysfunction, moderate TR and moderate pulmonary HTN.    chest CT angio which was negative for pulmonary emboli and PFTs showed reduced DLCO at 64% predicted.    CPAP.  She tolerates the mask and feels the pressure is adequate.  C6-7 herniated disc, spondylosis, cervicalgia, cervical radiculopathy  extensive workup for her dyspnea.   Echo showed elevated PA pressure at 46 mmHg with normal LV and RV systolic function.    CTA chest showed  no PE or interstitial lung disease.   PFTs were normal except for decreased DLCO.   Cardiolite showed no ischemia or infarction.   CPX showed near-normal functional capacity, primarily limited by body habitus.  Echocardiogram May 2020  1. The left ventricle has normal systolic function with an ejection fraction of 60-65%. The cavity size was normal. There is mildly increased left ventricular wall thickness. Left ventricular diastolic Doppler parameters are consistent with impaired  relaxation.  2. The right ventricle has normal systolic function. The cavity was normal. There is no increase in right ventricular wall thickness.Unable to estimate RVSP  3. Aortic valve regurgitation is mild by color flow Doppler.  4. Mild mitral valve regurgitation   PMH:   has a past medical history of Allergic rhinitis, Anxiety, Chronic constipation, COVID-19 (08/2019), Degenerative disc disease, cervical, Essential hypertension, GERD (gastroesophageal reflux disease), Hiatal hernia, History of echocardiogram, History of TIAs, Motion sickness, Nocturia, OSA on CPAP, PONV (postoperative nausea and vomiting), Restless leg syndrome, Rosacea, and Seasonal asthma.  PSH:    Past Surgical History:  Procedure Laterality Date  . ANTERIOR CERVICAL DECOMP/DISCECTOMY FUSION N/A 10/25/2017   Procedure: CERVICAL SIX- CERVICAL SEVEN ANTERIOR CERVICAL DECOMPRESSION/DISCECTOMY FUSION, INTERBODY PROSTHESIS AND ANTERIOR PLATING;  Surgeon: Newman Pies, MD;  Location: [place];  Service: Neurosurgery;  Laterality: N/A;  CERVICAL 6- CERVICAL 7 ANTERIOR CERVICAL DECOMPRESSION/DISCECTOMY FUSION, INTERBODY PROSTHESIS AND ANTERIOR PLATING  . BILATERAL BENIGN BREAST BX'S  20 YRS AGO  . BREAST BIOPSY Right    benign  .  BREAST EXCISIONAL BIOPSY Bilateral over 10 years ago   benign  . CARDIAC CATHETERIZATION N/A 08/13/2016   Procedure: Right Heart Cath;  Surgeon: Larey Dresser, MD;  Location: [place] [facility];  Service:  Cardiovascular;  Laterality: N/A;    normal study, no evidence pulm HTN  . CARPAL TUNNEL RELEASE  2009   BILATERAL  . CATARACT EXTRACTION W/PHACO Right 02/27/2020   Procedure: CATARACT EXTRACTION PHACO AND INTRAOCULAR LENS PLACEMENT (IOC) RIGHT 3.60 00:23.9;  Surgeon: Birder Robson, MD;  Location: [place];  Service: Ophthalmology;  Laterality: Right;  sleep apnea  . CATARACT EXTRACTION W/PHACO Left 05/21/2020   Procedure: CATARACT EXTRACTION PHACO AND INTRAOCULAR LENS PLACEMENT (IOC) LEFT 3.69 00:30.9;  Surgeon: Birder Robson, MD;  Location: [place];  Service: Ophthalmology;  Laterality: Left;  sleep apnea  . CYSTOCELE REPAIR  12/11/2011   Procedure: ANTERIOR REPAIR (CYSTOCELE);  Surgeon: Claybon Jabs, MD;  Location[facility];  Service: Urology;  Laterality: N/A;  1 1/2 hour requested for case  Anterior repair and mid Urethral Sling  . MASS EXCISION N/A 07/24/2019   Procedure: EXCISION OF VAGINAL FOREIGN BODY;  Surgeon: Kathie Rhodes, MD;  Location: [facility];  Service: Urology;  Laterality: N/A;  . NASAL SEPTOPLASTY W/ TURBINOPLASTY Bilateral 11/18/2016   Procedure: NASAL SEPTOPLASTY WITH BILATERAL TURBINATE REDUCTION;  Surgeon: Jerrell Belfast, MD;  Location: [place];  Service: ENT;  Laterality: Bilateral;  . NONINVASIVE VASCULAR CAROTID STUDY  09-14-2007   BILATERAL MILD MIX PLAQUE THROUGHOUT, NO SIGNIFICANT BILATERAL ICA STENOSIS  . PUBOVAGINAL SLING  12/11/2011   Procedure: Gaynelle Arabian;  Surgeon: Claybon Jabs, MD;  Location: [facility];  Service: Urology;  Laterality: N/A;  . PULLEY RELEASE RIGHT THUMB  2009  . SHOULDER ARTHROSCOPY DISTAL CLAVICLE EXCISION AND OPEN ROTATOR CUFF REPAIR Right 01-17-2019   dr supple @[facility]   . SINUS ENDO WITH FUSION Bilateral 11/18/2016   Procedure: BILATERAL ENDOSCOPIC SINUS SURGERY;  Surgeon: Jerrell Belfast, MD;  Location: [place];  Service: ENT;  Laterality: Bilateral;  .  VAGINAL HYSTERECTOMY  AGE 64    Current Outpatient Medications  Medication Sig Dispense Refill  . acetaminophen (TYLENOL) 325 MG tablet Take 650 mg by mouth every 6 (six) hours as needed.    Marland Kitchen albuterol (VENTOLIN HFA) 108 (90 Base) MCG/ACT inhaler Inhale into the lungs every 6 (six) hours as needed for wheezing or shortness of breath.    . citalopram (CELEXA) 20 MG tablet Take 20 mg by mouth daily.  2  . cyclobenzaprine (FLEXERIL) 10 MG tablet Take 1 tablet (10 mg total) by mouth 3 (three) times daily as needed for muscle spasms. 50 tablet 1  . Dextromethorphan Polistirex (DELSYM PO) Take by mouth 2 (two) times daily as needed.    . dicyclomine (BENTYL) 20 MG tablet Take 20 mg by mouth 4 (four) times daily.    Marland Kitchen esomeprazole (NEXIUM) 40 MG capsule Take 40 mg by mouth daily.    . famotidine (PEPCID) 40 MG tablet Take 40 mg by mouth at bedtime.    . fexofenadine (ALLEGRA) 180 MG tablet Take 180 mg by mouth daily.    . fluticasone (FLONASE) 50 MCG/ACT nasal spray Place 2 sprays into both nostrils 2 (two) times daily as needed.     Marland Kitchen guaiFENesin (MUCINEX) 600 MG 12 hr tablet Take by mouth 2 (two) times daily as needed.    . hydrochlorothiazide (HYDRODIURIL) 25 MG tablet Take 1 tablet (25 mg total)  by mouth daily.    Marland Kitchen HYDROcodone-acetaminophen (NORCO/VICODIN) 5-325 MG tablet Take 1 tablet by mouth 4 (four) times daily as needed.    Marland Kitchen HYDROcodone-homatropine (HYCODAN) 5-1.5 MG/5ML syrup Take by mouth.    Marland Kitchen LORazepam (ATIVAN) 0.5 MG tablet Take 0.5 mg by mouth at bedtime.    . methocarbamol (ROBAXIN) 500 MG tablet Take 500 mg by mouth every 6 (six) hours as needed for muscle spasms.    . pantoprazole (PROTONIX) 40 MG tablet Take 40 mg by mouth 2 (two) times daily.    . polyethylene glycol (MIRALAX / GLYCOLAX) packet Take 17 g by mouth daily as needed for mild constipation.     . potassium chloride (MICRO-K) 10 MEQ CR capsule Take 10 mEq by mouth 2 (two) times daily.     . Probiotic Product (ALIGN  PO) Take 1 tablet by mouth daily.    . sodium chloride (OCEAN) 0.65 % SOLN nasal spray Place 1 spray into both nostrils 4 (four) times daily as needed for congestion.     . traZODone (DESYREL) 50 MG tablet Take 50 mg by mouth at bedtime.     No current facility-administered medications for this visit.     Allergies:   Penicillins, Hydroxyzine hcl, Ciprofloxacin, Lactose intolerance (gi), and Septra [sulfamethoxazole-trimethoprim]   Social History:  The patient  reports that she has never smoked. She has never used smokeless tobacco. She reports that she does not drink alcohol and does not use drugs.   Family History:   family history includes Alzheimer's disease in her mother; CAD in her brother; Colon cancer in her maternal aunt; Diabetes in her brother, mother, and sister; Heart attack in her brother and paternal grandfather; Hypertension in her sister; Leukemia in her brother; Stroke in her mother and paternal grandmother. She was adopted.    Review of Systems: Review of Systems  Constitutional: Negative.   HENT: Negative.   Respiratory: Negative.   Cardiovascular: Negative.   Gastrointestinal: Negative.   Musculoskeletal: Negative.   Neurological: Negative.   Psychiatric/Behavioral: Negative.   All other systems reviewed and are negative.   PHYSICAL EXAM: VS:  BP 136/78 (BP Location: Left Arm, Patient Position: Sitting, Cuff Size: Large)   Pulse 64   Ht 5\' 5"  (1.651 m)   Wt 177 lb (80.3 kg)   SpO2 98%   BMI 29.45 kg/m  , BMI Body mass index is 29.45 kg/m. Constitutional:  oriented to person, place, and time. No distress.  HENT:  Head: Grossly normal Eyes:  no discharge. No scleral icterus.  Neck: No JVD, no carotid bruits  Cardiovascular: Regular rate and rhythm, no murmurs appreciated Pulmonary/Chest: Clear to auscultation bilaterally, no wheezes or rails Abdominal: Soft.  no distension.  no tenderness.  Musculoskeletal: Normal range of motion Neurological:  normal  muscle tone. Coordination normal. No atrophy Skin: Skin warm and dry Psychiatric: normal affect, pleasant   Recent Labs: No results found for requested labs within last 8760 hours.    Lipid Panel Lab Results  Component Value Date   CHOL 142 04/12/2012   HDL 29 (L) 04/12/2012   LDLCALC 75 04/12/2012   TRIG 192 (H) 04/12/2012      Wt Readings from Last 3 Encounters:  12/31/20 177 lb (80.3 kg)  05/21/20 174 lb (78.9 kg)  03/11/20 178 lb (80.7 kg)     ASSESSMENT AND PLAN:  Problem List Items Addressed This Visit      Cardiology Problems   Pulmonary HTN ([place]) - Primary  Benign essential HTN     Other   OSA (obstructive sleep apnea)    Other Visit Diagnoses    Nonrheumatic mitral valve regurgitation         Essential hypertension Blood pressure is well controlled on today's visit. No changes made to the medications.  Aortic atherosclerosis Mild in nature, noted on CT scan images Cholesterol low, does not appear to be on medication Images discussed.  No further work-up needed at this time  Hypertension well controlled,  no changes to her medications  Shortness of breath Recommended exercise program  GERD On PPI  Abnormal EKG Noted previously Discussed with her, going back several years Prior stress testing   Total encounter time more than 25 minutes  Greater than 50% was spent in counseling and coordination of care with the patient   No orders of the defined types were placed in this encounter.    Signed, Esmond Plants, M.D., Ph.D. 12/31/2020  [place], [place]

## 2021-01-01 DIAGNOSIS — G5701 Lesion of sciatic nerve, right lower limb: Secondary | ICD-10-CM | POA: Insufficient documentation

## 2021-01-01 DIAGNOSIS — H43813 Vitreous degeneration, bilateral: Secondary | ICD-10-CM | POA: Diagnosis not present

## 2021-01-01 DIAGNOSIS — M47816 Spondylosis without myelopathy or radiculopathy, lumbar region: Secondary | ICD-10-CM | POA: Diagnosis not present

## 2021-01-16 ENCOUNTER — Other Ambulatory Visit: Payer: Self-pay | Admitting: Gastroenterology

## 2021-01-16 DIAGNOSIS — K760 Fatty (change of) liver, not elsewhere classified: Secondary | ICD-10-CM

## 2021-01-16 DIAGNOSIS — K824 Cholesterolosis of gallbladder: Secondary | ICD-10-CM

## 2021-01-23 DIAGNOSIS — E7800 Pure hypercholesterolemia, unspecified: Secondary | ICD-10-CM | POA: Diagnosis not present

## 2021-01-23 DIAGNOSIS — I10 Essential (primary) hypertension: Secondary | ICD-10-CM | POA: Diagnosis not present

## 2021-01-23 DIAGNOSIS — K219 Gastro-esophageal reflux disease without esophagitis: Secondary | ICD-10-CM | POA: Diagnosis not present

## 2021-01-23 DIAGNOSIS — G4700 Insomnia, unspecified: Secondary | ICD-10-CM | POA: Diagnosis not present

## 2021-01-23 DIAGNOSIS — I272 Pulmonary hypertension, unspecified: Secondary | ICD-10-CM | POA: Diagnosis not present

## 2021-01-30 ENCOUNTER — Ambulatory Visit
Admission: RE | Admit: 2021-01-30 | Discharge: 2021-01-30 | Disposition: A | Discharge status: Home / Self Care | Payer: BC Managed Care – PPO | Source: Ambulatory Visit | Attending: Gastroenterology | Admitting: Gastroenterology

## 2021-01-30 DIAGNOSIS — K824 Cholesterolosis of gallbladder: Secondary | ICD-10-CM | POA: Diagnosis not present

## 2021-01-30 DIAGNOSIS — K760 Fatty (change of) liver, not elsewhere classified: Secondary | ICD-10-CM | POA: Diagnosis not present

## 2021-02-10 ENCOUNTER — Other Ambulatory Visit: Payer: Self-pay | Admitting: Internal Medicine

## 2021-02-10 ENCOUNTER — Telehealth: Payer: Self-pay | Admitting: Internal Medicine

## 2021-02-10 DIAGNOSIS — E042 Nontoxic multinodular goiter: Secondary | ICD-10-CM

## 2021-02-10 NOTE — Telephone Encounter (Signed)
Pt just wanted to remind Dr to set up an ultrasound for her thyroid nodules. Pt states Dr told her to let her know when it comes around her appt time

## 2021-02-10 NOTE — Telephone Encounter (Signed)
Done. C 

## 2021-02-13 DIAGNOSIS — K219 Gastro-esophageal reflux disease without esophagitis: Secondary | ICD-10-CM | POA: Diagnosis not present

## 2021-02-13 DIAGNOSIS — F5101 Primary insomnia: Secondary | ICD-10-CM | POA: Diagnosis not present

## 2021-02-13 DIAGNOSIS — E7800 Pure hypercholesterolemia, unspecified: Secondary | ICD-10-CM | POA: Diagnosis not present

## 2021-02-13 DIAGNOSIS — I10 Essential (primary) hypertension: Secondary | ICD-10-CM | POA: Diagnosis not present

## 2021-02-13 DIAGNOSIS — G2581 Restless legs syndrome: Secondary | ICD-10-CM | POA: Diagnosis not present

## 2021-02-13 DIAGNOSIS — R1013 Epigastric pain: Secondary | ICD-10-CM | POA: Diagnosis not present

## 2021-02-20 DIAGNOSIS — E871 Hypo-osmolality and hyponatremia: Secondary | ICD-10-CM | POA: Diagnosis not present

## 2021-02-24 ENCOUNTER — Ambulatory Visit
Admission: RE | Admit: 2021-02-24 | Discharge: 2021-02-24 | Disposition: A | Discharge status: Home / Self Care | Payer: Medicare Other | Source: Ambulatory Visit | Attending: Internal Medicine | Admitting: Internal Medicine

## 2021-02-24 DIAGNOSIS — E042 Nontoxic multinodular goiter: Secondary | ICD-10-CM

## 2021-02-24 DIAGNOSIS — M47816 Spondylosis without myelopathy or radiculopathy, lumbar region: Secondary | ICD-10-CM | POA: Diagnosis not present

## 2021-02-24 DIAGNOSIS — E041 Nontoxic single thyroid nodule: Secondary | ICD-10-CM | POA: Diagnosis not present

## 2021-03-05 DIAGNOSIS — N390 Urinary tract infection, site not specified: Secondary | ICD-10-CM | POA: Diagnosis not present

## 2021-03-06 DIAGNOSIS — K8020 Calculus of gallbladder without cholecystitis without obstruction: Secondary | ICD-10-CM | POA: Diagnosis not present

## 2021-03-14 ENCOUNTER — Ambulatory Visit: Payer: Medicare Other | Admitting: Internal Medicine

## 2021-03-17 ENCOUNTER — Encounter: Payer: Self-pay | Admitting: Podiatry

## 2021-03-17 ENCOUNTER — Other Ambulatory Visit: Payer: Self-pay

## 2021-03-17 ENCOUNTER — Ambulatory Visit: Payer: Medicare Other | Admitting: Podiatry

## 2021-03-17 DIAGNOSIS — Q828 Other specified congenital malformations of skin: Secondary | ICD-10-CM

## 2021-03-17 DIAGNOSIS — M7752 Other enthesopathy of left foot: Secondary | ICD-10-CM | POA: Diagnosis not present

## 2021-03-17 NOTE — Progress Notes (Signed)
  Subjective:  Patient ID: Amy Vang, female    DOB: Sep 19, 1946,  MRN: [id number]  Chief Complaint  Patient presents with  . Callouses    Patients back to office for painful callous lesion bottom of left sub 5th x 3-4 weeks    75 y.o. female presents with the above complaint. History confirmed with patient.  She had the lesion trimmed and an injection with Dr. Milinda Vang 6 months ago and that was helpful  Objective:  Physical Exam: warm, good capillary refill, no trophic changes or ulcerative lesions, normal DP and PT pulses and normal sensory exam. Left Foot: Porokeratosis submetatarsal 5 with erythema and fluctuant bursa underneath.  Tailor bunion deformity Assessment:  No diagnosis found.   Plan:  Patient was evaluated and treated and all questions answered.  Discussed etiology treatment options.  Recommended injection of the bursa today, using ethyl chloride and following sterile prep with alcohol I injected 5 mg of Marcaine 2 mg of dexamethasone and 5 mg of Kenalog into the bursa.  She tolerated this well.  I then debrided the lesion and destroyed with a chisel blade and salinocaine ointment.  Return if symptoms worsen or fail to improve.

## 2021-03-18 ENCOUNTER — Telehealth: Payer: Self-pay | Admitting: Internal Medicine

## 2021-03-18 NOTE — Telephone Encounter (Signed)
Ok to switch to VV?

## 2021-03-18 NOTE — Telephone Encounter (Signed)
Pt is wondering for her app 03/20/2021 at 1:40 pm if this visit can be virtual due to a 2 hour round trip to get to the office.    Pt would like a call back

## 2021-03-18 NOTE — Telephone Encounter (Signed)
Ok to switch appt to VV. Pt notified via MyChart message

## 2021-03-18 NOTE — Telephone Encounter (Signed)
OK if we can have good connection

## 2021-03-20 ENCOUNTER — Telehealth (INDEPENDENT_AMBULATORY_CARE_PROVIDER_SITE_OTHER): Payer: Medicare Other | Admitting: Internal Medicine

## 2021-03-20 ENCOUNTER — Other Ambulatory Visit: Payer: Self-pay | Admitting: General Surgery

## 2021-03-20 ENCOUNTER — Other Ambulatory Visit: Payer: Self-pay

## 2021-03-20 ENCOUNTER — Encounter: Payer: Self-pay | Admitting: Internal Medicine

## 2021-03-20 DIAGNOSIS — E042 Nontoxic multinodular goiter: Secondary | ICD-10-CM | POA: Diagnosis not present

## 2021-03-20 DIAGNOSIS — J209 Acute bronchitis, unspecified: Secondary | ICD-10-CM | POA: Diagnosis not present

## 2021-03-20 DIAGNOSIS — J3089 Other allergic rhinitis: Secondary | ICD-10-CM | POA: Diagnosis not present

## 2021-03-20 DIAGNOSIS — N390 Urinary tract infection, site not specified: Secondary | ICD-10-CM | POA: Diagnosis not present

## 2021-03-20 DIAGNOSIS — R053 Chronic cough: Secondary | ICD-10-CM | POA: Diagnosis not present

## 2021-03-20 NOTE — Progress Notes (Addendum)
Surgical Instructions    Your procedure is scheduled on Tuesday, 03/25/2021.  Report to [facility] Main Entrance "A" at 05:30 A.M., then check in with the Admitting office.   Call this number if you have problems the morning of surgery:  [phone]    If you have any questions prior to your surgery date call [phone]: Open Monday-Friday 8am-4pm    Remember:  Do not eat after midnight the night before your surgery  You may drink clear liquids until 04:30 the morning of your surgery.   Clear liquids allowed are: Water, Non-Citrus Juices (without pulp), Carbonated Beverages, Clear Tea, Black Coffee Only, and Gatorade  Please complete your PRE-SURGERY ENSURE that was provided to you by 04:30am the morning of surgery.  Please, if able, drink it in one sitting. DO NOT SIP.     Take these medicines the morning of surgery with A SIP OF: Citalopram (Celexa) Fexofenadine (Allegra) Pantoprazole (Protonix)  If needed you may take: Cyclobenzoprine (Flexeril) Fluticasone (Flonase) Hydrocodone-acetaminophen (Robaxin)   As of today, STOP taking any Aspirin or aspirin-containing products (unless otherwise instructed by your surgeon), Aleve, Naproxen, Ibuprofen, Motrin, Advil, Goody's, BC's, all herbal medications, supplements, fish oil, and all vitamins.          Do NOT Smoke (Tobacco/Vaping) or drink Alcohol 24 hours prior to your procedure  If you use a CPAP at night, you may bring all equipment for your overnight stay.   Contacts, glasses, hearing aids, dentures or partials may not be worn into surgery, please bring cases for these belongings   For patients admitted to the hospital, discharge time will be determined by your treatment team.   Patients discharged the day of surgery will not be allowed to drive home, and someone needs to stay with them for 24 hours.    Special instructions:   [name]- Preparing For Surgery  Before surgery, you can play an important role.  Because skin is not sterile, your skin needs to be as free of germs as possible. You can reduce the number of germs on your skin by washing with CHG (chlorahexidine gluconate) Soap before surgery.  CHG is an antiseptic cleaner which kills germs and bonds with the skin to continue killing germs even after washing.    Oral Hygiene is also important to reduce your risk of infection.  Remember - BRUSH YOUR TEETH THE MORNING OF SURGERY WITH YOUR REGULAR TOOTHPASTE  Please do not use if you have an allergy to CHG or antibacterial soaps. If your skin becomes reddened/irritated stop using the CHG.  Do not shave (including legs and underarms) for at least 48 hours prior to first CHG shower. It is OK to shave your face.  Please follow these instructions carefully.   You are going to shower with CHG soap 2 different times.  The NIGHT BEFORE SURGERY/PROCEDURE and then again the MORNING OF SURGERY/PROCEDURE   1. If you chose to wash your hair, wash your hair first as usual with your normal shampoo.  2. After you shampoo, rinse your hair and body thoroughly to remove the shampoo.  3. Wash Face and genitals (private parts) with your normal soap.   4. THEN Shower with CHG Soap.   5. Use CHG as you would any other liquid soap. You can apply CHG directly to the skin and wash gently with a pouf/sponge or a clean washcloth.   6. Apply the CHG Soap to your body ONLY FROM THE NECK DOWN.  Do not use on  open wounds or open sores. Avoid contact with your eyes, ears, mouth and genitals (private parts). Wash Face and genitals (private parts)  with your normal soap.   7. Wash thoroughly, paying special attention to the area where your surgery will be performed.  8. Thoroughly rinse your body with warm water from the neck down.  9. DO NOT shower/wash with your normal soap after using and rinsing off the CHG Soap.  10. Pat yourself dry with a CLEAN TOWEL.  11. Wear CLEAN PAJAMAS to bed the night before  surgery  12. Place CLEAN SHEETS on your bed the night before your surgery  13. DO NOT SLEEP WITH PETS.   Day of Surgery: Shower with CHG soap as directed.  Do not shave 48 hours prior to surgery.  Men may shave face and neck.  Do not wear lotions, powders, perfumes/colognes, or deodorant.  Wear Clean/Comfortable clothing the morning of surgery  Remember to brush your teeth WITH YOUR REGULAR TOOTHPASTE.             Do not wear jewelry, make up, or nail polish  Do not bring valuables to the hospital.             [facility] is not responsible for any belongings or valuables.    Please read over the following fact sheets that you were given.

## 2021-03-20 NOTE — Progress Notes (Signed)
Patient ID: Amy Vang, female   DOB: 02/03/1946, 75 y.o.   MRN: [id number]   Patient location: Home My location: Office Persons participating in the virtual visit: patient, provider  Referring Provider: Carol Ada, MD  I connected with the patient on 03/20/21 at  1:42 PM EDT by a video enabled telemedicine application and verified that I am speaking with the correct person.   I discussed the limitations of evaluation and management by telemedicine and the availability of in person appointments. The patient expressed understanding and agreed to proceed.   Details of the encounter are shown below.  HPI  Amy Vang is a 75 y.o.-year-old female, initially referred by Amy. Tamala Vang, presenting for follow-up for thyroid nodules.  She wanted to have a virtual visit today as she lives far away.  Interim history: She describes congestion but also she has been having lower chest pain, which is considered to be related to her gallbladder.  She is preparing for gallbladder surgery later this month.  Reviewed and addended history: She was found to have a thyroid nodule on recent spine x-rays.  PCP ordered a thyroid ultrasound.  Records reviewed, but images not available:  Thyroid U/S (01/11/2020, [facility]): Normal-sized thyroid, mildly heterogeneous, with 4 thyroid nodules:  Right mid nodule measuring 1.3 x 1.3 x 1.1 cm, spongiform-no follow-up needed  Right inferior nodule measuring 1 x 0.9 x 0.8 cm, solid, hypoechoic-follow-up in 1 year  Isthmic nodule measuring 1.2 x 0.8 x 0.8 cm, solid, hypoechoic-follow-up in 1 year  Left inferior nodule 0.8 x 0.6 x 0.4 cm, partially cystic-no follow-up needed  Thyroid U/S (02/24/2021): 2 dominant thyroid nodules:  Isthmic nodule, measuring 1.7 x 1.3 x 0.4 cm, solid, hypoechoic, TR 4 >> biopsy recommended  Right mid nodule, measuring 1.5 x 1.4 x 1.0 cm, TR 3  -follow-up in 1 year recommended  Pt denies: - feeling nodules in neck  -  hoarseness - dysphagia - choking - SOB with lying down + Occasional cough after eating -with dry foods-chronic.  Of note, the patient had a barium swallow (10/30/2011): Moderate esophageal dysmotility, likely presbyesophagus, small hiatal hernia, suspicion for GERD. Sees Amy. Watt Vang.  I reviewed pt's thyroid tests: 11/14/2020: TSH 1.17 10/31/2019: TSH 1.32 09/08/2018: TSH 1.92 Lab Results  Component Value Date   TSH 1.46 04/22/2016   TSH 1.30 03/06/2015    At last visit, she described: - + anxiety/depression - on Celexa - + Nausea, hyperdefecation/constipation-occasionally. These have improved.  + FH of thyroid ds. - sister and niece.  No FH of thyroid cancer. No h/o radiation tx to head or neck.  No steroid use. No herbal supplements. No Biotin supplements or Hair, Skin and Nails vitamins.  Pt also has a history of GERD - on Prptonix now, severe OSA- on CPAP, COVID-19 08/2019.  ROS: Constitutional: no weight gain/no weight loss, no fatigue, no subjective hyperthermia, no subjective hypothermia Eyes: no blurry vision, no xerophthalmia ENT: no sore throat, + see HPI, + congestion Cardiovascular: + CP/no SOB/no palpitations/no leg swelling Respiratory: no cough/no SOB/no wheezing Gastrointestinal: no N/no V/no D/no C/no acid reflux Musculoskeletal: no muscle aches/no joint aches Skin: no rashes, no hair loss Neurological: no tremors/no numbness/no tingling/no dizziness  I reviewed pt's medications, allergies, PMH, social hx, family hx, and changes were documented in the history of present illness. Otherwise, unchanged from my initial visit note.  Past Medical History:  Diagnosis Date  . Allergic rhinitis   . Anxiety   . Chronic constipation   .  COVID-19 08/2019  . Degenerative disc disease, cervical   . Essential hypertension   . GERD (gastroesophageal reflux disease)   . Hiatal hernia   . History of echocardiogram    in epic 04-21-2019,  normal w/ ef 60-65% and mild  mitral regurg, no stenosis or restriction  . History of TIAs    per pt age 75;  2008;  05/ 2013;  pt stated no residual was from sleep doctor more than likely sleep deprived than actual tia  . Motion sickness    cars  . Nocturia   . OSA on CPAP    Severe ,, CPAP 12 MM H2O- Amy Vang  . PONV (postoperative nausea and vomiting)   . Restless leg syndrome   . Rosacea   . Seasonal asthma    no inhaler   Past Surgical History:  Procedure Laterality Date  . ANTERIOR CERVICAL DECOMP/DISCECTOMY FUSION N/A 10/25/2017   Procedure: CERVICAL SIX- CERVICAL SEVEN ANTERIOR CERVICAL DECOMPRESSION/DISCECTOMY FUSION, INTERBODY PROSTHESIS AND ANTERIOR PLATING;  Surgeon: Amy Pies, MD;  Location: [place];  Service: Neurosurgery;  Laterality: N/A;  CERVICAL 6- CERVICAL 7 ANTERIOR CERVICAL DECOMPRESSION/DISCECTOMY FUSION, INTERBODY PROSTHESIS AND ANTERIOR PLATING  . BILATERAL BENIGN BREAST BX'S  20 YRS AGO  . BREAST BIOPSY Right    benign  . BREAST EXCISIONAL BIOPSY Bilateral over 10 years ago   benign  . CARDIAC CATHETERIZATION N/A 08/13/2016   Procedure: Right Heart Cath;  Surgeon: Amy Dresser, MD;  Location: [place] [facility];  Service: Cardiovascular;  Laterality: N/A;    normal study, no evidence pulm HTN  . CARPAL TUNNEL RELEASE  2009   BILATERAL  . CATARACT EXTRACTION W/PHACO Right 02/27/2020   Procedure: CATARACT EXTRACTION PHACO AND INTRAOCULAR LENS PLACEMENT (IOC) RIGHT 3.60 00:23.9;  Surgeon: Amy Robson, MD;  Location: [place];  Service: Ophthalmology;  Laterality: Right;  sleep apnea  . CATARACT EXTRACTION W/PHACO Left 05/21/2020   Procedure: CATARACT EXTRACTION PHACO AND INTRAOCULAR LENS PLACEMENT (IOC) LEFT 3.69 00:30.9;  Surgeon: Amy Robson, MD;  Location: [place];  Service: Ophthalmology;  Laterality: Left;  sleep apnea  . CYSTOCELE REPAIR  12/11/2011   Procedure: ANTERIOR REPAIR (CYSTOCELE);  Surgeon: Amy Jabs, MD;  Location: [facility];  Service: Urology;  Laterality: N/A;  1 1/2 hour requested for case  Anterior repair and mid Urethral Sling  . MASS EXCISION N/A 07/24/2019   Procedure: EXCISION OF VAGINAL FOREIGN BODY;  Surgeon: Amy Rhodes, MD;  Location: [facility];  Service: Urology;  Laterality: N/A;  . NASAL SEPTOPLASTY W/ TURBINOPLASTY Bilateral 11/18/2016   Procedure: NASAL SEPTOPLASTY WITH BILATERAL TURBINATE REDUCTION;  Surgeon: Jerrell Belfast, MD;  Location: [place];  Service: ENT;  Laterality: Bilateral;  . NONINVASIVE VASCULAR CAROTID STUDY  09-14-2007   BILATERAL MILD MIX PLAQUE THROUGHOUT, NO SIGNIFICANT BILATERAL ICA STENOSIS  . PUBOVAGINAL SLING  12/11/2011   Procedure: Gaynelle Arabian;  Surgeon: Amy Jabs, MD;  Location: [facility];  Service: Urology;  Laterality: N/A;  . PULLEY RELEASE RIGHT THUMB  2009  . SHOULDER ARTHROSCOPY DISTAL CLAVICLE EXCISION AND OPEN ROTATOR CUFF REPAIR Right 01-17-2019   Amy supple _0   . SINUS ENDO WITH FUSION Bilateral 11/18/2016   Procedure: BILATERAL ENDOSCOPIC SINUS SURGERY;  Surgeon: Jerrell Belfast, MD;  Location: [place];  Service: ENT;  Laterality: Bilateral;  . VAGINAL HYSTERECTOMY  AGE 33   Social History   Socioeconomic History  . Marital status: Married  Spouse name: Not on file  . Number of children: 2  . Years of education: Not on file  . Highest education level: Not on file  Occupational History  .  Retired Web designer  Tobacco Use  . Smoking status: Never Smoker  . Smokeless tobacco: Never Used  Substance and Sexual Activity  . Alcohol use: No  . Drug use: No  . Sexual activity: Not on file  Other Topics Concern  . Not on file  Social History Narrative   Lives with husband.    Retired Web designer   Has 2 children.   Social Determinants of Health   Financial Resource Strain:   . Difficulty of Paying Living Expenses:   Food Insecurity:   . Worried About Paediatric nurse in the Last Year:   . Arboriculturist in the Last Year:   Transportation Needs:   . Film/video editor (Medical):   Marland Kitchen Lack of Transportation (Non-Medical):   Physical Activity:   . Days of Exercise per Week:   . Minutes of Exercise per Session:   Stress:   . Feeling of Stress :   Social Connections:   . Frequency of Communication with Friends and Family:   . Frequency of Social Gatherings with Friends and Family:   . Attends Religious Services:   . Active Member of Clubs or Organizations:   . Attends Archivist Meetings:   Marland Kitchen Marital Status:   Intimate Partner Violence:   . Fear of Current or Ex-Partner:   . Emotionally Abused:   Marland Kitchen Physically Abused:   . Sexually Abused:    Current Outpatient Medications on File Prior to Visit  Medication Sig Dispense Refill  . acetaminophen (TYLENOL) 650 MG CR tablet Take 1,300 mg by mouth at bedtime.    . citalopram (CELEXA) 20 MG tablet Take 20 mg by mouth daily.  2  . clonazePAM (KLONOPIN) 0.5 MG tablet Take 0.5 mg by mouth at bedtime.    . cyclobenzaprine (FLEXERIL) 10 MG tablet Take 1 tablet (10 mg total) by mouth 3 (three) times daily as needed for muscle spasms. 50 tablet 1  . dicyclomine (BENTYL) 20 MG tablet Take 20 mg by mouth 4 (four) times daily as needed for spasms.    . famotidine (PEPCID) 40 MG tablet Take 40 mg by mouth at bedtime.    . fexofenadine (ALLEGRA) 180 MG tablet Take 180 mg by mouth daily.    . fluticasone (FLONASE) 50 MCG/ACT nasal spray Place 2 sprays into both nostrils 2 (two) times daily as needed for allergies.    Marland Kitchen guaiFENesin (MUCINEX) 600 MG 12 hr tablet Take 600 mg by mouth 2 (two) times daily as needed for to loosen phlegm or cough.    . hydrochlorothiazide (HYDRODIURIL) 25 MG tablet Take 1 tablet (25 mg total) by mouth daily.    Marland Kitchen HYDROcodone-acetaminophen (NORCO/VICODIN) 5-325 MG tablet Take 1 tablet by mouth 4 (four) times daily as needed for moderate pain.    . methocarbamol  (ROBAXIN) 500 MG tablet Take 500 mg by mouth every 6 (six) hours as needed for muscle spasms.    . NON FORMULARY     . pantoprazole (PROTONIX) 40 MG tablet Take 40 mg by mouth 2 (two) times daily.    . polyethylene glycol (MIRALAX / GLYCOLAX) packet Take 17 g by mouth daily as needed for mild constipation.     . potassium chloride (MICRO-K) 10 MEQ CR capsule Take 10 mEq by  mouth 2 (two) times daily.     . Probiotic Product (ALIGN PO) Take 1 capsule by mouth daily.    . traZODone (DESYREL) 50 MG tablet Take 37.5 mg by mouth at bedtime.     No current facility-administered medications on file prior to visit.   Allergies  Allergen Reactions  . Penicillins Hives, Rash and Other (See Comments)    Patient has had a PCN reaction with immediate rash, facial/tongue/throat swelling, SOB, and or lightheadedness with hypotension; Has also developed severe rash involving mucus membranes or skin necrosis.   Has patient had a PCN reaction that required hospitalization No Has patient had a PCN reaction occurring within the last 10 years: No If all of the above answers are "NO", then may proceed with Cephalosporin use.   Marland Kitchen Hydroxyzine Hcl     UNSPECIFIED REACTION   . Promethazine Hcl     Other reaction(s): hyperactivity  . Ciprofloxacin Anxiety  . Lactose Intolerance (Gi) Other (See Comments)    bloating  . Septra [Sulfamethoxazole-Trimethoprim] Rash   Family History  Adopted: Yes  Problem Relation Age of Onset  . Diabetes Mother   . Stroke Mother   . Alzheimer's disease Mother   . Hypertension Sister   . Diabetes Sister   . Heart attack Brother   . Leukemia Brother   . CAD Brother   . Diabetes Brother   . Colon cancer Maternal Aunt   . Stroke Paternal Grandmother   . Heart attack Paternal Grandfather    PE: There were no vitals taken for this visit. Wt Readings from Last 3 Encounters:  12/31/20 177 lb (80.3 kg)  05/21/20 174 lb (78.9 kg)  03/11/20 178 lb (80.7 kg)   Constitutional:   in NAD  The physical exam was not performed (virtual visit).  ASSESSMENT: 1. Thyroid nodules  PLAN: 1. Thyroid nodules -Patient with a history of for thyroid nodules detected on ultrasound in 01/2020.  2 of the nodules are spongiform, partly cystic, with no follow-up needed, however, the other 2 were at slightly higher risk, being solid and hypoechoic and follow-up in 1 year was recommended.  These nodules, were, however, quite small, of 1, and 1.2 cm, respectively, in the largest dimension. -At last visit, we discussed about the fact that these nodules were low risk and I explained the evaluation process. We decided to continue to follow them expectantly, without intervention. -Since last visit, we obtained another thyroid ultrasound last month and this showed that the isthmic nodule now met criteria for biopsy and it had increased in size.  At this visit, we discussed about having this nodule biopsied and she agrees with the plan.  The other, right mid thyroid nodule, is also slightly larger, but not worrisome so we can continue to just follow it with another ultrasound in a year. -She does not have a thyroid cancer family history or personal history of radiation therapy to head or neck, all of these favoring benignity -We discussed that even though the nodules appear to be slightly larger, this does not necessarily mean malignancy, even benign nodules can increase in size over time. -For now, we will proceed with a biopsy of the isthmic nodule and if benign, I will see her back in another year, when we will repeat her thyroid ultrasound.  If the nodule is malignant, I will refer her to surgery.  If the biopsy is inconclusive, we will run the Afirma molecular marker and she may need a repeat biopsy in 6  months to a year. -She denies neck compression symptoms -we reviewed together her latest TSH from 10/2020 and this was normal, at 1.17. -RTC in 1 year, but possibly sooner, depending on the above  evaluation  Philemon Kingdom, MD PhD [facility] Endocrinology

## 2021-03-20 NOTE — Patient Instructions (Addendum)
Please come back in 1 year.  Please expect a call from [facility] radiology about scheduling your thyroid biopsy.  Let me know to order another U/S ~3 weeks before our next visit.

## 2021-03-21 ENCOUNTER — Encounter (HOSPITAL_COMMUNITY): Payer: Self-pay

## 2021-03-21 ENCOUNTER — Other Ambulatory Visit (HOSPITAL_COMMUNITY): Payer: Medicare Other

## 2021-03-21 ENCOUNTER — Other Ambulatory Visit: Payer: Self-pay

## 2021-03-21 ENCOUNTER — Encounter (HOSPITAL_COMMUNITY)
Admission: RE | Admit: 2021-03-21 | Discharge: 2021-03-21 | Disposition: A | Discharge status: Home / Self Care | Payer: Medicare Other | Source: Ambulatory Visit | Attending: General Surgery | Admitting: General Surgery

## 2021-03-21 DIAGNOSIS — Z01812 Encounter for preprocedural laboratory examination: Secondary | ICD-10-CM | POA: Diagnosis not present

## 2021-03-21 DIAGNOSIS — Z20822 Contact with and (suspected) exposure to covid-19: Secondary | ICD-10-CM | POA: Diagnosis not present

## 2021-03-21 HISTORY — DX: Anemia, unspecified: D64.9

## 2021-03-21 HISTORY — DX: Personal history of other diseases of the respiratory system: Z87.09

## 2021-03-21 LAB — BASIC METABOLIC PANEL
Anion gap: 8 (ref 5–15)
BUN: 9 mg/dL (ref 8–23)
CO2: 27 mmol/L (ref 22–32)
Calcium: 9.4 mg/dL (ref 8.9–10.3)
Chloride: 97 mmol/L — ABNORMAL LOW (ref 98–111)
Creatinine, Ser: 0.68 mg/dL (ref 0.44–1.00)
GFR, Estimated: >60 mL/min (ref >60)
Glucose, Bld: 136 mg/dL — ABNORMAL HIGH (ref 70–99)
Potassium: 3.7 mmol/L (ref 3.5–5.1)
Sodium: 132 mmol/L — ABNORMAL LOW (ref 135–145)

## 2021-03-21 LAB — CBC
HCT: 36.7 % (ref 36.0–46.0)
Hemoglobin: 12.6 g/dL (ref 12.0–15.0)
MCH: 30.7 pg (ref 26.0–34.0)
MCHC: 34.3 g/dL (ref 30.0–36.0)
MCV: 89.3 fL (ref 80.0–100.0)
Platelets: 274 10*3/uL (ref 150–400)
RBC: 4.11 MIL/uL (ref 3.87–5.11)
RDW: 13 % (ref 11.5–15.5)
WBC: 12.2 10*3/uL — ABNORMAL HIGH (ref 4.0–10.5)
nRBC: 0 % (ref 0.0–0.2)

## 2021-03-21 LAB — SARS CORONAVIRUS 2 (TAT 6-24 HRS): SARS Coronavirus 2: NEGATIVE

## 2021-03-21 NOTE — Progress Notes (Signed)
PCP - Dr. Carol Ada, MD Cardiologist - Dr. Ida Rogue, MD  PPM/ICD - n/a Device Orders -  Rep Notified -   Chest x-ray - n/a EKG - 12/31/20 Stress Test - 04/30/2016 ECHO - 04/21/2019 Cardiac Cath - 08/13/2016  Sleep Study - patient states it was in 2012 or 2013 but not sure where CPAP - yes  Fasting Blood Sugar - n/a Checks Blood Sugar _____ times a day  Blood Thinner Instructions: n/a Aspirin Instructions: n/a  ERAS Protcol - clears until 0430 PRE-SURGERY Ensure or G2- Ensure given   COVID TEST- at PAT appointment 03/21/21   Anesthesia review: yes, history of cardiac testing  Patient denies shortness of breath, fever, cough and chest pain at PAT appointment   All instructions explained to the patient, with a verbal understanding of the material. Patient agrees to go over the instructions while at home for a better understanding. Patient also instructed to self quarantine after being tested for COVID-19. The opportunity to ask questions was provided.

## 2021-03-24 DIAGNOSIS — G4733 Obstructive sleep apnea (adult) (pediatric): Secondary | ICD-10-CM | POA: Diagnosis not present

## 2021-03-24 NOTE — Progress Notes (Signed)
Anesthesia Chart Review:  Case: [id number] Date/Time: 03/25/21 0715   Procedure: LAPAROSCOPIC CHOLECYSTECTOMY WITH INTRAOPERATIVE CHOLANGIOGRAM (N/A ) - 90/RM1   Anesthesia type: General   Pre-op diagnosis: GALLSTONES   Location: [place] OR ROOM 01 / [place] OR   Surgeons: Rolm Bookbinder, MD      DISCUSSION: Patient is a 75 year old female scheduled for the above procedure.   History includes never smoker, post-operative N/V, OSA (uses CPAP), HTN, GERD, hiatal hernia, anemia, asthma ("seasonal"), TIA (?age 73), anxiety, rosacea, COVID-19 (08/2019), thyroid nodules.  12 month follow-up cardiology visit with Dr. Rockey Situ on 12/31/20. He notes lateral negative T waves chronic with prior negative stress test in 2017. She has had work-up for chronic dyspnea (most studies done in 2017) including CTA chest showing no PE or ILD, normal PFTs except decreased DLCO, non-ischemic Cardiolite, near normal CPX primarily limited by body habitus, normal RHC (with pulmonary HTN by previous echo), and most recently a 2020 echo showed normal LVEF, impaired LV relaxation, normal RVSF, mild MI/MR. She is using CPAP for OSA. He advised as needed cardiology follow-up.   03/21/21 presurgical COVID-19 test negative. Anesthesia team to evaluate on the day of surgery.   VS: BP (!) 149/71   Pulse 80   Temp 37.2 C (Oral)   Resp 18   Ht 5\' 5"  (1.651 m)   Wt 77.2 kg   SpO2 97%   BMI 28.31 kg/m    PROVIDERS: Carol Ada, MD is PCP  Ida Rogue, MD is cardiologist. As needed follow-up recommended at 12/31/20 visit. (She also saw [name], MD ~ [phone] for OSA.) Philemon Kingdom, MD is endocrinologist. Last visit 03/20/21 for thyroid nodules. TSH 1.17 11/14/20. Plan for biopsy isthmic nodule, and if benign follow-up in one year with repeat US. Jerrell Belfast, MD is ENT   LABS: Labs reviewed: Acceptable for surgery. (all labs ordered are listed, but only abnormal results are displayed)  Labs Reviewed  BASIC  METABOLIC PANEL - Abnormal; Notable for the following components:      Result Value   Sodium 132 (*)    Chloride 97 (*)    Glucose, Bld 136 (*)    All other components within normal limits  CBC - Abnormal; Notable for the following components:   WBC 12.2 (*)    All other components within normal limits  SARS CORONAVIRUS 2 (TAT 6-24 HRS)   Spirometry 04/16/16: FVC 2.85 (89%), FEV1 2.27 (94%), FEV1/FVC 80& (104%), DLCO unc 16.52 (64%).    IMAGES: [place] Abd 01/30/21: IMPRESSION: 1. Probable cholelithiasis now, with clustered subcentimeter echogenic foci with some shadowing. And this includes the previously seen 5-6 mm echogenic focus previously thought to be polyp, now more suggestive of adherent stone. No evidence of acute cholecystitis or bile duct obstruction. 2. Stable fatty liver disease.   EKG: 12/31/20 ([facility]): NSR, ST & T wave abnormality, consider lateral ischemia.    CV: Echo 04/21/19: IMPRESSIONS  1. The left ventricle has normal systolic function with an ejection  fraction of 60-65%. The cavity size was normal. There is mildly increased  left ventricular wall thickness. Left ventricular diastolic Doppler  parameters are consistent with impaired  relaxation.  2. The right ventricle has normal systolic function. The cavity was  normal. There is no increase in right ventricular wall thickness.Unable to  estimate RVSP  3. Aortic valve regurgitation is mild by color flow Doppler.  4. Mild mitral valve regurgitation    RHC 08/13/16: Conclusions: Normal study.  No evidence for  pulmonary hypertension and normal filling pressures.     CPX 05/26/16: Conclusion: Exercise testing with gas-exchange demonstrates  normal functional capacity when compared to matched sedentary  norms. There is no evidence of cardiopulmonary limitation or  exercise-induced bronchospasm. Patient appears primarily limited  due to her body habitus. There was also chronotropic   incompetence.    Nuclear stress test 04/30/16:  Nuclear stress EF: 72%.  There was no ST segment deviation noted during stress.  This is a low risk study.  The left ventricular ejection fraction is hyperdynamic (>65%). Breast artifact no ischemia or infarction EF normal 72% no RWMAls    Carotid US 04/12/12: Summary:  No significant extracranial carotid artery stenosis  demonstrated. Vertebrals are patent with antegrade flow.     Past Medical History:  Diagnosis Date  . Allergic rhinitis   . Anemia   . Anxiety   . Chronic constipation   . COVID-19 08/2019  . Degenerative disc disease, cervical   . Essential hypertension   . GERD (gastroesophageal reflux disease)   . H/O bronchitis   . Hiatal hernia   . History of echocardiogram    in epic 04-21-2019,  normal w/ ef 60-65% and mild mitral regurg, no stenosis or restriction  . History of TIAs    per pt age 56;  2008;  05/ 2013;  pt stated no residual was from sleep doctor more than likely sleep deprived than actual tia  . Motion sickness    cars  . Nocturia   . OSA on CPAP    Severe ,, CPAP 12 MM H2O- Dr Radford Pax  . PONV (postoperative nausea and vomiting)   . Restless leg syndrome   . Rosacea   . Seasonal asthma    no inhaler    Past Surgical History:  Procedure Laterality Date  . ANTERIOR CERVICAL DECOMP/DISCECTOMY FUSION N/A 10/25/2017   Procedure: CERVICAL SIX- CERVICAL SEVEN ANTERIOR CERVICAL DECOMPRESSION/DISCECTOMY FUSION, INTERBODY PROSTHESIS AND ANTERIOR PLATING;  Surgeon: Newman Pies, MD;  Location: [place];  Service: Neurosurgery;  Laterality: N/A;  CERVICAL 6- CERVICAL 7 ANTERIOR CERVICAL DECOMPRESSION/DISCECTOMY FUSION, INTERBODY PROSTHESIS AND ANTERIOR PLATING  . BILATERAL BENIGN BREAST BX'S  20 YRS AGO  . BREAST BIOPSY Right    benign  . BREAST EXCISIONAL BIOPSY Bilateral over 10 years ago   benign  . CARDIAC CATHETERIZATION N/A 08/13/2016   Procedure: Right Heart Cath;  Surgeon: Larey Dresser,  MD;  Location: [place] [facility];  Service: Cardiovascular;  Laterality: N/A;    normal study, no evidence pulm HTN  . CARPAL TUNNEL RELEASE  2009   BILATERAL  . CATARACT EXTRACTION W/PHACO Right 02/27/2020   Procedure: CATARACT EXTRACTION PHACO AND INTRAOCULAR LENS PLACEMENT (IOC) RIGHT 3.60 00:23.9;  Surgeon: Birder Robson, MD;  Location: [place];  Service: Ophthalmology;  Laterality: Right;  sleep apnea  . CATARACT EXTRACTION W/PHACO Left 05/21/2020   Procedure: CATARACT EXTRACTION PHACO AND INTRAOCULAR LENS PLACEMENT (IOC) LEFT 3.69 00:30.9;  Surgeon: Birder Robson, MD;  Location: [place];  Service: Ophthalmology;  Laterality: Left;  sleep apnea  . CYSTOCELE REPAIR  12/11/2011   Procedure: ANTERIOR REPAIR (CYSTOCELE);  Surgeon: Claybon Jabs, MD;  Location: [facility];  Service: Urology;  Laterality: N/A;  1 1/2 hour requested for case  Anterior repair and mid Urethral Sling  . MASS EXCISION N/A 07/24/2019   Procedure: EXCISION OF VAGINAL FOREIGN BODY;  Surgeon: Kathie Rhodes, MD;  Location: [place];  Service: Urology;  Laterality: N/A;  . NASAL SEPTOPLASTY W/ TURBINOPLASTY Bilateral 11/18/2016   Procedure: NASAL SEPTOPLASTY WITH BILATERAL TURBINATE REDUCTION;  Surgeon: Jerrell Belfast, MD;  Location: [place];  Service: ENT;  Laterality: Bilateral;  . NONINVASIVE VASCULAR CAROTID STUDY  09-14-2007   BILATERAL MILD MIX PLAQUE THROUGHOUT, NO SIGNIFICANT BILATERAL ICA STENOSIS  . PUBOVAGINAL SLING  12/11/2011   Procedure: Gaynelle Arabian;  Surgeon: Claybon Jabs, MD;  Location: [facility];  Service: Urology;  Laterality: N/A;  . PULLEY RELEASE RIGHT THUMB  2009  . SHOULDER ARTHROSCOPY DISTAL CLAVICLE EXCISION AND OPEN ROTATOR CUFF REPAIR Right 01-17-2019   dr supple @[facility]   . SINUS ENDO WITH FUSION Bilateral 11/18/2016   Procedure: BILATERAL ENDOSCOPIC SINUS SURGERY;  Surgeon: Jerrell Belfast, MD;  Location: [place];   Service: ENT;  Laterality: Bilateral;  . VAGINAL HYSTERECTOMY  AGE 71    MEDICATIONS: . HYDROcodone-homatropine (HYCODAN) 5-1.5 MG/5ML syrup  . ondansetron (ZOFRAN) 4 MG tablet  . promethazine (PHENERGAN) 25 MG tablet  . acetaminophen (TYLENOL) 650 MG CR tablet  . albuterol (VENTOLIN HFA) 108 (90 Base) MCG/ACT inhaler  . azithromycin (ZITHROMAX Z-PAK) 250 MG tablet  . CIPROFLOXACIN PO  . citalopram (CELEXA) 20 MG tablet  . clonazePAM (KLONOPIN) 0.5 MG tablet  . cyclobenzaprine (FLEXERIL) 10 MG tablet  . dicyclomine (BENTYL) 20 MG tablet  . famotidine (PEPCID) 40 MG tablet  . fexofenadine (ALLEGRA) 180 MG tablet  . fluticasone (FLONASE) 50 MCG/ACT nasal spray  . guaiFENesin (MUCINEX) 600 MG 12 hr tablet  . hydrochlorothiazide (HYDRODIURIL) 25 MG tablet  . HYDROcodone-acetaminophen (NORCO/VICODIN) 5-325 MG tablet  . methocarbamol (ROBAXIN) 500 MG tablet  . NON FORMULARY  . pantoprazole (PROTONIX) 40 MG tablet  . polyethylene glycol (MIRALAX / GLYCOLAX) packet  . potassium chloride (MICRO-K) 10 MEQ CR capsule  . Probiotic Product (ALIGN PO)  . traZODone (DESYREL) 50 MG tablet   No current facility-administered medications for this encounter.    Myra Gianotti, PA-C Surgical Short Stay/Anesthesiology [facility] Phone [phone] [facility] Phone [phone] 03/24/2021 10:36 AM

## 2021-03-24 NOTE — Anesthesia Preprocedure Evaluation (Addendum)
Anesthesia Evaluation  Patient identified by MRN, date of birth, ID band Patient awake    Reviewed: Allergy & Precautions, NPO status , Patient's Chart, lab work & pertinent test results  Airway Mallampati: II  TM Distance: >3 FB Neck ROM: Full    Dental  (+) Teeth Intact, Dental Advisory Given   Pulmonary    breath sounds clear to auscultation       Cardiovascular hypertension,  Rhythm:Regular Rate:Normal     Neuro/Psych    GI/Hepatic   Endo/Other    Renal/GU      Musculoskeletal   Abdominal   Peds  Hematology   Anesthesia Other Findings   Reproductive/Obstetrics                            Anesthesia Physical Anesthesia Plan  ASA: III  Anesthesia Plan: General   Post-op Pain Management:    Induction: Intravenous  PONV Risk Score and Plan: Ondansetron, Dexamethasone and Scopolamine patch - Pre-op  Airway Management Planned: Oral ETT  Additional Equipment:   Intra-op Plan:   Post-operative Plan:   Informed Consent: I have reviewed the patients History and Physical, chart, labs and discussed the procedure including the risks, benefits and alternatives for the proposed anesthesia with the patient or authorized representative who has indicated his/her understanding and acceptance.     Dental advisory given  Plan Discussed with: CRNA and Anesthesiologist  Anesthesia Plan Comments: (PAT note written 03/24/2021 by Myra Gianotti, PA-C. H/O chronic dyspnea Nuclear stress test (-) for ischemia, PFTs OK TIA OSA- CPAP H/O PONV Htn S/P ACDF  Plan GA with Oral ETT )       Anesthesia Quick Evaluation

## 2021-03-25 ENCOUNTER — Other Ambulatory Visit: Payer: Self-pay

## 2021-03-25 ENCOUNTER — Ambulatory Visit (HOSPITAL_COMMUNITY): Payer: Medicare Other

## 2021-03-25 ENCOUNTER — Ambulatory Visit (HOSPITAL_COMMUNITY): Payer: Medicare Other | Admitting: Vascular Surgery

## 2021-03-25 ENCOUNTER — Encounter (HOSPITAL_COMMUNITY)
Admission: RE | Disposition: A | Discharge status: Home / Self Care | Payer: Self-pay | Source: Home / Self Care | Attending: General Surgery

## 2021-03-25 ENCOUNTER — Encounter (HOSPITAL_COMMUNITY): Payer: Self-pay | Admitting: General Surgery

## 2021-03-25 ENCOUNTER — Ambulatory Visit (HOSPITAL_COMMUNITY): Payer: Medicare Other | Admitting: Anesthesiology

## 2021-03-25 ENCOUNTER — Ambulatory Visit (HOSPITAL_COMMUNITY)
Admission: RE | Admit: 2021-03-25 | Discharge: 2021-03-25 | Disposition: A | Discharge status: Home / Self Care | Payer: Medicare Other | Attending: General Surgery | Admitting: General Surgery

## 2021-03-25 DIAGNOSIS — Z820 Family history of epilepsy and other diseases of the nervous system: Secondary | ICD-10-CM | POA: Insufficient documentation

## 2021-03-25 DIAGNOSIS — Z881 Allergy status to other antibiotic agents status: Secondary | ICD-10-CM | POA: Insufficient documentation

## 2021-03-25 DIAGNOSIS — Z8249 Family history of ischemic heart disease and other diseases of the circulatory system: Secondary | ICD-10-CM | POA: Diagnosis not present

## 2021-03-25 DIAGNOSIS — Z823 Family history of stroke: Secondary | ICD-10-CM | POA: Insufficient documentation

## 2021-03-25 DIAGNOSIS — Z800 Family history of malignant neoplasm of digestive organs: Secondary | ICD-10-CM | POA: Diagnosis not present

## 2021-03-25 DIAGNOSIS — K429 Umbilical hernia without obstruction or gangrene: Secondary | ICD-10-CM | POA: Diagnosis not present

## 2021-03-25 DIAGNOSIS — Z880 Allergy status to penicillin: Secondary | ICD-10-CM | POA: Insufficient documentation

## 2021-03-25 DIAGNOSIS — K219 Gastro-esophageal reflux disease without esophagitis: Secondary | ICD-10-CM | POA: Insufficient documentation

## 2021-03-25 DIAGNOSIS — Z882 Allergy status to sulfonamides status: Secondary | ICD-10-CM | POA: Diagnosis not present

## 2021-03-25 DIAGNOSIS — Z833 Family history of diabetes mellitus: Secondary | ICD-10-CM | POA: Diagnosis not present

## 2021-03-25 DIAGNOSIS — Z419 Encounter for procedure for purposes other than remedying health state, unspecified: Secondary | ICD-10-CM

## 2021-03-25 DIAGNOSIS — G4733 Obstructive sleep apnea (adult) (pediatric): Secondary | ICD-10-CM | POA: Diagnosis not present

## 2021-03-25 DIAGNOSIS — E739 Lactose intolerance, unspecified: Secondary | ICD-10-CM | POA: Diagnosis not present

## 2021-03-25 DIAGNOSIS — K8010 Calculus of gallbladder with chronic cholecystitis without obstruction: Secondary | ICD-10-CM | POA: Insufficient documentation

## 2021-03-25 DIAGNOSIS — K8020 Calculus of gallbladder without cholecystitis without obstruction: Secondary | ICD-10-CM | POA: Diagnosis not present

## 2021-03-25 DIAGNOSIS — Z8616 Personal history of COVID-19: Secondary | ICD-10-CM | POA: Insufficient documentation

## 2021-03-25 DIAGNOSIS — I10 Essential (primary) hypertension: Secondary | ICD-10-CM | POA: Diagnosis not present

## 2021-03-25 DIAGNOSIS — Z9989 Dependence on other enabling machines and devices: Secondary | ICD-10-CM | POA: Diagnosis not present

## 2021-03-25 DIAGNOSIS — Z79899 Other long term (current) drug therapy: Secondary | ICD-10-CM | POA: Diagnosis not present

## 2021-03-25 HISTORY — PX: UMBILICAL HERNIA REPAIR: SHX196

## 2021-03-25 HISTORY — PX: CHOLECYSTECTOMY: SHX55

## 2021-03-25 SURGERY — LAPAROSCOPIC CHOLECYSTECTOMY
Anesthesia: General

## 2021-03-25 MED ORDER — OXYCODONE HCL 5 MG/5ML PO SOLN: 5.0000 mg | Freq: Once | ORAL | Status: AC | PRN

## 2021-03-25 MED ORDER — ACETAMINOPHEN 500 MG PO TABS
1000.0000 mg | ORAL_TABLET | ORAL | Status: AC
  Administered 2021-03-25: 1000 mg via ORAL
  Filled 2021-03-25: qty 2

## 2021-03-25 MED ORDER — LIDOCAINE 2% (20 MG/ML) 5 ML SYRINGE
INTRAMUSCULAR | Status: DC | PRN
  Administered 2021-03-25: 80 mg via INTRAVENOUS

## 2021-03-25 MED ORDER — BUPIVACAINE-EPINEPHRINE 0.25% -1:200000 IJ SOLN
INTRAMUSCULAR | Status: DC | PRN
  Administered 2021-03-25: 10 mL

## 2021-03-25 MED ORDER — ONDANSETRON HCL 4 MG/2ML IJ SOLN: 4.0000 mg | Freq: Once | INTRAMUSCULAR | Status: DC | PRN

## 2021-03-25 MED ORDER — BUPIVACAINE-EPINEPHRINE (PF) 0.25% -1:200000 IJ SOLN
INTRAMUSCULAR | Status: AC
  Filled 2021-03-25: qty 30

## 2021-03-25 MED ORDER — FENTANYL CITRATE (PF) 100 MCG/2ML IJ SOLN
25.0000 ug | INTRAMUSCULAR | Status: DC | PRN
  Administered 2021-03-25: 50 ug via INTRAVENOUS

## 2021-03-25 MED ORDER — FENTANYL CITRATE (PF) 100 MCG/2ML IJ SOLN
INTRAMUSCULAR | Status: AC
  Filled 2021-03-25: qty 2

## 2021-03-25 MED ORDER — ONDANSETRON HCL 4 MG/2ML IJ SOLN
INTRAMUSCULAR | Status: DC | PRN
  Administered 2021-03-25: 4 mg via INTRAVENOUS

## 2021-03-25 MED ORDER — CHLORHEXIDINE GLUCONATE 0.12 % MT SOLN
15.0000 mL | Freq: Once | OROMUCOSAL | Status: AC
  Administered 2021-03-25: 15 mL via OROMUCOSAL
  Filled 2021-03-25: qty 15

## 2021-03-25 MED ORDER — HYDROCODONE-ACETAMINOPHEN 10-325 MG PO TABS: 1.0000 | ORAL_TABLET | Freq: Four times a day (QID) | ORAL | 0 refills | Status: DC | PRN

## 2021-03-25 MED ORDER — SODIUM CHLORIDE 0.9 % IR SOLN
Status: DC | PRN
  Administered 2021-03-25: 1000 mL

## 2021-03-25 MED ORDER — LACTATED RINGERS IV SOLN: INTRAVENOUS | Status: DC

## 2021-03-25 MED ORDER — FENTANYL CITRATE (PF) 250 MCG/5ML IJ SOLN
INTRAMUSCULAR | Status: AC
  Filled 2021-03-25: qty 5

## 2021-03-25 MED ORDER — SUGAMMADEX SODIUM 200 MG/2ML IV SOLN
INTRAVENOUS | Status: DC | PRN
  Administered 2021-03-25: 200 mg via INTRAVENOUS

## 2021-03-25 MED ORDER — ONDANSETRON HCL 4 MG/2ML IJ SOLN
INTRAMUSCULAR | Status: AC
  Filled 2021-03-25: qty 2

## 2021-03-25 MED ORDER — 0.9 % SODIUM CHLORIDE (POUR BTL) OPTIME
TOPICAL | Status: DC | PRN
  Administered 2021-03-25: 1000 mL

## 2021-03-25 MED ORDER — SODIUM CHLORIDE 0.9 % IV SOLN: 250.0000 mL | INTRAVENOUS | Status: DC | PRN

## 2021-03-25 MED ORDER — PROPOFOL 10 MG/ML IV BOLUS
INTRAVENOUS | Status: DC | PRN
  Administered 2021-03-25: 120 mg via INTRAVENOUS

## 2021-03-25 MED ORDER — MORPHINE SULFATE (PF) 2 MG/ML IV SOLN: 1.0000 mg | INTRAVENOUS | Status: DC | PRN

## 2021-03-25 MED ORDER — OXYCODONE HCL 5 MG PO TABS: 5.0000 mg | ORAL_TABLET | ORAL | Status: DC | PRN

## 2021-03-25 MED ORDER — OXYCODONE HCL 5 MG PO TABS
5.0000 mg | ORAL_TABLET | Freq: Once | ORAL | Status: AC | PRN
  Administered 2021-03-25: 5 mg via ORAL

## 2021-03-25 MED ORDER — ACETAMINOPHEN 325 MG PO TABS: 650.0000 mg | ORAL_TABLET | ORAL | Status: DC | PRN

## 2021-03-25 MED ORDER — CIPROFLOXACIN IN D5W 400 MG/200ML IV SOLN
400.0000 mg | INTRAVENOUS | Status: AC
  Administered 2021-03-25: 400 mg via INTRAVENOUS
  Filled 2021-03-25: qty 200

## 2021-03-25 MED ORDER — ENSURE PRE-SURGERY PO LIQD: 296.0000 mL | Freq: Once | ORAL | Status: DC

## 2021-03-25 MED ORDER — PROPOFOL 10 MG/ML IV BOLUS
INTRAVENOUS | Status: AC
  Filled 2021-03-25: qty 20

## 2021-03-25 MED ORDER — SODIUM CHLORIDE 0.9% FLUSH: 3.0000 mL | Freq: Two times a day (BID) | INTRAVENOUS | Status: DC

## 2021-03-25 MED ORDER — SODIUM CHLORIDE 0.9% FLUSH: 3.0000 mL | INTRAVENOUS | Status: DC | PRN

## 2021-03-25 MED ORDER — FENTANYL CITRATE (PF) 250 MCG/5ML IJ SOLN
INTRAMUSCULAR | Status: DC | PRN
  Administered 2021-03-25 (×4): 50 ug via INTRAVENOUS

## 2021-03-25 MED ORDER — LIDOCAINE 2% (20 MG/ML) 5 ML SYRINGE
INTRAMUSCULAR | Status: AC
  Filled 2021-03-25: qty 5

## 2021-03-25 MED ORDER — ACETAMINOPHEN 650 MG RE SUPP: 650.0000 mg | RECTAL | Status: DC | PRN

## 2021-03-25 MED ORDER — DEXAMETHASONE SODIUM PHOSPHATE 10 MG/ML IJ SOLN
INTRAMUSCULAR | Status: AC
  Filled 2021-03-25: qty 1

## 2021-03-25 MED ORDER — ORAL CARE MOUTH RINSE: 15.0000 mL | Freq: Once | OROMUCOSAL | Status: AC

## 2021-03-25 MED ORDER — ROCURONIUM BROMIDE 10 MG/ML (PF) SYRINGE
PREFILLED_SYRINGE | INTRAVENOUS | Status: AC
  Filled 2021-03-25: qty 10

## 2021-03-25 MED ORDER — ROCURONIUM BROMIDE 10 MG/ML (PF) SYRINGE
PREFILLED_SYRINGE | INTRAVENOUS | Status: DC | PRN
  Administered 2021-03-25: 60 mg via INTRAVENOUS

## 2021-03-25 MED ORDER — OXYCODONE HCL 5 MG PO TABS
ORAL_TABLET | ORAL | Status: AC
  Filled 2021-03-25: qty 2

## 2021-03-25 MED ORDER — SODIUM CHLORIDE 0.9 % IV SOLN: INTRAVENOUS | Status: DC

## 2021-03-25 MED ORDER — DEXAMETHASONE SODIUM PHOSPHATE 10 MG/ML IJ SOLN
INTRAMUSCULAR | Status: DC | PRN
  Administered 2021-03-25: 10 mg via INTRAVENOUS

## 2021-03-25 SURGICAL SUPPLY — 47 items
ADH SKN CLS APL DERMABOND .7 (GAUZE/BANDAGES/DRESSINGS) ×1
APL PRP STRL LF DISP 70% ISPRP (MISCELLANEOUS) ×1
APPLIER CLIP 5 13 M/L LIGAMAX5 (MISCELLANEOUS) ×2
APR CLP MED LRG 5 ANG JAW (MISCELLANEOUS) ×1
BAG SPEC RTRVL 10 TROC 200 (ENDOMECHANICALS) ×1
BLADE CLIPPER SURG (BLADE) IMPLANT
CANISTER SUCT 3000ML PPV (MISCELLANEOUS) ×2 IMPLANT
CHLORAPREP W/TINT 26 (MISCELLANEOUS) ×2 IMPLANT
CLIP APPLIE 5 13 M/L LIGAMAX5 (MISCELLANEOUS) ×1 IMPLANT
CLSR STERI-STRIP ANTIMIC 1/2X4 (GAUZE/BANDAGES/DRESSINGS) ×1 IMPLANT
COVER MAYO STAND STRL (DRAPES) IMPLANT
COVER SURGICAL LIGHT HANDLE (MISCELLANEOUS) ×2 IMPLANT
COVER WAND RF STERILE (DRAPES) ×1 IMPLANT
DERMABOND ADVANCED (GAUZE/BANDAGES/DRESSINGS) ×1
DERMABOND ADVANCED .7 DNX12 (GAUZE/BANDAGES/DRESSINGS) ×1 IMPLANT
DRAPE C-ARM 42X120 X-RAY (DRAPES) IMPLANT
ELECT REM PT RETURN 9FT ADLT (ELECTROSURGICAL) ×2
ELECTRODE REM PT RTRN 9FT ADLT (ELECTROSURGICAL) ×1 IMPLANT
GLOVE BIO SURGEON STRL SZ7 (GLOVE) ×2 IMPLANT
GLOVE BIOGEL PI IND STRL 7.5 (GLOVE) ×1 IMPLANT
GLOVE BIOGEL PI INDICATOR 7.5 (GLOVE) ×1
GOWN STRL REUS W/ TWL LRG LVL3 (GOWN DISPOSABLE) ×3 IMPLANT
GOWN STRL REUS W/TWL LRG LVL3 (GOWN DISPOSABLE) ×6
GRASPER SUT TROCAR 14GX15 (MISCELLANEOUS) ×2 IMPLANT
KIT BASIN OR (CUSTOM PROCEDURE TRAY) ×2 IMPLANT
KIT TURNOVER KIT B (KITS) ×2 IMPLANT
NS IRRIG 1000ML POUR BTL (IV SOLUTION) ×2 IMPLANT
PAD ARMBOARD 7.5X6 YLW CONV (MISCELLANEOUS) ×2 IMPLANT
POUCH RETRIEVAL ECOSAC 10 (ENDOMECHANICALS) ×1 IMPLANT
POUCH RETRIEVAL ECOSAC 10MM (ENDOMECHANICALS) ×2
SCISSORS LAP 5X35 DISP (ENDOMECHANICALS) ×2 IMPLANT
SET CHOLANGIOGRAPH 5 50 .035 (SET/KITS/TRAYS/PACK) IMPLANT
SET IRRIG TUBING LAPAROSCOPIC (IRRIGATION / IRRIGATOR) ×2 IMPLANT
SET TUBE SMOKE EVAC HIGH FLOW (TUBING) ×2 IMPLANT
SLEEVE ENDOPATH XCEL 5M (ENDOMECHANICALS) ×4 IMPLANT
SPECIMEN JAR SMALL (MISCELLANEOUS) ×2 IMPLANT
STRIP CLOSURE SKIN 1/2X4 (GAUZE/BANDAGES/DRESSINGS) ×2 IMPLANT
SUT MNCRL AB 4-0 PS2 18 (SUTURE) ×2 IMPLANT
SUT VIC AB 3-0 SH 27 (SUTURE) ×2
SUT VIC AB 3-0 SH 27X BRD (SUTURE) IMPLANT
SUT VICRYL 0 UR6 27IN ABS (SUTURE) ×2 IMPLANT
TOWEL GREEN STERILE (TOWEL DISPOSABLE) ×2 IMPLANT
TOWEL GREEN STERILE FF (TOWEL DISPOSABLE) ×2 IMPLANT
TRAY LAPAROSCOPIC MC (CUSTOM PROCEDURE TRAY) ×2 IMPLANT
TROCAR XCEL BLUNT TIP 100MML (ENDOMECHANICALS) ×2 IMPLANT
TROCAR XCEL NON-BLD 5MMX100MML (ENDOMECHANICALS) ×2 IMPLANT
WATER STERILE IRR 1000ML POUR (IV SOLUTION) ×2 IMPLANT

## 2021-03-25 NOTE — Interval H&P Note (Signed)
History and Physical Interval Note:  03/25/2021 7:37 AM  Amy Vang  has presented today for surgery, with the diagnosis of GALLSTONES.  The various methods of treatment have been discussed with the patient and family. After consideration of risks, benefits and other options for treatment, the patient has consented to  Procedure(s) with comments: LAPAROSCOPIC CHOLECYSTECTOMY WITH INTRAOPERATIVE CHOLANGIOGRAM (N/A) - 90/RM1 as a surgical intervention.  The patient's history has been reviewed, patient examined, no change in status, stable for surgery.  I have reviewed the patient's chart and labs.  Questions were answered to the patient's satisfaction.     Rolm Bookbinder

## 2021-03-25 NOTE — Anesthesia Procedure Notes (Signed)
Procedure Name: Intubation Date/Time: 03/25/2021 7:42 AM Performed by: Kyung Rudd, CRNA Pre-anesthesia Checklist: Patient identified, Emergency Drugs available, Suction available and Patient being monitored Patient Re-evaluated:Patient Re-evaluated prior to induction Oxygen Delivery Method: Circle system utilized Preoxygenation: Pre-oxygenation with 100% oxygen Induction Type: IV induction Ventilation: Mask ventilation without difficulty and Oral airway inserted - appropriate to patient size Laryngoscope Size: Mac and 3 Grade View: Grade III Tube type: Oral Tube size: 7.0 mm Number of attempts: 1 Airway Equipment and Method: Stylet Placement Confirmation: ETT inserted through vocal cords under direct vision,  positive ETCO2 and breath sounds checked- equal and bilateral Secured at: 21 cm Tube secured with: Tape Dental Injury: Teeth and Oropharynx as per pre-operative assessment

## 2021-03-25 NOTE — Progress Notes (Signed)
1147 - wasted 79mcg fentanyl and oxcodone ir 5mg  which was NOT given / witnessed by Angelina Pih RN

## 2021-03-25 NOTE — Op Note (Signed)
Preoperative diagnosis: biliary colic Postoperative diagnosis: Same as above Surgery: Laparoscopic cholecystectomy, primary umbilical hernia repair Surgeon: Dr. Serita Grammes Anesthesia: General Estimated blood loss: Minimal Drains: None Specimens: Gallbladder and contents to pathology Complications: None Special count was correct x2 at end of operation Disposition to recovery stable condition  Indications: 67 yof who is here with her daughter who is AD of endoscopy unit at [facility] and is referred by Dr Watt Climes for gallstones. she has longstanding gerd and functional ab pain. recently she has noted increased n/v associated with a squeezing in chest and epigastrium. she also has back pain associated with this. it is associated with eating. she is having no change in bowel function. she had an [place] that shows choleliathisis recently. we discussed a lap chole.   Procedure: After informed consent was obtained the patient was taken to the operating room. She was given antibiotics. SCDs were in place. She was administered with the rest medications. She was placed under general anesthesia without complication. She was prepped and draped in the standard sterile surgical fashion. Surgical timeout was then performed.  I infiltrated Marcaine below her umbilicus. I then made a curvilinear incision and lifted up the umbilical stalk.  She has a less than 1 cm umbilical hernia present. I dissected down and grasped the fascia. I incised this sharply and entered the peritoneum bluntly. There was no evidence of an entry injury. I placed a 0 Vicryl pursestring suture through the fascia around there hernia. I then inserted a Hassan trocar and insufflated the abdomen to 15 mmHg pressure. I then inserted 3 further 5 mm trocars in epigastrium and right side of the abdomen under direct vision without complication.  I then retracted her gallbladder cephalad and laterally. I eventually was able to dissect  the triangle and obtain the critical view of safety. I took the gallbladder off the cystic plate of the liver to ensure that this was the correct view. I then clipped the cystic duct with 3 clips and divided it. I left 2 clips in place. The duct was viable and the clips completely traversed the duct. I treated the artery in a similar fashion. I then took the gallbladder off the liver bed and placed it in a retrieval bag.   Hemostasis was observed. I irrigated and this was clear. I then removed the hasson trocar and tied the pursestring down.  I placed an additional 0 vicryl suture through the hernia to repair this as well. I then tacked the umbilicus down with 3-0 vicryl suture.   I then desufflated the abdomen and removed the remaining trocars. I closed these with 4-0 Monocryl and glue. She tolerated this well was extubated and transferred to recovery stable.

## 2021-03-25 NOTE — Transfer of Care (Signed)
Immediate Anesthesia Transfer of Care Note  Patient: [name]  Procedure(s) Performed: LAPAROSCOPIC CHOLECYSTECTOMY (N/A ) Primary REPAIR UMBILICAL HERNIA (N/A )  Patient Location: PACU  Anesthesia Type:General  Level of Consciousness: awake, alert  and oriented  Airway & Oxygen Therapy: Patient Spontanous Breathing  Post-op Assessment: Report given to RN, Post -op Vital signs reviewed and stable and Patient moving all extremities X 4  Post vital signs: Reviewed and stable  Last Vitals:  Vitals Value Taken Time  BP 163/62 03/25/21 0846  Temp    Pulse 71 03/25/21 0848  Resp 16 03/25/21 0848  SpO2 92 % 03/25/21 0848  Vitals shown include unvalidated device data.  Last Pain:  Vitals:   03/25/21 0629  TempSrc: Oral  PainSc:       Patients Stated Pain Goal: 4 (86/75/44 9201)  Complications: No complications documented.

## 2021-03-25 NOTE — H&P (Signed)
Amy Vang is an 75 y.o. female.   Chief Complaint: gallstones HPI: 18 yof who is here with her daughter who is AD of endoscopy unit at [facility] and is referred by Dr Watt Climes for gallstones. she has longstanding gerd and functional ab pain. recently she has noted increased n/v associated with a squeezing in chest and epigastrium. she also has back pain associated with this. it is associated with eating. she is having no change in bowel function. she had an [place] that shows choleliathisis recently. she is here to discuss options  Past Medical History:  Diagnosis Date  . Allergic rhinitis   . Anemia   . Anxiety   . Chronic constipation   . COVID-19 08/2019  . Degenerative disc disease, cervical   . Essential hypertension   . GERD (gastroesophageal reflux disease)   . H/O bronchitis   . Hiatal hernia   . History of echocardiogram    in epic 04-21-2019,  normal w/ ef 60-65% and mild mitral regurg, no stenosis or restriction  . History of TIAs    per pt age 24;  2008;  05/ 2013;  pt stated no residual was from sleep doctor more than likely sleep deprived than actual tia  . Motion sickness    cars  . Nocturia   . OSA on CPAP    Severe ,, CPAP 12 MM H2O- Dr Radford Pax  . PONV (postoperative nausea and vomiting)   . Restless leg syndrome   . Rosacea   . Seasonal asthma    no inhaler    Past Surgical History:  Procedure Laterality Date  . ANTERIOR CERVICAL DECOMP/DISCECTOMY FUSION N/A 10/25/2017   Procedure: CERVICAL SIX- CERVICAL SEVEN ANTERIOR CERVICAL DECOMPRESSION/DISCECTOMY FUSION, INTERBODY PROSTHESIS AND ANTERIOR PLATING;  Surgeon: Newman Pies, MD;  Location: [place];  Service: Neurosurgery;  Laterality: N/A;  CERVICAL 6- CERVICAL 7 ANTERIOR CERVICAL DECOMPRESSION/DISCECTOMY FUSION, INTERBODY PROSTHESIS AND ANTERIOR PLATING  . BILATERAL BENIGN BREAST BX'S  20 YRS AGO  . BREAST BIOPSY Right    benign  . BREAST EXCISIONAL BIOPSY Bilateral over 10 years ago   benign  . CARDIAC  CATHETERIZATION N/A 08/13/2016   Procedure: Right Heart Cath;  Surgeon: Larey Dresser, MD;  Location: [place] [facility];  Service: Cardiovascular;  Laterality: N/A;    normal study, no evidence pulm HTN  . CARPAL TUNNEL RELEASE  2009   BILATERAL  . CATARACT EXTRACTION W/PHACO Right 02/27/2020   Procedure: CATARACT EXTRACTION PHACO AND INTRAOCULAR LENS PLACEMENT (IOC) RIGHT 3.60 00:23.9;  Surgeon: Birder Robson, MD;  Location: [place];  Service: Ophthalmology;  Laterality: Right;  sleep apnea  . CATARACT EXTRACTION W/PHACO Left 05/21/2020   Procedure: CATARACT EXTRACTION PHACO AND INTRAOCULAR LENS PLACEMENT (IOC) LEFT 3.69 00:30.9;  Surgeon: Birder Robson, MD;  Location: [place];  Service: Ophthalmology;  Laterality: Left;  sleep apnea  . CYSTOCELE REPAIR  12/11/2011   Procedure: ANTERIOR REPAIR (CYSTOCELE);  Surgeon: Claybon Jabs, MD;  Location: [facility];  Service: Urology;  Laterality: N/A;  1 1/2 hour requested for case  Anterior repair and mid Urethral Sling  . MASS EXCISION N/A 07/24/2019   Procedure: EXCISION OF VAGINAL FOREIGN BODY;  Surgeon: Kathie Rhodes, MD;  Location: [facility];  Service: Urology;  Laterality: N/A;  . NASAL SEPTOPLASTY W/ TURBINOPLASTY Bilateral 11/18/2016   Procedure: NASAL SEPTOPLASTY WITH BILATERAL TURBINATE REDUCTION;  Surgeon: Jerrell Belfast, MD;  Location: [place];  Service: ENT;  Laterality: Bilateral;  .  NONINVASIVE VASCULAR CAROTID STUDY  09-14-2007   BILATERAL MILD MIX PLAQUE THROUGHOUT, NO SIGNIFICANT BILATERAL ICA STENOSIS  . PUBOVAGINAL SLING  12/11/2011   Procedure: Gaynelle Arabian;  Surgeon: Claybon Jabs, MD;  Location: [facility];  Service: Urology;  Laterality: N/A;  . PULLEY RELEASE RIGHT THUMB  2009  . SHOULDER ARTHROSCOPY DISTAL CLAVICLE EXCISION AND OPEN ROTATOR CUFF REPAIR Right 01-17-2019   dr supple @[facility]   . SINUS ENDO WITH FUSION Bilateral 11/18/2016    Procedure: BILATERAL ENDOSCOPIC SINUS SURGERY;  Surgeon: Jerrell Belfast, MD;  Location: [place];  Service: ENT;  Laterality: Bilateral;  . VAGINAL HYSTERECTOMY  AGE 39    Family History  Adopted: Yes  Problem Relation Age of Onset  . Diabetes Mother   . Stroke Mother   . Alzheimer's disease Mother   . Hypertension Sister   . Diabetes Sister   . Heart attack Brother   . Leukemia Brother   . CAD Brother   . Diabetes Brother   . Colon cancer Maternal Aunt   . Stroke Paternal Grandmother   . Heart attack Paternal Grandfather    Social History:  reports that she has never smoked. She has never used smokeless tobacco. She reports that she does not drink alcohol and does not use drugs.  Allergies:  Allergies  Allergen Reactions  . Penicillins Hives, Rash and Other (See Comments)    Patient has had a PCN reaction with immediate rash, facial/tongue/throat swelling, SOB, and or lightheadedness with hypotension; Has also developed severe rash involving mucus membranes or skin necrosis.   Has patient had a PCN reaction that required hospitalization No Has patient had a PCN reaction occurring within the last 10 years: No If all of the above answers are "NO", then may proceed with Cephalosporin use.   Marland Kitchen Hydroxyzine Hcl     UNSPECIFIED REACTION   . Promethazine Hcl     Other reaction(s): hyperactivity  . Ciprofloxacin Anxiety  . Lactose Intolerance (Gi) Other (See Comments)    bloating  . Septra [Sulfamethoxazole-Trimethoprim] Rash    Medications Prior to Admission  Medication Sig Dispense Refill  . acetaminophen (TYLENOL) 650 MG CR tablet Take 1,300 mg by mouth at bedtime.    Marland Kitchen albuterol (VENTOLIN HFA) 108 (90 Base) MCG/ACT inhaler Inhale 1-2 puffs into the lungs every 6 (six) hours as needed for wheezing or shortness of breath.    Marland Kitchen azithromycin (ZITHROMAX) 250 MG tablet Take 250-500 mg by mouth as directed.    Marland Kitchen CIPROFLOXACIN PO Take by mouth.    . citalopram (CELEXA) 20 MG tablet  Take 20 mg by mouth daily.  2  . clonazePAM (KLONOPIN) 0.5 MG tablet Take 0.5 mg by mouth at bedtime.    . cyclobenzaprine (FLEXERIL) 10 MG tablet Take 1 tablet (10 mg total) by mouth 3 (three) times daily as needed for muscle spasms. 50 tablet 1  . famotidine (PEPCID) 40 MG tablet Take 40 mg by mouth at bedtime.    . fexofenadine (ALLEGRA) 180 MG tablet Take 180 mg by mouth daily.    . fluticasone (FLONASE) 50 MCG/ACT nasal spray Place 2 sprays into both nostrils 2 (two) times daily as needed for allergies.    Marland Kitchen guaiFENesin (MUCINEX) 600 MG 12 hr tablet Take 600 mg by mouth daily.    . hydrochlorothiazide (HYDRODIURIL) 25 MG tablet Take 1 tablet (25 mg total) by mouth daily.    Marland Kitchen HYDROcodone-acetaminophen (NORCO/VICODIN) 5-325 MG tablet Take 1 tablet by mouth 4 (four)  times daily as needed for moderate pain.    Marland Kitchen HYDROcodone-homatropine (HYCODAN) 5-1.5 MG/5ML syrup Take 2.5 mLs by mouth every 12 (twelve) hours as needed for cough.    . methocarbamol (ROBAXIN) 500 MG tablet Take 500 mg by mouth every 6 (six) hours as needed for muscle spasms.    . NON FORMULARY     . ondansetron (ZOFRAN) 4 MG tablet Take 4 mg by mouth every 8 (eight) hours as needed for nausea or vomiting.    . pantoprazole (PROTONIX) 40 MG tablet Take 40 mg by mouth 2 (two) times daily.    . polyethylene glycol (MIRALAX / GLYCOLAX) packet Take 17 g by mouth daily as needed for mild constipation.     . potassium chloride (MICRO-K) 10 MEQ CR capsule Take 10 mEq by mouth 2 (two) times daily.     . Probiotic Product (ALIGN PO) Take 1 capsule by mouth daily.    . promethazine (PHENERGAN) 25 MG tablet Take 25 mg by mouth every 6 (six) hours as needed for nausea or vomiting.    . traZODone (DESYREL) 50 MG tablet Take 37.5 mg by mouth at bedtime.    . dicyclomine (BENTYL) 20 MG tablet Take 20 mg by mouth 4 (four) times daily as needed for spasms.      No results found for this or any previous visit (from the past 48 hour(s)). No  results found.  Review of Systems Review of Systems Carrolyn Leigh CMA; 03/06/2021 1:52 PM) Skin Present- Dryness. Not Present- Change in Wart/Mole, Hives, Jaundice, New Lesions, Non-Healing Wounds, Rash and Ulcer. HEENT Present- Seasonal Allergies. Not Present- Earache, Hearing Loss, Hoarseness, Nose Bleed, Oral Ulcers, Ringing in the Ears, Sinus Pain, Sore Throat, Visual Disturbances, Wears glasses/contact lenses and Yellow Eyes. Breast Not Present- Breast Mass, Breast Pain, Nipple Discharge and Skin Changes. Blood pressure (!) 167/59, pulse 67, temperature 98 F (36.7 C), temperature source Oral, resp. rate 18, height 5\' 5"  (1.651 m), weight 75.3 kg, SpO2 95 %. Physical Exam  Vitals Arville Go Fox CMA; 03/06/2021 1:54 PM) 03/06/2021 1:53 PM Height: 65in Temp.: 97.3F  Pulse: 96 (Regular)  P.OX: 100% (Room air) BP: 124/76(Sitting, Left Arm, Standard) Physical Exam Rolm Bookbinder MD; 03/07/2021 8:55 AM) General Mental Status-Alert. Orientation-Oriented X3. Abdomen Note: soft tender epigastrium and ruq nondistended  Assessment/Plan Assessment & Plan Rolm Bookbinder MD; 03/07/2021 8:57 AM) GALLSTONES (K80.20) Story: Laparoscopic cholecystectomy I do think this is related to her gallbladder and lap chole would help I discussed the procedure in detail. We discussed the risks and benefits of a laparoscopic cholecystectomy and possible cholangiogram including, but not limited to bleeding, infection, injury to surrounding structures such as the intestine or liver, bile leak, retained gallstones, need to convert to an open procedure, prolonged diarrhea, blood clots such as DVT, common bile duct injury, anesthesia risks, and possible need for additional procedures. The likelihood of improvement in symptoms and return to the patient's normal status is good. We discussed the typical post-operative recovery course.   Rolm Bookbinder, MD 03/25/2021, 7:34 AM

## 2021-03-25 NOTE — Anesthesia Postprocedure Evaluation (Signed)
Anesthesia Post Note  Patient: Amy Vang  Procedure(s) Performed: LAPAROSCOPIC CHOLECYSTECTOMY (N/A ) Primary REPAIR UMBILICAL HERNIA (N/A )     Patient location during evaluation: PACU Anesthesia Type: General Level of consciousness: awake and alert Pain management: pain level controlled Vital Signs Assessment: post-procedure vital signs reviewed and stable Respiratory status: spontaneous breathing, nonlabored ventilation, respiratory function stable and patient connected to nasal cannula oxygen Cardiovascular status: blood pressure returned to baseline and stable Postop Assessment: no apparent nausea or vomiting Anesthetic complications: no   No complications documented.  Last Vitals:  Vitals:   03/25/21 0918 03/25/21 0932  BP: (!) 167/72 (!) 157/72  Pulse: 72 68  Resp: 16   Temp:  (!) 36.4 C  SpO2: 95% 94%    Last Pain:  Vitals:   03/25/21 0915  TempSrc:   PainSc: 4                  Joshiah Traynham COKER

## 2021-03-25 NOTE — Discharge Instructions (Signed)
[facility] LAPAROSCOPIC SURGERY: POST OP INSTRUCTIONS  Always review your discharge instruction sheet given to you by the facility where your surgery was performed. IF YOU HAVE DISABILITY OR FAMILY LEAVE FORMS, YOU MUST BRING THEM TO THE OFFICE FOR PROCESSING.   DO NOT GIVE THEM TO YOUR DOCTOR.  1. A prescription for pain medication may be given to you upon discharge.  Take your pain medication as prescribed, if needed.  If narcotic pain medicine is not needed, then you may take acetaminophen (Tylenol), naprosyn (Alleve), or ibuprofen (Advil) as needed. 2. Take your usually prescribed medications unless otherwise directed. 3. If you need a refill on your pain medication, please contact your pharmacy.  They will contact our office to request authorization. Prescriptions will not be filled after 5pm or on week-ends. 4. You should follow a light diet the first few days after arrival home, such as soup and crackers, etc.  Be sure to include lots of fluids daily. 5. Most patients will experience some swelling and bruising in the area of the incisions.  Ice packs will help.  Swelling and bruising can take several days to resolve.  6. It is common to experience some constipation if taking pain medication after surgery.  Increasing fluid intake and taking a stool softener (such as Colace) will usually help or prevent this problem from occurring.  A mild laxative (Milk of Magnesia or Miralax) should be taken according to package instructions if there are no bowel movements after 48 hours. 7. Unless discharge instructions indicate otherwise, you may remove your bandages 48 hours after surgery, and you may shower at that time.  You may have steri-strips (small skin tapes) in place directly over the incision.  These strips should be left on the skin for 7-10 days.  If your surgeon used skin glue on the incision, you may shower in 24 hours.  The glue will flake  off over the next 2-3 weeks.  Any sutures or staples will be removed at the office during your follow-up visit. 8. ACTIVITIES:  You may resume regular (light) daily activities beginning the next day--such as daily self-care, walking, climbing stairs--gradually increasing activities as tolerated.  You may have sexual intercourse when it is comfortable.  Refrain from any heavy lifting or straining until approved by your doctor. a. You may drive when you are no longer taking prescription pain medication, you can comfortably wear a seatbelt, and you can safely maneuver your car and apply brakes. b. RETURN TO WORK:  __________________________________________________________ 9. You should see your doctor in the office for a follow-up appointment approximately 2-3 weeks after your surgery.  Make sure that you call for this appointment within a day or two after you arrive home to insure a convenient appointment time. 10. OTHER INSTRUCTIONS: __________________________________________________________________________________________________________________________ __________________________________________________________________________________________________________________________ WHEN TO CALL YOUR DOCTOR: 1. Fever over 101.0 2. Inability to urinate 3. Continued bleeding from incision. 4. Increased pain, redness, or drainage from the incision. 5. Increasing abdominal pain  The clinic staff is available to answer your questions during regular business hours.  Please don't hesitate to call and ask to speak to one of the nurses for clinical concerns.  If you have a medical emergency, go to the nearest emergency room or call 911.  A surgeon from [facility] is always on call at the hospital. [address], [address], [place], [place]  [address] ? [address] [address], [place], [place]   [address] [phone] ? [phone] ? [phone] Web site: [email or web address]  

## 2021-03-26 ENCOUNTER — Encounter (HOSPITAL_COMMUNITY): Payer: Self-pay | Admitting: General Surgery

## 2021-03-26 LAB — SURGICAL PATHOLOGY

## 2021-04-01 ENCOUNTER — Other Ambulatory Visit (HOSPITAL_COMMUNITY)
Admission: RE | Admit: 2021-04-01 | Discharge: 2021-04-01 | Disposition: A | Discharge status: Home / Self Care | Payer: Medicare Other | Source: Ambulatory Visit | Attending: Diagnostic Radiology | Admitting: Diagnostic Radiology

## 2021-04-01 ENCOUNTER — Ambulatory Visit
Admission: RE | Admit: 2021-04-01 | Discharge: 2021-04-01 | Disposition: A | Discharge status: Home / Self Care | Payer: Medicare Other | Source: Ambulatory Visit | Attending: Diagnostic Radiology | Admitting: Diagnostic Radiology

## 2021-04-01 DIAGNOSIS — E042 Nontoxic multinodular goiter: Secondary | ICD-10-CM | POA: Diagnosis not present

## 2021-04-01 DIAGNOSIS — E041 Nontoxic single thyroid nodule: Secondary | ICD-10-CM | POA: Diagnosis not present

## 2021-04-03 LAB — CYTOLOGY - NON PAP

## 2021-04-16 ENCOUNTER — Other Ambulatory Visit: Payer: Medicare Other

## 2021-05-08 DIAGNOSIS — M47816 Spondylosis without myelopathy or radiculopathy, lumbar region: Secondary | ICD-10-CM | POA: Diagnosis not present

## 2021-05-15 DIAGNOSIS — I272 Pulmonary hypertension, unspecified: Secondary | ICD-10-CM | POA: Diagnosis not present

## 2021-05-15 DIAGNOSIS — E7800 Pure hypercholesterolemia, unspecified: Secondary | ICD-10-CM | POA: Diagnosis not present

## 2021-05-15 DIAGNOSIS — I10 Essential (primary) hypertension: Secondary | ICD-10-CM | POA: Diagnosis not present

## 2021-05-15 DIAGNOSIS — G4700 Insomnia, unspecified: Secondary | ICD-10-CM | POA: Diagnosis not present

## 2021-05-15 DIAGNOSIS — K219 Gastro-esophageal reflux disease without esophagitis: Secondary | ICD-10-CM | POA: Diagnosis not present

## 2021-06-04 DIAGNOSIS — M545 Low back pain, unspecified: Secondary | ICD-10-CM | POA: Diagnosis not present

## 2021-06-09 DIAGNOSIS — M47816 Spondylosis without myelopathy or radiculopathy, lumbar region: Secondary | ICD-10-CM | POA: Diagnosis not present

## 2021-06-17 DIAGNOSIS — I272 Pulmonary hypertension, unspecified: Secondary | ICD-10-CM | POA: Diagnosis not present

## 2021-06-17 DIAGNOSIS — E7800 Pure hypercholesterolemia, unspecified: Secondary | ICD-10-CM | POA: Diagnosis not present

## 2021-06-17 DIAGNOSIS — K219 Gastro-esophageal reflux disease without esophagitis: Secondary | ICD-10-CM | POA: Diagnosis not present

## 2021-06-17 DIAGNOSIS — G4700 Insomnia, unspecified: Secondary | ICD-10-CM | POA: Diagnosis not present

## 2021-06-17 DIAGNOSIS — I10 Essential (primary) hypertension: Secondary | ICD-10-CM | POA: Diagnosis not present

## 2021-06-28 DIAGNOSIS — G4733 Obstructive sleep apnea (adult) (pediatric): Secondary | ICD-10-CM | POA: Diagnosis not present

## 2021-08-11 ENCOUNTER — Encounter: Payer: Self-pay | Admitting: Internal Medicine

## 2021-08-11 ENCOUNTER — Other Ambulatory Visit (INDEPENDENT_AMBULATORY_CARE_PROVIDER_SITE_OTHER): Payer: Medicare Other

## 2021-08-11 ENCOUNTER — Ambulatory Visit: Payer: Medicare Other | Admitting: Internal Medicine

## 2021-08-11 VITALS — BP 134/72 | HR 76 | Ht 63.78 in | Wt 172.0 lb

## 2021-08-11 DIAGNOSIS — K589 Irritable bowel syndrome without diarrhea: Secondary | ICD-10-CM | POA: Diagnosis not present

## 2021-08-11 DIAGNOSIS — J309 Allergic rhinitis, unspecified: Secondary | ICD-10-CM

## 2021-08-11 DIAGNOSIS — R1310 Dysphagia, unspecified: Secondary | ICD-10-CM | POA: Diagnosis not present

## 2021-08-11 DIAGNOSIS — G4700 Insomnia, unspecified: Secondary | ICD-10-CM

## 2021-08-11 DIAGNOSIS — K760 Fatty (change of) liver, not elsewhere classified: Secondary | ICD-10-CM | POA: Diagnosis not present

## 2021-08-11 DIAGNOSIS — E739 Lactose intolerance, unspecified: Secondary | ICD-10-CM | POA: Diagnosis not present

## 2021-08-11 DIAGNOSIS — R6889 Other general symptoms and signs: Secondary | ICD-10-CM | POA: Diagnosis not present

## 2021-08-11 DIAGNOSIS — R110 Nausea: Secondary | ICD-10-CM

## 2021-08-11 DIAGNOSIS — R1013 Epigastric pain: Secondary | ICD-10-CM

## 2021-08-11 DIAGNOSIS — K8020 Calculus of gallbladder without cholecystitis without obstruction: Secondary | ICD-10-CM | POA: Diagnosis not present

## 2021-08-11 DIAGNOSIS — K429 Umbilical hernia without obstruction or gangrene: Secondary | ICD-10-CM | POA: Diagnosis not present

## 2021-08-11 DIAGNOSIS — R0989 Other specified symptoms and signs involving the circulatory and respiratory systems: Secondary | ICD-10-CM

## 2021-08-11 LAB — HEPATIC FUNCTION PANEL
ALT: 14 U/L (ref 0–35)
AST: 15 U/L (ref 0–37)
Albumin: 4.3 g/dL (ref 3.5–5.2)
Alkaline Phosphatase: 45 U/L (ref 39–117)
Bilirubin, Direct: 0.1 mg/dL (ref 0.0–0.3)
Total Bilirubin: 0.6 mg/dL (ref 0.2–1.2)
Total Protein: 6.4 g/dL (ref 6.0–8.3)

## 2021-08-11 NOTE — Progress Notes (Addendum)
Amy Vang 75 y.o. [birth year] [id number]  Assessment & Plan:   Encounter Diagnoses  Name Primary?   Abdominal pain, epigastric Yes   Dysphagia, unspecified type    Symptomatic cholelithiasis - improved after cholecystectomy    Chronic nausea    Insomnia, unspecified type    Chronic throat clearing    Lactose intolerance    Irritable bowel syndrome    Umbilical hernia without obstruction and without gangrene    NAFLD (nonalcoholic fatty liver disease)    Allergic rhinitis, unspecified seasonality, unspecified trigger     Numerous problems as outlined above.  Focusing on the abdominal pain dysphagia and nausea with EGD.  Also barium swallow.  Question motility disturbance question stricture, cause not clear.  She had some esophageal dysmotility with some pooling of contrast in the lower esophagus at barium swallow years ago could this have progressed?  Also keep in mind she has a strong allergy history could she have eosinophilic esophagitis or eosinophilic gastritis or other similar issues.  Though unlikely a retained common duct stone is possible.  I think very unlikely I will check with LFTs at this point.  Fatty liver disease could be contributing some as well.  Will obtain records from previous gastroenterologist.  The risks and benefits as well as alternatives of endoscopic procedure(s) have been discussed and reviewed. All questions answered. The patient agrees to proceed.  She appears to be appropriate candidate for the [facility].  At cholecystectomy 03/25/2021 she was intubated on 1 attempt without special procedures.  Certainly does sound like she has a functional GI disturbance.  Given all of her nausea and insomnia issues (on a regimen for that as well) keep in mind the possibility of using mirtazapine.  I appreciate the opportunity to care for this patient. CC: Carol Ada, MD   Records review:  June 12, 2020 visit increased nausea increasing Librax was  helping  May 28, 2020 episodes of nausea vomiting generalized abdominal pain lasting an hour 3 times a week pain starts in the epigastrium radiates into the right left upper quadrant and then down.  Last colonoscopy 08/19/2018 small adenomatous polyp EGD 2013 small hiatal hernia  EGD was offered and she declined at that time repeat ultrasound was recommended and she declined that as well as a HIDA scan declined.  January 2021 some right scapular and upper quadrant pain not thought to be biliary by Dr.Mays overall improved at that point was on Dexilant  August 2019 visit Nexium refill doing well for the most part  October 2017 sounds like she had had pelvic floor physical therapy through urology question of a rectocele sounds like she might of had some difficulty with cleansing with defecation  August 2016 some epigastric pain and some vomiting spells he was continued on twice daily Nexium and Ambien was prescribed promethazine was prescribed  September 2015 twice daily PPI doing reasonably well September 2014 doing reasonably well on PPI and outlined 2012 visit dysphagia barium swallow showed dysmotility and spasm  Subjective:   Chief Complaint: Abdominal pain dysphagia some chest pain  HPI Amy Vang is a 75 year old white woman who is 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  Most recently she had a lot of chest pain radiating to the interscapular area she had a gallbladder ultrasound as below and underwent laparoscopic cholecystectomy with an umbilical hernia repair by Dr. Donne Hazel in  April.  She has some mild chest pain similar to that now but nothing like that and thinks the gallbladder surgery was successful without significant bowel disturbance since.  She did say in follow-up she said Dr. Donne Hazel said he "thought a stitch came out" when he was  examining her hernia repair site.  Ultrasound IMPRESSION: 1. Probable cholelithiasis now, with clustered subcentimeter echogenic foci with some shadowing. And this includes the previously seen 5-6 mm echogenic focus previously thought to be polyp, now more suggestive of adherent stone. No evidence of acute cholecystitis or bile duct obstruction. 2. Stable fatty liver disease.    Though she is improved she continues to have a lot of problems with nausea there is some mild pill and bread dysphagia.  Years ago she had a barium swallow in 2012 and there was some stasis of contrast in the lower esophagus and she was thought to possibly have presbyesophagus.  She had persistent stasis of contrast in the lower and mid thoracic esophagus with suspicion of reflux.  A 13 mm barium tablet passed.  At some point she has had an EGD we do not have those records her colonoscopy in 2019 showed some polyps.  Recommendation for 5-year follow-up colonoscopy at that time.  Currently about twice a week she will have to take ondansetron and sometimes Phenergan to control nausea.  She used to have nausea and vomiting but now that she has the medications she avoids vomiting.  But she will have to go to bed for a while.  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  She says that when she was a child she was not allowed to express herself well and that she internalized issues.  She is on a PPI and sucralfate though if she finds sucralfate hard to take and does not use it very much.  She also has dicyclomine which she will take at bedtime. Allergies  Allergen Reactions   Hydroxyzine Hcl     UNSPECIFIED REACTION    Promethazine Hcl     Other reaction(s): hyperactivity   Ciprofloxacin Anxiety   Lactose Intolerance (Gi) Other (See Comments)    bloating   Penicillins Hives, Rash and Other (See Comments)    Patient has had a PCN reaction with immediate rash, facial/tongue/throat  swelling, SOB, and or lightheadedness with hypotension; Has also developed severe rash involving mucus membranes or skin necrosis.   Has patient had a PCN reaction that required hospitalization No Has patient had a PCN reaction occurring within the last 10 years: No If all of the above answers are "NO", then may proceed with Cephalosporin use.    Septra [Sulfamethoxazole-Trimethoprim] Rash   Current Meds  Medication Sig   acetaminophen (TYLENOL) 650 MG CR tablet Take 1,300 mg by mouth at bedtime.   citalopram (CELEXA) 20 MG tablet Take 20 mg by mouth daily.   clonazePAM (KLONOPIN) 0.5 MG tablet Take 0.5 mg by mouth at bedtime.   cyclobenzaprine (FLEXERIL) 10 MG tablet Take 1 tablet (10 mg total) by mouth 3 (three) times daily as needed for muscle spasms.   dicyclomine (BENTYL) 20 MG tablet Take 20 mg by mouth 4 (four) times daily as needed for spasms.   famotidine (PEPCID) 40 MG tablet Take 40 mg by mouth at bedtime.   fexofenadine (ALLEGRA) 180 MG tablet Take 180 mg by mouth daily.   fluticasone (FLONASE) 50 MCG/ACT nasal spray Place 2 sprays into both nostrils 2 (two) times  daily as needed for allergies.   guaiFENesin (MUCINEX) 600 MG 12 hr tablet Take 600 mg by mouth daily.   hydrochlorothiazide (HYDRODIURIL) 25 MG tablet Take 1 tablet (25 mg total) by mouth daily.   HYDROcodone-acetaminophen (NORCO) 10-325 MG tablet Take 1 tablet by mouth every 6 (six) hours as needed.   HYDROcodone-acetaminophen (NORCO/VICODIN) 5-325 MG tablet Take 1 tablet by mouth 4 (four) times daily as needed for moderate pain.   HYDROcodone-homatropine (HYCODAN) 5-1.5 MG/5ML syrup Take 2.5 mLs by mouth every 12 (twelve) hours as needed for cough.   methocarbamol (ROBAXIN) 500 MG tablet Take 500 mg by mouth every 6 (six) hours as needed for muscle spasms.   NON FORMULARY    ondansetron (ZOFRAN) 4 MG tablet Take 4 mg by mouth every 8 (eight) hours as needed for nausea or vomiting.   pantoprazole (PROTONIX) 40 MG  tablet Take 40 mg by mouth 2 (two) times daily.   polyethylene glycol (MIRALAX / GLYCOLAX) packet Take 17 g by mouth daily as needed for mild constipation.    potassium chloride (MICRO-K) 10 MEQ CR capsule Take 10 mEq by mouth 2 (two) times daily.    Probiotic Product (ALIGN PO) Take 1 capsule by mouth daily.   promethazine (PHENERGAN) 25 MG tablet Take 25 mg by mouth every 6 (six) hours as needed for nausea or vomiting.   traZODone (DESYREL) 50 MG tablet Take 37.5 mg by mouth at bedtime.   Past Medical History:  Diagnosis Date   Allergic rhinitis    Anemia    Anxiety    Chronic constipation    COVID-19 08/2019   Degenerative disc disease, cervical    Essential hypertension    GERD (gastroesophageal reflux disease)    H/O bronchitis    Hiatal hernia    History of echocardiogram    in epic 04-21-2019,  normal w/ ef 60-65% and mild mitral regurg, no stenosis or restriction   History of TIAs    per pt age 53;  2008;  05/ 2013;  pt stated no residual was from sleep doctor more than likely sleep deprived than actual tia   Motion sickness    cars   Nocturia    OSA on CPAP    Severe ,, CPAP 12 MM H2O- Dr Radford Pax   PONV (postoperative nausea and vomiting)    Restless leg syndrome    Rosacea    Seasonal asthma    no inhaler   Past Surgical History:  Procedure Laterality Date   ANTERIOR CERVICAL DECOMP/DISCECTOMY FUSION N/A 10/25/2017   Procedure: CERVICAL SIX- CERVICAL SEVEN ANTERIOR CERVICAL DECOMPRESSION/DISCECTOMY FUSION, INTERBODY PROSTHESIS AND ANTERIOR PLATING;  Surgeon: Newman Pies, MD;  Location: [place];  Service: Neurosurgery;  Laterality: N/A;  CERVICAL 6- CERVICAL 7 ANTERIOR CERVICAL DECOMPRESSION/DISCECTOMY FUSION, INTERBODY PROSTHESIS AND ANTERIOR PLATING   BILATERAL BENIGN BREAST BX'S  20 YRS AGO   BREAST BIOPSY Right    benign   BREAST EXCISIONAL BIOPSY Bilateral over 10 years ago   benign   CARDIAC CATHETERIZATION N/A 08/13/2016   Procedure: Right Heart Cath;   Surgeon: Larey Dresser, MD;  Location: [facility] [place] [facility];  Service: Cardiovascular;  Laterality: N/A;    normal study, no evidence pulm HTN   CARPAL TUNNEL RELEASE  2009   BILATERAL   CATARACT EXTRACTION W/PHACO Right 02/27/2020   Procedure: CATARACT EXTRACTION PHACO AND INTRAOCULAR LENS PLACEMENT (IOC) RIGHT 3.60 00:23.9;  Surgeon: Birder Robson, MD;  Location: [place];  Service: Ophthalmology;  Laterality: Right;  sleep apnea   CATARACT EXTRACTION W/PHACO Left 05/21/2020   Procedure: CATARACT EXTRACTION PHACO AND INTRAOCULAR LENS PLACEMENT (IOC) LEFT 3.69 00:30.9;  Surgeon: Birder Robson, MD;  Location: [place];  Service: Ophthalmology;  Laterality: Left;  sleep apnea   CHOLECYSTECTOMY N/A 03/25/2021   Procedure: LAPAROSCOPIC CHOLECYSTECTOMY;  Surgeon: Rolm Bookbinder, MD;  Location: [place];  Service: General;  Laterality: N/A;   CYSTOCELE REPAIR  12/11/2011   Procedure: ANTERIOR REPAIR (CYSTOCELE);  Surgeon: Claybon Jabs, MD;  Location: [facility];  Service: Urology;  Laterality: N/A;  1 1/2 hour requested for case  Anterior repair and mid Urethral Sling   MASS EXCISION N/A 07/24/2019   Procedure: EXCISION OF VAGINAL FOREIGN BODY;  Surgeon: Kathie Rhodes, MD;  Location: [place];  Service: Urology;  Laterality: N/A;   NASAL SEPTOPLASTY W/ TURBINOPLASTY Bilateral 11/18/2016   Procedure: NASAL SEPTOPLASTY WITH BILATERAL TURBINATE REDUCTION;  Surgeon: Jerrell Belfast, MD;  Location: [place];  Service: ENT;  Laterality: Bilateral;   NONINVASIVE VASCULAR CAROTID STUDY  09-14-2007   BILATERAL MILD MIX PLAQUE THROUGHOUT, NO SIGNIFICANT BILATERAL ICA STENOSIS   PUBOVAGINAL SLING  12/11/2011   Procedure: Gaynelle Arabian;  Surgeon: Claybon Jabs, MD;  Location: [facility];  Service: Urology;  Laterality: N/A;   PULLEY RELEASE RIGHT THUMB  2009   SHOULDER ARTHROSCOPY DISTAL CLAVICLE EXCISION AND OPEN ROTATOR CUFF  REPAIR Right 01-17-2019   dr supple '@SCG'$    SINUS ENDO WITH FUSION Bilateral 11/18/2016   Procedure: BILATERAL ENDOSCOPIC SINUS SURGERY;  Surgeon: Jerrell Belfast, MD;  Location: [place];  Service: ENT;  Laterality: Bilateral;   UMBILICAL HERNIA REPAIR N/A 03/25/2021   Procedure: Primary REPAIR UMBILICAL HERNIA;  Surgeon: Rolm Bookbinder, MD;  Location: [place];  Service: General;  Laterality: N/A;   VAGINAL HYSTERECTOMY  AGE 18   Social History   Social History Narrative   Lives with husband.    Retired Web designer   Has 2 children.   family history includes Alzheimer's disease in her mother; CAD in her brother; Colon cancer in her maternal aunt; Diabetes in her brother, mother, and sister; Heart attack in her brother and paternal grandfather; Hypertension in her sister; Leukemia in her brother; Stroke in her mother and paternal grandmother. She was adopted.   Review of Systems As per HPI insomnia back pain seasonal allergies fairly severe followed by allergy specialist all other review of systems negative  Objective:   Physical Exam BP 134/72   Pulse 76   Ht 5' 3.78" (1.62 m)   Wt 172 lb (78 kg)   BMI 29.73 kg/m  NAD Anicteric Lungs cta Cor NL S1S2 no rmg Abd sl obese, soft NT - small umbilical hernia suspected on left status post repair  Alert and oriented x 3 Appropriate mood and affect   Data reviewed as per HPI I have reviewed barium swallow report and images, ultrasound report and images operative note from cholecystectomy and I am requesting records from Dr. Watt Climes regarding all EGDs colonoscopies pathology reports GI notes and laboratory tests

## 2021-08-11 NOTE — Patient Instructions (Addendum)
You have been scheduled for a Barium Esophogram at [facility] (1st floor of the hospital) on 08/15/21 at 11:00am. Please arrive 15 minutes prior to your appointment for registration. Make certain not to have anything to eat or drink 3 hours prior to your test. If you need to reschedule for any reason, please contact radiology at [phone] to do so. __________________________________________________________________ A barium swallow is an examination that concentrates on views of the esophagus. This tends to be a double contrast exam (barium and two liquids which, when combined, create a gas to distend the wall of the oesophagus) or single contrast (non-ionic iodine based). The study is usually tailored to your symptoms so a good history is essential. Attention is paid during the study to the form, structure and configuration of the esophagus, looking for functional disorders (such as aspiration, dysphagia, achalasia, motility and reflux) EXAMINATION You may be asked to change into a gown, depending on the type of swallow being performed. A radiologist and radiographer will perform the procedure. The radiologist will advise you of the type of contrast selected for your procedure and direct you during the exam. You will be asked to stand, sit or lie in several different positions and to hold a small amount of fluid in your mouth before being asked to swallow while the imaging is performed .In some instances you may be asked to swallow barium coated marshmallows to assess the motility of a solid food bolus. The exam can be recorded as a digital or video fluoroscopy procedure. POST PROCEDURE It will take 1-2 days for the barium to pass through your system. To facilitate this, it is important, unless otherwise directed, to increase your fluids for the next 24-48hrs and to resume your normal diet.  This test typically takes about 30 minutes to  perform. __________________________________________________________________________________  Dennis Bast have been scheduled for an endoscopy. Please follow written instructions given to you at your visit today. If you use inhalers (even only as needed), please bring them with you on the day of your procedure.  Your provider has requested that you go to the basement level for lab work before leaving today. Press "B" on the elevator. The lab is located at the first door on the left as you exit the elevator.   Due to recent changes in healthcare laws, you may see the results of your imaging and laboratory studies on MyChart before your provider has had a chance to review them.  We understand that in some cases there may be results that are confusing or concerning to you. Not all laboratory results come back in the same time frame and the provider may be waiting for multiple results in order to interpret others.  Please give [place] 48 hours in order for your provider to thoroughly review all the results before contacting the office for clarification of your results.   We are requesting your records from [place].  I appreciate the opportunity to care for you. Silvano Rusk, MD, [facility]

## 2021-08-13 DIAGNOSIS — G2581 Restless legs syndrome: Secondary | ICD-10-CM | POA: Diagnosis not present

## 2021-08-13 DIAGNOSIS — G4721 Circadian rhythm sleep disorder, delayed sleep phase type: Secondary | ICD-10-CM | POA: Diagnosis not present

## 2021-08-13 DIAGNOSIS — G4733 Obstructive sleep apnea (adult) (pediatric): Secondary | ICD-10-CM | POA: Diagnosis not present

## 2021-08-14 DIAGNOSIS — H43813 Vitreous degeneration, bilateral: Secondary | ICD-10-CM | POA: Diagnosis not present

## 2021-08-15 ENCOUNTER — Ambulatory Visit (HOSPITAL_COMMUNITY)
Admission: RE | Admit: 2021-08-15 | Discharge: 2021-08-15 | Disposition: A | Discharge status: Home / Self Care | Payer: Medicare Other | Source: Ambulatory Visit | Attending: Internal Medicine | Admitting: Internal Medicine

## 2021-08-15 ENCOUNTER — Other Ambulatory Visit: Payer: Self-pay | Admitting: Internal Medicine

## 2021-08-15 ENCOUNTER — Other Ambulatory Visit: Payer: Self-pay

## 2021-08-15 DIAGNOSIS — R110 Nausea: Secondary | ICD-10-CM

## 2021-08-15 DIAGNOSIS — R1310 Dysphagia, unspecified: Secondary | ICD-10-CM

## 2021-08-15 DIAGNOSIS — K449 Diaphragmatic hernia without obstruction or gangrene: Secondary | ICD-10-CM | POA: Diagnosis not present

## 2021-08-27 DIAGNOSIS — M47816 Spondylosis without myelopathy or radiculopathy, lumbar region: Secondary | ICD-10-CM | POA: Diagnosis not present

## 2021-08-27 DIAGNOSIS — M533 Sacrococcygeal disorders, not elsewhere classified: Secondary | ICD-10-CM | POA: Diagnosis not present

## 2021-08-27 DIAGNOSIS — I10 Essential (primary) hypertension: Secondary | ICD-10-CM | POA: Diagnosis not present

## 2021-08-29 ENCOUNTER — Other Ambulatory Visit: Payer: Self-pay

## 2021-08-29 ENCOUNTER — Ambulatory Visit
Admission: EM | Admit: 2021-08-29 | Discharge: 2021-08-29 | Disposition: A | Discharge status: Home / Self Care | Payer: Medicare Other | Attending: Physician Assistant | Admitting: Physician Assistant

## 2021-08-29 DIAGNOSIS — Z8616 Personal history of COVID-19: Secondary | ICD-10-CM | POA: Diagnosis not present

## 2021-08-29 DIAGNOSIS — Z20822 Contact with and (suspected) exposure to covid-19: Secondary | ICD-10-CM | POA: Diagnosis not present

## 2021-08-29 DIAGNOSIS — Z79899 Other long term (current) drug therapy: Secondary | ICD-10-CM | POA: Diagnosis not present

## 2021-08-29 DIAGNOSIS — I10 Essential (primary) hypertension: Secondary | ICD-10-CM | POA: Diagnosis not present

## 2021-08-29 DIAGNOSIS — R059 Cough, unspecified: Secondary | ICD-10-CM | POA: Diagnosis not present

## 2021-08-29 DIAGNOSIS — R5383 Other fatigue: Secondary | ICD-10-CM | POA: Diagnosis not present

## 2021-08-29 DIAGNOSIS — R0989 Other specified symptoms and signs involving the circulatory and respiratory systems: Secondary | ICD-10-CM | POA: Diagnosis not present

## 2021-08-29 DIAGNOSIS — Z881 Allergy status to other antibiotic agents status: Secondary | ICD-10-CM | POA: Diagnosis not present

## 2021-08-29 DIAGNOSIS — B349 Viral infection, unspecified: Secondary | ICD-10-CM

## 2021-08-29 DIAGNOSIS — Z880 Allergy status to penicillin: Secondary | ICD-10-CM | POA: Insufficient documentation

## 2021-08-29 DIAGNOSIS — Z882 Allergy status to sulfonamides status: Secondary | ICD-10-CM | POA: Diagnosis not present

## 2021-08-29 DIAGNOSIS — Z9049 Acquired absence of other specified parts of digestive tract: Secondary | ICD-10-CM | POA: Diagnosis not present

## 2021-08-29 LAB — RESP PANEL BY RT-PCR (FLU A&B, COVID) ARPGX2
Influenza A by PCR: NEGATIVE
Influenza B by PCR: NEGATIVE
SARS Coronavirus 2 by RT PCR: NEGATIVE

## 2021-08-29 MED ORDER — PSEUDOEPH-BROMPHEN-DM 30-2-10 MG/5ML PO SYRP: 10.0000 mL | ORAL_SOLUTION | Freq: Four times a day (QID) | ORAL | 0 refills | Status: AC | PRN

## 2021-08-29 NOTE — ED Triage Notes (Signed)
Pt here with C/O cough chest congestion since Monday, mild body aches with headache.

## 2021-08-29 NOTE — Discharge Instructions (Signed)
-We have obtained a COVID test today.  I will call you if results are positive.  Results should come through within the hour.  It will be on your MyChart.  If negative this is likely another viral illness and will need to run its course over 7 to 10 days.  I sent cough medication for you.  See more information about COVID below. -You should return if your symptoms go beyond 10 days, you have persistent fevers, you develop sinus pain, chest pain or breathing difficulty.  You have received COVID testing today either for positive exposure, concerning symptoms that could be related to COVID infection, screening purposes, or re-testing after confirmed positive.  Your test obtained today checks for active viral infection in the last 1-2 weeks. If your test is negative now, you can still test positive later. So, if you do develop symptoms you should either get re-tested and/or isolate x 5 days and then strict mask use x 5 days (unvaccinated) or mask use x 10 days (vaccinated). Please follow CDC guidelines.  While Rapid antigen tests come back in 15-20 minutes, send out PCR/molecular test results typically come back within 1-3 days. In the mean time, if you are symptomatic, assume this could be a positive test and treat/monitor yourself as if you do have COVID.   We will call with test results if positive. Please download the MyChart app and set up a profile to access test results.   If symptomatic, go home and rest. Push fluids. Take Tylenol as needed for discomfort. Gargle warm salt water. Throat lozenges. Take Mucinex DM or Robitussin for cough. Humidifier in bedroom to ease coughing. Warm showers. Also review the COVID handout for more information.  COVID-19 INFECTION: The incubation period of COVID-19 is approximately 14 days after exposure, with most symptoms developing in roughly 4-5 days. Symptoms may range in severity from mild to critically severe. Roughly 80% of those infected will have mild symptoms.  People of any age may become infected with COVID-19 and have the ability to transmit the virus. The most common symptoms include: fever, fatigue, cough, body aches, headaches, sore throat, nasal congestion, shortness of breath, nausea, vomiting, diarrhea, changes in smell and/or taste.    COURSE OF ILLNESS Some patients may begin with mild disease which can progress quickly into critical symptoms. If your symptoms are worsening please call ahead to the Emergency Department and proceed there for further treatment. Recovery time appears to be roughly 1-2 weeks for mild symptoms and 3-6 weeks for severe disease.   GO IMMEDIATELY TO ER FOR FEVER YOU ARE UNABLE TO GET DOWN WITH TYLENOL, BREATHING PROBLEMS, CHEST PAIN, FATIGUE, LETHARGY, INABILITY TO EAT OR DRINK, ETC  QUARANTINE AND ISOLATION: To help decrease the spread of COVID-19 please remain isolated if you have COVID infection or are highly suspected to have COVID infection. This means -stay home and isolate to one room in the home if you live with others. Do not share a bed or bathroom with others while ill, sanitize and wipe down all countertops and keep common areas clean and disinfected. Stay home for 5 days. If you have no symptoms or your symptoms are resolving after 5 days, you can leave your house. Continue to wear a mask around others for 5 additional days. If you have been in close contact (within 6 feet) of someone diagnosed with COVID 19, you are advised to quarantine in your home for 14 days as symptoms can develop anywhere from 2-14 days after exposure  to the virus. If you develop symptoms, you  must isolate.  Most current guidelines for COVID after exposure -unvaccinated: isolate 5 days and strict mask use x 5 days. Test on day 5 is possible -vaccinated: wear mask x 10 days if symptoms do not develop -You do not necessarily need to be tested for COVID if you have + exposure and  develop symptoms. Just isolate at home x10 days from symptom  onset During this global pandemic, CDC advises to practice social distancing, try to stay at least 65ft away from others at all times. Wear a face covering. Wash and sanitize your hands regularly and avoid going anywhere that is not necessary.  KEEP IN MIND THAT THE COVID TEST IS NOT 100% ACCURATE AND YOU SHOULD STILL DO EVERYTHING TO PREVENT POTENTIAL SPREAD OF VIRUS TO OTHERS (WEAR MASK, WEAR GLOVES, [place] HANDS AND SANITIZE REGULARLY). IF INITIAL TEST IS NEGATIVE, THIS MAY NOT MEAN YOU ARE DEFINITELY NEGATIVE. MOST ACCURATE TESTING IS DONE 5-7 DAYS AFTER EXPOSURE.   It is not advised by CDC to get re-tested after receiving a positive COVID test since you can still test positive for weeks to months after you have already cleared the virus.   *If you have not been vaccinated for COVID, I strongly suggest you consider getting vaccinated as long as there are no contraindications.

## 2021-08-29 NOTE — ED Provider Notes (Signed)
[facility]    CSN: [id number] Arrival date & time: 08/29/21  1034      History   Chief Complaint Chief Complaint  Patient presents with   Generalized Body Aches   Cough    HPI Amy Vang is a 75 y.o. female presenting for 4-day history of fatigue, headaches, cough, runny nose, body aches which are worse at night.  Patient says her husband is ill with similar symptoms.  She and her husband had negative COVID test at home yesterday.  Has had fever up to 101 degrees.  She has been taking Tylenol consistently and temperature is currently 98.3 degrees.  Has also been taking Mucinex.  No COVID exposure and she has been vaccinated for COVID-19 x4.  Patient denies any breathing difficulty, weakness, vomiting or diarrhea.  Medical history significant for hypertension, seasonal asthma, history of TIAs, and history of COVID-19.  No other complaints.  HPI  Past Medical History:  Diagnosis Date   Allergic rhinitis    Anemia    Anxiety    Chronic constipation    COVID-19 08/2019   Degenerative disc disease, cervical    Essential hypertension    GERD (gastroesophageal reflux disease)    H/O bronchitis    Hiatal hernia    History of echocardiogram    in epic 04-21-2019,  normal w/ ef 60-65% and mild mitral regurg, no stenosis or restriction   History of TIAs    per pt age 4;  2008;  05/ 2013;  pt stated no residual was from sleep doctor more than likely sleep deprived than actual tia   Motion sickness    cars   Nocturia    OSA on CPAP    Severe ,, CPAP 12 MM H2O- Dr Radford Pax   PONV (postoperative nausea and vomiting)    Restless leg syndrome    Rosacea    Seasonal asthma    no inhaler    Patient Active Problem List   Diagnosis Date Noted   Lower limb pain, inferior, right 09/19/2020   Lumbosacral radiculitis 09/19/2020   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   Encounter for orthopedic follow-up care 01/17/2019   Partial thickness rotator cuff tear 11/07/2018    Acquired trigger finger 10/28/2018   Pain in joint of right shoulder 05/06/2018   Hoarseness 12/22/2017   Cervical spondylosis with radiculopathy 10/25/2017   Neck pain 08/31/2017   Other cervical disc displacement, unspecified cervical region 08/31/2017   Sinusitis, chronic 11/18/2016   Deviated septum 09/18/2016   Dyspnea and respiratory abnormality 05/21/2016   Wheeze 05/21/2016   Chronic throat clearing 05/21/2016   Pulmonary HTN ([place]) 04/23/2016   Moderate persistent asthma 11/12/2015   Allergic rhinoconjunctivitis 11/12/2015   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   SOB (shortness of breath) 03/06/2015   OSA (obstructive sleep apnea) 01/01/2014   Benign essential HTN 01/01/2014   Obesity 01/01/2014    Past Surgical History:  Procedure Laterality Date   ANTERIOR CERVICAL DECOMP/DISCECTOMY FUSION N/A 10/25/2017   Procedure: CERVICAL SIX- CERVICAL SEVEN ANTERIOR CERVICAL DECOMPRESSION/DISCECTOMY FUSION, INTERBODY PROSTHESIS AND ANTERIOR PLATING;  Surgeon: Newman Pies, MD;  Location: [place];  Service: Neurosurgery;  Laterality: N/A;  CERVICAL 6- CERVICAL 7 ANTERIOR CERVICAL DECOMPRESSION/DISCECTOMY FUSION, INTERBODY PROSTHESIS AND ANTERIOR PLATING   BILATERAL BENIGN BREAST BX'S  20 YRS AGO   BREAST BIOPSY Right    benign   BREAST EXCISIONAL BIOPSY Bilateral over 10 years ago   benign   CARDIAC CATHETERIZATION N/A 08/13/2016   Procedure: Right Heart Cath;  Surgeon: Larey Dresser, MD;  Location: [facility];  Service: Cardiovascular;  Laterality: N/A;    normal study, no evidence pulm HTN   CARPAL TUNNEL RELEASE  2009   BILATERAL   CATARACT EXTRACTION W/PHACO Right 02/27/2020   Procedure: CATARACT EXTRACTION PHACO AND INTRAOCULAR LENS PLACEMENT (IOC) RIGHT 3.60 00:23.9;  Surgeon: Birder Robson, MD;  Location: [place];  Service: Ophthalmology;  Laterality: Right;  sleep apnea   CATARACT EXTRACTION W/PHACO Left 05/21/2020   Procedure: CATARACT EXTRACTION PHACO  AND INTRAOCULAR LENS PLACEMENT (IOC) LEFT 3.69 00:30.9;  Surgeon: Birder Robson, MD;  Location: [place];  Service: Ophthalmology;  Laterality: Left;  sleep apnea   CHOLECYSTECTOMY N/A 03/25/2021   Procedure: LAPAROSCOPIC CHOLECYSTECTOMY;  Surgeon: Rolm Bookbinder, MD;  Location: [place];  Service: General;  Laterality: N/A;   CYSTOCELE REPAIR  12/11/2011   Procedure: ANTERIOR REPAIR (CYSTOCELE);  Surgeon: Claybon Jabs, MD;  Location: [facility];  Service: Urology;  Laterality: N/A;  1 1/2 hour requested for case  Anterior repair and mid Urethral Sling   MASS EXCISION N/A 07/24/2019   Procedure: EXCISION OF VAGINAL FOREIGN BODY;  Surgeon: Kathie Rhodes, MD;  Location: [place];  Service: Urology;  Laterality: N/A;   NASAL SEPTOPLASTY W/ TURBINOPLASTY Bilateral 11/18/2016   Procedure: NASAL SEPTOPLASTY WITH BILATERAL TURBINATE REDUCTION;  Surgeon: Jerrell Belfast, MD;  Location: [place];  Service: ENT;  Laterality: Bilateral;   NONINVASIVE VASCULAR CAROTID STUDY  09-14-2007   BILATERAL MILD MIX PLAQUE THROUGHOUT, NO SIGNIFICANT BILATERAL ICA STENOSIS   PUBOVAGINAL SLING  12/11/2011   Procedure: Gaynelle Arabian;  Surgeon: Claybon Jabs, MD;  Location[facility];  Service: Urology;  Laterality: N/A;   PULLEY RELEASE RIGHT THUMB  2009   SHOULDER ARTHROSCOPY DISTAL CLAVICLE EXCISION AND OPEN ROTATOR CUFF REPAIR Right 01-17-2019   dr supple @[facility]    SINUS ENDO WITH FUSION Bilateral 11/18/2016   Procedure: BILATERAL ENDOSCOPIC SINUS SURGERY;  Surgeon: Jerrell Belfast, MD;  Location: [place];  Service: ENT;  Laterality: Bilateral;   UMBILICAL HERNIA REPAIR N/A 03/25/2021   Procedure: Primary REPAIR UMBILICAL HERNIA;  Surgeon: Rolm Bookbinder, MD;  Location: [place];  Service: General;  Laterality: N/A;   VAGINAL HYSTERECTOMY  AGE 55    OB History   No obstetric history on file.      Home Medications    Prior to Admission  medications   Medication Sig Start Date End Date Taking? Authorizing Provider  acetaminophen (TYLENOL) 650 MG CR tablet Take 1,300 mg by mouth at bedtime.   Yes [provider]  brompheniramine-pseudoephedrine-DM 30-2-10 MG/5ML syrup Take 10 mLs by mouth 4 (four) times daily as needed for up to 7 days. 08/29/21 09/05/21 Yes Danton Clap, PA-C  citalopram (CELEXA) 20 MG tablet Take 20 mg by mouth daily. 10/17/15  Yes [provider]  clonazePAM (KLONOPIN) 0.5 MG tablet Take 0.5 mg by mouth at bedtime.   Yes [provider]  cyclobenzaprine (FLEXERIL) 10 MG tablet Take 1 tablet (10 mg total) by mouth 3 (three) times daily as needed for muscle spasms. 10/25/17  Yes Newman Pies, MD  dicyclomine (BENTYL) 20 MG tablet Take 20 mg by mouth 4 (four) times daily as needed for spasms. 06/17/20  Yes [provider]  famotidine (PEPCID) 40 MG tablet Take 40 mg by mouth at bedtime.   Yes [provider]  fexofenadine (ALLEGRA) 180 MG tablet Take 180 mg by mouth daily.  Yes [provider]  fluticasone (FLONASE) 50 MCG/ACT nasal spray Place 2 sprays into both nostrils 2 (two) times daily as needed for allergies.   Yes [provider]  guaiFENesin (MUCINEX) 600 MG 12 hr tablet Take 600 mg by mouth daily.   Yes [provider]  hydrochlorothiazide (HYDRODIURIL) 25 MG tablet Take 1 tablet (25 mg total) by mouth daily. 05/30/19  Yes Gollan, Kathlene November, MD  ondansetron (ZOFRAN) 4 MG tablet Take 4 mg by mouth every 8 (eight) hours as needed for nausea or vomiting.   Yes [provider]  pantoprazole (PROTONIX) 40 MG tablet Take 40 mg by mouth 2 (two) times daily. 09/21/20  Yes [provider]  polyethylene glycol (MIRALAX / GLYCOLAX) packet Take 17 g by mouth daily as needed for mild constipation.    Yes [provider]  potassium chloride (MICRO-K) 10 MEQ CR capsule Take 10 mEq by mouth 2 (two) times daily.  03/23/15   Yes [provider]  Probiotic Product (ALIGN PO) Take 1 capsule by mouth daily.   Yes [provider]  promethazine (PHENERGAN) 25 MG tablet Take 25 mg by mouth every 6 (six) hours as needed for nausea or vomiting.   Yes [provider]  traZODone (DESYREL) 50 MG tablet Take 37.5 mg by mouth at bedtime.   Yes [provider]  methocarbamol (ROBAXIN) 500 MG tablet Take 500 mg by mouth every 6 (six) hours as needed for muscle spasms.    [provider]  NON FORMULARY     [provider]    Family History Family History  Adopted: Yes  Problem Relation Age of Onset   Diabetes Mother    Stroke Mother    Alzheimer's disease Mother    Hypertension Sister    Diabetes Sister    Heart attack Brother    Leukemia Brother    CAD Brother    Diabetes Brother    Colon cancer Maternal Aunt    Stroke Paternal Grandmother    Heart attack Paternal Grandfather     Social History Social History   Tobacco Use   Smoking status: Never   Smokeless tobacco: Never  Vaping Use   Vaping Use: Never used  Substance Use Topics   Alcohol use: No   Drug use: No     Allergies   Hydroxyzine hcl, Promethazine hcl, Ciprofloxacin, Lactose intolerance (gi), Penicillins, and Septra [sulfamethoxazole-trimethoprim]   Review of Systems Review of Systems  Constitutional:  Positive for fatigue and fever. Negative for chills and diaphoresis.  HENT:  Positive for congestion and rhinorrhea. Negative for ear pain, sinus pressure, sinus pain and sore throat.   Respiratory:  Positive for cough. Negative for shortness of breath.   Cardiovascular:  Negative for chest pain.  Gastrointestinal:  Negative for abdominal pain, nausea and vomiting.  Musculoskeletal:  Positive for myalgias. Negative for arthralgias.  Skin:  Negative for rash.  Neurological:  Positive for headaches. Negative for weakness.  Hematological:  Negative for adenopathy.    Physical  Exam Triage Vital Signs ED Triage Vitals  Enc Vitals Group     BP 08/29/21 1046 138/62     Pulse Rate 08/29/21 1046 87     Resp 08/29/21 1046 18     Temp 08/29/21 1046 98.3 F (36.8 C)     Temp Source 08/29/21 1046 Oral     SpO2 08/29/21 1046 98 %     Weight 08/29/21 1043 172 lb (78 kg)  Height 08/29/21 1043 5' 3.5" (1.613 m)     Head Circumference --      Peak Flow --      Pain Score 08/29/21 1043 6     Pain Loc --      Pain Edu? --      Excl. in [place]? --    No data found.  Updated Vital Signs BP 138/62 (BP Location: Left Arm)   Pulse 87   Temp 98.3 F (36.8 C) (Oral)   Resp 18   Ht 5' 3.5" (1.613 m)   Wt 172 lb (78 kg)   SpO2 98%   BMI 29.99 kg/m      Physical Exam Vitals and nursing note reviewed.  Constitutional:      General: She is not in acute distress.    Appearance: Normal appearance. She is not ill-appearing or toxic-appearing.  HENT:     Head: Normocephalic and atraumatic.     Right Ear: Tympanic membrane, ear canal and external ear normal.     Left Ear: Tympanic membrane, ear canal and external ear normal.     Nose: Congestion and rhinorrhea present.     Mouth/Throat:     Mouth: Mucous membranes are moist.     Pharynx: Oropharynx is clear.  Eyes:     General: No scleral icterus.       Right eye: No discharge.        Left eye: No discharge.     Conjunctiva/sclera: Conjunctivae normal.  Cardiovascular:     Rate and Rhythm: Normal rate and regular rhythm.     Heart sounds: Normal heart sounds.  Pulmonary:     Effort: Pulmonary effort is normal. No respiratory distress.     Breath sounds: Normal breath sounds. No wheezing, rhonchi or rales.  Musculoskeletal:     Cervical back: Neck supple.  Skin:    General: Skin is dry.  Neurological:     General: No focal deficit present.     Mental Status: She is alert. Mental status is at baseline.     Motor: No weakness.     Gait: Gait normal.  Psychiatric:        Mood and Affect: Mood normal.         Behavior: Behavior normal.        Thought Content: Thought content normal.     UC Treatments / Results  Labs (all labs ordered are listed, but only abnormal results are displayed) Labs Reviewed  RESP PANEL BY RT-PCR (FLU A&B, COVID) [id number]    EKG   Radiology No results found.  Procedures Procedures (including critical care time)  Medications Ordered in UC Medications - No data to display  Initial Impression / Assessment and Plan / UC Course  I have reviewed the triage vital signs and the nursing notes.  Pertinent labs & imaging results that were available during my care of the patient were reviewed by me and considered in my medical decision making (see chart for details).  75 year old female presenting for 4-day history of fatigue, body aches, headaches, fever, cough and congestion.  Vitals all stable and patient overall well-appearing.  Exam significant only for minor congestion.  Chest is clear to auscultation heart regular rate and rhythm.  Respiratory panel obtained today to assess for influenza and COVID-19.  Patient should start antiviral medication ASAP if she is COVID-positive.  Advised patient I will call her with the test result if she is positive.  If she does not hear from  me this is likely another viral illness.  If COVID-positive, did discuss current CDC guidelines, isolation protocol and ED precautions.  I have sent Bromfed-DM to pharmacy for cough and advised her to increase rest and fluids.  Return to ED precautions reviewed.  All negative respiratory panel.  Final Clinical Impressions(s) / UC Diagnoses   Final diagnoses:  Viral illness  Cough  Runny nose  Fatigue, unspecified type     Discharge Instructions      -We have obtained a COVID test today.  I will call you if results are positive.  Results should come through within the hour.  It will be on your MyChart.  If negative this is likely another viral illness and will need to run its course  over 7 to 10 days.  I sent cough medication for you.  See more information about COVID below. -You should return if your symptoms go beyond 10 days, you have persistent fevers, you develop sinus pain, chest pain or breathing difficulty.  You have received COVID testing today either for positive exposure, concerning symptoms that could be related to COVID infection, screening purposes, or re-testing after confirmed positive.  Your test obtained today checks for active viral infection in the last 1-2 weeks. If your test is negative now, you can still test positive later. So, if you do develop symptoms you should either get re-tested and/or isolate x 5 days and then strict mask use x 5 days (unvaccinated) or mask use x 10 days (vaccinated). Please follow CDC guidelines.  While Rapid antigen tests come back in 15-20 minutes, send out PCR/molecular test results typically come back within 1-3 days. In the mean time, if you are symptomatic, assume this could be a positive test and treat/monitor yourself as if you do have COVID.   We will call with test results if positive. Please download the MyChart app and set up a profile to access test results.   If symptomatic, go home and rest. Push fluids. Take Tylenol as needed for discomfort. Gargle warm salt water. Throat lozenges. Take Mucinex DM or Robitussin for cough. Humidifier in bedroom to ease coughing. Warm showers. Also review the COVID handout for more information.  COVID-19 INFECTION: The incubation period of COVID-19 is approximately 14 days after exposure, with most symptoms developing in roughly 4-5 days. Symptoms may range in severity from mild to critically severe. Roughly 80% of those infected will have mild symptoms. People of any age may become infected with COVID-19 and have the ability to transmit the virus. The most common symptoms include: fever, fatigue, cough, body aches, headaches, sore throat, nasal congestion, shortness of breath, nausea,  vomiting, diarrhea, changes in smell and/or taste.    COURSE OF ILLNESS Some patients may begin with mild disease which can progress quickly into critical symptoms. If your symptoms are worsening please call ahead to the Emergency Department and proceed there for further treatment. Recovery time appears to be roughly 1-2 weeks for mild symptoms and 3-6 weeks for severe disease.   GO IMMEDIATELY TO ER FOR FEVER YOU ARE UNABLE TO GET DOWN WITH TYLENOL, BREATHING PROBLEMS, CHEST PAIN, FATIGUE, LETHARGY, INABILITY TO EAT OR DRINK, ETC  QUARANTINE AND ISOLATION: To help decrease the spread of COVID-19 please remain isolated if you have COVID infection or are highly suspected to have COVID infection. This means -stay home and isolate to one room in the home if you live with others. Do not share a bed or bathroom with others while ill, sanitize  and wipe down all countertops and keep common areas clean and disinfected. Stay home for 5 days. If you have no symptoms or your symptoms are resolving after 5 days, you can leave your house. Continue to wear a mask around others for 5 additional days. If you have been in close contact (within 6 feet) of someone diagnosed with COVID 19, you are advised to quarantine in your home for 14 days as symptoms can develop anywhere from 2-14 days after exposure to the virus. If you develop symptoms, you  must isolate.  Most current guidelines for COVID after exposure -unvaccinated: isolate 5 days and strict mask use x 5 days. Test on day 5 is possible -vaccinated: wear mask x 10 days if symptoms do not develop -You do not necessarily need to be tested for COVID if you have + exposure and  develop symptoms. Just isolate at home x10 days from symptom onset During this global pandemic, CDC advises to practice social distancing, try to stay at least 8ft away from others at all times. Wear a face covering. Wash and sanitize your hands regularly and avoid going anywhere that is not  necessary.  KEEP IN MIND THAT THE COVID TEST IS NOT 100% ACCURATE AND YOU SHOULD STILL DO EVERYTHING TO PREVENT POTENTIAL SPREAD OF VIRUS TO OTHERS (WEAR MASK, WEAR GLOVES, [place] HANDS AND SANITIZE REGULARLY). IF INITIAL TEST IS NEGATIVE, THIS MAY NOT MEAN YOU ARE DEFINITELY NEGATIVE. MOST ACCURATE TESTING IS DONE 5-7 DAYS AFTER EXPOSURE.   It is not advised by CDC to get re-tested after receiving a positive COVID test since you can still test positive for weeks to months after you have already cleared the virus.   *If you have not been vaccinated for COVID, I strongly suggest you consider getting vaccinated as long as there are no contraindications.       ED Prescriptions     Medication Sig Dispense Auth. Provider   brompheniramine-pseudoephedrine-DM 30-2-10 MG/5ML syrup Take 10 mLs by mouth 4 (four) times daily as needed for up to 7 days. 150 mL Danton Clap, PA-C      PDMP not reviewed this encounter.   Danton Clap, PA-C 08/29/21 1208

## 2021-09-08 ENCOUNTER — Encounter: Payer: Medicare Other | Admitting: Internal Medicine

## 2021-09-09 DIAGNOSIS — M5416 Radiculopathy, lumbar region: Secondary | ICD-10-CM | POA: Diagnosis not present

## 2021-09-18 DIAGNOSIS — E7800 Pure hypercholesterolemia, unspecified: Secondary | ICD-10-CM | POA: Diagnosis not present

## 2021-09-18 DIAGNOSIS — I10 Essential (primary) hypertension: Secondary | ICD-10-CM | POA: Diagnosis not present

## 2021-09-18 DIAGNOSIS — G4700 Insomnia, unspecified: Secondary | ICD-10-CM | POA: Diagnosis not present

## 2021-09-18 DIAGNOSIS — I272 Pulmonary hypertension, unspecified: Secondary | ICD-10-CM | POA: Diagnosis not present

## 2021-09-18 DIAGNOSIS — K219 Gastro-esophageal reflux disease without esophagitis: Secondary | ICD-10-CM | POA: Diagnosis not present

## 2021-09-23 DIAGNOSIS — J3089 Other allergic rhinitis: Secondary | ICD-10-CM | POA: Diagnosis not present

## 2021-09-23 DIAGNOSIS — R053 Chronic cough: Secondary | ICD-10-CM | POA: Diagnosis not present

## 2021-09-23 DIAGNOSIS — J069 Acute upper respiratory infection, unspecified: Secondary | ICD-10-CM | POA: Diagnosis not present

## 2021-09-25 ENCOUNTER — Other Ambulatory Visit: Payer: Self-pay | Admitting: General Surgery

## 2021-09-25 DIAGNOSIS — K432 Incisional hernia without obstruction or gangrene: Secondary | ICD-10-CM | POA: Diagnosis not present

## 2021-10-01 DIAGNOSIS — G4733 Obstructive sleep apnea (adult) (pediatric): Secondary | ICD-10-CM | POA: Diagnosis not present

## 2021-10-03 ENCOUNTER — Encounter: Payer: Medicare Other | Admitting: Internal Medicine

## 2021-10-07 DIAGNOSIS — J069 Acute upper respiratory infection, unspecified: Secondary | ICD-10-CM | POA: Diagnosis not present

## 2021-10-07 DIAGNOSIS — K219 Gastro-esophageal reflux disease without esophagitis: Secondary | ICD-10-CM | POA: Diagnosis not present

## 2021-10-07 DIAGNOSIS — N390 Urinary tract infection, site not specified: Secondary | ICD-10-CM | POA: Diagnosis not present

## 2021-10-07 DIAGNOSIS — R490 Dysphonia: Secondary | ICD-10-CM | POA: Diagnosis not present

## 2021-10-14 ENCOUNTER — Encounter: Payer: Self-pay | Admitting: Internal Medicine

## 2021-10-14 ENCOUNTER — Other Ambulatory Visit: Payer: Self-pay

## 2021-10-14 ENCOUNTER — Ambulatory Visit (AMBULATORY_SURGERY_CENTER): Payer: Medicare Other | Admitting: Internal Medicine

## 2021-10-14 VITALS — BP 120/72 | HR 60 | Temp 97.4°F | Resp 18 | Ht 63.0 in | Wt 172.0 lb

## 2021-10-14 DIAGNOSIS — R1013 Epigastric pain: Secondary | ICD-10-CM | POA: Diagnosis not present

## 2021-10-14 DIAGNOSIS — R1310 Dysphagia, unspecified: Secondary | ICD-10-CM | POA: Diagnosis not present

## 2021-10-14 DIAGNOSIS — K319 Disease of stomach and duodenum, unspecified: Secondary | ICD-10-CM | POA: Diagnosis not present

## 2021-10-14 DIAGNOSIS — K449 Diaphragmatic hernia without obstruction or gangrene: Secondary | ICD-10-CM

## 2021-10-14 DIAGNOSIS — K317 Polyp of stomach and duodenum: Secondary | ICD-10-CM | POA: Diagnosis not present

## 2021-10-14 MED ORDER — SODIUM CHLORIDE 0.9 % IV SOLN: 500.0000 mL | Freq: Once | INTRAVENOUS | Status: DC

## 2021-10-14 NOTE — Patient Instructions (Addendum)
There stomach lining was red in one area - I took biopsies. Checking for infection or other causes.  There was a tiny stomach polyp - I took biopsies.  Overall nothing dangerous or bad seen.  I will contact you with results and recommendations.  I hope you have a nice birthday tomorrow.  I appreciate the opportunity to care for you. Gatha Mayer, MD, [facility]  Discharge instructions given. Handout on Hiatal Hernia. Resume previous medications. YOU HAD AN ENDOSCOPIC PROCEDURE TODAY AT [facility]:   Refer to the procedure report that was given to you for any specific questions about what was found during the examination.  If the procedure report does not answer your questions, please call your gastroenterologist to clarify.  If you requested that your care partner not be given the details of your procedure findings, then the procedure report has been included in a sealed envelope for you to review at your convenience later.  YOU SHOULD EXPECT: Some feelings of bloating in the abdomen. Passage of more gas than usual.  Walking can help get rid of the air that was put into your GI tract during the procedure and reduce the bloating. If you had a lower endoscopy (such as a colonoscopy or flexible sigmoidoscopy) you may notice spotting of blood in your stool or on the toilet paper. If you underwent a bowel prep for your procedure, you may not have a normal bowel movement for a few days.  Please Note:  You might notice some irritation and congestion in your nose or some drainage.  This is from the oxygen used during your procedure.  There is no need for concern and it should clear up in a day or so.  SYMPTOMS TO REPORT IMMEDIATELY:  Following upper endoscopy (EGD)  Vomiting of blood or coffee ground material  New chest pain or pain under the shoulder blades  Painful or persistently difficult swallowing  New shortness of breath  Fever of 100F or higher  Black, tarry-looking  stools  For urgent or emergent issues, a gastroenterologist can be reached at any hour by calling [phone]. Do not use MyChart messaging for urgent concerns.    DIET:  We do recommend a small meal at first, but then you may proceed to your regular diet.  Drink plenty of fluids but you should avoid alcoholic beverages for 24 hours.  ACTIVITY:  You should plan to take it easy for the rest of today and you should NOT DRIVE or use heavy machinery until tomorrow (because of the sedation medicines used during the test).    FOLLOW UP: Our staff will call the number listed on your records 48-72 hours following your procedure to check on you and address any questions or concerns that you may have regarding the information given to you following your procedure. If we do not reach you, we will leave a message.  We will attempt to reach you two times.  During this call, we will ask if you have developed any symptoms of COVID 19. If you develop any symptoms (ie: fever, flu-like symptoms, shortness of breath, cough etc.) before then, please call [phone].  If you test positive for Covid 19 in the 2 weeks post procedure, please call and report this information to [place].    If any biopsies were taken you will be contacted by phone or by letter within the next 1-3 weeks.  Please call us at [phone] if you have not heard about the  biopsies in 3 weeks.    SIGNATURES/CONFIDENTIALITY: You and/or your care partner have signed paperwork which will be entered into your electronic medical record.  These signatures attest to the fact that that the information above on your After Visit Summary has been reviewed and is understood.  Full responsibility of the confidentiality of this discharge information lies with you and/or your care-partner.

## 2021-10-14 NOTE — Op Note (Signed)
[facility] Patient Name: Amy Vang Procedure Date: 10/14/2021 7:51 AM MRN: [id number] Endoscopist: Gatha Mayer , MD Age: 75 Referring MD:  Date of Birth: [birth year] Gender: Female Account #: [id number] Procedure:                Upper GI endoscopy Indications:              Epigastric abdominal pain Medicines:                Propofol per Anesthesia, Monitored Anesthesia Care Procedure:                Pre-Anesthesia Assessment:                           - Prior to the procedure, a History and Physical                            was performed, and patient medications and                            allergies were reviewed. The patient's tolerance of                            previous anesthesia was also reviewed. The risks                            and benefits of the procedure and the sedation                            options and risks were discussed with the patient.                            All questions were answered, and informed consent                            was obtained. Prior Anticoagulants: The patient has                            taken no previous anticoagulant or antiplatelet                            agents. ASA Grade Assessment: II - A patient with                            mild systemic disease. After reviewing the risks                            and benefits, the patient was deemed in                            satisfactory condition to undergo the procedure.                           After obtaining informed consent, the endoscope was  passed under direct vision. Throughout the                            procedure, the patient's blood pressure, pulse, and                            oxygen saturations were monitored continuously. The                            Endoscope was introduced through the mouth, and                            advanced to the second part of duodenum. The upper                             GI endoscopy was accomplished without difficulty.                            The patient tolerated the procedure well. Scope In: Scope Out: Findings:                 The examined esophagus was mildly tortuous.                           Patchy moderately erythematous mucosa without                            bleeding was found in the gastric antrum. Biopsies                            were taken with a cold forceps for histology.                            Verification of patient identification for the                            specimen was done. Estimated blood loss was minimal.                           A single diminutive sessile polyp was found in the                            cardia. Biopsies were taken with a cold forceps for                            histology. Verification of patient identification                            for the specimen was done. Estimated blood loss was                            minimal.                           A  small sliding hiatal hernia was found.                           The exam was otherwise without abnormality. Complications:            No immediate complications. Estimated Blood Loss:     Estimated blood loss was minimal. Impression:               - Tortuous esophagus.                           - Erythematous mucosa in the antrum. Biopsied.                           - A single gastric polyp. Biopsied.                           - Small sliding hiatal hernia.                           - The examination was otherwise normal. Recommendation:           - Patient has a contact number available for                            emergencies. The signs and symptoms of potential                            delayed complications were discussed with the                            patient. Return to normal activities tomorrow.                            Written discharge instructions were provided to the                            patient.                            - Resume previous diet.                           - Continue present medications.                           - Await pathology results. Gatha Mayer, MD 10/14/2021 8:28:36 AM This report has been signed electronically.

## 2021-10-14 NOTE — Progress Notes (Signed)
VS-CW 

## 2021-10-14 NOTE — Progress Notes (Signed)
[facility] History and Physical   Primary Care Physician:  Carol Ada, MD   Reason for Procedure:   Epigastric pain  Plan:    EGD     HPI: Amy Vang is a 75 y.o. female here for evaluation of persistent epigastric pain problems. See 08/11/21 note also.  08/15/21 Ba swallow IMPRESSION: Small sliding-type hiatal hernia. No other abnormality seen in the esophagus. Past Medical History:  Diagnosis Date   Allergic rhinitis    Allergy    seasonal   Anemia    Anxiety    Cataract    removed bilateraly   Chronic constipation    COVID-19 08/2019   Degenerative disc disease, cervical    Essential hypertension    GERD (gastroesophageal reflux disease)    H/O bronchitis    Hiatal hernia    History of echocardiogram    in epic 04-21-2019,  normal w/ ef 60-65% and mild mitral regurg, no stenosis or restriction   History of TIAs    per pt age 30;  2008;  05/ 2013;  pt stated no residual was from sleep doctor more than likely sleep deprived than actual tia   Motion sickness    cars   Nocturia    OSA on CPAP    Severe ,, CPAP 12 MM H2O- Dr Radford Pax   PONV (postoperative nausea and vomiting)    Restless leg syndrome    Rosacea    Seasonal asthma    no inhaler   Sleep apnea    uses CPAP    Past Surgical History:  Procedure Laterality Date   ANTERIOR CERVICAL DECOMP/DISCECTOMY FUSION N/A 10/25/2017   Procedure: CERVICAL SIX- CERVICAL SEVEN ANTERIOR CERVICAL DECOMPRESSION/DISCECTOMY FUSION, INTERBODY PROSTHESIS AND ANTERIOR PLATING;  Surgeon: Newman Pies, MD;  Location: [facility];  Service: Neurosurgery;  Laterality: N/A;  CERVICAL 6- CERVICAL 7 ANTERIOR CERVICAL DECOMPRESSION/DISCECTOMY FUSION, INTERBODY PROSTHESIS AND ANTERIOR PLATING   BILATERAL BENIGN BREAST BX'S  20 YRS AGO   BREAST BIOPSY Right    benign   BREAST EXCISIONAL BIOPSY Bilateral over 10 years ago   benign   CARDIAC CATHETERIZATION N/A 08/13/2016   Procedure: Right Heart Cath;  Surgeon:  Larey Dresser, MD;  Location: [facility];  Service: Cardiovascular;  Laterality: N/A;    normal study, no evidence pulm HTN   CARPAL TUNNEL RELEASE  2009   BILATERAL   CATARACT EXTRACTION W/PHACO Right 02/27/2020   Procedure: CATARACT EXTRACTION PHACO AND INTRAOCULAR LENS PLACEMENT (IOC) RIGHT 3.60 00:23.9;  Surgeon: Birder Robson, MD;  Location: [facility];  Service: Ophthalmology;  Laterality: Right;  sleep apnea   CATARACT EXTRACTION W/PHACO Left 05/21/2020   Procedure: CATARACT EXTRACTION PHACO AND INTRAOCULAR LENS PLACEMENT (IOC) LEFT 3.69 00:30.9;  Surgeon: Birder Robson, MD;  Location: [facility];  Service: Ophthalmology;  Laterality: Left;  sleep apnea   CHOLECYSTECTOMY N/A 03/25/2021   Procedure: LAPAROSCOPIC CHOLECYSTECTOMY;  Surgeon: Rolm Bookbinder, MD;  Location: [facility];  Service: General;  Laterality: N/A;   CYSTOCELE REPAIR  12/11/2011   Procedure: ANTERIOR REPAIR (CYSTOCELE);  Surgeon: Claybon Jabs, MD;  Location[facility];  Service: Urology;  Laterality: N/A;  1 1/2 hour requested for case  Anterior repair and mid Urethral Sling   MASS EXCISION N/A 07/24/2019   Procedure: EXCISION OF VAGINAL FOREIGN BODY;  Surgeon: Kathie Rhodes, MD;  Location: [facility];  Service: Urology;  Laterality: N/A;   NASAL SEPTOPLASTY W/ TURBINOPLASTY Bilateral 11/18/2016   Procedure:  NASAL SEPTOPLASTY WITH BILATERAL TURBINATE REDUCTION;  Surgeon: Jerrell Belfast, MD;  Location: [facility];  Service: ENT;  Laterality: Bilateral;   NONINVASIVE VASCULAR CAROTID STUDY  09/14/2007   BILATERAL MILD MIX PLAQUE THROUGHOUT, NO SIGNIFICANT BILATERAL ICA STENOSIS   PUBOVAGINAL SLING  12/11/2011   Procedure: Gaynelle Arabian;  Surgeon: Claybon Jabs, MD;  Location: [facility];  Service: Urology;  Laterality: N/A;   PULLEY RELEASE RIGHT THUMB  2009   SHOULDER ARTHROSCOPY DISTAL CLAVICLE EXCISION AND OPEN ROTATOR CUFF REPAIR  Right 01-17-2019   dr supple @[facility]    SINUS ENDO WITH FUSION Bilateral 11/18/2016   Procedure: BILATERAL ENDOSCOPIC SINUS SURGERY;  Surgeon: Jerrell Belfast, MD;  Location: [facility];  Service: ENT;  Laterality: Bilateral;   UMBILICAL HERNIA REPAIR N/A 03/25/2021   Procedure: Primary REPAIR UMBILICAL HERNIA;  Surgeon: Rolm Bookbinder, MD;  Location: [facility];  Service: General;  Laterality: N/A;   UPPER GASTROINTESTINAL ENDOSCOPY     VAGINAL HYSTERECTOMY  AGE 52    Prior to Admission medications   Medication Sig Start Date End Date Taking? Authorizing Provider  acetaminophen (TYLENOL) 650 MG CR tablet Take 1,300 mg by mouth at bedtime.   Yes [provider]  citalopram (CELEXA) 10 MG tablet citalopram 10 mg tablet  TAKE 1 TABLET BY MOUTH EVERY DAY FOR 90 DAYS   Yes [provider]  famotidine (PEPCID) 40 MG tablet Take 40 mg by mouth at bedtime.   Yes [provider]  fexofenadine-pseudoephedrine (ALLEGRA-D) 60-120 MG 12 hr tablet Take by mouth.   Yes [provider]  hydrochlorothiazide (HYDRODIURIL) 25 MG tablet Take 1 tablet (25 mg total) by mouth daily. 05/30/19  Yes Gollan, Kathlene November, MD  ondansetron (ZOFRAN-ODT) 4 MG disintegrating tablet Take 4 mg by mouth every 6 (six) hours as needed. 09/23/21  Yes [provider]  pantoprazole (PROTONIX) 40 MG tablet Take 40 mg by mouth 2 (two) times daily. 09/21/20  Yes [provider]  potassium chloride (MICRO-K) 10 MEQ CR capsule Take 10 mEq by mouth 2 (two) times daily.  03/23/15  Yes [provider]  promethazine (PHENERGAN) 25 MG tablet Take 25 mg by mouth every 6 (six) hours as needed for nausea or vomiting.   Yes [provider]  traZODone (DESYREL) 50 MG tablet Take 37.5 mg by mouth at bedtime.   Yes [provider]  albuterol (VENTOLIN HFA) 108 (90 Base) MCG/ACT inhaler  08/14/21   [provider]  clonazePAM (KLONOPIN) 0.5 MG tablet Take 0.5 mg by mouth at  bedtime. Patient not taking: Reported on 10/14/2021    [provider]  cyclobenzaprine (FLEXERIL) 10 MG tablet Take 1 tablet (10 mg total) by mouth 3 (three) times daily as needed for muscle spasms. 10/25/17   Newman Pies, MD  dicyclomine (BENTYL) 20 MG tablet Take 20 mg by mouth 4 (four) times daily as needed for spasms. Patient not taking: Reported on 10/14/2021 06/17/20   [provider]  fluticasone (FLONASE) 50 MCG/ACT nasal spray Place 2 sprays into both nostrils 2 (two) times daily as needed for allergies.    [provider]  guaiFENesin (MUCINEX) 600 MG 12 hr tablet Take 600 mg by mouth daily.    [provider]  HYDROcodone bit-homatropine (HYCODAN) 5-1.5 MG/5ML syrup hydrocodone-homatropine 5 mg-1.5 mg/5 mL oral syrup    [provider]  HYDROcodone-acetaminophen (NORCO/VICODIN) 5-325 MG tablet Take by mouth. 08/27/21   [provider]  ipratropium (ATROVENT) 0.06 % nasal spray  Place into both nostrils. 09/24/21   [provider]  methocarbamol (ROBAXIN) 500 MG tablet Take 500 mg by mouth every 6 (six) hours as needed for muscle spasms.    [provider]  polyethylene glycol (MIRALAX / GLYCOLAX) packet Take 17 g by mouth daily as needed for mild constipation.     [provider]  Probiotic Product (ALIGN PO) Take 1 capsule by mouth daily. Patient not taking: Reported on 10/14/2021    [provider]    Current Outpatient Medications  Medication Sig Dispense Refill   acetaminophen (TYLENOL) 650 MG CR tablet Take 1,300 mg by mouth at bedtime.     citalopram (CELEXA) 10 MG tablet citalopram 10 mg tablet  TAKE 1 TABLET BY MOUTH EVERY DAY FOR 90 DAYS     famotidine (PEPCID) 40 MG tablet Take 40 mg by mouth at bedtime.     fexofenadine-pseudoephedrine (ALLEGRA-D) 60-120 MG 12 hr tablet Take by mouth.     hydrochlorothiazide (HYDRODIURIL) 25 MG tablet Take 1 tablet (25 mg total) by mouth daily.      ondansetron (ZOFRAN-ODT) 4 MG disintegrating tablet Take 4 mg by mouth every 6 (six) hours as needed.     pantoprazole (PROTONIX) 40 MG tablet Take 40 mg by mouth 2 (two) times daily.     potassium chloride (MICRO-K) 10 MEQ CR capsule Take 10 mEq by mouth 2 (two) times daily.      promethazine (PHENERGAN) 25 MG tablet Take 25 mg by mouth every 6 (six) hours as needed for nausea or vomiting.     traZODone (DESYREL) 50 MG tablet Take 37.5 mg by mouth at bedtime.     albuterol (VENTOLIN HFA) 108 (90 Base) MCG/ACT inhaler      clonazePAM (KLONOPIN) 0.5 MG tablet Take 0.5 mg by mouth at bedtime. (Patient not taking: Reported on 10/14/2021)     cyclobenzaprine (FLEXERIL) 10 MG tablet Take 1 tablet (10 mg total) by mouth 3 (three) times daily as needed for muscle spasms. 50 tablet 1   dicyclomine (BENTYL) 20 MG tablet Take 20 mg by mouth 4 (four) times daily as needed for spasms. (Patient not taking: Reported on 10/14/2021)     fluticasone (FLONASE) 50 MCG/ACT nasal spray Place 2 sprays into both nostrils 2 (two) times daily as needed for allergies.     guaiFENesin (MUCINEX) 600 MG 12 hr tablet Take 600 mg by mouth daily.     HYDROcodone bit-homatropine (HYCODAN) 5-1.5 MG/5ML syrup hydrocodone-homatropine 5 mg-1.5 mg/5 mL oral syrup     HYDROcodone-acetaminophen (NORCO/VICODIN) 5-325 MG tablet Take by mouth.     ipratropium (ATROVENT) 0.06 % nasal spray Place into both nostrils.     methocarbamol (ROBAXIN) 500 MG tablet Take 500 mg by mouth every 6 (six) hours as needed for muscle spasms.     polyethylene glycol (MIRALAX / GLYCOLAX) packet Take 17 g by mouth daily as needed for mild constipation.      Probiotic Product (ALIGN PO) Take 1 capsule by mouth daily. (Patient not taking: Reported on 10/14/2021)     Current Facility-Administered Medications  Medication Dose Route Frequency Provider Last Rate Last Admin   0.9 %  sodium chloride infusion  500 mL Intravenous Once Gatha Mayer, MD         Allergies as of 10/14/2021 - Review Complete 10/14/2021  Allergen Reaction Noted   Nsaids  09/25/2021   Hydroxyzine hcl  01/01/2014   Promethazine hcl  03/05/2021   Zolpidem  10/07/2021  Ciprofloxacin Anxiety 02/20/2020   Lactose intolerance (gi) Other (See Comments) 11/09/2016   Penicillins Hives, Rash, and Other (See Comments) 12/09/2011   Septra [sulfamethoxazole-trimethoprim] Rash 01/01/2014    Family History  Adopted: Yes  Problem Relation Age of Onset   Diabetes Mother    Stroke Mother    Alzheimer's disease Mother    Hypertension Sister    Diabetes Sister    Heart attack Brother    Leukemia Brother    CAD Brother    Diabetes Brother    Colon cancer Maternal Aunt    Stroke Paternal Grandmother    Heart attack Paternal Grandfather    Esophageal cancer Neg Hx    Rectal cancer Neg Hx    Stomach cancer Neg Hx     Social History   Socioeconomic History   Marital status: Married    Spouse name: Not on file   Number of children: Not on file   Years of education: Not on file   Highest education level: Not on file  Occupational History   Not on file  Tobacco Use   Smoking status: Never   Smokeless tobacco: Never  Vaping Use   Vaping Use: Never used  Substance and Sexual Activity   Alcohol use: No   Drug use: No   Sexual activity: Not on file  Other Topics Concern   Not on file  Social History Narrative   Lives with husband.    Retired Web designer   Has 2 children.      Review of Systems:  All other review of systems negative except as mentioned in the HPI.  Physical Exam: Vital signs BP (!) 159/72   Pulse 61   Temp (!) 97.4 F (36.3 C) (Temporal)   Resp 19   Ht 5\' 3"  (1.6 m)   Wt 172 lb (78 kg)   SpO2 98%   BMI 30.47 kg/m   General:   Alert,  Well-developed, well-nourished, pleasant and cooperative in NAD Lungs:  Clear throughout to auscultation.   Heart:  Regular rate and rhythm; no murmurs, clicks, rubs,  or  gallops. Abdomen:  Soft, nontender and nondistended. Normal bowel sounds.  Small umbilical hernia Neuro/Psych:  Alert and cooperative. Normal mood and affect. A and O x 3   @Arlee Bossard  Simonne Maffucci, MD, [facility] [phone] (pager) 10/14/2021 8:01 AM@

## 2021-10-14 NOTE — Progress Notes (Signed)
Called to room to assist during endoscopic procedure.  Patient ID and intended procedure confirmed with present staff. Received instructions for my participation in the procedure from the performing physician.  

## 2021-10-14 NOTE — Progress Notes (Signed)
A and O x3. Report to RN. Tolerated MAC anesthesia well.Teeth unchanged after procedure. 

## 2021-10-16 ENCOUNTER — Telehealth: Payer: Self-pay | Admitting: *Deleted

## 2021-10-16 NOTE — Telephone Encounter (Signed)
  Follow up Call-  Call back number 10/14/2021  Post procedure Call Back phone  # [phone]  Permission to leave phone message Yes  Some recent data might be hidden     Patient questions:  Do you have a fever, pain , or abdominal swelling? No. Pain Score  0 *  Have you tolerated food without any problems? Yes.    Have you been able to return to your normal activities? Yes.    Do you have any questions about your discharge instructions: Diet   No. Medications  No. Follow up visit  No.  Do you have questions or concerns about your Care? No.  Actions: * If pain score is 4 or above: No action needed, pain <4.  Have you developed a fever since your procedure? no  2.   Have you had an respiratory symptoms (SOB or cough) since your procedure? no  3.   Have you tested positive for COVID 19 since your procedure no  4.   Have you had any family members/close contacts diagnosed with the COVID 19 since your procedure?  no   If yes to any of these questions please route to Joylene John, RN and Joella Prince, RN

## 2021-10-16 NOTE — Telephone Encounter (Signed)
  Follow up Call-  Call back number 10/14/2021  Post procedure Call Back phone  # [phone]  Permission to leave phone message Yes  Some recent data might be hidden   [facility]

## 2021-10-21 DIAGNOSIS — R053 Chronic cough: Secondary | ICD-10-CM | POA: Diagnosis not present

## 2021-10-21 DIAGNOSIS — J3089 Other allergic rhinitis: Secondary | ICD-10-CM | POA: Diagnosis not present

## 2021-10-21 DIAGNOSIS — J069 Acute upper respiratory infection, unspecified: Secondary | ICD-10-CM | POA: Diagnosis not present

## 2021-10-28 ENCOUNTER — Other Ambulatory Visit: Payer: Self-pay | Admitting: Family Medicine

## 2021-10-28 DIAGNOSIS — Z0000 Encounter for general adult medical examination without abnormal findings: Secondary | ICD-10-CM | POA: Diagnosis not present

## 2021-10-28 DIAGNOSIS — F5101 Primary insomnia: Secondary | ICD-10-CM | POA: Diagnosis not present

## 2021-10-28 DIAGNOSIS — Z1389 Encounter for screening for other disorder: Secondary | ICD-10-CM | POA: Diagnosis not present

## 2021-10-28 DIAGNOSIS — G2581 Restless legs syndrome: Secondary | ICD-10-CM | POA: Diagnosis not present

## 2021-10-28 DIAGNOSIS — I10 Essential (primary) hypertension: Secondary | ICD-10-CM | POA: Diagnosis not present

## 2021-10-28 DIAGNOSIS — Z1231 Encounter for screening mammogram for malignant neoplasm of breast: Secondary | ICD-10-CM

## 2021-10-28 DIAGNOSIS — E7800 Pure hypercholesterolemia, unspecified: Secondary | ICD-10-CM | POA: Diagnosis not present

## 2021-10-30 DIAGNOSIS — E7800 Pure hypercholesterolemia, unspecified: Secondary | ICD-10-CM | POA: Diagnosis not present

## 2021-10-30 DIAGNOSIS — I10 Essential (primary) hypertension: Secondary | ICD-10-CM | POA: Diagnosis not present

## 2021-10-30 DIAGNOSIS — N390 Urinary tract infection, site not specified: Secondary | ICD-10-CM | POA: Diagnosis not present

## 2021-11-14 ENCOUNTER — Telehealth: Payer: Self-pay | Admitting: Internal Medicine

## 2021-11-14 NOTE — Telephone Encounter (Signed)
Pt states that she was recently prescribed this Medication by her old GI Dr.  Kinnie Feil refills to be placed. Pantoprazole 40 mg twice a day Dicyclomine 20 MG 4 times a day as needed.  ( Pt did state that she has abdominal cramping from time to time) Please advise.

## 2021-11-14 NOTE — Telephone Encounter (Signed)
Inbound call from patient stating that she needs a refill for pantoprazole 40 ML and Dicyclomine.  Patient stated that Dr. Carlean Purl did not proscribe those, her old GI did.  Please advise.

## 2021-11-14 NOTE — Telephone Encounter (Signed)
Ok to refill all w/ 90 day supply and for 1 year   You will get an old age warning on dicyclomine that you may override

## 2021-11-17 ENCOUNTER — Other Ambulatory Visit: Payer: Self-pay

## 2021-11-17 DIAGNOSIS — K219 Gastro-esophageal reflux disease without esophagitis: Secondary | ICD-10-CM

## 2021-11-17 MED ORDER — DICYCLOMINE HCL 10 MG PO CAPS
10.0000 mg | ORAL_CAPSULE | Freq: Four times a day (QID) | ORAL | 6 refills | Status: DC | PRN
Start: 2021-11-17 — End: 2022-12-07

## 2021-11-17 MED ORDER — PANTOPRAZOLE SODIUM 40 MG PO TBEC
40.0000 mg | DELAYED_RELEASE_TABLET | Freq: Two times a day (BID) | ORAL | 7 refills | Status: DC
Start: 2021-11-17 — End: 2023-01-13

## 2021-11-17 NOTE — Telephone Encounter (Signed)
Left Message for pt to call back  °

## 2021-11-17 NOTE — Telephone Encounter (Signed)
Prescription sent to pharmacy Pt made aware Pt verbalized understanding with all questions answered.   

## 2021-11-21 DIAGNOSIS — L298 Other pruritus: Secondary | ICD-10-CM | POA: Diagnosis not present

## 2021-11-21 DIAGNOSIS — N3000 Acute cystitis without hematuria: Secondary | ICD-10-CM | POA: Diagnosis not present

## 2021-12-02 ENCOUNTER — Ambulatory Visit
Admission: RE | Admit: 2021-12-02 | Discharge: 2021-12-02 | Disposition: A | Discharge status: Home / Self Care | Payer: Medicare Other | Source: Ambulatory Visit | Attending: Family Medicine | Admitting: Family Medicine

## 2021-12-02 DIAGNOSIS — Z1231 Encounter for screening mammogram for malignant neoplasm of breast: Secondary | ICD-10-CM

## 2021-12-03 ENCOUNTER — Ambulatory Visit: Payer: Medicare Other | Admitting: Podiatry

## 2021-12-12 DIAGNOSIS — K432 Incisional hernia without obstruction or gangrene: Secondary | ICD-10-CM | POA: Diagnosis not present

## 2021-12-12 DIAGNOSIS — N3000 Acute cystitis without hematuria: Secondary | ICD-10-CM | POA: Diagnosis not present

## 2021-12-17 ENCOUNTER — Encounter: Payer: Self-pay | Admitting: Podiatry

## 2021-12-17 ENCOUNTER — Other Ambulatory Visit: Payer: Self-pay

## 2021-12-17 ENCOUNTER — Ambulatory Visit: Payer: Medicare Other | Admitting: Podiatry

## 2021-12-17 DIAGNOSIS — K589 Irritable bowel syndrome without diarrhea: Secondary | ICD-10-CM | POA: Insufficient documentation

## 2021-12-17 DIAGNOSIS — F4320 Adjustment disorder, unspecified: Secondary | ICD-10-CM | POA: Insufficient documentation

## 2021-12-17 DIAGNOSIS — G4721 Circadian rhythm sleep disorder, delayed sleep phase type: Secondary | ICD-10-CM | POA: Insufficient documentation

## 2021-12-17 DIAGNOSIS — Z8673 Personal history of transient ischemic attack (TIA), and cerebral infarction without residual deficits: Secondary | ICD-10-CM | POA: Insufficient documentation

## 2021-12-17 DIAGNOSIS — N939 Abnormal uterine and vaginal bleeding, unspecified: Secondary | ICD-10-CM | POA: Insufficient documentation

## 2021-12-17 DIAGNOSIS — M7752 Other enthesopathy of left foot: Secondary | ICD-10-CM

## 2021-12-17 DIAGNOSIS — K648 Other hemorrhoids: Secondary | ICD-10-CM | POA: Insufficient documentation

## 2021-12-17 DIAGNOSIS — Z860100 Personal history of colon polyps, unspecified: Secondary | ICD-10-CM | POA: Insufficient documentation

## 2021-12-17 DIAGNOSIS — D72820 Lymphocytosis (symptomatic): Secondary | ICD-10-CM | POA: Insufficient documentation

## 2021-12-17 DIAGNOSIS — N3281 Overactive bladder: Secondary | ICD-10-CM | POA: Insufficient documentation

## 2021-12-17 DIAGNOSIS — J309 Allergic rhinitis, unspecified: Secondary | ICD-10-CM | POA: Insufficient documentation

## 2021-12-17 DIAGNOSIS — K5730 Diverticulosis of large intestine without perforation or abscess without bleeding: Secondary | ICD-10-CM | POA: Insufficient documentation

## 2021-12-17 DIAGNOSIS — K5900 Constipation, unspecified: Secondary | ICD-10-CM | POA: Insufficient documentation

## 2021-12-17 DIAGNOSIS — K219 Gastro-esophageal reflux disease without esophagitis: Secondary | ICD-10-CM | POA: Insufficient documentation

## 2021-12-17 DIAGNOSIS — K760 Fatty (change of) liver, not elsewhere classified: Secondary | ICD-10-CM | POA: Insufficient documentation

## 2021-12-17 DIAGNOSIS — R109 Unspecified abdominal pain: Secondary | ICD-10-CM | POA: Insufficient documentation

## 2021-12-17 DIAGNOSIS — G2581 Restless legs syndrome: Secondary | ICD-10-CM | POA: Insufficient documentation

## 2021-12-17 DIAGNOSIS — K8020 Calculus of gallbladder without cholecystitis without obstruction: Secondary | ICD-10-CM | POA: Insufficient documentation

## 2021-12-17 DIAGNOSIS — E871 Hypo-osmolality and hyponatremia: Secondary | ICD-10-CM | POA: Insufficient documentation

## 2021-12-17 DIAGNOSIS — G4700 Insomnia, unspecified: Secondary | ICD-10-CM | POA: Insufficient documentation

## 2021-12-17 DIAGNOSIS — D2372 Other benign neoplasm of skin of left lower limb, including hip: Secondary | ICD-10-CM

## 2021-12-17 DIAGNOSIS — R112 Nausea with vomiting, unspecified: Secondary | ICD-10-CM | POA: Insufficient documentation

## 2021-12-17 DIAGNOSIS — E7800 Pure hypercholesterolemia, unspecified: Secondary | ICD-10-CM | POA: Insufficient documentation

## 2021-12-17 DIAGNOSIS — M5136 Other intervertebral disc degeneration, lumbar region: Secondary | ICD-10-CM | POA: Insufficient documentation

## 2021-12-17 DIAGNOSIS — L719 Rosacea, unspecified: Secondary | ICD-10-CM | POA: Insufficient documentation

## 2021-12-17 NOTE — Progress Notes (Signed)
Surgical Instructions    Your procedure is scheduled on Wednesday, January 25th, 2023.   Report to [facility] Main Entrance "A" at 05:30 A.M., then check in with the Admitting office.  Call this number if you have problems the morning of surgery:  [phone]   If you have any questions prior to your surgery date call [phone]: Open Monday-Friday 8am-4pm    Remember:  Do not eat after midnight the night before your surgery  You may drink clear liquids until 04:30 the morning of your surgery.   Clear liquids allowed are: Water, Non-Citrus Juices (without pulp), Carbonated Beverages, Clear Tea, Black Coffee ONLY (NO MILK, CREAM OR POWDERED CREAMER of any kind), and Gatorade  Patient Instructions  The night before surgery:  No food after midnight. ONLY clear liquids after midnight  The day of surgery (if you do NOT have diabetes):  Drink ONE (1) Pre-Surgery Clear Ensure by 04:30 the morning of surgery. Drink in one sitting. Do not sip.  This drink was given to you during your hospital  pre-op appointment visit.  Nothing else to drink after completing the  Pre-Surgery Clear Ensure.         If you have questions, please contact your surgeons office.     Take these medicines the morning of surgery with A SIP OF WATER:  fexofenadine (ALLEGRA)  pantoprazole (PROTONIX)   If needed:   acetaminophen (TYLENOL)  albuterol (VENTOLIN HFA)  dicyclomine (BENTYL)  HYDROcodone-acetaminophen (NORCO/VICODIN) methocarbamol (ROBAXIN) ondansetron (ZOFRAN-ODT)  promethazine (PHENERGAN) traMADol (ULTRAM)  Please bring all inhalers with you the day of surgery.    As of today, STOP taking any Aspirin (unless otherwise instructed by your surgeon) Aleve, Naproxen, Ibuprofen, Motrin, Advil, Goody's, BC's, all herbal medications, fish oil, and all vitamins.  After your COVID test   You are not required to quarantine however you are required to wear a well-fitting mask when you are out  and around people not in your household.  If your mask becomes wet or soiled, replace with a new one.  Wash your hands often with soap and water for 20 seconds or clean your hands with an alcohol-based hand sanitizer that contains at least 60% alcohol.  Do not share personal items.  Notify your provider: if you are in close contact with someone who has COVID  or if you develop a fever of 100.4 or greater, sneezing, cough, sore throat, shortness of breath or body aches.    The day of surgery:          Do not wear jewelry or makeup Do not wear lotions, powders, perfumes, or deodorant. Do not shave 48 hours prior to surgery.   Do not bring valuables to the hospital. DO Not wear nail polish, gel polish, artificial nails, or any other type of covering on natural nails (fingers and toes) If you have artificial nails or gel coating that need to be removed by a nail salon, please have this removed prior to surgery. Artificial nails or gel coating may interfere with anesthesia's ability to adequately monitor your vital signs.              [facility] is not responsible for any belongings or valuables.  Do NOT Smoke (Tobacco/Vaping)  24 hours prior to your procedure  If you use a CPAP at night, you may bring your mask for your overnight stay.   Contacts, glasses, hearing aids, dentures or partials may not be worn into surgery, please bring cases for these  belongings   For patients admitted to the hospital, discharge time will be determined by your treatment team.   Patients discharged the day of surgery will not be allowed to drive home, and someone needs to stay with them for 24 hours.  NO VISITORS WILL BE ALLOWED IN PRE-OP WHERE PATIENTS ARE PREPPED FOR SURGERY.  ONLY 1 SUPPORT PERSON MAY BE PRESENT IN THE WAITING ROOM WHILE YOU ARE IN SURGERY.  IF YOU ARE TO BE ADMITTED, ONCE YOU ARE IN YOUR ROOM YOU WILL BE ALLOWED TWO (2) VISITORS. 1 (ONE) VISITOR MAY STAY OVERNIGHT BUT MUST ARRIVE TO THE  ROOM BY 8pm.  Minor children may have two parents present. Special consideration for safety and communication needs will be reviewed on a case by case basis.  Special instructions:    Oral Hygiene is also important to reduce your risk of infection.  Remember - BRUSH YOUR TEETH THE MORNING OF SURGERY WITH YOUR REGULAR TOOTHPASTE   [facility]- Preparing For Surgery  Before surgery, you can play an important role. Because skin is not sterile, your skin needs to be as free of germs as possible. You can reduce the number of germs on your skin by washing with CHG (chlorahexidine gluconate) Soap before surgery.  CHG is an antiseptic cleaner which kills germs and bonds with the skin to continue killing germs even after washing.     Please do not use if you have an allergy to CHG or antibacterial soaps. If your skin becomes reddened/irritated stop using the CHG.  Do not shave (including legs and underarms) for at least 48 hours prior to first CHG shower. It is OK to shave your face.  Please follow these instructions carefully.     Shower the NIGHT BEFORE SURGERY and the MORNING OF SURGERY with CHG Soap.   If you chose to wash your hair, wash your hair first as usual with your normal shampoo. After you shampoo, rinse your hair and body thoroughly to remove the shampoo.  Then [employer] and genitals (private parts) with your normal soap and rinse thoroughly to remove soap.  After that Use CHG Soap as you would any other liquid soap. You can apply CHG directly to the skin and wash gently with a scrungie or a clean washcloth.   Apply the CHG Soap to your body ONLY FROM THE NECK DOWN.  Do not use on open wounds or open sores. Avoid contact with your eyes, ears, mouth and genitals (private parts). Wash Face and genitals (private parts)  with your normal soap.   Wash thoroughly, paying special attention to the area where your surgery will be performed.  Thoroughly rinse your body with warm water from the  neck down.  DO NOT shower/wash with your normal soap after using and rinsing off the CHG Soap.  Pat yourself dry with a CLEAN TOWEL.  Wear CLEAN PAJAMAS to bed the night before surgery  Place CLEAN SHEETS on your bed the night before your surgery  DO NOT SLEEP WITH PETS.   Day of Surgery:  Take a shower with CHG soap. Wear Clean/Comfortable clothing the morning of surgery Do not apply any deodorants/lotions.   Remember to brush your teeth WITH YOUR REGULAR TOOTHPASTE.   Please read over the following fact sheets that you were given.

## 2021-12-17 NOTE — Progress Notes (Signed)
She presents today for follow-up of her bursitis lateral aspect of her fifth metatarsal left foot.  Objective: Pulses remain palpable there is no signs of infection benign skin lesions are noted over the plantar lateral aspect of the fifth metatarsal in a linear fashion.  She also has no fluctuance beneath the fifth metatarsal head as she usually does.  Assessment: Resolution of the bursitis of fifth left and then painful benign skin lesions of fifth left.  Plan: Debridement of benign skin lesions bilateral.  Follow-up with her as needed.

## 2021-12-18 ENCOUNTER — Other Ambulatory Visit (HOSPITAL_COMMUNITY): Payer: Medicare Other

## 2021-12-18 ENCOUNTER — Encounter (HOSPITAL_COMMUNITY): Payer: Self-pay

## 2021-12-18 ENCOUNTER — Encounter (HOSPITAL_COMMUNITY)
Admission: RE | Admit: 2021-12-18 | Discharge: 2021-12-18 | Disposition: A | Discharge status: Home / Self Care | Payer: Medicare Other | Source: Ambulatory Visit | Attending: General Surgery | Admitting: General Surgery

## 2021-12-18 VITALS — BP 149/69 | HR 67 | Temp 98.0°F | Resp 17 | Ht 64.0 in | Wt 174.6 lb

## 2021-12-18 DIAGNOSIS — G4733 Obstructive sleep apnea (adult) (pediatric): Secondary | ICD-10-CM | POA: Insufficient documentation

## 2021-12-18 DIAGNOSIS — Z01818 Encounter for other preprocedural examination: Secondary | ICD-10-CM

## 2021-12-18 DIAGNOSIS — Z01812 Encounter for preprocedural laboratory examination: Secondary | ICD-10-CM | POA: Insufficient documentation

## 2021-12-18 DIAGNOSIS — K449 Diaphragmatic hernia without obstruction or gangrene: Secondary | ICD-10-CM | POA: Insufficient documentation

## 2021-12-18 DIAGNOSIS — I2510 Atherosclerotic heart disease of native coronary artery without angina pectoris: Secondary | ICD-10-CM

## 2021-12-18 DIAGNOSIS — I10 Essential (primary) hypertension: Secondary | ICD-10-CM | POA: Diagnosis not present

## 2021-12-18 DIAGNOSIS — D649 Anemia, unspecified: Secondary | ICD-10-CM | POA: Diagnosis not present

## 2021-12-18 DIAGNOSIS — K432 Incisional hernia without obstruction or gangrene: Secondary | ICD-10-CM | POA: Diagnosis not present

## 2021-12-18 DIAGNOSIS — J45909 Unspecified asthma, uncomplicated: Secondary | ICD-10-CM | POA: Insufficient documentation

## 2021-12-18 DIAGNOSIS — K219 Gastro-esophageal reflux disease without esophagitis: Secondary | ICD-10-CM | POA: Diagnosis not present

## 2021-12-18 LAB — BASIC METABOLIC PANEL
Anion gap: 8 (ref 5–15)
BUN: 6 mg/dL — ABNORMAL LOW (ref 8–23)
CO2: 28 mmol/L (ref 22–32)
Calcium: 8.7 mg/dL — ABNORMAL LOW (ref 8.9–10.3)
Chloride: 95 mmol/L — ABNORMAL LOW (ref 98–111)
Creatinine, Ser: 0.76 mg/dL (ref 0.44–1.00)
GFR, Estimated: >60 mL/min (ref >60)
Glucose, Bld: 101 mg/dL — ABNORMAL HIGH (ref 70–99)
Potassium: 3.5 mmol/L (ref 3.5–5.1)
Sodium: 131 mmol/L — ABNORMAL LOW (ref 135–145)

## 2021-12-18 LAB — CBC
HCT: 36.9 % (ref 36.0–46.0)
Hemoglobin: 12.7 g/dL (ref 12.0–15.0)
MCH: 30.7 pg (ref 26.0–34.0)
MCHC: 34.4 g/dL (ref 30.0–36.0)
MCV: 89.1 fL (ref 80.0–100.0)
Platelets: 213 10*3/uL (ref 150–400)
RBC: 4.14 MIL/uL (ref 3.87–5.11)
RDW: 12.4 % (ref 11.5–15.5)
WBC: 5.6 10*3/uL (ref 4.0–10.5)
nRBC: 0 % (ref 0.0–0.2)

## 2021-12-18 NOTE — Progress Notes (Signed)
PCP - Dr. Carol Ada Cardiologist - Dr. Ida Rogue  PPM/ICD - n/a  Chest x-ray - n/a EKG - 12/31/20 Stress Test - 05/26/16 ECHO - 04/21/19 Cardiac Cath - 08/13/16  Sleep Study - OSA+ CPAP - uses nightly  Blood Thinner Instructions: n/a Aspirin Instructions: n/a  ERAS Protcol -Clear liquids until 0430 DOS PRE-SURGERY Ensure or G2- (1) Ensure provided.  COVID TEST- not indicated. Ambulatory procedure.    Anesthesia review: No  Patient denies shortness of breath, fever, cough and chest pain at PAT appointment   All instructions explained to the patient, with a verbal understanding of the material. Patient agrees to go over the instructions while at home for a better understanding. Patient also instructed to self quarantine after being tested for COVID-19. The opportunity to ask questions was provided.

## 2021-12-18 NOTE — Progress Notes (Signed)
Spoke with Elmyra Ricks, triage RN at [facility] regarding pt's recent diagnosis of a UTI. Pt stated that she had a foul urine odor that she reported to her PCP. Urine was tested and came back + for UTI. Pt was started on Cipro by Dr. Joneen Caraway at [facility]. Pt to pick up and start ABX today, 12/18/21. Pt will likely be still taking ABX up until DOS. Elmyra Ricks stated she would make Dr. Donne Hazel aware of this.   Jacqlyn Larsen, RN

## 2021-12-19 NOTE — Progress Notes (Signed)
Anesthesia Chart Review:  Case: [id number] Date/Time: 12/24/21 0715   Procedure: LAP ASSISTED INCISIONAL HERNIA REPAIR WITH MESH - GEN & TAP BLOCK RNFA   Anesthesia type: General   Pre-op diagnosis: INCISIONAL HERNIA, REDUCIBLE   Location: [facility] OR ROOM 01 / [facility] OR   Surgeons: Rolm Bookbinder, MD       DISCUSSION: Patient is a 76 year old female scheduled for the above procedure.  History includes never smoker, post-operative N/V, HTN, GERD, hiatal hernia, anemia, OSA (uses CPAP), seasonal asthma, TIA? (2008 versus sleep deprivation), cholecystectomy (03/25/21), sinus surgery (septoplasty & turbinate reduction 11/18/16), spinal surgery (C6-7 10/25/17).    Last cardiologist visit was on 12/31/20 with Dr. Rockey Situ.  He noted chronic lateral negative T waves on EKG with prior negative stress test in 2017. She has had work-up for chronic dyspnea (most studies done in 2017) including CTA chest showing no PE or ILD, normal PFTs except decreased DLCO, non-ischemic Cardiolite, near normal CPX primarily limited by body habitus, normal RHC (with pulmonary HTN by previous echo), and most recently a 2020 echo showed normal LVEF, impaired LV relaxation, normal RVSF, mild MI/MR. She is using CPAP for OSA. He advised as needed cardiology follow-up.   PAT RN note reviewed. Patient started on Cipro 12/08/21 for UTI which was communicated to [facility] triage nurse.   Anesthesia team to evaluate on the day of surgery.   VS: BP (!) 149/69    Pulse 67    Temp 36.7 C (Oral)    Resp 17    Ht 5\' 4"  (1.626 m)    Wt 79.2 kg    SpO2 97%    BMI 29.97 kg/m    PROVIDERS: Carol Ada, MD is PCP  - Ida Rogue, MD is cardiologist. As needed follow-up recommended at 12/31/20 visit. (She also saw [name], MD ~ [phone] for OSA.) - Philemon Kingdom, MD is endocrinologist. Last visit 03/20/21 for thyroid nodules. TSH 1.17 11/14/20. Plan for biopsy isthmic nodule, and if benign follow-up in one year with repeat US. (FNA  negative 04/01/21.) - Jerrell Belfast, MD is ENT - Silvano Rusk, MD is GI   LABS: Labs reviewed: Acceptable for surgery. (all labs ordered are listed, but only abnormal results are displayed)  Labs Reviewed  BASIC METABOLIC PANEL - Abnormal; Notable for the following components:      Result Value   Sodium 131 (*)    Chloride 95 (*)    Glucose, Bld 101 (*)    BUN 6 (*)    Calcium 8.7 (*)    All other components within normal limits  CBC    Spirometry 04/16/16: FVC 2.85 (89%), FEV1 2.27 (94%), FEV1/FVC 80& (104%), DLCO unc 16.52 (64%).     IMAGES: [place] Abd 01/30/21: IMPRESSION: 1. Probable cholelithiasis now, with clustered subcentimeter echogenic foci with some shadowing. And this includes the previously seen 5-6 mm echogenic focus previously thought to be polyp, now more suggestive of adherent stone. No evidence of acute cholecystitis or bile duct obstruction. 2. Stable fatty liver disease.     EKG: 12/31/20 ([facility]): NSR, ST & T wave abnormality, consider lateral ischemia.      CV: Echo 04/21/19: IMPRESSIONS   1. The left ventricle has normal systolic function with an ejection  fraction of 60-65%. The cavity size was normal. There is mildly increased  left ventricular wall thickness. Left ventricular diastolic Doppler  parameters are consistent with impaired  relaxation.   2. The right ventricle has normal systolic function. The cavity was  normal. There is no increase in right ventricular wall thickness.Unable to  estimate RVSP   3. Aortic valve regurgitation is mild by color flow Doppler.   4. Mild mitral valve regurgitation    EKG: 12/31/20 ([facility]): Normal sinus rhythm.  ST and T wave abnormality, consider lateral ischemia.   CV: Echo 04/21/19: Sonographer Comments: Suboptimal subcostal window.  IMPRESSIONS   1. The left ventricle has normal systolic function with an ejection  fraction of 60-65%. The cavity size was normal. There is mildly  increased  left ventricular wall thickness. Left ventricular diastolic Doppler  parameters are consistent with impaired  relaxation.   2. The right ventricle has normal systolic function. The cavity was  normal. There is no increase in right ventricular wall thickness.Unable to  estimate RVSP   3. Aortic valve regurgitation is mild by color flow Doppler.   4. Mild mitral valve regurgitation    RHC 08/13/16: Normal study.  No evidence for pulmonary hypertension and normal filling pressures.     CPX 05/26/16: Conclusion: Exercise testing with gas-exchange demonstrates  normal functional capacity when compared to matched sedentary  norms. There is no evidence of cardiopulmonary limitation or  exercise-induced bronchospasm. Patient appears primarily limited  due to her body habitus. There was also chronotropic  incompetence.    Nuclear stress test 04/30/16: Nuclear stress EF: 72%. There was no ST segment deviation noted during stress. This is a low risk study. The left ventricular ejection fraction is hyperdynamic (>65%). Breast artifact no ischemia or infarction EF normal 72% no RWMAls    US Carotid 04/12/12: Summary:  No significant extracranial carotid artery stenosis  demonstrated. Vertebrals are patent with antegrade flow.     Past Medical History:  Diagnosis Date   Allergic rhinitis    Allergy    seasonal   Anemia    Anxiety    Cataract    removed bilateraly   Chronic constipation    COVID-19 08/2019   Degenerative disc disease, cervical    Essential hypertension    GERD (gastroesophageal reflux disease)    H/O bronchitis    Hiatal hernia    History of echocardiogram    in epic 04-21-2019,  normal w/ ef 60-65% and mild mitral regurg, no stenosis or restriction   History of TIAs    per pt age 55;  2008;  05/ 2013;  pt stated no residual was from sleep doctor more than likely sleep deprived than actual tia   Motion sickness    cars   Nocturia    OSA on CPAP     Severe ,, CPAP 12 MM H2O- Dr Radford Pax   PONV (postoperative nausea and vomiting)    Restless leg syndrome    Rosacea    Seasonal asthma    no inhaler   Sleep apnea    uses CPAP    Past Surgical History:  Procedure Laterality Date   ANTERIOR CERVICAL DECOMP/DISCECTOMY FUSION N/A 10/25/2017   Procedure: CERVICAL SIX- CERVICAL SEVEN ANTERIOR CERVICAL DECOMPRESSION/DISCECTOMY FUSION, INTERBODY PROSTHESIS AND ANTERIOR PLATING;  Surgeon: Newman Pies, MD;  Location: [facility];  Service: Neurosurgery;  Laterality: N/A;  CERVICAL 6- CERVICAL 7 ANTERIOR CERVICAL DECOMPRESSION/DISCECTOMY FUSION, INTERBODY PROSTHESIS AND ANTERIOR PLATING   BILATERAL BENIGN BREAST BX'S  20 YRS AGO   BREAST BIOPSY Right    benign   BREAST EXCISIONAL BIOPSY Bilateral over 10 years ago   benign   CARDIAC CATHETERIZATION N/A 08/13/2016   Procedure: Right Heart Cath;  Surgeon: Larey Dresser, MD;  Location: [facility] CV LAB;  Service: Cardiovascular;  Laterality: N/A;    normal study, no evidence pulm HTN   CARPAL TUNNEL RELEASE  2009   BILATERAL   CATARACT EXTRACTION W/PHACO Right 02/27/2020   Procedure: CATARACT EXTRACTION PHACO AND INTRAOCULAR LENS PLACEMENT (IOC) RIGHT 3.60 00:23.9;  Surgeon: Birder Robson, MD;  Location: [facility];  Service: Ophthalmology;  Laterality: Right;  sleep apnea   CATARACT EXTRACTION W/PHACO Left 05/21/2020   Procedure: CATARACT EXTRACTION PHACO AND INTRAOCULAR LENS PLACEMENT (IOC) LEFT 3.69 00:30.9;  Surgeon: Birder Robson, MD;  Location: [facility];  Service: Ophthalmology;  Laterality: Left;  sleep apnea   CHOLECYSTECTOMY N/A 03/25/2021   Procedure: LAPAROSCOPIC CHOLECYSTECTOMY;  Surgeon: Rolm Bookbinder, MD;  Location: [facility];  Service: General;  Laterality: N/A;   CYSTOCELE REPAIR  12/11/2011   Procedure: ANTERIOR REPAIR (CYSTOCELE);  Surgeon: Claybon Jabs, MD;  Location: [facility];  Service: Urology;  Laterality: N/A;  1 1/2 hour  requested for case  Anterior repair and mid Urethral Sling   MASS EXCISION N/A 07/24/2019   Procedure: EXCISION OF VAGINAL FOREIGN BODY;  Surgeon: Kathie Rhodes, MD;  Location: [facility];  Service: Urology;  Laterality: N/A;   NASAL SEPTOPLASTY W/ TURBINOPLASTY Bilateral 11/18/2016   Procedure: NASAL SEPTOPLASTY WITH BILATERAL TURBINATE REDUCTION;  Surgeon: Jerrell Belfast, MD;  Location: [facility];  Service: ENT;  Laterality: Bilateral;   NONINVASIVE VASCULAR CAROTID STUDY  09/14/2007   BILATERAL MILD MIX PLAQUE THROUGHOUT, NO SIGNIFICANT BILATERAL ICA STENOSIS   PUBOVAGINAL SLING  12/11/2011   Procedure: Gaynelle Arabian;  Surgeon: Claybon Jabs, MD;  Location: [facility];  Service: Urology;  Laterality: N/A;   PULLEY RELEASE RIGHT THUMB  2009   SHOULDER ARTHROSCOPY DISTAL CLAVICLE EXCISION AND OPEN ROTATOR CUFF REPAIR Right 01-17-2019   dr supple @[facility]    SINUS ENDO WITH FUSION Bilateral 11/18/2016   Procedure: BILATERAL ENDOSCOPIC SINUS SURGERY;  Surgeon: Jerrell Belfast, MD;  Location: [facility];  Service: ENT;  Laterality: Bilateral;   UMBILICAL HERNIA REPAIR N/A 03/25/2021   Procedure: Primary REPAIR UMBILICAL HERNIA;  Surgeon: Rolm Bookbinder, MD;  Location: [facility];  Service: General;  Laterality: N/A;   UPPER GASTROINTESTINAL ENDOSCOPY     VAGINAL HYSTERECTOMY  AGE 60    MEDICATIONS:  acetaminophen (TYLENOL) 500 MG tablet   albuterol (VENTOLIN HFA) 108 (90 Base) MCG/ACT inhaler   citalopram (CELEXA) 20 MG tablet   cyclobenzaprine (FLEXERIL) 10 MG tablet   dicyclomine (BENTYL) 10 MG capsule   famotidine (PEPCID) 40 MG tablet   fexofenadine (ALLEGRA) 180 MG tablet   hydrochlorothiazide (HYDRODIURIL) 25 MG tablet   HYDROcodone-acetaminophen (NORCO/VICODIN) 5-325 MG tablet   methocarbamol (ROBAXIN) 500 MG tablet   ondansetron (ZOFRAN-ODT) 4 MG disintegrating tablet   pantoprazole (PROTONIX) 40 MG tablet   potassium chloride (MICRO-K) 10 MEQ CR  capsule   promethazine (PHENERGAN) 25 MG tablet   traMADol (ULTRAM) 50 MG tablet   traZODone (DESYREL) 50 MG tablet   No current facility-administered medications for this encounter.    Myra Gianotti, PA-C Surgical Short Stay/Anesthesiology [facility] Phone [phone] [facility] Phone [phone] 12/19/2021 5:07 PM

## 2021-12-19 NOTE — Anesthesia Preprocedure Evaluation (Addendum)
Anesthesia Evaluation  Patient identified by MRN, date of birth, ID band Patient awake    Reviewed: Allergy & Precautions, H&P , NPO status , Patient's Chart, lab work & pertinent test results  History of Anesthesia Complications (+) PONV and history of anesthetic complications  Airway Mallampati: II   Neck ROM: full    Dental   Pulmonary asthma , sleep apnea ,    breath sounds clear to auscultation       Cardiovascular hypertension,  Rhythm:regular Rate:Normal     Neuro/Psych PSYCHIATRIC DISORDERS Anxiety    GI/Hepatic hiatal hernia, GERD  ,Umbilical hernia   Endo/Other    Renal/GU      Musculoskeletal  (+) Arthritis ,   Abdominal   Peds  Hematology   Anesthesia Other Findings   Reproductive/Obstetrics                            Anesthesia Physical Anesthesia Plan  ASA: 2  Anesthesia Plan: General   Post-op Pain Management: Regional block   Induction: Intravenous  PONV Risk Score and Plan: 4 or greater and Ondansetron, Dexamethasone and Treatment may vary due to age or medical condition  Airway Management Planned: Oral ETT  Additional Equipment:   Intra-op Plan:   Post-operative Plan: Extubation in OR  Informed Consent: I have reviewed the patients History and Physical, chart, labs and discussed the procedure including the risks, benefits and alternatives for the proposed anesthesia with the patient or authorized representative who has indicated his/her understanding and acceptance.     Dental advisory given  Plan Discussed with: CRNA, Anesthesiologist and Surgeon  Anesthesia Plan Comments: (PAT note written 12/19/2021 by Myra Gianotti, PA-C. )       Anesthesia Quick Evaluation

## 2021-12-24 ENCOUNTER — Ambulatory Visit (HOSPITAL_COMMUNITY)
Admission: RE | Admit: 2021-12-24 | Discharge: 2021-12-24 | Disposition: A | Discharge status: Home / Self Care | Payer: Medicare Other | Attending: General Surgery | Admitting: General Surgery

## 2021-12-24 ENCOUNTER — Ambulatory Visit (HOSPITAL_COMMUNITY): Payer: Medicare Other | Admitting: Vascular Surgery

## 2021-12-24 ENCOUNTER — Other Ambulatory Visit: Payer: Self-pay

## 2021-12-24 ENCOUNTER — Ambulatory Visit (HOSPITAL_COMMUNITY): Payer: Medicare Other | Admitting: Anesthesiology

## 2021-12-24 ENCOUNTER — Encounter (HOSPITAL_COMMUNITY)
Admission: RE | Disposition: A | Discharge status: Home / Self Care | Payer: Self-pay | Source: Home / Self Care | Attending: General Surgery

## 2021-12-24 ENCOUNTER — Encounter (HOSPITAL_COMMUNITY): Payer: Self-pay | Admitting: General Surgery

## 2021-12-24 DIAGNOSIS — K219 Gastro-esophageal reflux disease without esophagitis: Secondary | ICD-10-CM | POA: Diagnosis not present

## 2021-12-24 DIAGNOSIS — G4733 Obstructive sleep apnea (adult) (pediatric): Secondary | ICD-10-CM | POA: Diagnosis not present

## 2021-12-24 DIAGNOSIS — K432 Incisional hernia without obstruction or gangrene: Secondary | ICD-10-CM | POA: Insufficient documentation

## 2021-12-24 DIAGNOSIS — Z79899 Other long term (current) drug therapy: Secondary | ICD-10-CM | POA: Insufficient documentation

## 2021-12-24 DIAGNOSIS — E669 Obesity, unspecified: Secondary | ICD-10-CM | POA: Diagnosis not present

## 2021-12-24 DIAGNOSIS — J4540 Moderate persistent asthma, uncomplicated: Secondary | ICD-10-CM | POA: Diagnosis not present

## 2021-12-24 DIAGNOSIS — G8918 Other acute postprocedural pain: Secondary | ICD-10-CM | POA: Diagnosis not present

## 2021-12-24 DIAGNOSIS — Z6829 Body mass index (BMI) 29.0-29.9, adult: Secondary | ICD-10-CM | POA: Diagnosis not present

## 2021-12-24 DIAGNOSIS — I10 Essential (primary) hypertension: Secondary | ICD-10-CM | POA: Insufficient documentation

## 2021-12-24 HISTORY — PX: INCISIONAL HERNIA REPAIR: SHX193

## 2021-12-24 SURGERY — REPAIR, HERNIA, INCISIONAL, LAPAROSCOPIC
Anesthesia: General

## 2021-12-24 MED ORDER — SUGAMMADEX SODIUM 200 MG/2ML IV SOLN
INTRAVENOUS | Status: DC | PRN
  Administered 2021-12-24: 200 mg via INTRAVENOUS

## 2021-12-24 MED ORDER — CHLORHEXIDINE GLUCONATE CLOTH 2 % EX PADS: 6.0000 | MEDICATED_PAD | Freq: Once | CUTANEOUS | Status: DC

## 2021-12-24 MED ORDER — 0.9 % SODIUM CHLORIDE (POUR BTL) OPTIME
TOPICAL | Status: DC | PRN
  Administered 2021-12-24: 08:00:00 1000 mL

## 2021-12-24 MED ORDER — ROCURONIUM BROMIDE 10 MG/ML (PF) SYRINGE
PREFILLED_SYRINGE | INTRAVENOUS | Status: AC
  Filled 2021-12-24: qty 10

## 2021-12-24 MED ORDER — ORAL CARE MOUTH RINSE: 15.0000 mL | Freq: Once | OROMUCOSAL | Status: AC

## 2021-12-24 MED ORDER — MIDAZOLAM HCL 2 MG/2ML IJ SOLN
INTRAMUSCULAR | Status: AC
  Filled 2021-12-24: qty 2

## 2021-12-24 MED ORDER — OXYCODONE HCL 5 MG PO TABS
5.0000 mg | ORAL_TABLET | Freq: Once | ORAL | Status: AC | PRN
  Administered 2021-12-24: 10:00:00 5 mg via ORAL

## 2021-12-24 MED ORDER — ONDANSETRON HCL 4 MG/2ML IJ SOLN
INTRAMUSCULAR | Status: AC
  Filled 2021-12-24: qty 2

## 2021-12-24 MED ORDER — FENTANYL CITRATE (PF) 250 MCG/5ML IJ SOLN
INTRAMUSCULAR | Status: DC | PRN
Start: 2021-12-24 — End: 2021-12-24
  Administered 2021-12-24 (×2): 25 ug via INTRAVENOUS
  Administered 2021-12-24: 50 ug via INTRAVENOUS

## 2021-12-24 MED ORDER — CHLORHEXIDINE GLUCONATE 0.12 % MT SOLN
15.0000 mL | Freq: Once | OROMUCOSAL | Status: AC
  Administered 2021-12-24: 07:00:00 15 mL via OROMUCOSAL
  Filled 2021-12-24: qty 15

## 2021-12-24 MED ORDER — VANCOMYCIN HCL IN DEXTROSE 1-5 GM/200ML-% IV SOLN
INTRAVENOUS | Status: AC
  Filled 2021-12-24: qty 200

## 2021-12-24 MED ORDER — PROPOFOL 10 MG/ML IV BOLUS
INTRAVENOUS | Status: AC
  Filled 2021-12-24: qty 20

## 2021-12-24 MED ORDER — BUPIVACAINE HCL (PF) 0.25 % IJ SOLN
INTRAMUSCULAR | Status: DC | PRN
  Administered 2021-12-24 (×2): 20 mL via PERINEURAL

## 2021-12-24 MED ORDER — ACETAMINOPHEN 500 MG PO TABS
ORAL_TABLET | ORAL | Status: AC
  Filled 2021-12-24: qty 2

## 2021-12-24 MED ORDER — PHENYLEPHRINE 40 MCG/ML (10ML) SYRINGE FOR IV PUSH (FOR BLOOD PRESSURE SUPPORT)
PREFILLED_SYRINGE | INTRAVENOUS | Status: AC
  Filled 2021-12-24: qty 10

## 2021-12-24 MED ORDER — ARTIFICIAL TEARS OPHTHALMIC OINT
TOPICAL_OINTMENT | OPHTHALMIC | Status: AC
  Filled 2021-12-24: qty 3.5

## 2021-12-24 MED ORDER — VANCOMYCIN HCL IN DEXTROSE 1-5 GM/200ML-% IV SOLN
1000.0000 mg | INTRAVENOUS | Status: AC
  Administered 2021-12-24: 08:00:00 1000 mg via INTRAVENOUS

## 2021-12-24 MED ORDER — FENTANYL CITRATE (PF) 250 MCG/5ML IJ SOLN
INTRAMUSCULAR | Status: AC
  Filled 2021-12-24: qty 5

## 2021-12-24 MED ORDER — ONDANSETRON HCL 4 MG/2ML IJ SOLN: 4.0000 mg | Freq: Four times a day (QID) | INTRAMUSCULAR | Status: DC | PRN

## 2021-12-24 MED ORDER — ACETAMINOPHEN 500 MG PO TABS
1000.0000 mg | ORAL_TABLET | ORAL | Status: AC
  Administered 2021-12-24: 07:00:00 1000 mg via ORAL

## 2021-12-24 MED ORDER — BUPIVACAINE-EPINEPHRINE (PF) 0.25% -1:200000 IJ SOLN
INTRAMUSCULAR | Status: AC
  Filled 2021-12-24: qty 30

## 2021-12-24 MED ORDER — LIDOCAINE 2% (20 MG/ML) 5 ML SYRINGE
INTRAMUSCULAR | Status: DC | PRN
Start: 2021-12-24 — End: 2021-12-24
  Administered 2021-12-24: 40 mg via INTRAVENOUS

## 2021-12-24 MED ORDER — DEXAMETHASONE SODIUM PHOSPHATE 10 MG/ML IJ SOLN
INTRAMUSCULAR | Status: DC | PRN
  Administered 2021-12-24: 5 mg via INTRAVENOUS

## 2021-12-24 MED ORDER — PHENYLEPHRINE 40 MCG/ML (10ML) SYRINGE FOR IV PUSH (FOR BLOOD PRESSURE SUPPORT)
PREFILLED_SYRINGE | INTRAVENOUS | Status: DC | PRN
  Administered 2021-12-24: 40 ug via INTRAVENOUS

## 2021-12-24 MED ORDER — LIDOCAINE 2% (20 MG/ML) 5 ML SYRINGE
INTRAMUSCULAR | Status: AC
  Filled 2021-12-24: qty 5

## 2021-12-24 MED ORDER — DIPHENHYDRAMINE HCL 50 MG/ML IJ SOLN
INTRAMUSCULAR | Status: DC | PRN
  Administered 2021-12-24: 6.25 mg via INTRAVENOUS

## 2021-12-24 MED ORDER — DEXAMETHASONE SODIUM PHOSPHATE 10 MG/ML IJ SOLN
INTRAMUSCULAR | Status: AC
  Filled 2021-12-24: qty 1

## 2021-12-24 MED ORDER — BUPIVACAINE-EPINEPHRINE 0.25% -1:200000 IJ SOLN
INTRAMUSCULAR | Status: DC | PRN
  Administered 2021-12-24: 15 mL

## 2021-12-24 MED ORDER — PROPOFOL 10 MG/ML IV BOLUS
INTRAVENOUS | Status: DC | PRN
  Administered 2021-12-24: 100 mg via INTRAVENOUS

## 2021-12-24 MED ORDER — DIPHENHYDRAMINE HCL 50 MG/ML IJ SOLN
INTRAMUSCULAR | Status: AC
  Filled 2021-12-24: qty 1

## 2021-12-24 MED ORDER — LACTATED RINGERS IV SOLN: INTRAVENOUS | Status: DC

## 2021-12-24 MED ORDER — ENSURE PRE-SURGERY PO LIQD: 296.0000 mL | Freq: Once | ORAL | Status: DC

## 2021-12-24 MED ORDER — MIDAZOLAM HCL 2 MG/2ML IJ SOLN
INTRAMUSCULAR | Status: DC | PRN
Start: 2021-12-24 — End: 2021-12-24
  Administered 2021-12-24: 1 mg via INTRAVENOUS

## 2021-12-24 MED ORDER — FENTANYL CITRATE (PF) 100 MCG/2ML IJ SOLN: 25.0000 ug | INTRAMUSCULAR | Status: DC | PRN

## 2021-12-24 MED ORDER — HYDROCODONE-ACETAMINOPHEN 5-325 MG PO TABS: 1.0000 | ORAL_TABLET | ORAL | 0 refills | Status: DC | PRN

## 2021-12-24 MED ORDER — OXYCODONE HCL 5 MG PO TABS
ORAL_TABLET | ORAL | Status: AC
  Filled 2021-12-24: qty 1

## 2021-12-24 MED ORDER — ROCURONIUM BROMIDE 10 MG/ML (PF) SYRINGE
PREFILLED_SYRINGE | INTRAVENOUS | Status: DC | PRN
Start: 2021-12-24 — End: 2021-12-24
  Administered 2021-12-24: 50 mg via INTRAVENOUS

## 2021-12-24 MED ORDER — OXYCODONE HCL 5 MG/5ML PO SOLN: 5.0000 mg | Freq: Once | ORAL | Status: AC | PRN

## 2021-12-24 MED ORDER — ONDANSETRON HCL 4 MG/2ML IJ SOLN
INTRAMUSCULAR | Status: DC | PRN
  Administered 2021-12-24: 4 mg via INTRAVENOUS

## 2021-12-24 SURGICAL SUPPLY — 61 items
ADH SKN CLS APL DERMABOND .7 (GAUZE/BANDAGES/DRESSINGS) ×1
APL PRP STRL LF DISP 70% ISPRP (MISCELLANEOUS) ×1
APPLIER CLIP LOGIC TI 5 (MISCELLANEOUS) IMPLANT
APPLIER CLIP ROT 10 11.4 M/L (STAPLE)
APR CLP MED LRG 11.4X10 (STAPLE)
APR CLP MED LRG 33X5 (MISCELLANEOUS)
BAG COUNTER SPONGE SURGICOUNT (BAG) ×3 IMPLANT
BAG SPNG CNTER NS LX DISP (BAG) ×1
BNDG GAUZE ELAST 4 BULKY (GAUZE/BANDAGES/DRESSINGS) IMPLANT
CANISTER SUCT 3000ML PPV (MISCELLANEOUS) IMPLANT
CHLORAPREP W/TINT 26 (MISCELLANEOUS) ×3 IMPLANT
CLIP APPLIE ROT 10 11.4 M/L (STAPLE) IMPLANT
COVER SURGICAL LIGHT HANDLE (MISCELLANEOUS) ×3 IMPLANT
DERMABOND ADVANCED (GAUZE/BANDAGES/DRESSINGS) ×1
DERMABOND ADVANCED .7 DNX12 (GAUZE/BANDAGES/DRESSINGS) ×2 IMPLANT
DEVICE RELIATACK FIXATION (MISCELLANEOUS) IMPLANT
DEVICE SECURE STRAP 25 ABSORB (INSTRUMENTS) ×1 IMPLANT
DEVICE TROCAR PUNCTURE CLOSURE (ENDOMECHANICALS) ×3 IMPLANT
DRAPE INCISE IOBAN 66X45 STRL (DRAPES) ×3 IMPLANT
ELECT CAUTERY BLADE 6.4 (BLADE) IMPLANT
ELECT REM PT RETURN 9FT ADLT (ELECTROSURGICAL) ×2
ELECTRODE REM PT RTRN 9FT ADLT (ELECTROSURGICAL) ×2 IMPLANT
GLOVE SURG ENC MOIS LTX SZ7 (GLOVE) ×3 IMPLANT
GLOVE SURG UNDER POLY LF SZ7.5 (GLOVE) ×3 IMPLANT
GOWN STRL REUS W/ TWL LRG LVL3 (GOWN DISPOSABLE) ×6 IMPLANT
GOWN STRL REUS W/TWL LRG LVL3 (GOWN DISPOSABLE) ×6
KIT BASIN OR (CUSTOM PROCEDURE TRAY) ×3 IMPLANT
KIT TURNOVER KIT B (KITS) ×3 IMPLANT
MARKER SKIN DUAL TIP RULER LAB (MISCELLANEOUS) ×3 IMPLANT
MESH VENTRALEX ST 8CM LRG (Mesh General) ×1 IMPLANT
NDL SPNL 22GX3.5 QUINCKE BK (NEEDLE) ×2 IMPLANT
NEEDLE SPNL 22GX3.5 QUINCKE BK (NEEDLE) ×2 IMPLANT
NS IRRIG 1000ML POUR BTL (IV SOLUTION) ×3 IMPLANT
PAD ARMBOARD 7.5X6 YLW CONV (MISCELLANEOUS) ×6 IMPLANT
PENCIL BUTTON HOLSTER BLD 10FT (ELECTRODE) ×1 IMPLANT
RELOAD ENDO RELIATCK 10 HERNIA (MISCELLANEOUS) IMPLANT
RELOAD ENDO RELIATCK 5 HERNIA (MISCELLANEOUS) IMPLANT
RELOAD RELIATACK 10 (MISCELLANEOUS) IMPLANT
RELOAD RELIATACK 5 (MISCELLANEOUS) IMPLANT
SCISSORS LAP 5X35 DISP (ENDOMECHANICALS) IMPLANT
SET IRRIG TUBING LAPAROSCOPIC (IRRIGATION / IRRIGATOR) IMPLANT
SET TUBE SMOKE EVAC HIGH FLOW (TUBING) ×3 IMPLANT
SHEARS HARMONIC ACE PLUS 36CM (ENDOMECHANICALS) IMPLANT
SLEEVE ENDOPATH XCEL 5M (ENDOMECHANICALS) ×3 IMPLANT
STRIP CLOSURE SKIN 1/2X4 (GAUZE/BANDAGES/DRESSINGS) ×3 IMPLANT
STRIP SURGICAL 1/4 X 6 IN (GAUZE/BANDAGES/DRESSINGS) ×1 IMPLANT
SUT MNCRL AB 4-0 PS2 18 (SUTURE) ×4 IMPLANT
SUT NOVA 1 T20/GS 25DT (SUTURE) ×2 IMPLANT
SUT NOVA NAB DX-16 0-1 5-0 T12 (SUTURE) IMPLANT
SUT VIC AB 0 CT1 27 (SUTURE) ×4
SUT VIC AB 0 CT1 27XBRD ANBCTR (SUTURE) ×4 IMPLANT
SUT VIC AB 3-0 SH 27 (SUTURE) ×2
SUT VIC AB 3-0 SH 27X BRD (SUTURE) IMPLANT
TOWEL GREEN STERILE (TOWEL DISPOSABLE) ×3 IMPLANT
TOWEL GREEN STERILE FF (TOWEL DISPOSABLE) ×3 IMPLANT
TRAY FOLEY W/BAG SLVR 14FR (SET/KITS/TRAYS/PACK) IMPLANT
TRAY LAPAROSCOPIC MC (CUSTOM PROCEDURE TRAY) ×3 IMPLANT
TROCAR XCEL BLUNT TIP 100MML (ENDOMECHANICALS) IMPLANT
TROCAR XCEL NON-BLD 11X100MML (ENDOMECHANICALS) ×3 IMPLANT
TROCAR XCEL NON-BLD 5MMX100MML (ENDOMECHANICALS) ×3 IMPLANT
WATER STERILE IRR 1000ML POUR (IV SOLUTION) ×3 IMPLANT

## 2021-12-24 NOTE — H&P (Signed)
76 y.o. female who is seen today for follow up prior to surgery In April of last year I took her to the operating room for a laparoscopic cholecystectomy. She was noted to have a less than 1 cm umbilical hernia at that time. I took her gallbladder out and she is done well from that. I also repaired the umbilical hernia pair with sutures. For the last couple of months this area has had a bulge. This does not really bother her at all but she comes back due to the presence of the bulge. It does go away at times. I scheduled her some time ago and she returns today for preop visit. No real changes, thinks may have gotten bigger.   Medical History: Past Medical History:  Diagnosis Date   Arthritis   GERD (gastroesophageal reflux disease)   Sleep apnea   Patient Active Problem List  Diagnosis   Acquired trigger finger   Allergic rhinoconjunctivitis   Benign essential HTN   Cervical spondylosis with radiculopathy   Radiculopathy, cervical region   Chronic maxillary sinusitis   Chronic rhinitis   Chronic throat clearing   Cough, persistent   Deviated septum   Encounter for orthopedic follow-up care   Hoarseness   Irritable larynx   Nasal turbinate hypertrophy   Obstruction of nasal valve   Lower limb pain, inferior, right   Lumbosacral radiculitis   Moderate persistent asthma   Multiple thyroid nodules   Neck pain   Obesity   Obstructive sleep apnea (adult) (pediatric)   Other cervical disc displacement, unspecified cervical region   Pain in joint of right shoulder   Partial thickness rotator cuff tear   Pulmonary HTN (CMS-HCC)   Dyspnea and respiratory abnormality   SOB (shortness of breath)   Wheeze   Past Surgical History:  Procedure Laterality Date   SPINAL SURGERY 09/2017   SHOULDER SURGERY 12/2018   CHOLECYSTECTOMY   FUNCTIONAL ENDOSCOPIC SINUS SURGERY    Allergies  Allergen Reactions   Nsaids (Non-Steroidal Anti-Inflammatory Drug) Abdominal Pain   Penicillins  Other (See Comments), Rash and Hives  Patient has had a PCN reaction with immediate rash, facial/tongue/throat swelling, SOB, and or lightheadedness with hypotension; Has also developed severe rash involving mucus membranes or skin necrosis.   Has patient had a PCN reaction that required hospitalization No Has patient had a PCN reaction occurring within the last 10 years: No If all of the above answers are "NO", then may proceed with Cephalosporin use. PATIENT HAS HAD A PCN REACTION WITH IMMEDIATE RASH, FACIAL/TONGUE/THROAT SWELLING, SOB, OR LIGHTHEADEDNESS WITH HYPOTENSION:  #  #  #  YES  #  #  #   HAS PT DEVELOPED SEVERE RASH INVOLVING MUCUS MEMBRANES or SKIN NECROSIS: #  #  #  YES  #  #  #   Has patient had a PCN reaction that required hospitalization No Has patient had a PCN reaction occurring within the last 10 years: No If all of the above answers are "NO", then may proceed with Cephalosporin use. Has patient had a PCN reaction causing immediate rash, facial/tongue/throat swelling, SOB or lightheadedness with hypotension: Yes Has patient had a PCN reaction causing severe rash involving mucus membranes or skin necrosis: Yes Has patient had a PCN reaction that required hospitalization No Has patient had a PCN reaction occurring within the last 10 years: No If all of the above answers are "NO", then may proceed with Cephalosporin use. Has patient had a PCN reaction causing immediate rash,  facial/tongue/throat swelling, SOB or lightheadedness with hypotension: Yes Has patient had a PCN reaction causing severe rash involving mucus membranes or skin necrosis: No Has patient had a PCN reaction that required hospitalization No Has patient had a PCN reaction occurring within the last 10 years: No If all of the above answers are "NO", then may proceed with Cephalosporin use.   Cefuroxime Axetil Other (See Comments)  Lack of Therapeutic Effect Lack of Therapeutic Effect   Hydroxyzine Hcl Other  (See Comments)  UNSPECIFIED REACTION  UNSPECIFIED REACTION  unknown UNSPECIFIED REACTION  UNSPECIFIED REACTION  unknown   Promethazine Other (See Comments)  Other reaction(s): hyperactivity Hyperactivity Hyperactivity   Zolpidem Other (See Comments)  groggy groggy   Ciprofloxacin Anxiety   Lactose Other (See Comments)  bloating bloating   Lactulose Other (See Comments)  bloating bloating bloating bloating   Sulfamethoxazole-Trimethoprim Rash   Current Outpatient Medications on File Prior to Visit  Medication Sig Dispense Refill   citalopram (CELEXA) 10 MG tablet citalopram 10 mg tablet TAKE 1 TABLET BY MOUTH EVERY DAY FOR 90 DAYS   fexofenadine-pseudoephedrine (ALLEGRA-D 12H) 60-120 mg ER tablet Take by mouth   hydroCHLOROthiazide (HYDRODIURIL) 25 MG tablet hydrochlorothiazide 25 mg tablet TAKE 1 TABLET BY MOUTH EVERY DAY   pantoprazole (PROTONIX) 40 MG DR tablet pantoprazole 40 mg tablet,delayed release   potassium chloride (MICRO-K) 10 MEQ ER capsule 2 CAPSULES BY MOUTH ONCE A DAY 90 DAYS   traZODone (DESYREL) 50 MG tablet trazodone 50 mg tablet TAKE 1 TO 1&1/2 TABLETS BY MOUTH AT BEDTIME 90 DAYS    History reviewed. No pertinent family history.   Social History   Tobacco Use  Smoking Status Never  Smokeless Tobacco Never    Social History   Socioeconomic History   Marital status: Married  Tobacco Use   Smoking status: Never   Smokeless tobacco: Never  Vaping Use   Vaping Use: Never used  Substance and Sexual Activity   Alcohol use: Not Currently   Drug use: Never   Objective:   Physical Exam   Physical Exam Constitutional:  Appearance: Normal appearance.  Neurological:  Mental Status: She is alert.  abd soft nontender nondistended 1-1.5 cm reducible nontender umbilical incisional hernia    Assessment and Plan:   Incisional hernia, without obstruction or gangrene   Laparoscopic assisted repair of incisional umbilical hernia with  mesh   We discussed that I fixed this primarily before and did not place mesh since I was doing a concomitant laparoscopic cholecystectomy and I thought that was the better decision. I did fix it primarily and there was always a risk of recurrence. We discussed repair with mesh. I discussed putting the laparoscope in her reducing this fixing it primarily as well as with what will likely be a Ventralex patch. This appears to be a small hernia and that should give plenty of coverage for this. We discussed the surgery as well as the risks and recovery. we also discussed as this is a little bigger that may do purely with lsc and mesh- if so will keep overnight

## 2021-12-24 NOTE — Transfer of Care (Signed)
Immediate Anesthesia Transfer of Care Note  Patient: Amy Vang  Procedure(s) Performed: LAP ASSISTED [place] WITH MESH  Patient Location: PACU  Anesthesia Type:General  Level of Consciousness: awake, alert  and oriented  Airway & Oxygen Therapy: Patient Spontanous Breathing and Patient connected to face mask oxygen  Post-op Assessment: Report given to RN and Post -op Vital signs reviewed and stable  Post vital signs: Reviewed and stable  Last Vitals:  Vitals Value Taken Time  BP 163/66 12/24/21 0849  Temp    Pulse 73 12/24/21 0853  Resp 12 12/24/21 0853  SpO2 90 % 12/24/21 0853  Vitals shown include unvalidated device data.  Last Pain:  Vitals:   12/24/21 0649  TempSrc:   PainSc: 0-No pain         Complications: No notable events documented.

## 2021-12-24 NOTE — Op Note (Signed)
Preoperative diagnosis: incisional hernia  Postoperative diagnosis: same as above Procedure: laparoscopic assisted incisional hernia repair with 8 cm ventralex Surgeon: Dr Serita Grammes Anesthesia: general with bilateral TAP blocks EBL: minimal Complications none Drains none Specimens none Sponge and needle count correct times two Dispo recovery stable  Indications: 76 y.o. female who has an incisional hernia. In April of last year I took her to the operating room for a laparoscopic cholecystectomy. She was noted to have a less than 1 cm umbilical hernia at that time. I repaired the umbilical hernia pair with sutures. For the last couple of months this area has had a bulge. We discussed repair of this hernia with mesh.  Procedure: After informed consent obtained she underwent bilateral pectoral blocks.  She was given antibiotics.  SCDs were in place.  She was placed under general anesthesia without complication. She was prepped and draped in standard sterile surgical fashion. Timeout was performed.  I infiltrated Marcaine in the left upper quadrant.  I made a stab incision.  I then inserted a 5 mm direct optical entry trocar without difficulty.  This was done without injury.  I then insufflated the abdomen to 15 mmHg pressure.  I then inserted an additional 5 mm trocar in the left abdomen under direct vision.  She was noted to have about a 1-1/2 cm hernia.  This completely reduced upon insufflation.  I elected at that point to use an 8 cm Ventralex patch.  I entered her anterior incision in a curvilinear fashion that I made before for her gallbladder.  I then dissected out the hernia sac and removed most of that.  I then inserted a 8 cm Ventralex patch and pulled this up.  I used #1 Novafil to close the defect and tacked the mesh down into proper position.  I then look with the laparoscope and the lip was not completely secured to the abdominal wall.  I did use the secure strap to ensure the lip of  the mesh was secured to the abdominal wall.  This was in good position and gave good coverage to the defect.  I then remove the laparoscope and desufflated the abdomen.  The trochars were out.  I then closed the laparoscopic incisions with 4-0 Monocryl and glue.  I then tacked the umbilicus down with a 3-0 Vicryl.  I closed this with 3-0 Vicryl and 4 Monocryl.  Glue was placed.  She tolerated this well was extubated transferred to recovery stable.

## 2021-12-24 NOTE — Anesthesia Procedure Notes (Addendum)
Anesthesia Regional Block: TAP block   Pre-Anesthetic Checklist: , timeout performed,  Correct Patient, Correct Site, Correct Laterality,  Correct Procedure, Correct Position, site marked,  Risks and benefits discussed,  Surgical consent,  Pre-op evaluation,  At surgeon's request and post-op pain management  Laterality: Right  Prep: chloraprep       Needles:  Injection technique: Single-shot  Needle Type: Echogenic Needle     Needle Length: 9cm  Needle Gauge: 21     Additional Needles:   Procedures:,,,, ultrasound used (permanent image in chart),,    Narrative:  Start time: 12/24/2021 7:03 AM End time: 12/24/2021 7:12 AM Injection made incrementally with aspirations every 5 mL.  Performed by: Personally  Anesthesiologist: Albertha Ghee, MD  Additional Notes: Pt tolerated the procedure well.

## 2021-12-24 NOTE — Anesthesia Procedure Notes (Signed)
Procedure Name: Intubation Date/Time: 12/24/2021 7:46 AM Performed by: Alain Marion, CRNA Pre-anesthesia Checklist: Patient identified, Emergency Drugs available, Suction available and Patient being monitored Patient Re-evaluated:Patient Re-evaluated prior to induction Oxygen Delivery Method: Circle System Utilized Preoxygenation: Pre-oxygenation with 100% oxygen Induction Type: IV induction Ventilation: Mask ventilation without difficulty Laryngoscope Size: Miller and 2 Grade View: Grade II Tube type: Oral Tube size: 7.0 mm Number of attempts: 1 Airway Equipment and Method: Stylet and Oral airway Placement Confirmation: ETT inserted through vocal cords under direct vision, positive ETCO2 and breath sounds checked- equal and bilateral Tube secured with: Tape Dental Injury: Teeth and Oropharynx as per pre-operative assessment

## 2021-12-24 NOTE — Anesthesia Procedure Notes (Addendum)
Anesthesia Regional Block: TAP block   Pre-Anesthetic Checklist: , timeout performed,  Correct Patient, Correct Site, Correct Laterality,  Correct Procedure, Correct Position, site marked,  Risks and benefits discussed,  Surgical consent,  Pre-op evaluation,  At surgeon's request and post-op pain management  Laterality: Left  Prep: chloraprep       Needles:  Injection technique: Single-shot  Needle Type: Echogenic Needle     Needle Length: 9cm  Needle Gauge: 21     Additional Needles:   Procedures:,,,, ultrasound used (permanent image in chart),,    Narrative:  Start time: 12/24/2021 7:13 AM End time: 12/24/2021 7:25 AM Injection made incrementally with aspirations every 5 mL.  Performed by: Personally  Anesthesiologist: Albertha Ghee, MD  Additional Notes: Pt tolerated the procedure well.

## 2021-12-24 NOTE — Discharge Instructions (Signed)
[facility] POST OP INSTRUCTIONS  Always review your discharge instruction sheet given to you by the facility where your surgery was performed. IF YOU HAVE DISABILITY OR FAMILY LEAVE FORMS, YOU MUST BRING THEM TO THE OFFICE FOR PROCESSING.   DO NOT GIVE THEM TO YOUR DOCTOR.  A  prescription for pain medication may be given to you upon discharge.  Take your pain medication as prescribed, if needed.  If narcotic pain medicine is not needed, then you may take acetaminophen (Tylenol), naprosyn (Alleve) or ibuprofen (Advil) as needed. Take your usually prescribed medications unless otherwise directed. If you need a refill on your pain medication, please contact your pharmacy.  They will contact our office to request authorization. Prescriptions will not be filled after 5 pm or on week-ends. You should follow a light diet the first 24 hours after arrival home, such as soup and crackers, etc.  Be sure to include lots of fluids daily.  Resume your normal diet the day after surgery. Most patients will experience some swelling and bruising around the umbilicus or in the groin and scrotum.  Ice packs and reclining will help.  Swelling and bruising can take several days to resolve.  It is common to experience some constipation if taking pain medication after surgery.  Increasing fluid intake and taking a stool softener (such as Colace) will usually help or prevent this problem from occurring.  A mild laxative (Milk of Magnesia or Miralax) should be taken according to package directions if there are no bowel movements after 48 hours. Unless discharge instructions indicate otherwise, you may remove your bandages 48 hours after surgery, and you may shower at that time.  You may have steri-strips (small skin tapes) in place directly over the incision.  These strips should be left on the skin for 7-10 days and will come off on their own.  If your surgeon used skin glue on the incision, you may  shower in 24 hours.  The glue will flake off over the next 2-3 weeks.  Any sutures or staples will be removed at the office during your follow-up visit. ACTIVITIES:  You may resume regular (light) daily activities beginning the next day--such as daily self-care, walking, climbing stairs--gradually increasing activities as tolerated.  You may have sexual intercourse when it is comfortable.  Refrain from any heavy lifting or straining until approved by your doctor. You may drive when you are no longer taking prescription pain medication, you can comfortably wear a seatbelt, and you can safely maneuver your car and apply brakes. RETURN TO WORK:  __________________________________________________________ You should see your doctor in the office for a follow-up appointment approximately 2-3 weeks after your surgery.  Make sure that you call for this appointment within a day or two after you arrive home to insure a convenient appointment time. OTHER INSTRUCTIONS:  __________________________________________________________________________________________________________________________________________________________________________________________  WHEN TO CALL YOUR DOCTOR: Fever over 101.0 Inability to urinate Nausea and/or vomiting Extreme swelling or bruising Continued bleeding from incision. Increased pain, redness, or drainage from the incision  The clinic staff is available to answer your questions during regular business hours.  Please don't hesitate to call and ask to speak to one of the nurses for clinical concerns.  If you have a medical emergency, go to the nearest emergency room or call 911.  A surgeon from [facility] is always on call at the hospital   [address], [address], [place], [place]  [address] ?  [address] [address],   [place], [place]   [address] [phone] ? [phone] ? FAX [phone] Web site: [email or web address]  

## 2021-12-25 ENCOUNTER — Encounter (HOSPITAL_COMMUNITY): Payer: Self-pay | Admitting: General Surgery

## 2021-12-25 NOTE — Anesthesia Postprocedure Evaluation (Signed)
Anesthesia Post Note  Patient: Amy Vang  Procedure(s) Performed: LAP ASSISTED [place] WITH MESH     Patient location during evaluation: PACU Anesthesia Type: General Level of consciousness: awake and alert Pain management: pain level controlled Vital Signs Assessment: post-procedure vital signs reviewed and stable Respiratory status: spontaneous breathing, nonlabored ventilation, respiratory function stable and patient connected to nasal cannula oxygen Cardiovascular status: blood pressure returned to baseline and stable Postop Assessment: no apparent nausea or vomiting Anesthetic complications: no   No notable events documented.  Last Vitals:  Vitals:   12/24/21 0934 12/24/21 0942  BP: (!) 151/68 (!) 154/63  Pulse: 68 66  Resp: 12 13  Temp:  36.9 C  SpO2: 96% 96%    Last Pain:  Vitals:   12/24/21 0942  TempSrc:   PainSc: 0-No pain                 Avaleen Brownley S

## 2021-12-29 ENCOUNTER — Encounter (HOSPITAL_COMMUNITY): Payer: Self-pay | Admitting: General Surgery

## 2021-12-30 DIAGNOSIS — G4733 Obstructive sleep apnea (adult) (pediatric): Secondary | ICD-10-CM | POA: Diagnosis not present

## 2022-01-02 DIAGNOSIS — H43813 Vitreous degeneration, bilateral: Secondary | ICD-10-CM | POA: Diagnosis not present

## 2022-01-09 ENCOUNTER — Ambulatory Visit (INDEPENDENT_AMBULATORY_CARE_PROVIDER_SITE_OTHER): Payer: Medicare Other

## 2022-01-09 ENCOUNTER — Telehealth: Payer: Self-pay | Admitting: Cardiovascular Disease

## 2022-01-09 DIAGNOSIS — R002 Palpitations: Secondary | ICD-10-CM

## 2022-01-09 HISTORY — DX: Palpitations: R00.2

## 2022-01-09 NOTE — Telephone Encounter (Signed)
Patient reports having episodes of her heart skipping a beat and going fast at times. She stated that it is not happening right now and it comes at random times. Report she has had TIA's and her mother had open heart surgery for her afib. We then discussed wearing monitor to see what she is feeling. Reviewed that there are two options one of which is Zio and the other Alivecor which can be purchased at the local [employer]. Patient agreeable to wear monitor for two weeks to help identify what she feels. Order entered to be mailed to her address. She requested that I send My Chart message with information on both monitors. Discussed that we will give her a call once results are back and determine if she needs appointment with provider. She verbalized understanding of our conversation, agreeable with plan, and had no further questions at this time.

## 2022-01-09 NOTE — Telephone Encounter (Signed)
Patient c/o Palpitations:  High priority if patient c/o lightheadedness, shortness of breath, or chest pain  How long have you had palpitations/irregular HR/ Afib? Are you having the symptoms now? Started Tuesday 02/07 - had an episode last night and today   Are you currently experiencing lightheadedness, SOB or CP? no  Do you have a history of afib (atrial fibrillation) or irregular heart rhythm? no  Have you checked your BP or HR? (document readings if available): HR last night was 70  Are you experiencing any other symptoms? Fluttering in chest

## 2022-01-13 DIAGNOSIS — R002 Palpitations: Secondary | ICD-10-CM | POA: Diagnosis not present

## 2022-01-16 DIAGNOSIS — K432 Incisional hernia without obstruction or gangrene: Secondary | ICD-10-CM | POA: Diagnosis not present

## 2022-01-16 DIAGNOSIS — Z9889 Other specified postprocedural states: Secondary | ICD-10-CM | POA: Diagnosis not present

## 2022-01-29 ENCOUNTER — Encounter: Payer: Self-pay | Admitting: Internal Medicine

## 2022-01-30 ENCOUNTER — Other Ambulatory Visit: Payer: Self-pay

## 2022-01-30 MED ORDER — PROMETHAZINE HCL 25 MG PO TABS: 25.0000 mg | ORAL_TABLET | Freq: Four times a day (QID) | ORAL | 0 refills | Status: DC | PRN

## 2022-01-30 MED ORDER — ONDANSETRON 4 MG PO TBDP: 4.0000 mg | ORAL_TABLET | Freq: Four times a day (QID) | ORAL | 0 refills | Status: DC | PRN

## 2022-01-30 NOTE — Telephone Encounter (Signed)
Amy Vang up to date on visits, her generic zofran and phenergan refilled as requested on [facility]. ?

## 2022-02-03 DIAGNOSIS — I10 Essential (primary) hypertension: Secondary | ICD-10-CM | POA: Diagnosis not present

## 2022-02-03 DIAGNOSIS — E7800 Pure hypercholesterolemia, unspecified: Secondary | ICD-10-CM | POA: Diagnosis not present

## 2022-02-03 DIAGNOSIS — G4700 Insomnia, unspecified: Secondary | ICD-10-CM | POA: Diagnosis not present

## 2022-02-03 DIAGNOSIS — R002 Palpitations: Secondary | ICD-10-CM | POA: Diagnosis not present

## 2022-02-13 ENCOUNTER — Telehealth: Payer: Self-pay | Admitting: *Deleted

## 2022-02-13 NOTE — Telephone Encounter (Signed)
Left voicemail message to call back so we may review her results.  ?

## 2022-02-13 NOTE — Telephone Encounter (Signed)
Patient is returning your call.  

## 2022-02-13 NOTE — Telephone Encounter (Signed)
Spoke with patient and reviewed results. She verbalized understanding with no further questions at this time.  ?

## 2022-02-13 NOTE — Telephone Encounter (Signed)
-----   Message from Minna Merritts, MD sent at 02/12/2022  6:51 PM EDT ----- ?Normal rhythm ?Rare short episodes tachycardia ?Triggered events associated with PVCs, PVC burden 6.9% ?

## 2022-02-24 DIAGNOSIS — M533 Sacrococcygeal disorders, not elsewhere classified: Secondary | ICD-10-CM | POA: Diagnosis not present

## 2022-02-24 DIAGNOSIS — M5416 Radiculopathy, lumbar region: Secondary | ICD-10-CM | POA: Diagnosis not present

## 2022-03-02 ENCOUNTER — Encounter: Payer: Self-pay | Admitting: Emergency Medicine

## 2022-03-02 ENCOUNTER — Ambulatory Visit
Admission: EM | Admit: 2022-03-02 | Discharge: 2022-03-02 | Disposition: A | Discharge status: Home / Self Care | Payer: Medicare Other | Attending: Emergency Medicine | Admitting: Emergency Medicine

## 2022-03-02 DIAGNOSIS — J0100 Acute maxillary sinusitis, unspecified: Secondary | ICD-10-CM | POA: Diagnosis not present

## 2022-03-02 DIAGNOSIS — J209 Acute bronchitis, unspecified: Secondary | ICD-10-CM | POA: Diagnosis not present

## 2022-03-02 MED ORDER — AZITHROMYCIN 250 MG PO TABS: 250.0000 mg | ORAL_TABLET | Freq: Every day | ORAL | 0 refills | Status: DC

## 2022-03-02 NOTE — Discharge Instructions (Addendum)
Take the Zithromax as directed.  Follow up with your primary care provider if your symptoms are not improving.    

## 2022-03-02 NOTE — ED Provider Notes (Signed)
?[facility] ? ? ? ?CSN: [id number] ?Arrival date & time: 03/02/22  1148 ? ? ?  ? ?History   ?Chief Complaint ?Chief Complaint  ?Patient presents with  ? Cough  ?   ?  ? Headache  ?   ?  ? ? ?HPI ?Amy Vang is a 76 y.o. female.  Patient presents with >1 week history of nonproductive cough, sinus congestion, post nasal drip, runny nose, headache, hoarse voice.  She denies fever, rash, wheezing, shortness of breath, vomiting, diarrhea, or other symptoms.  Several OTC treatments attempted without relief.  Her medical history includes chronic sinusitis, hypertension, pulmonary hypertension, TIA, asthma, GERD, IBS., ? ?The history is provided by the patient and medical records.  ? ?Past Medical History:  ?Diagnosis Date  ? Allergic rhinitis   ? Allergy   ? seasonal  ? Anemia   ? Anxiety   ? Cataract   ? removed bilateraly  ? Chronic constipation   ? COVID-19 08/2019  ? Degenerative disc disease, cervical   ? Essential hypertension   ? GERD (gastroesophageal reflux disease)   ? H/O bronchitis   ? Hiatal hernia   ? History of echocardiogram   ? in epic 04-21-2019,  normal w/ ef 60-65% and mild mitral regurg, no stenosis or restriction  ? History of TIAs   ? per pt age 78;  2008;  05/ 2013;  pt stated no residual was from sleep doctor more than likely sleep deprived than actual tia  ? Motion sickness   ? cars  ? Nocturia   ? OSA on CPAP   ? Severe ,, CPAP 12 MM H2O- Dr Radford Pax  ? PONV (postoperative nausea and vomiting)   ? Restless leg syndrome   ? Rosacea   ? Seasonal asthma   ? no inhaler  ? Sleep apnea   ? uses CPAP  ? ? ?Patient Active Problem List  ? Diagnosis Date Noted  ? Abnormal vaginal bleeding 12/17/2021  ? Allergic rhinitis 12/17/2021  ? Adjustment disorder 12/17/2021  ? Cholelithiasis without obstruction 12/17/2021  ? Constipation 12/17/2021  ? Degeneration of lumbar intervertebral disc 12/17/2021  ? Diverticulosis of sigmoid colon 12/17/2021  ? Fatty liver 12/17/2021  ? Functional abdominal pain  syndrome 12/17/2021  ? Gastro-esophageal reflux disease without esophagitis 12/17/2021  ? Hypo-osmolality and hyponatremia 12/17/2021  ? Internal hemorrhoids 12/17/2021  ? Irritable bowel syndrome without diarrhea 12/17/2021  ? Lymphocytosis 12/17/2021  ? Nausea and vomiting 12/17/2021  ? Overactive bladder 12/17/2021  ? Personal history of colonic polyps 12/17/2021  ? Personal history of transient ischemic attack (TIA), and cerebral infarction without residual deficits 12/17/2021  ? Insomnia 12/17/2021  ? Pure hypercholesterolemia 12/17/2021  ? Restless legs syndrome 12/17/2021  ? Rosacea 12/17/2021  ? Sleep-wake schedule disorder, 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  ? Piriformis syndrome of right side 01/01/2021  ? Lower limb pain, inferior, right 09/19/2020  ? Lumbosacral radiculitis 09/19/2020  ? Body mass index (BMI) 25.0-25.9, adult 06/17/2020  ? 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  ? Annular tear of lumbar disc 01/24/2020  ? Acute back pain with sciatica 12/28/2019  ? Encounter for orthopedic follow-up care 01/17/2019  ? Partial thickness rotator cuff tear 11/07/2018  ? Acquired trigger finger 10/28/2018  ? Pain in joint of right shoulder 05/06/2018  ? Hoarseness 12/22/2017  ? Cervical spondylosis with radiculopathy 10/25/2017  ? Neck pain 08/31/2017  ? Other cervical disc displacement, unspecified cervical region 08/31/2017  ? Sinusitis, chronic 11/18/2016  ? Deviated septum 09/18/2016  ?  Dyspnea and respiratory abnormality 05/21/2016  ? Wheeze 05/21/2016  ? Chronic throat clearing 05/21/2016  ? Pulmonary HTN ([place]) 04/23/2016  ? Moderate persistent asthma 11/12/2015  ? Allergic rhinoconjunctivitis 11/12/2015  ? 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  ? SOB (shortness of breath) 03/06/2015  ? OSA (obstructive sleep apnea) 01/01/2014  ? Benign essential HTN 01/01/2014  ? Obesity 01/01/2014  ? ? ?Past Surgical History:  ?Procedure Laterality Date  ? ANTERIOR CERVICAL DECOMP/DISCECTOMY FUSION N/A 10/25/2017  ? Procedure:  CERVICAL SIX- CERVICAL SEVEN ANTERIOR CERVICAL DECOMPRESSION/DISCECTOMY FUSION, INTERBODY PROSTHESIS AND ANTERIOR PLATING;  Surgeon: Newman Pies, MD;  Location: [place];  Service: Neurosurgery;  Laterality: N/A;  CERVICAL 6- CERVICAL 7 ANTERIOR CERVICAL DECOMPRESSION/DISCECTOMY FUSION, INTERBODY PROSTHESIS AND ANTERIOR PLATING  ? BILATERAL BENIGN BREAST BX'S  20 YRS AGO  ? BREAST BIOPSY Right   ? benign  ? BREAST EXCISIONAL BIOPSY Bilateral over 10 years ago  ? benign  ? CARDIAC CATHETERIZATION N/A 08/13/2016  ? Procedure: Right Heart Cath;  Surgeon: Larey Dresser, MD;  Location: [place] [facility];  Service: Cardiovascular;  Laterality: N/A;    normal study, no evidence pulm HTN  ? CARPAL TUNNEL RELEASE  2009  ? BILATERAL  ? CATARACT EXTRACTION W/PHACO Right 02/27/2020  ? Procedure: CATARACT EXTRACTION PHACO AND INTRAOCULAR LENS PLACEMENT (IOC) RIGHT 3.60 00:23.9;  Surgeon: Birder Robson, MD;  Location: [place];  Service: Ophthalmology;  Laterality: Right;  sleep apnea  ? CATARACT EXTRACTION W/PHACO Left 05/21/2020  ? Procedure: CATARACT EXTRACTION PHACO AND INTRAOCULAR LENS PLACEMENT (IOC) LEFT 3.69 00:30.9;  Surgeon: Birder Robson, MD;  Location: [place];  Service: Ophthalmology;  Laterality: Left;  sleep apnea  ? CHOLECYSTECTOMY N/A 03/25/2021  ? Procedure: LAPAROSCOPIC CHOLECYSTECTOMY;  Surgeon: Rolm Bookbinder, MD;  Location: [place];  Service: General;  Laterality: N/A;  ? CYSTOCELE REPAIR  12/11/2011  ? Procedure: ANTERIOR REPAIR (CYSTOCELE);  Surgeon: Claybon Jabs, MD;  Location: [facility];  Service: Urology;  Laterality: N/A;  1 1/2 hour requested for case ? ?Anterior repair and mid Urethral Sling  ? INCISIONAL HERNIA REPAIR N/A 12/24/2021  ? Procedure: LAP ASSISTED INCISIONAL HERNIA REPAIR WITH MESH;  Surgeon: Rolm Bookbinder, MD;  Location: [place];  Service: General;  Laterality: N/A;  GEN & TAP BLOCK ?RNFA  ? MASS EXCISION N/A 07/24/2019  ?  Procedure: EXCISION OF VAGINAL FOREIGN BODY;  Surgeon: Kathie Rhodes, MD;  Location: [facility];  Service: Urology;  Laterality: N/A;  ? NASAL SEPTOPLASTY W/ TURBINOPLASTY Bilateral 11/18/2016  ? Procedure: NASAL SEPTOPLASTY WITH BILATERAL TURBINATE REDUCTION;  Surgeon: Jerrell Belfast, MD;  Location: [place];  Service: ENT;  Laterality: Bilateral;  ? [place] CAROTID STUDY  09/14/2007  ? BILATERAL MILD MIX PLAQUE THROUGHOUT, NO SIGNIFICANT BILATERAL ICA STENOSIS  ? PUBOVAGINAL SLING  12/11/2011  ? Procedure: Gaynelle Arabian;  Surgeon: Claybon Jabs, MD;  Location: [facility];  Service: Urology;  Laterality: N/A;  ? PULLEY RELEASE RIGHT THUMB  2009  ? SHOULDER ARTHROSCOPY DISTAL CLAVICLE EXCISION AND OPEN ROTATOR CUFF REPAIR Right 01-17-2019   dr supple '@SCG'$   ? SINUS ENDO WITH FUSION Bilateral 11/18/2016  ? Procedure: BILATERAL ENDOSCOPIC SINUS SURGERY;  Surgeon: Jerrell Belfast, MD;  Location: [place];  Service: ENT;  Laterality: Bilateral;  ? UMBILICAL HERNIA REPAIR N/A 03/25/2021  ? Procedure: Primary REPAIR UMBILICAL HERNIA;  Surgeon: Rolm Bookbinder, MD;  Location: [place];  Service: General;  Laterality: N/A;  ? UPPER GASTROINTESTINAL ENDOSCOPY    ?  VAGINAL HYSTERECTOMY  AGE 77  ? ? ?OB History   ?No obstetric history on file. ?  ? ? ? ?Home Medications   ? ?Prior to Admission medications   ?Medication Sig Start Date End Date Taking? Authorizing Provider  ?azithromycin (ZITHROMAX) 250 MG tablet Take 1 tablet (250 mg total) by mouth daily. Take first 2 tablets together, then 1 every day until finished. 03/02/22  Yes Sharion Balloon, NP  ?acetaminophen (TYLENOL) 500 MG tablet Take 500-1,000 mg by mouth every 6 (six) hours as needed for mild pain or headache.    [provider]  ?albuterol (VENTOLIN HFA) 108 (90 Base) MCG/ACT inhaler Inhale 2 puffs into the lungs every 4 (four) hours as needed for shortness of breath or wheezing. 08/14/21   [provider]   ?ciprofloxacin (CIPRO) 500 MG tablet Take 500 mg by mouth 2 (two) times daily.    [provider]  ?citalopram (CELEXA) 20 MG tablet Take 10 mg by mouth at bedtime.    [provider]  ?cycl

## 2022-03-02 NOTE — ED Triage Notes (Signed)
Patient in office today complaining of cough ,hoarseness and Ha x8d. ?Has used otc meds which hasn't helped ? ?OTC: tylenol, delsym and robitussin ?Denies: fever ?

## 2022-03-16 DIAGNOSIS — M5441 Lumbago with sciatica, right side: Secondary | ICD-10-CM | POA: Diagnosis not present

## 2022-03-17 DIAGNOSIS — M5441 Lumbago with sciatica, right side: Secondary | ICD-10-CM | POA: Diagnosis not present

## 2022-03-17 DIAGNOSIS — S32030A Wedge compression fracture of third lumbar vertebra, initial encounter for closed fracture: Secondary | ICD-10-CM | POA: Diagnosis not present

## 2022-03-18 ENCOUNTER — Other Ambulatory Visit: Payer: Self-pay | Admitting: Internal Medicine

## 2022-03-30 DIAGNOSIS — G4733 Obstructive sleep apnea (adult) (pediatric): Secondary | ICD-10-CM | POA: Diagnosis not present

## 2022-04-21 DIAGNOSIS — S32030D Wedge compression fracture of third lumbar vertebra, subsequent encounter for fracture with routine healing: Secondary | ICD-10-CM | POA: Diagnosis not present

## 2022-04-30 DIAGNOSIS — M8088XS Other osteoporosis with current pathological fracture, vertebra(e), sequela: Secondary | ICD-10-CM | POA: Diagnosis not present

## 2022-04-30 DIAGNOSIS — M4856XA Collapsed vertebra, not elsewhere classified, lumbar region, initial encounter for fracture: Secondary | ICD-10-CM | POA: Diagnosis not present

## 2022-04-30 DIAGNOSIS — M8008XA Age-related osteoporosis with current pathological fracture, vertebra(e), initial encounter for fracture: Secondary | ICD-10-CM | POA: Diagnosis not present

## 2022-04-30 DIAGNOSIS — M8088XG Other osteoporosis with current pathological fracture, vertebra(e), subsequent encounter for fracture with delayed healing: Secondary | ICD-10-CM | POA: Diagnosis not present

## 2022-04-30 HISTORY — PX: BACK SURGERY: SHX140

## 2022-05-05 DIAGNOSIS — K219 Gastro-esophageal reflux disease without esophagitis: Secondary | ICD-10-CM | POA: Diagnosis not present

## 2022-05-05 DIAGNOSIS — I10 Essential (primary) hypertension: Secondary | ICD-10-CM | POA: Diagnosis not present

## 2022-05-05 DIAGNOSIS — E7800 Pure hypercholesterolemia, unspecified: Secondary | ICD-10-CM | POA: Diagnosis not present

## 2022-05-05 DIAGNOSIS — G4700 Insomnia, unspecified: Secondary | ICD-10-CM | POA: Diagnosis not present

## 2022-05-15 DIAGNOSIS — S32030S Wedge compression fracture of third lumbar vertebra, sequela: Secondary | ICD-10-CM | POA: Diagnosis not present

## 2022-05-30 ENCOUNTER — Ambulatory Visit
Admission: EM | Admit: 2022-05-30 | Discharge: 2022-05-30 | Disposition: A | Discharge status: Home / Self Care | Payer: Medicare Other | Attending: Emergency Medicine | Admitting: Emergency Medicine

## 2022-05-30 DIAGNOSIS — J069 Acute upper respiratory infection, unspecified: Secondary | ICD-10-CM

## 2022-05-30 DIAGNOSIS — R051 Acute cough: Secondary | ICD-10-CM

## 2022-05-30 MED ORDER — ALBUTEROL SULFATE HFA 108 (90 BASE) MCG/ACT IN AERS
1.0000 | INHALATION_SPRAY | Freq: Four times a day (QID) | RESPIRATORY_TRACT | 0 refills | Status: DC | PRN
Start: 2022-05-30 — End: 2023-02-26

## 2022-05-30 MED ORDER — BENZONATATE 100 MG PO CAPS: 100.0000 mg | ORAL_CAPSULE | Freq: Three times a day (TID) | ORAL | 0 refills | Status: DC | PRN

## 2022-05-30 MED ORDER — PREDNISONE 10 MG (21) PO TBPK: ORAL_TABLET | Freq: Every day | ORAL | 0 refills | Status: DC

## 2022-05-30 NOTE — Discharge Instructions (Addendum)
Use the albuterol inhaler and take the prednisone as directed.    Take the [facility] as needed for cough.    Follow up with your primary care provider if your symptoms are not improving.

## 2022-05-30 NOTE — ED Provider Notes (Signed)
Amy Vang    CSN: [id number] Arrival date & time: 05/30/22  1025      History   Chief Complaint Chief Complaint  Patient presents with   Cough    HPI Amy Vang is a 76 y.o. female.  Patient presents with 2 to 3-day history of cough, sinus drainage, congestion, hoarseness.  Treatment with Robitussin and Delsym.  No fever, chills, shortness of breath, vomiting, diarrhea, or other symptoms.  Her medical history includes chronic sinusitis, chronic throat clearing, allergic rhinitis, asthma, hypertension, pulmonary hypertension, diverticulitis, GERD, IBS, history of TIAs.  The history is provided by the patient and medical records.    Past Medical History:  Diagnosis Date   Allergic rhinitis    Allergy    seasonal   Anemia    Anxiety    Cataract    removed bilateraly   Chronic constipation    COVID-19 08/2019   Degenerative disc disease, cervical    Essential hypertension    GERD (gastroesophageal reflux disease)    H/O bronchitis    Hiatal hernia    History of echocardiogram    in epic 04-21-2019,  normal w/ ef 60-65% and mild mitral regurg, no stenosis or restriction   History of TIAs    per pt age 67;  2008;  05/ 2013;  pt stated no residual was from sleep doctor more than likely sleep deprived than actual tia   Motion sickness    cars   Nocturia    OSA on CPAP    Severe ,, CPAP 12 MM H2O- Dr Radford Pax   PONV (postoperative nausea and vomiting)    Restless leg syndrome    Rosacea    Seasonal asthma    no inhaler   Sleep apnea    uses CPAP    Patient Active Problem List   Diagnosis Date Noted   Abnormal vaginal bleeding 12/17/2021   Allergic rhinitis 12/17/2021   Adjustment disorder 12/17/2021   Cholelithiasis without obstruction 12/17/2021   Constipation 12/17/2021   Degeneration of lumbar intervertebral disc 12/17/2021   Diverticulosis of sigmoid colon 12/17/2021   Fatty liver 12/17/2021   Functional abdominal pain syndrome 12/17/2021    Gastro-esophageal reflux disease without esophagitis 12/17/2021   Hypo-osmolality and hyponatremia 12/17/2021   Internal hemorrhoids 12/17/2021   Irritable bowel syndrome without diarrhea 12/17/2021   Lymphocytosis 12/17/2021   Nausea and vomiting 12/17/2021   Overactive bladder 12/17/2021   Personal history of colonic polyps 12/17/2021   Personal history of transient ischemic attack (TIA), and cerebral infarction without residual deficits 12/17/2021   Insomnia 12/17/2021   Pure hypercholesterolemia 12/17/2021   Restless legs syndrome 12/17/2021   Rosacea 12/17/2021   Sleep-wake schedule disorder, 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   Piriformis syndrome of right side 01/01/2021   Lower limb pain, inferior, right 09/19/2020   Lumbosacral radiculitis 09/19/2020   Body mass index (BMI) 25.0-25.9, adult 06/17/2020   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   Annular tear of lumbar disc 01/24/2020   Acute back pain with sciatica 12/28/2019   Encounter for orthopedic follow-up care 01/17/2019   Partial thickness rotator cuff tear 11/07/2018   Acquired trigger finger 10/28/2018   Pain in joint of right shoulder 05/06/2018   Hoarseness 12/22/2017   Cervical spondylosis with radiculopathy 10/25/2017   Neck pain 08/31/2017   Other cervical disc displacement, unspecified cervical region 08/31/2017   Sinusitis, chronic 11/18/2016   Deviated septum 09/18/2016   Dyspnea and respiratory abnormality 05/21/2016   Wheeze 05/21/2016  Chronic throat clearing 05/21/2016   Pulmonary HTN ([place]) 04/23/2016   Moderate persistent asthma 11/12/2015   Allergic rhinoconjunctivitis 11/12/2015   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   SOB (shortness of breath) 03/06/2015   OSA (obstructive sleep apnea) 01/01/2014   Benign essential HTN 01/01/2014   Obesity 01/01/2014    Past Surgical History:  Procedure Laterality Date   ANTERIOR CERVICAL DECOMP/DISCECTOMY FUSION N/A 10/25/2017   Procedure: CERVICAL SIX- CERVICAL  SEVEN ANTERIOR CERVICAL DECOMPRESSION/DISCECTOMY FUSION, INTERBODY PROSTHESIS AND ANTERIOR PLATING;  Surgeon: Newman Pies, MD;  Location: [place];  Service: Neurosurgery;  Laterality: N/A;  CERVICAL 6- CERVICAL 7 ANTERIOR CERVICAL DECOMPRESSION/DISCECTOMY FUSION, INTERBODY PROSTHESIS AND ANTERIOR PLATING   BACK SURGERY     BILATERAL BENIGN BREAST BX'S  20 YRS AGO   BREAST BIOPSY Right    benign   BREAST EXCISIONAL BIOPSY Bilateral over 10 years ago   benign   CARDIAC CATHETERIZATION N/A 08/13/2016   Procedure: Right Heart Cath;  Surgeon: Larey Dresser, MD;  Location: [place] [facility];  Service: Cardiovascular;  Laterality: N/A;    normal study, no evidence pulm HTN   CARPAL TUNNEL RELEASE  2009   BILATERAL   CATARACT EXTRACTION W/PHACO Right 02/27/2020   Procedure: CATARACT EXTRACTION PHACO AND INTRAOCULAR LENS PLACEMENT (IOC) RIGHT 3.60 00:23.9;  Surgeon: Birder Robson, MD;  Location: [place];  Service: Ophthalmology;  Laterality: Right;  sleep apnea   CATARACT EXTRACTION W/PHACO Left 05/21/2020   Procedure: CATARACT EXTRACTION PHACO AND INTRAOCULAR LENS PLACEMENT (IOC) LEFT 3.69 00:30.9;  Surgeon: Birder Robson, MD;  Location: [place];  Service: Ophthalmology;  Laterality: Left;  sleep apnea   CHOLECYSTECTOMY N/A 03/25/2021   Procedure: LAPAROSCOPIC CHOLECYSTECTOMY;  Surgeon: Rolm Bookbinder, MD;  Location: [place];  Service: General;  Laterality: N/A;   CYSTOCELE REPAIR  12/11/2011   Procedure: ANTERIOR REPAIR (CYSTOCELE);  Surgeon: Claybon Jabs, MD;  Location[facility];  Service: Urology;  Laterality: N/A;  1 1/2 hour requested for case  Anterior repair and mid Urethral Sling   INCISIONAL HERNIA REPAIR N/A 12/24/2021   Procedure: LAP ASSISTED INCISIONAL HERNIA REPAIR WITH MESH;  Surgeon: Rolm Bookbinder, MD;  Location: [place];  Service: General;  Laterality: N/A;  GEN & TAP BLOCK RNFA   MASS EXCISION N/A 07/24/2019    Procedure: EXCISION OF VAGINAL FOREIGN BODY;  Surgeon: Kathie Rhodes, MD;  Location: [facility];  Service: Urology;  Laterality: N/A;   NASAL SEPTOPLASTY W/ TURBINOPLASTY Bilateral 11/18/2016   Procedure: NASAL SEPTOPLASTY WITH BILATERAL TURBINATE REDUCTION;  Surgeon: Jerrell Belfast, MD;  Location: [place];  Service: ENT;  Laterality: Bilateral;   NONINVASIVE VASCULAR CAROTID STUDY  09/14/2007   BILATERAL MILD MIX PLAQUE THROUGHOUT, NO SIGNIFICANT BILATERAL ICA STENOSIS   PUBOVAGINAL SLING  12/11/2011   Procedure: Gaynelle Arabian;  Surgeon: Claybon Jabs, MD;  Location: [facility];  Service: Urology;  Laterality: N/A;   PULLEY RELEASE RIGHT THUMB  2009   SHOULDER ARTHROSCOPY DISTAL CLAVICLE EXCISION AND OPEN ROTATOR CUFF REPAIR Right 01-17-2019   dr supple '@SCG'$    SINUS ENDO WITH FUSION Bilateral 11/18/2016   Procedure: BILATERAL ENDOSCOPIC SINUS SURGERY;  Surgeon: Jerrell Belfast, MD;  Location: [place];  Service: ENT;  Laterality: Bilateral;   UMBILICAL HERNIA REPAIR N/A 03/25/2021   Procedure: Primary REPAIR UMBILICAL HERNIA;  Surgeon: Rolm Bookbinder, MD;  Location: [place];  Service: General;  Laterality: N/A;   UPPER GASTROINTESTINAL ENDOSCOPY     VAGINAL HYSTERECTOMY  AGE  28    OB History   No obstetric history on file.      Home Medications    Prior to Admission medications   Medication Sig Start Date End Date Taking? Authorizing Provider  albuterol (VENTOLIN HFA) 108 (90 Base) MCG/ACT inhaler Inhale 1-2 puffs into the lungs every 6 (six) hours as needed for wheezing or shortness of breath. 05/30/22  Yes Sharion Balloon, NP  benzonatate (TESSALON) 100 MG capsule Take 1 capsule (100 mg total) by mouth 3 (three) times daily as needed for cough. 05/30/22  Yes Sharion Balloon, NP  predniSONE (STERAPRED UNI-PAK 21 TAB) 10 MG (21) TBPK tablet Take by mouth daily. As directed 05/30/22  Yes Sharion Balloon, NP  acetaminophen (TYLENOL) 500 MG tablet Take 500-1,000 mg  by mouth every 6 (six) hours as needed for mild pain or headache.    [provider]  azithromycin (ZITHROMAX) 250 MG tablet Take 1 tablet (250 mg total) by mouth daily. Take first 2 tablets together, then 1 every day until finished. 03/02/22   Sharion Balloon, NP  ciprofloxacin (CIPRO) 500 MG tablet Take 500 mg by mouth 2 (two) times daily.    [provider]  citalopram (CELEXA) 20 MG tablet Take 10 mg by mouth at bedtime.    [provider]  cyclobenzaprine (FLEXERIL) 10 MG tablet Take 1 tablet (10 mg total) by mouth 3 (three) times daily as needed for muscle spasms. Patient taking differently: Take 10 mg by mouth at bedtime as needed for muscle spasms. 10/25/17   Newman Pies, MD  dicyclomine (BENTYL) 10 MG capsule Take 1 capsule (10 mg total) by mouth 4 (four) times daily as needed for spasms. 11/17/21   Gatha Mayer, MD  famotidine (PEPCID) 40 MG tablet Take 40 mg by mouth at bedtime.    [provider]  fexofenadine (ALLEGRA) 180 MG tablet Take 180 mg by mouth daily.    [provider]  hydrochlorothiazide (HYDRODIURIL) 25 MG tablet Take 1 tablet (25 mg total) by mouth daily. 05/30/19   Minna Merritts, MD  HYDROcodone-acetaminophen (NORCO/VICODIN) 5-325 MG tablet Take 1 tablet by mouth every 6 (six) hours as needed for severe pain. 08/27/21   [provider]  HYDROcodone-acetaminophen (NORCO/VICODIN) 5-325 MG tablet Take 1 tablet by mouth every 4 (four) hours as needed for moderate pain or severe pain. 12/24/21   Rolm Bookbinder, MD  methocarbamol (ROBAXIN) 500 MG tablet Take 500 mg by mouth every 6 (six) hours as needed for muscle spasms.    [provider]  ondansetron (ZOFRAN-ODT) 4 MG disintegrating tablet TAKE 1 TABLET BY MOUTH EVERY 6 HOURS AS NEEDED 03/18/22   Gatha Mayer, MD  pantoprazole (PROTONIX) 40 MG tablet Take 1 tablet (40 mg total) by mouth 2 (two) times daily. 11/17/21   Gatha Mayer, MD  potassium  chloride (MICRO-K) 10 MEQ CR capsule Take 20 mEq by mouth daily. 03/23/15   [provider]  promethazine (PHENERGAN) 25 MG tablet Take 1 tablet (25 mg total) by mouth every 6 (six) hours as needed for nausea or vomiting. 01/30/22   Gatha Mayer, MD  traMADol (ULTRAM) 50 MG tablet Take 50 mg by mouth every 6 (six) hours as needed for moderate pain.    [provider]  traZODone (DESYREL) 50 MG tablet Take 25 mg by mouth at bedtime.    [provider]    Family History Family History  Adopted: Yes  Problem Relation Age  of Onset   Diabetes Mother    Stroke Mother    Alzheimer's disease Mother    Hypertension Sister    Diabetes Sister    Heart attack Brother    Leukemia Brother    CAD Brother    Diabetes Brother    Colon cancer Maternal Aunt    Stroke Paternal Grandmother    Heart attack Paternal Grandfather    Esophageal cancer Neg Hx    Rectal cancer Neg Hx    Stomach cancer Neg Hx     Social History Social History   Tobacco Use   Smoking status: Never   Smokeless tobacco: Never  Vaping Use   Vaping Use: Never used  Substance Use Topics   Alcohol use: No   Drug use: No     Allergies   Nsaids, Other, Hydroxyzine hcl, Promethazine hcl, Sulfamethoxazole-trimethoprim, Zolpidem, Zolpidem tartrate, Lactose intolerance (gi), Penicillins, and Septra [sulfamethoxazole-trimethoprim]   Review of Systems Review of Systems  Constitutional:  Negative for chills and fever.  HENT:  Positive for congestion, postnasal drip, rhinorrhea and voice change. Negative for ear pain and sore throat.   Respiratory:  Positive for cough. Negative for shortness of breath.   Cardiovascular:  Negative for chest pain and palpitations.  Gastrointestinal:  Negative for diarrhea and vomiting.  Skin:  Negative for color change and rash.  All other systems reviewed and are negative.    Physical Exam Triage Vital Signs ED Triage Vitals  Enc Vitals Group     BP 05/30/22  1041 132/81     Pulse Rate 05/30/22 1041 82     Resp 05/30/22 1041 18     Temp 05/30/22 1041 97.8 F (36.6 C)     Temp Source 05/30/22 1041 Temporal     SpO2 05/30/22 1041 96 %     Weight --      Height --      Head Circumference --      Peak Flow --      Pain Score 05/30/22 1039 6     Pain Loc --      Pain Edu? --      Excl. in [place]? --    No data found.  Updated Vital Signs BP 132/81 (BP Location: Left Arm)   Pulse 82   Temp 97.8 F (36.6 C) (Temporal)   Resp 18   SpO2 96%   Visual Acuity Right Eye Distance:   Left Eye Distance:   Bilateral Distance:    Right Eye Near:   Left Eye Near:    Bilateral Near:     Physical Exam Vitals and nursing note reviewed.  Constitutional:      General: She is not in acute distress.    Appearance: She is well-developed. She is not ill-appearing.  HENT:     Right Ear: Tympanic membrane normal.     Left Ear: Tympanic membrane normal.     Nose: Nose normal.     Mouth/Throat:     Mouth: Mucous membranes are moist.     Pharynx: Oropharynx is clear.     Comments: Clear PND Cardiovascular:     Rate and Rhythm: Normal rate and regular rhythm.     Heart sounds: Normal heart sounds.  Pulmonary:     Effort: Pulmonary effort is normal. No respiratory distress.     Breath sounds: Normal breath sounds.  Musculoskeletal:     Cervical back: Neck supple.  Skin:    General: Skin is warm and dry.  Neurological:     Mental Status: She is alert.  Psychiatric:        Mood and Affect: Mood normal.        Behavior: Behavior normal.      UC Treatments / Results  Labs (all labs ordered are listed, but only abnormal results are displayed) Labs Reviewed - No data to display  EKG   Radiology No results found.  Procedures Procedures (including critical care time)  Medications Ordered in UC Medications - No data to display  Initial Impression / Assessment and Plan / UC Course  I have reviewed the triage vital signs and the nursing  notes.  Pertinent labs & imaging results that were available during my care of the patient were reviewed by me and considered in my medical decision making (see chart for details).    Viral URI, cough.  No respiratory distress, lungs are clear, O2 sat 96% on room air.  Treating with albuterol inhaler, prednisone, Tessalon Perles.  Instructed patient to follow-up with her PCP if her symptoms are not improving.  Education provided on upper respiratory infection and cough.  She agrees to plan of care.  Final Clinical Impressions(s) / UC Diagnoses   Final diagnoses:  Acute cough  Viral URI     Discharge Instructions      Use the albuterol inhaler and take the prednisone as directed.    Take the [facility] as needed for cough.    Follow up with your primary care provider if your symptoms are not improving.        ED Prescriptions     Medication Sig Dispense Auth. Provider   albuterol (VENTOLIN HFA) 108 (90 Base) MCG/ACT inhaler Inhale 1-2 puffs into the lungs every 6 (six) hours as needed for wheezing or shortness of breath. 18 g Sharion Balloon, NP   predniSONE (STERAPRED UNI-PAK 21 TAB) 10 MG (21) TBPK tablet Take by mouth daily. As directed 21 tablet Sharion Balloon, NP   benzonatate (TESSALON) 100 MG capsule Take 1 capsule (100 mg total) by mouth 3 (three) times daily as needed for cough. 21 capsule Sharion Balloon, NP      PDMP not reviewed this encounter.   Sharion Balloon, NP 05/30/22 1058

## 2022-05-30 NOTE — ED Triage Notes (Signed)
Patient presents to Urgent Care with complaints of productive cough x 3 days. Treating symptoms with robitussin, mucinex, and delsym.   Denies fever or SOB.

## 2022-06-04 DIAGNOSIS — R413 Other amnesia: Secondary | ICD-10-CM | POA: Diagnosis not present

## 2022-06-04 DIAGNOSIS — F5101 Primary insomnia: Secondary | ICD-10-CM | POA: Diagnosis not present

## 2022-06-04 DIAGNOSIS — M8448XA Pathological fracture, other site, initial encounter for fracture: Secondary | ICD-10-CM | POA: Diagnosis not present

## 2022-06-04 DIAGNOSIS — I10 Essential (primary) hypertension: Secondary | ICD-10-CM | POA: Diagnosis not present

## 2022-06-04 DIAGNOSIS — E7800 Pure hypercholesterolemia, unspecified: Secondary | ICD-10-CM | POA: Diagnosis not present

## 2022-06-04 DIAGNOSIS — R053 Chronic cough: Secondary | ICD-10-CM | POA: Diagnosis not present

## 2022-06-08 ENCOUNTER — Other Ambulatory Visit: Payer: Self-pay | Admitting: Family Medicine

## 2022-06-08 DIAGNOSIS — M8448XA Pathological fracture, other site, initial encounter for fracture: Secondary | ICD-10-CM

## 2022-06-22 ENCOUNTER — Other Ambulatory Visit: Payer: Self-pay | Admitting: Internal Medicine

## 2022-06-23 MED ORDER — PROMETHAZINE HCL 25 MG PO TABS: 25.0000 mg | ORAL_TABLET | Freq: Four times a day (QID) | ORAL | 0 refills | Status: DC | PRN

## 2022-06-23 MED ORDER — ONDANSETRON 4 MG PO TBDP: 4.0000 mg | ORAL_TABLET | Freq: Four times a day (QID) | ORAL | 0 refills | Status: DC | PRN

## 2022-06-29 DIAGNOSIS — G4733 Obstructive sleep apnea (adult) (pediatric): Secondary | ICD-10-CM | POA: Diagnosis not present

## 2022-07-06 ENCOUNTER — Ambulatory Visit (INDEPENDENT_AMBULATORY_CARE_PROVIDER_SITE_OTHER): Payer: Medicare Other | Admitting: Neurology

## 2022-07-06 ENCOUNTER — Encounter: Payer: Self-pay | Admitting: Neurology

## 2022-07-06 VITALS — BP 150/71 | HR 74 | Ht 64.5 in | Wt 174.0 lb

## 2022-07-06 DIAGNOSIS — R419 Unspecified symptoms and signs involving cognitive functions and awareness: Secondary | ICD-10-CM | POA: Diagnosis not present

## 2022-07-06 NOTE — Progress Notes (Signed)
[facility]  PATIENT: Amy Vang DOB: 06-26-1946  REQUESTING CLINICIAN: Carol Ada, MD HISTORY FROM: Patient  REASON FOR VISIT: Memory problems    HISTORICAL  CHIEF COMPLAINT:  Chief Complaint  Patient presents with   New Patient (Initial Visit)    RM 15 alone- Here for consult on worsening memory. Over the last 6 months sx have been present. She did have surgery to correct a fractured vertebrae (first of June).     HISTORY OF PRESENT ILLNESS:  This is a 76 year old woman past medical history of sleep apnea on CPAP, GERD, who is presenting with memory problem for the past year.  Patient reports  the memory problem as being forgetful, she will walk into one room and forget the reason why she was there.  She has issues with remembering names.  She went to the beach in the month of June and family was very concerned about her memory because she was not acting right.  During this time patient was dealing with chronic back pain, was on multiple medications including hydrocodone, Flexeril, Robaxin, trazodone.  Since then, she did have low back surgery, and stated that her pain has markedly improved and she is not taking all these medications and also still her memory has improved.  She does report a history of Alzheimer's in her mother and she was diagnosed in her late 45s.  She denies being lost in familiar places, denies leaving the stove on when cooking and she currently pays her own bills, denies being late.  She is independent in all ADLs and IADLs.   TBI:   No past history of TBI Stroke:  No past history of stroke but 2 TIAs Seizures:   no past history of seizures Sleep:   Yes, on CPAP  Mood: Yes   Functional status: independent in all ADLs and IADLs Patient lives with husband  Cooking: patient  Cleaning: patient  Shopping: patient Bathing: Patient  Toileting: Patient  Driving: Still driving, no recent accident  Bills: Yes, denies being late   Medications: yes, patient Ever left the stove on by accident?: No Forget how to use items around the house?: No Getting lost going to familiar places?: No Forgetting loved ones names?: No  Word finding difficulty? No Sleep: Good woth Trazodone    OTHER MEDICAL CONDITIONS: GERD, Low back pain s/p surgery, Sleep apnea on CPAP    REVIEW OF SYSTEMS: Full 14 system review of systems performed and negative with exception of:  As noted in the HPI   ALLERGIES: Allergies  Allergen Reactions   Nsaids      Abdominal Pain   Other Other (See Comments)   Hydroxyzine Hcl     UNSPECIFIED REACTION    Promethazine Hcl     hyperactivity   Sulfamethoxazole-Trimethoprim Other (See Comments)   Zolpidem     groggy   Zolpidem Tartrate Other (See Comments)   Lactose Intolerance (Gi) Other (See Comments)    bloating   Penicillins Hives, Rash and Other (See Comments)    Patient has had a PCN reaction with immediate rash, facial/tongue/throat swelling, SOB, and or lightheadedness with hypotension; Has also developed severe rash involving mucus membranes or skin necrosis.   Has patient had a PCN reaction that required hospitalization No Has patient had a PCN reaction occurring within the last 10 years: No If all of the above answers are "NO", then may proceed with Cephalosporin use.    Septra [Sulfamethoxazole-Trimethoprim] Rash    HOME MEDICATIONS:  Outpatient Medications Prior to Visit  Medication Sig Dispense Refill   acetaminophen (TYLENOL) 500 MG tablet Take 500-1,000 mg by mouth every 6 (six) hours as needed for mild pain or headache.     albuterol (VENTOLIN HFA) 108 (90 Base) MCG/ACT inhaler Inhale 1-2 puffs into the lungs every 6 (six) hours as needed for wheezing or shortness of breath. 18 g 0   citalopram (CELEXA) 20 MG tablet Take 10 mg by mouth at bedtime.     cyclobenzaprine (FLEXERIL) 10 MG tablet Take 1 tablet (10 mg total) by mouth 3 (three) times daily as needed for muscle spasms.  (Patient taking differently: Take 10 mg by mouth at bedtime as needed for muscle spasms.) 50 tablet 1   dicyclomine (BENTYL) 10 MG capsule Take 1 capsule (10 mg total) by mouth 4 (four) times daily as needed for spasms. 90 capsule 6   famotidine (PEPCID) 40 MG tablet Take 40 mg by mouth at bedtime.     fexofenadine (ALLEGRA) 180 MG tablet Take 180 mg by mouth daily.     hydrochlorothiazide (HYDRODIURIL) 25 MG tablet Take 1 tablet (25 mg total) by mouth daily.     HYDROcodone-acetaminophen (NORCO/VICODIN) 5-325 MG tablet Take 1 tablet by mouth every 6 (six) hours as needed for severe pain.     HYDROcodone-acetaminophen (NORCO/VICODIN) 5-325 MG tablet Take 1 tablet by mouth every 4 (four) hours as needed for moderate pain or severe pain. 15 tablet 0   methocarbamol (ROBAXIN) 500 MG tablet Take 500 mg by mouth every 6 (six) hours as needed for muscle spasms.     ondansetron (ZOFRAN-ODT) 4 MG disintegrating tablet Take 1 tablet (4 mg total) by mouth every 6 (six) hours as needed. 18 tablet 0   pantoprazole (PROTONIX) 40 MG tablet Take 1 tablet (40 mg total) by mouth 2 (two) times daily. 90 tablet 7   potassium chloride (MICRO-K) 10 MEQ CR capsule Take 20 mEq by mouth daily.     promethazine (PHENERGAN) 25 MG tablet Take 1 tablet (25 mg total) by mouth every 6 (six) hours as needed for nausea or vomiting. 30 tablet 0   traMADol (ULTRAM) 50 MG tablet Take 50 mg by mouth every 6 (six) hours as needed for moderate pain.     traZODone (DESYREL) 50 MG tablet Take 25 mg by mouth at bedtime.     azithromycin (ZITHROMAX) 250 MG tablet Take 1 tablet (250 mg total) by mouth daily. Take first 2 tablets together, then 1 every day until finished. 6 tablet 0   benzonatate (TESSALON) 100 MG capsule Take 1 capsule (100 mg total) by mouth 3 (three) times daily as needed for cough. 21 capsule 0   ciprofloxacin (CIPRO) 500 MG tablet Take 500 mg by mouth 2 (two) times daily.     predniSONE (STERAPRED UNI-PAK 21 TAB) 10 MG  (21) TBPK tablet Take by mouth daily. As directed 21 tablet 0   No facility-administered medications prior to visit.    PAST MEDICAL HISTORY: Past Medical History:  Diagnosis Date   Allergic rhinitis    Allergy    seasonal   Anemia    Anxiety    Cataract    removed bilateraly   Chronic constipation    COVID-19 08/2019   Degenerative disc disease, cervical    Essential hypertension    GERD (gastroesophageal reflux disease)    H/O bronchitis    Hiatal hernia    History of echocardiogram    in epic 04-21-2019,  normal w/  ef 60-65% and mild mitral regurg, no stenosis or restriction   History of TIAs    per pt age 28;  2008;  05/ 2013;  pt stated no residual was from sleep doctor more than likely sleep deprived than actual tia   Motion sickness    cars   Nocturia    OSA on CPAP    Severe ,, CPAP 12 MM H2O- Dr Radford Pax   PONV (postoperative nausea and vomiting)    Restless leg syndrome    Rosacea    Seasonal asthma    no inhaler   Sleep apnea    uses CPAP    PAST SURGICAL HISTORY: Past Surgical History:  Procedure Laterality Date   ANTERIOR CERVICAL DECOMP/DISCECTOMY FUSION N/A 10/25/2017   Procedure: CERVICAL SIX- CERVICAL SEVEN ANTERIOR CERVICAL DECOMPRESSION/DISCECTOMY FUSION, INTERBODY PROSTHESIS AND ANTERIOR PLATING;  Surgeon: Newman Pies, MD;  Location: [place];  Service: Neurosurgery;  Laterality: N/A;  CERVICAL 6- CERVICAL 7 ANTERIOR CERVICAL DECOMPRESSION/DISCECTOMY FUSION, INTERBODY PROSTHESIS AND ANTERIOR PLATING   BACK SURGERY     BILATERAL BENIGN BREAST BX'S  20 YRS AGO   BREAST BIOPSY Right    benign   BREAST EXCISIONAL BIOPSY Bilateral over 10 years ago   benign   CARDIAC CATHETERIZATION N/A 08/13/2016   Procedure: Right Heart Cath;  Surgeon: Larey Dresser, MD;  Location: [facility];  Service: Cardiovascular;  Laterality: N/A;    normal study, no evidence pulm HTN   CARPAL TUNNEL RELEASE  2009   BILATERAL   CATARACT EXTRACTION W/PHACO Right  02/27/2020   Procedure: CATARACT EXTRACTION PHACO AND INTRAOCULAR LENS PLACEMENT (IOC) RIGHT 3.60 00:23.9;  Surgeon: Birder Robson, MD;  Location: [place];  Service: Ophthalmology;  Laterality: Right;  sleep apnea   CATARACT EXTRACTION W/PHACO Left 05/21/2020   Procedure: CATARACT EXTRACTION PHACO AND INTRAOCULAR LENS PLACEMENT (IOC) LEFT 3.69 00:30.9;  Surgeon: Birder Robson, MD;  Location: [place];  Service: Ophthalmology;  Laterality: Left;  sleep apnea   CHOLECYSTECTOMY N/A 03/25/2021   Procedure: LAPAROSCOPIC CHOLECYSTECTOMY;  Surgeon: Rolm Bookbinder, MD;  Location: [place];  Service: General;  Laterality: N/A;   CYSTOCELE REPAIR  12/11/2011   Procedure: ANTERIOR REPAIR (CYSTOCELE);  Surgeon: Claybon Jabs, MD;  Location: [facility];  Service: Urology;  Laterality: N/A;  1 1/2 hour requested for case  Anterior repair and mid Urethral Sling   INCISIONAL HERNIA REPAIR N/A 12/24/2021   Procedure: LAP ASSISTED INCISIONAL HERNIA REPAIR WITH MESH;  Surgeon: Rolm Bookbinder, MD;  Location: [place];  Service: General;  Laterality: N/A;  GEN & TAP BLOCK RNFA   MASS EXCISION N/A 07/24/2019   Procedure: EXCISION OF VAGINAL FOREIGN BODY;  Surgeon: Kathie Rhodes, MD;  Location: [facility];  Service: Urology;  Laterality: N/A;   NASAL SEPTOPLASTY W/ TURBINOPLASTY Bilateral 11/18/2016   Procedure: NASAL SEPTOPLASTY WITH BILATERAL TURBINATE REDUCTION;  Surgeon: Jerrell Belfast, MD;  Location: [place];  Service: ENT;  Laterality: Bilateral;   NONINVASIVE VASCULAR CAROTID STUDY  09/14/2007   BILATERAL MILD MIX PLAQUE THROUGHOUT, NO SIGNIFICANT BILATERAL ICA STENOSIS   PUBOVAGINAL SLING  12/11/2011   Procedure: Gaynelle Arabian;  Surgeon: Claybon Jabs, MD;  Location[facility];  Service: Urology;  Laterality: N/A;   PULLEY RELEASE RIGHT THUMB  2009   SHOULDER ARTHROSCOPY DISTAL CLAVICLE EXCISION AND OPEN ROTATOR CUFF  REPAIR Right 01-17-2019   dr supple '@SCG'$    SINUS ENDO WITH FUSION Bilateral 11/18/2016   Procedure:  BILATERAL ENDOSCOPIC SINUS SURGERY;  Surgeon: Jerrell Belfast, MD;  Location: [facility];  Service: ENT;  Laterality: Bilateral;   UMBILICAL HERNIA REPAIR N/A 03/25/2021   Procedure: Primary REPAIR UMBILICAL HERNIA;  Surgeon: Rolm Bookbinder, MD;  Location: [place];  Service: General;  Laterality: N/A;   UPPER GASTROINTESTINAL ENDOSCOPY     VAGINAL HYSTERECTOMY  AGE 89    FAMILY HISTORY: Family History  Adopted: Yes  Problem Relation Age of Onset   Diabetes Mother    Stroke Mother    Alzheimer's disease Mother    Hypertension Sister    Diabetes Sister    Heart attack Brother    Leukemia Brother    CAD Brother    Diabetes Brother    Colon cancer Maternal Aunt    Stroke Paternal Grandmother    Heart attack Paternal Grandfather    Esophageal cancer Neg Hx    Rectal cancer Neg Hx    Stomach cancer Neg Hx     SOCIAL HISTORY: Social History   Socioeconomic History   Marital status: Married    Spouse name: Not on file   Number of children: Not on file   Years of education: Not on file   Highest education level: Not on file  Occupational History   Not on file  Tobacco Use   Smoking status: Never   Smokeless tobacco: Never  Vaping Use   Vaping Use: Never used  Substance and Sexual Activity   Alcohol use: No   Drug use: No   Sexual activity: Not on file  Other Topics Concern   Not on file  Social History Narrative   Lives with husband.    Retired Web designer   Has 2 children.   Social Determinants of Health   Financial Resource Strain: Not on file  Food Insecurity: Not on file  Transportation Needs: Not on file  Physical Activity: Not on file  Stress: Not on file  Social Connections: Not on file  Intimate Partner Violence: Not on file   PHYSICAL EXAM  GENERAL EXAM/CONSTITUTIONAL: Vitals:  Vitals:   07/06/22 1305  BP: (!) 150/71  Pulse: 74   Weight: 174 lb (78.9 kg)  Height: 5' 4.5" (1.638 m)   Body mass index is 29.41 kg/m. Wt Readings from Last 3 Encounters:  07/06/22 174 lb (78.9 kg)  12/18/21 174 lb 9.6 oz (79.2 kg)  10/14/21 172 lb (78 kg)   Patient is in no distress; well developed, nourished and groomed; neck is supple  EYES: Pupils round and reactive to light, Visual fields full to confrontation, Extraocular movements intacts,   MUSCULOSKELETAL: Gait, strength, tone, movements noted in Neurologic exam below  NEUROLOGIC: MENTAL STATUS:      No data to display            07/06/2022    1:06 PM  Montreal Cognitive Assessment   Visuospatial/ Executive (0/5) 3  Naming (0/3) 3  Attention: Read list of digits (0/2) 2  Attention: Read list of letters (0/1) 1  Attention: Serial 7 subtraction starting at 100 (0/3) 3  Language: Repeat phrase (0/2) 1  Language : Fluency (0/1) 1  Abstraction (0/2) 2  Delayed Recall (0/5) 5  Orientation (0/6) 6  Total 27  Adjusted Score (based on education) 27     CRANIAL NERVE:  2nd, 3rd, 4th, 6th - pupils equal and reactive to light, visual fields full to confrontation, extraocular muscles intact, no nystagmus 5th - facial sensation symmetric 7th - facial strength symmetric 8th -  hearing intact 9th - palate elevates symmetrically, uvula midline 11th - shoulder shrug symmetric 12th - tongue protrusion midline  MOTOR:  normal bulk and tone, full strength in the BUE, BLE  SENSORY:  normal and symmetric to light touch, vibration  COORDINATION:  finger-nose-finger, fine finger movements normal  REFLEXES:  deep tendon reflexes present and symmetric  GAIT/STATION:  normal  DIAGNOSTIC DATA (LABS, IMAGING, TESTING) - I reviewed patient records, labs, notes, testing and imaging myself where available.  Lab Results  Component Value Date   WBC 5.6 12/18/2021   HGB 12.7 12/18/2021   HCT 36.9 12/18/2021   MCV 89.1 12/18/2021   PLT 213 12/18/2021       Component Value Date/Time   NA 131 (L) 12/18/2021 1100   K 3.5 12/18/2021 1100   CL 95 (L) 12/18/2021 1100   CO2 28 12/18/2021 1100   GLUCOSE 101 (H) 12/18/2021 1100   BUN 6 (L) 12/18/2021 1100   CREATININE 0.76 12/18/2021 1100   CREATININE 0.66 04/22/2016 1436   CALCIUM 8.7 (L) 12/18/2021 1100   PROT 6.4 08/11/2021 1638   ALBUMIN 4.3 08/11/2021 1638   AST 15 08/11/2021 1638   ALT 14 08/11/2021 1638   ALKPHOS 45 08/11/2021 1638   BILITOT 0.6 08/11/2021 1638   GFRNONAA >60 12/18/2021 1100   GFRAA >60 10/18/2017 1426   Lab Results  Component Value Date   CHOL 142 04/12/2012   HDL 29 (L) 04/12/2012   LDLCALC 75 04/12/2012   TRIG 192 (H) 04/12/2012   CHOLHDL 4.9 04/12/2012   Lab Results  Component Value Date   HGBA1C 6.0 (H) 04/12/2012   No results found for: "VITAMINB12" Lab Results  Component Value Date   TSH 1.46 04/22/2016     ASSESSMENT AND PLAN  76 y.o. year old female with history of sleep apnea on CPAP, GERD, who is presenting with concern of memory deficits that she described as being forgetful, going to the room and forget the reason why she came to the room in the first place.  Patient reports her symptoms have improved since she stopped taking all pain medications including hydrocodone, Flexeril, methocarbamol, trazodone.  On exam today her MoCA score was normal.  She is independent in all ADLs and IADLs, she still drives, denies being lost in familiar places and no recent accident.  I informed patient that her exam was normal.  I will still check the TSH and vitamin B12 level.  Advised her to continue her current medications and to follow-up with PCP and return in a year or sooner if worse.  She is comfortable with plans.   1. Cognitive complaints with normal exam     Patient Instructions  Continue current medications We will check dementia labs including TSH and vitamin B12 level Continue follow-up with PCP Follow-up in 1 year or sooner if  worse   There are well-accepted and sensible ways to reduce risk for Alzheimers disease and other degenerative brain disorders .  Exercise Daily Walk A daily 20 minute walk should be part of your routine. Disease related apathy can be a significant roadblock to exercise and the only way to overcome this is to make it a daily routine and perhaps have a reward at the end (something your loved one loves to eat or drink perhaps) or a personal trainer coming to the home can also be very useful. Most importantly, the patient is much more likely to exercise if the caregiver / spouse does it with him/her. In  general a structured, repetitive schedule is best.  General Health: Any diseases which effect your body will effect your brain such as a pneumonia, urinary infection, blood clot, heart attack or stroke. Keep contact with your primary care doctor for regular follow ups.  Sleep. A good nights sleep is healthy for the brain. Seven hours is recommended. If you have insomnia or poor sleep habits we can give you some instructions. If you have sleep apnea wear your mask.  Diet: Eating a heart healthy diet is also a good idea; fish and poultry instead of red meat, nuts (mostly non-peanuts), vegetables, fruits, olive oil or canola oil (instead of butter), minimal salt (use other spices to flavor foods), whole grain rice, bread, cereal and pasta and wine in moderation.Research is now showing that the MIND diet, which is a combination of The Mediterranean diet and the DASH diet, is beneficial for cognitive processing and longevity. Information about this diet can be found in The MIND Diet, a book by Doyne Keel, MS, RDN, and online at [employer]bookDist[employer].si  Finances, Power of Attorney and Advance Directives: You should consider putting legal safeguards in place with regard to financial and medical decision making. While the spouse always has power of attorney for medical and financial issues  in the absence of any form, you should consider what you want in case the spouse / caregiver is no longer around or capable of making decisions.     Orders Placed This Encounter  Procedures   TSH   Vitamin B12    No orders of the defined types were placed in this encounter.   Return in about 1 year (around 07/07/2023).  I have spent a total of 60 minutes dedicated to this patient today, preparing to see patient, performing a medically appropriate examination and evaluation, ordering tests and/or medications and procedures, and counseling and educating the patient/family/caregiver; independently interpreting result and communicating results to the family/patient/caregiver; and documenting clinical information in the electronic medical record.   Alric Ran, MD 07/06/2022, 4:19 PM  [facility] [address] [place], [place] [address] [phone]

## 2022-07-06 NOTE — Patient Instructions (Signed)
Continue current medications We will check dementia labs including TSH and vitamin B12 level Continue follow-up with PCP Follow-up in 1 year or sooner if worse   There are well-accepted and sensible ways to reduce risk for Alzheimers disease and other degenerative brain disorders .  Exercise Daily Walk A daily 20 minute walk should be part of your routine. Disease related apathy can be a significant roadblock to exercise and the only way to overcome this is to make it a daily routine and perhaps have a reward at the end (something your loved one loves to eat or drink perhaps) or a personal trainer coming to the home can also be very useful. Most importantly, the patient is much more likely to exercise if the caregiver / spouse does it with him/her. In general a structured, repetitive schedule is best.  General Health: Any diseases which effect your body will effect your brain such as a pneumonia, urinary infection, blood clot, heart attack or stroke. Keep contact with your primary care doctor for regular follow ups.  Sleep. A good nights sleep is healthy for the brain. Seven hours is recommended. If you have insomnia or poor sleep habits we can give you some instructions. If you have sleep apnea wear your mask.  Diet: Eating a heart healthy diet is also a good idea; fish and poultry instead of red meat, nuts (mostly non-peanuts), vegetables, fruits, olive oil or canola oil (instead of butter), minimal salt (use other spices to flavor foods), whole grain rice, bread, cereal and pasta and wine in moderation.Research is now showing that the MIND diet, which is a combination of The Mediterranean diet and the DASH diet, is beneficial for cognitive processing and longevity. Information about this diet can be found in The MIND Diet, a book by Doyne Keel, MS, RDN, and online at [employer]book[employer].si  Finances, Power of Attorney and Advance Directives: You should consider putting  legal safeguards in place with regard to financial and medical decision making. While the spouse always has power of attorney for medical and financial issues in the absence of any form, you should consider what you want in case the spouse / caregiver is no longer around or capable of making decisions.

## 2022-07-07 ENCOUNTER — Telehealth: Payer: Self-pay

## 2022-07-07 LAB — VITAMIN B12: Vitamin B-12: 206 pg/mL — ABNORMAL LOW (ref 232–1245)

## 2022-07-07 LAB — TSH: TSH: 1.48 u[IU]/mL (ref 0.450–4.500)

## 2022-07-07 MED ORDER — VITAMIN B-12 1000 MCG PO TABS: 1000.0000 ug | ORAL_TABLET | Freq: Every day | ORAL | 1 refills | Status: AC

## 2022-07-07 NOTE — Progress Notes (Signed)
Please call and advise the patient that the recent labs we checked showed a low Vitamin B12 level and that I will start her on Vitamin B12 supplement to take daily.  No further action is required on these tests at this time. Please remind patient to keep any upcoming appointments or tests and to call us with any interim questions, concerns, problems or updates. Thanks,   Alric Ran, MD

## 2022-07-07 NOTE — Addendum Note (Signed)
Addended byAlric Ran on: 07/07/2022 07:59 AM   Modules accepted: Orders

## 2022-07-07 NOTE — Telephone Encounter (Signed)
-----   Message from Alric Ran, MD sent at 07/07/2022  7:59 AM EDT ----- Please call and advise the patient that the recent labs we checked showed a low Vitamin B12 level and that I will start her on Vitamin B12 supplement to take daily.  No further action is required on these tests at this time. Please remind patient to keep any upcoming appointments or tests and to call us with any interim questions, concerns, problems or updates. Thanks,   Alric Ran, MD

## 2022-07-07 NOTE — Telephone Encounter (Signed)
Attempted to call pt, LVM for call back  °

## 2022-07-07 NOTE — Telephone Encounter (Signed)
Pt verified by name and DOB,  normal results given per provider, pt voiced understanding all question answered. °

## 2022-07-09 ENCOUNTER — Ambulatory Visit
Admission: RE | Admit: 2022-07-09 | Discharge: 2022-07-09 | Disposition: A | Discharge status: Home / Self Care | Payer: Medicare Other | Source: Ambulatory Visit | Attending: Family Medicine | Admitting: Family Medicine

## 2022-07-09 DIAGNOSIS — M8589 Other specified disorders of bone density and structure, multiple sites: Secondary | ICD-10-CM | POA: Diagnosis not present

## 2022-07-09 DIAGNOSIS — M8448XA Pathological fracture, other site, initial encounter for fracture: Secondary | ICD-10-CM

## 2022-07-21 DIAGNOSIS — M25511 Pain in right shoulder: Secondary | ICD-10-CM | POA: Diagnosis not present

## 2022-07-28 DIAGNOSIS — N390 Urinary tract infection, site not specified: Secondary | ICD-10-CM | POA: Diagnosis not present

## 2022-08-06 ENCOUNTER — Ambulatory Visit (INDEPENDENT_AMBULATORY_CARE_PROVIDER_SITE_OTHER): Payer: Medicare Other | Admitting: Urology

## 2022-08-06 VITALS — BP 128/69 | HR 85 | Ht 64.0 in | Wt 172.0 lb

## 2022-08-06 DIAGNOSIS — R8271 Bacteriuria: Secondary | ICD-10-CM | POA: Diagnosis not present

## 2022-08-06 DIAGNOSIS — R82998 Other abnormal findings in urine: Secondary | ICD-10-CM | POA: Diagnosis not present

## 2022-08-06 DIAGNOSIS — N390 Urinary tract infection, site not specified: Secondary | ICD-10-CM | POA: Diagnosis not present

## 2022-08-06 LAB — URINALYSIS, COMPLETE
Bilirubin, UA: NEGATIVE
Glucose, UA: NEGATIVE
Ketones, UA: NEGATIVE
Nitrite, UA: POSITIVE — AB
Protein,UA: NEGATIVE
RBC, UA: NEGATIVE
Specific Gravity, UA: 1.01 (ref 1.005–1.030)
Urobilinogen, Ur: 0.2 mg/dL (ref 0.2–1.0)
pH, UA: 6 (ref 5.0–7.5)

## 2022-08-06 LAB — MICROSCOPIC EXAMINATION

## 2022-08-06 LAB — BLADDER SCAN AMB NON-IMAGING: Scan Result: 110

## 2022-08-06 NOTE — Patient Instructions (Signed)
Cranberry and d-mannose

## 2022-08-07 NOTE — Progress Notes (Unsigned)
08/06/2022 11:12 AM   [place] Oct 01, 1946 [id number]  Referring provider: Olen Cordial, PA [address] [place],  [place] [address]  Chief Complaint  Patient presents with   Recurrent UTI    HPI: Amy Vang is a 76 y.o. female referred for evaluation of recurrent UTIs.  States that she has been treated for recurrent UTIs over the last 2-3 years Estimates she has ~ 2 UTIs per year Her only symptom is cloudy and malodorous urine Cultures have grown E. Coli Recently given an Rx for Macrobid however she held off taking until this appointment.   PMH: Past Medical History:  Diagnosis Date   Allergic rhinitis    Allergy    seasonal   Anemia    Anxiety    Cataract    removed bilateraly   Chronic constipation    COVID-19 08/2019   Degenerative disc disease, cervical    Essential hypertension    GERD (gastroesophageal reflux disease)    H/O bronchitis    Hiatal hernia    History of echocardiogram    in epic 04-21-2019,  normal w/ ef 60-65% and mild mitral regurg, no stenosis or restriction   History of TIAs    per pt age 89;  2008;  05/ 2013;  pt stated no residual was from sleep doctor more than likely sleep deprived than actual tia   Motion sickness    cars   Nocturia    OSA on CPAP    Severe ,, CPAP 12 MM H2O- Dr Radford Pax   PONV (postoperative nausea and vomiting)    Restless leg syndrome    Rosacea    Seasonal asthma    no inhaler   Sleep apnea    uses CPAP    Surgical History: Past Surgical History:  Procedure Laterality Date   ANTERIOR CERVICAL DECOMP/DISCECTOMY FUSION N/A 10/25/2017   Procedure: CERVICAL SIX- CERVICAL SEVEN ANTERIOR CERVICAL DECOMPRESSION/DISCECTOMY FUSION, INTERBODY PROSTHESIS AND ANTERIOR PLATING;  Surgeon: Newman Pies, MD;  Location: [facility];  Service: Neurosurgery;  Laterality: N/A;  CERVICAL 6- CERVICAL 7 ANTERIOR CERVICAL DECOMPRESSION/DISCECTOMY FUSION, INTERBODY PROSTHESIS AND ANTERIOR PLATING    BACK SURGERY     BILATERAL BENIGN BREAST BX'S  20 YRS AGO   BREAST BIOPSY Right    benign   BREAST EXCISIONAL BIOPSY Bilateral over 10 years ago   benign   CARDIAC CATHETERIZATION N/A 08/13/2016   Procedure: Right Heart Cath;  Surgeon: Larey Dresser, MD;  Location: [facility];  Service: Cardiovascular;  Laterality: N/A;    normal study, no evidence pulm HTN   CARPAL TUNNEL RELEASE  2009   BILATERAL   CATARACT EXTRACTION W/PHACO Right 02/27/2020   Procedure: CATARACT EXTRACTION PHACO AND INTRAOCULAR LENS PLACEMENT (IOC) RIGHT 3.60 00:23.9;  Surgeon: Birder Robson, MD;  Location: [facility];  Service: Ophthalmology;  Laterality: Right;  sleep apnea   CATARACT EXTRACTION W/PHACO Left 05/21/2020   Procedure: CATARACT EXTRACTION PHACO AND INTRAOCULAR LENS PLACEMENT (IOC) LEFT 3.69 00:30.9;  Surgeon: Birder Robson, MD;  Location: [facility];  Service: Ophthalmology;  Laterality: Left;  sleep apnea   CHOLECYSTECTOMY N/A 03/25/2021   Procedure: LAPAROSCOPIC CHOLECYSTECTOMY;  Surgeon: Rolm Bookbinder, MD;  Location: [facility];  Service: General;  Laterality: N/A;   CYSTOCELE REPAIR  12/11/2011   Procedure: ANTERIOR REPAIR (CYSTOCELE);  Surgeon: Claybon Jabs, MD;  Location: [facility];  Service: Urology;  Laterality: N/A;  1 1/2 hour requested for case  Anterior repair and mid Urethral Sling   INCISIONAL HERNIA REPAIR N/A 12/24/2021   Procedure: LAP ASSISTED INCISIONAL HERNIA REPAIR WITH MESH;  Surgeon: Rolm Bookbinder, MD;  Location: [facility];  Service: General;  Laterality: N/A;  GEN & TAP BLOCK RNFA   MASS EXCISION N/A 07/24/2019   Procedure: EXCISION OF VAGINAL FOREIGN BODY;  Surgeon: Kathie Rhodes, MD;  Location: [facility];  Service: Urology;  Laterality: N/A;   NASAL SEPTOPLASTY W/ TURBINOPLASTY Bilateral 11/18/2016   Procedure: NASAL SEPTOPLASTY WITH BILATERAL TURBINATE REDUCTION;  Surgeon: Jerrell Belfast, MD;  Location: [facility];  Service: ENT;  Laterality: Bilateral;   NONINVASIVE VASCULAR CAROTID STUDY  09/14/2007   BILATERAL MILD MIX PLAQUE THROUGHOUT, NO SIGNIFICANT BILATERAL ICA STENOSIS   PUBOVAGINAL SLING  12/11/2011   Procedure: Gaynelle Arabian;  Surgeon: Claybon Jabs, MD;  Location: [facility];  Service: Urology;  Laterality: N/A;   PULLEY RELEASE RIGHT THUMB  2009   SHOULDER ARTHROSCOPY DISTAL CLAVICLE EXCISION AND OPEN ROTATOR CUFF REPAIR Right 01-17-2019   dr supple '@SCG'$    SINUS ENDO WITH FUSION Bilateral 11/18/2016   Procedure: BILATERAL ENDOSCOPIC SINUS SURGERY;  Surgeon: Jerrell Belfast, MD;  Location: [facility];  Service: ENT;  Laterality: Bilateral;   UMBILICAL HERNIA REPAIR N/A 03/25/2021   Procedure: Primary REPAIR UMBILICAL HERNIA;  Surgeon: Rolm Bookbinder, MD;  Location: [facility];  Service: General;  Laterality: N/A;   UPPER GASTROINTESTINAL ENDOSCOPY     VAGINAL HYSTERECTOMY  AGE 16    Home Medications:  Allergies as of 08/06/2022       Reactions   Nsaids     Abdominal Pain   Other Other (See Comments)   Hydroxyzine Hcl    UNSPECIFIED REACTION    Promethazine Hcl    hyperactivity   Sulfamethoxazole-trimethoprim Other (See Comments)   Zolpidem    groggy   Zolpidem Tartrate Other (See Comments)   Lactose Intolerance (gi) Other (See Comments)   bloating   Penicillins Hives, Rash, Other (See Comments)   Patient has had a PCN reaction with immediate rash, facial/tongue/throat swelling, SOB, and or lightheadedness with hypotension; Has also developed severe rash involving mucus membranes or skin necrosis.  Has patient had a PCN reaction that required hospitalization No Has patient had a PCN reaction occurring within the last 10 years: No If all of the above answers are "NO", then may proceed with Cephalosporin use.   Septra [sulfamethoxazole-trimethoprim] Rash        Medication List        Accurate as of August 06, 2022 11:59 PM. If you have any questions,  ask your nurse or doctor.          acetaminophen 500 MG tablet Commonly known as: TYLENOL Take 500-1,000 mg by mouth every 6 (six) hours as needed for mild pain or headache.   albuterol 108 (90 Base) MCG/ACT inhaler Commonly known as: VENTOLIN HFA Inhale 1-2 puffs into the lungs every 6 (six) hours as needed for wheezing or shortness of breath.   citalopram 20 MG tablet Commonly known as: CELEXA Take 10 mg by mouth at bedtime.   cyanocobalamin 1000 MCG tablet Commonly known as: VITAMIN B12 Take 1 tablet (1,000 mcg total) by mouth daily.   cyclobenzaprine 10 MG tablet Commonly known as: FLEXERIL Take 1 tablet (10 mg total) by mouth 3 (three) times daily as needed for muscle spasms. What changed: when to take this   dicyclomine 10 MG capsule Commonly known as: BENTYL Take 1  capsule (10 mg total) by mouth 4 (four) times daily as needed for spasms.   famotidine 40 MG tablet Commonly known as: PEPCID Take 40 mg by mouth at bedtime.   fexofenadine 180 MG tablet Commonly known as: ALLEGRA Take 180 mg by mouth daily.   hydrochlorothiazide 25 MG tablet Commonly known as: HYDRODIURIL Take 1 tablet (25 mg total) by mouth daily.   HYDROcodone-acetaminophen 5-325 MG tablet Commonly known as: NORCO/VICODIN Take 1 tablet by mouth every 6 (six) hours as needed for severe pain.   HYDROcodone-acetaminophen 5-325 MG tablet Commonly known as: NORCO/VICODIN Take 1 tablet by mouth every 4 (four) hours as needed for moderate pain or severe pain.   methocarbamol 500 MG tablet Commonly known as: ROBAXIN Take 500 mg by mouth every 6 (six) hours as needed for muscle spasms.   ondansetron 4 MG disintegrating tablet Commonly known as: ZOFRAN-ODT Take 1 tablet (4 mg total) by mouth every 6 (six) hours as needed.   pantoprazole 40 MG tablet Commonly known as: PROTONIX Take 1 tablet (40 mg total) by mouth 2 (two) times daily.   potassium chloride 10 MEQ CR capsule Commonly known as:  MICRO-K Take 20 mEq by mouth daily.   promethazine 25 MG tablet Commonly known as: PHENERGAN Take 1 tablet (25 mg total) by mouth every 6 (six) hours as needed for nausea or vomiting.   traMADol 50 MG tablet Commonly known as: ULTRAM Take 50 mg by mouth every 6 (six) hours as needed for moderate pain.   traZODone 50 MG tablet Commonly known as: DESYREL Take 25 mg by mouth at bedtime.        Allergies:  Allergies  Allergen Reactions   Nsaids      Abdominal Pain   Other Other (See Comments)   Hydroxyzine Hcl     UNSPECIFIED REACTION    Promethazine Hcl     hyperactivity   Sulfamethoxazole-Trimethoprim Other (See Comments)   Zolpidem     groggy   Zolpidem Tartrate Other (See Comments)   Lactose Intolerance (Gi) Other (See Comments)    bloating   Penicillins Hives, Rash and Other (See Comments)    Patient has had a PCN reaction with immediate rash, facial/tongue/throat swelling, SOB, and or lightheadedness with hypotension; Has also developed severe rash involving mucus membranes or skin necrosis.   Has patient had a PCN reaction that required hospitalization No Has patient had a PCN reaction occurring within the last 10 years: No If all of the above answers are "NO", then may proceed with Cephalosporin use.    Septra [Sulfamethoxazole-Trimethoprim] Rash    Family History: Family History  Adopted: Yes  Problem Relation Age of Onset   Diabetes Mother    Stroke Mother    Alzheimer's disease Mother    Hypertension Sister    Diabetes Sister    Heart attack Brother    Leukemia Brother    CAD Brother    Diabetes Brother    Colon cancer Maternal Aunt    Stroke Paternal Grandmother    Heart attack Paternal Grandfather    Esophageal cancer Neg Hx    Rectal cancer Neg Hx    Stomach cancer Neg Hx     Social History:  reports that she has never smoked. She has never used smokeless tobacco. She reports that she does not drink alcohol and does not use  drugs.   Physical Exam: BP 128/69   Pulse 85   Ht '5\' 4"'$  (1.626 m)   Wt 172  lb (78 kg)   BMI 29.52 kg/m   Constitutional:  Alert and oriented, No acute distress. HEENT: [place] AT Respiratory: Normal respiratory effort, no increased work of breathing. Psychiatric: Normal mood and affect.  Laboratory Data:  Urinalysis Microscopy 11-30 WBC/many bacteria     Assessment & Plan:   76 y.o. female with recurrent bacteriuria Based on AUA guidelines cloudy and malodorous urine is not considered a UTI symptoms and treatment of asymptomatic bacteriuria is not recommended PVR was 110 cc Recommend trial Estrace vaginal cream twice weekly Start cranberry supplements Follow-up office visit 3 months.  Call earlier for symptomatic infection   Abbie Sons, MD  [facility] [address], [place], [place] [address] [phone]

## 2022-08-09 ENCOUNTER — Encounter: Payer: Self-pay | Admitting: Urology

## 2022-08-09 MED ORDER — ESTRADIOL 0.1 MG/GM VA CREA: TOPICAL_CREAM | VAGINAL | 1 refills | Status: DC

## 2022-08-11 LAB — CULTURE, URINE COMPREHENSIVE

## 2022-08-19 DIAGNOSIS — G4733 Obstructive sleep apnea (adult) (pediatric): Secondary | ICD-10-CM | POA: Diagnosis not present

## 2022-09-01 ENCOUNTER — Other Ambulatory Visit: Payer: Self-pay

## 2022-09-01 ENCOUNTER — Ambulatory Visit
Admission: EM | Admit: 2022-09-01 | Discharge: 2022-09-01 | Disposition: A | Discharge status: Home / Self Care | Payer: Medicare Other | Attending: Emergency Medicine | Admitting: Emergency Medicine

## 2022-09-01 DIAGNOSIS — J209 Acute bronchitis, unspecified: Secondary | ICD-10-CM

## 2022-09-01 MED ORDER — AZITHROMYCIN 250 MG PO TABS: 250.0000 mg | ORAL_TABLET | Freq: Every day | ORAL | 0 refills | Status: DC

## 2022-09-01 NOTE — ED Triage Notes (Signed)
Patient to Urgent Care with complaints of dry cough, chest congestion, fatigue and nasal congestion. Denies any known fevers.  Symptoms started 12 days ago, worsened over the last two days.  After 5 days of symptoms patient took home covid test that was negative.

## 2022-09-01 NOTE — Discharge Instructions (Addendum)
Take the Zithromax as directed.  Use your albuterol inhaler as directed.  Follow up with your primary care provider if your symptoms are not improving.

## 2022-09-01 NOTE — ED Provider Notes (Signed)
[name]    CSN: [id number] Arrival date & time: 09/01/22  1149      History   Chief Complaint Chief Complaint  Patient presents with   Cough   Nasal Congestion    HPI Amy Vang is a 76 y.o. female.  Patient presents with 12-day history of cough and congestion.  No fever, chills, chest pain, shortness of breath, vomiting, diarrhea, or other symptoms.  Treatment at home with prednisone daily x5 days which she had previously been prescribed.  She also has taken OTC cough medication.  She has not used her albuterol inhaler.  Patient was seen here on 05/30/2022; diagnosed with cough and viral URI; treated with Tessalon Perles, prednisone, albuterol inhaler.  Her medical history includes asthma, seasonal allergies, chronic sinusitis, OSA, hypertension, pulmonary hypertension, diverticulosis, GERD, IBS, overactive bladder, restless leg syndrome.  The history is provided by the patient and medical records.    Past Medical History:  Diagnosis Date   Allergic rhinitis    Allergy    seasonal   Anemia    Anxiety    Cataract    removed bilateraly   Chronic constipation    COVID-19 08/2019   Degenerative disc disease, cervical    Essential hypertension    GERD (gastroesophageal reflux disease)    H/O bronchitis    Hiatal hernia    History of echocardiogram    in epic 04-21-2019,  normal w/ ef 60-65% and mild mitral regurg, no stenosis or restriction   History of TIAs    per pt age 57;  2008;  05/ 2013;  pt stated no residual was from sleep doctor more than likely sleep deprived than actual tia   Motion sickness    cars   Nocturia    OSA on CPAP    Severe ,, CPAP 12 MM H2O- Dr Radford Pax   PONV (postoperative nausea and vomiting)    Restless leg syndrome    Rosacea    Seasonal asthma    no inhaler   Sleep apnea    uses CPAP    Patient Active Problem List   Diagnosis Date Noted   Abnormal vaginal bleeding 12/17/2021   Allergic rhinitis 12/17/2021    Adjustment disorder 12/17/2021   Cholelithiasis without obstruction 12/17/2021   Constipation 12/17/2021   Degeneration of lumbar intervertebral disc 12/17/2021   Diverticulosis of sigmoid colon 12/17/2021   Fatty liver 12/17/2021   Functional abdominal pain syndrome 12/17/2021   Gastro-esophageal reflux disease without esophagitis 12/17/2021   Hypo-osmolality and hyponatremia 12/17/2021   Internal hemorrhoids 12/17/2021   Irritable bowel syndrome without diarrhea 12/17/2021   Lymphocytosis 12/17/2021   Nausea and vomiting 12/17/2021   Overactive bladder 12/17/2021   Personal history of colonic polyps 12/17/2021   Personal history of transient ischemic attack (TIA), and cerebral infarction without residual deficits 12/17/2021   Insomnia 12/17/2021   Pure hypercholesterolemia 12/17/2021   Restless legs syndrome 12/17/2021   Rosacea 12/17/2021   Sleep-wake schedule disorder, 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   Piriformis syndrome of right side 01/01/2021   Lower limb pain, inferior, right 09/19/2020   Lumbosacral radiculitis 09/19/2020   Body mass index (BMI) 25.0-25.9, adult 06/17/2020   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   Annular tear of lumbar disc 01/24/2020   Acute back pain with sciatica 12/28/2019   Encounter for orthopedic follow-up care 01/17/2019   Partial thickness rotator cuff tear 11/07/2018   Acquired trigger finger 10/28/2018   Pain in joint of right shoulder 05/06/2018  Hoarseness 12/22/2017   Cervical spondylosis with radiculopathy 10/25/2017   Neck pain 08/31/2017   Other cervical disc displacement, unspecified cervical region 08/31/2017   Sinusitis, chronic 11/18/2016   Deviated septum 09/18/2016   Dyspnea and respiratory abnormality 05/21/2016   Wheeze 05/21/2016   Chronic throat clearing 05/21/2016   Pulmonary HTN ([place]) 04/23/2016   Moderate persistent asthma 11/12/2015   Allergic rhinoconjunctivitis 11/12/2015   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   SOB  (shortness of breath) 03/06/2015   OSA (obstructive sleep apnea) 01/01/2014   Benign essential HTN 01/01/2014   Obesity 01/01/2014    Past Surgical History:  Procedure Laterality Date   ANTERIOR CERVICAL DECOMP/DISCECTOMY FUSION N/A 10/25/2017   Procedure: CERVICAL SIX- CERVICAL SEVEN ANTERIOR CERVICAL DECOMPRESSION/DISCECTOMY FUSION, INTERBODY PROSTHESIS AND ANTERIOR PLATING;  Surgeon: Newman Pies, MD;  Location: [facility];  Service: Neurosurgery;  Laterality: N/A;  CERVICAL 6- CERVICAL 7 ANTERIOR CERVICAL DECOMPRESSION/DISCECTOMY FUSION, INTERBODY PROSTHESIS AND ANTERIOR PLATING   BACK SURGERY     BILATERAL BENIGN BREAST BX'S  20 YRS AGO   BREAST BIOPSY Right    benign   BREAST EXCISIONAL BIOPSY Bilateral over 10 years ago   benign   CARDIAC CATHETERIZATION N/A 08/13/2016   Procedure: Right Heart Cath;  Surgeon: Larey Dresser, MD;  Location: [facility];  Service: Cardiovascular;  Laterality: N/A;    normal study, no evidence pulm HTN   CARPAL TUNNEL RELEASE  2009   BILATERAL   CATARACT EXTRACTION W/PHACO Right 02/27/2020   Procedure: CATARACT EXTRACTION PHACO AND INTRAOCULAR LENS PLACEMENT (IOC) RIGHT 3.60 00:23.9;  Surgeon: Birder Robson, MD;  Location: [place];  Service: Ophthalmology;  Laterality: Right;  sleep apnea   CATARACT EXTRACTION W/PHACO Left 05/21/2020   Procedure: CATARACT EXTRACTION PHACO AND INTRAOCULAR LENS PLACEMENT (IOC) LEFT 3.69 00:30.9;  Surgeon: Birder Robson, MD;  Location: [place];  Service: Ophthalmology;  Laterality: Left;  sleep apnea   CHOLECYSTECTOMY N/A 03/25/2021   Procedure: LAPAROSCOPIC CHOLECYSTECTOMY;  Surgeon: Rolm Bookbinder, MD;  Location: [place];  Service: General;  Laterality: N/A;   CYSTOCELE REPAIR  12/11/2011   Procedure: ANTERIOR REPAIR (CYSTOCELE);  Surgeon: Claybon Jabs, MD;  Location: [facility];  Service: Urology;  Laterality: N/A;  1 1/2 hour requested for case  Anterior  repair and mid Urethral Sling   INCISIONAL HERNIA REPAIR N/A 12/24/2021   Procedure: LAP ASSISTED INCISIONAL HERNIA REPAIR WITH MESH;  Surgeon: Rolm Bookbinder, MD;  Location: [place];  Service: General;  Laterality: N/A;  GEN & TAP BLOCK RNFA   MASS EXCISION N/A 07/24/2019   Procedure: EXCISION OF VAGINAL FOREIGN BODY;  Surgeon: Kathie Rhodes, MD;  Location: [facility];  Service: Urology;  Laterality: N/A;   NASAL SEPTOPLASTY W/ TURBINOPLASTY Bilateral 11/18/2016   Procedure: NASAL SEPTOPLASTY WITH BILATERAL TURBINATE REDUCTION;  Surgeon: Jerrell Belfast, MD;  Location: [place];  Service: ENT;  Laterality: Bilateral;   NONINVASIVE VASCULAR CAROTID STUDY  09/14/2007   BILATERAL MILD MIX PLAQUE THROUGHOUT, NO SIGNIFICANT BILATERAL ICA STENOSIS   PUBOVAGINAL SLING  12/11/2011   Procedure: Gaynelle Arabian;  Surgeon: Claybon Jabs, MD;  Location: [facility];  Service: Urology;  Laterality: N/A;   PULLEY RELEASE RIGHT THUMB  2009   SHOULDER ARTHROSCOPY DISTAL CLAVICLE EXCISION AND OPEN ROTATOR CUFF REPAIR Right 01-17-2019   dr supple '@SCG'$    SINUS ENDO WITH FUSION Bilateral 11/18/2016   Procedure: BILATERAL ENDOSCOPIC SINUS SURGERY;  Surgeon: Jerrell Belfast, MD;  Location: [place];  Service: ENT;  Laterality: Bilateral;   UMBILICAL HERNIA REPAIR N/A 03/25/2021   Procedure: Primary REPAIR UMBILICAL HERNIA;  Surgeon: Rolm Bookbinder, MD;  Location: [place];  Service: General;  Laterality: N/A;   UPPER GASTROINTESTINAL ENDOSCOPY     VAGINAL HYSTERECTOMY  AGE 55    OB History   No obstetric history on file.      Home Medications    Prior to Admission medications   Medication Sig Start Date End Date Taking? Authorizing Provider  azithromycin (ZITHROMAX) 250 MG tablet Take 1 tablet (250 mg total) by mouth daily. Take first 2 tablets together, then 1 every day until finished. 09/01/22  Yes Sharion Balloon, NP  acetaminophen (TYLENOL) 500 MG tablet Take 500-1,000 mg  by mouth every 6 (six) hours as needed for mild pain or headache.    [provider]  albuterol (VENTOLIN HFA) 108 (90 Base) MCG/ACT inhaler Inhale 1-2 puffs into the lungs every 6 (six) hours as needed for wheezing or shortness of breath. 05/30/22   Sharion Balloon, NP  citalopram (CELEXA) 20 MG tablet Take 10 mg by mouth at bedtime.    [provider]  cyanocobalamin (VITAMIN B12) 1000 MCG tablet Take 1 tablet (1,000 mcg total) by mouth daily. 07/07/22 01/03/23  Alric Ran, MD  cyclobenzaprine (FLEXERIL) 10 MG tablet Take 1 tablet (10 mg total) by mouth 3 (three) times daily as needed for muscle spasms. Patient taking differently: Take 10 mg by mouth at bedtime as needed for muscle spasms. 10/25/17   Newman Pies, MD  dicyclomine (BENTYL) 10 MG capsule Take 1 capsule (10 mg total) by mouth 4 (four) times daily as needed for spasms. 11/17/21   Gatha Mayer, MD  estradiol (ESTRACE) 0.1 MG/GM vaginal cream Apply a pea-sized amount to fingertip and wipe in vaginal introitus twice weekly 08/09/22   Abbie Sons, MD  famotidine (PEPCID) 40 MG tablet Take 40 mg by mouth at bedtime.    [provider]  fexofenadine (ALLEGRA) 180 MG tablet Take 180 mg by mouth daily.    [provider]  hydrochlorothiazide (HYDRODIURIL) 25 MG tablet Take 1 tablet (25 mg total) by mouth daily. 05/30/19   Minna Merritts, MD  HYDROcodone-acetaminophen (NORCO/VICODIN) 5-325 MG tablet Take 1 tablet by mouth every 6 (six) hours as needed for severe pain. 08/27/21   [provider]  HYDROcodone-acetaminophen (NORCO/VICODIN) 5-325 MG tablet Take 1 tablet by mouth every 4 (four) hours as needed for moderate pain or severe pain. 12/24/21   Rolm Bookbinder, MD  methocarbamol (ROBAXIN) 500 MG tablet Take 500 mg by mouth every 6 (six) hours as needed for muscle spasms.    [provider]  ondansetron (ZOFRAN-ODT) 4 MG disintegrating tablet Take 1 tablet (4 mg total) by mouth  every 6 (six) hours as needed. 06/23/22   Gatha Mayer, MD  pantoprazole (PROTONIX) 40 MG tablet Take 1 tablet (40 mg total) by mouth 2 (two) times daily. 11/17/21   Gatha Mayer, MD  potassium chloride (MICRO-K) 10 MEQ CR capsule Take 20 mEq by mouth daily. 03/23/15   [provider]  promethazine (PHENERGAN) 25 MG tablet Take 1 tablet (25 mg total) by mouth every 6 (six) hours as needed for nausea or vomiting. 06/23/22   Gatha Mayer, MD  traMADol (ULTRAM) 50 MG tablet Take 50 mg by mouth every 6 (six) hours as needed for moderate pain.    [provider]  traZODone (DESYREL) 50 MG tablet Take  25 mg by mouth at bedtime.    [provider]    Family History Family History  Adopted: Yes  Problem Relation Age of Onset   Diabetes Mother    Stroke Mother    Alzheimer's disease Mother    Hypertension Sister    Diabetes Sister    Heart attack Brother    Leukemia Brother    CAD Brother    Diabetes Brother    Colon cancer Maternal Aunt    Stroke Paternal Grandmother    Heart attack Paternal Grandfather    Esophageal cancer Neg Hx    Rectal cancer Neg Hx    Stomach cancer Neg Hx     Social History Social History   Tobacco Use   Smoking status: Never   Smokeless tobacco: Never  Vaping Use   Vaping Use: Never used  Substance Use Topics   Alcohol use: No   Drug use: No     Allergies   Nsaids, Other, Hydroxyzine hcl, Promethazine hcl, Sulfamethoxazole-trimethoprim, Zolpidem, Zolpidem tartrate, Lactose intolerance (gi), Penicillins, and Septra [sulfamethoxazole-trimethoprim]   Review of Systems Review of Systems  Constitutional:  Negative for chills and fever.  HENT:  Positive for congestion. Negative for ear pain and sore throat.   Respiratory:  Positive for cough. Negative for shortness of breath.   Cardiovascular:  Negative for chest pain and palpitations.  Gastrointestinal:  Negative for diarrhea and vomiting.  Skin:  Negative for color  change and rash.  All other systems reviewed and are negative.    Physical Exam Triage Vital Signs ED Triage Vitals [09/01/22 1158]  Enc Vitals Group     BP (!) 144/65     Pulse Rate 83     Resp 18     Temp 98.4 F (36.9 C)     Temp src      SpO2 96 %     Weight      Height      Head Circumference      Peak Flow      Pain Score      Pain Loc      Pain Edu?      Excl. in [place]?    No data found.  Updated Vital Signs BP (!) 144/65   Pulse 83   Temp 98.4 F (36.9 C)   Resp 18   Ht '5\' 4"'$  (1.626 m)   Wt 170 lb (77.1 kg)   SpO2 96%   BMI 29.18 kg/m   Visual Acuity Right Eye Distance:   Left Eye Distance:   Bilateral Distance:    Right Eye Near:   Left Eye Near:    Bilateral Near:     Physical Exam Vitals and nursing note reviewed.  Constitutional:      General: She is not in acute distress.    Appearance: Normal appearance. She is well-developed. She is not ill-appearing.  HENT:     Right Ear: Tympanic membrane normal.     Left Ear: Tympanic membrane normal.     Nose: Nose normal.     Mouth/Throat:     Mouth: Mucous membranes are moist.     Pharynx: Oropharynx is clear.     Comments: Clear PND. Cardiovascular:     Rate and Rhythm: Normal rate and regular rhythm.     Heart sounds: Normal heart sounds.  Pulmonary:     Effort: Pulmonary effort is normal. No respiratory distress.     Breath sounds: Normal breath sounds.  Musculoskeletal:  Cervical back: Neck supple.  Skin:    General: Skin is warm and dry.  Neurological:     Mental Status: She is alert.  Psychiatric:        Mood and Affect: Mood normal.        Behavior: Behavior normal.      UC Treatments / Results  Labs (all labs ordered are listed, but only abnormal results are displayed) Labs Reviewed - No data to display  EKG   Radiology No results found.  Procedures Procedures (including critical care time)  Medications Ordered in UC Medications - No data to display  Initial  Impression / Assessment and Plan / UC Course  I have reviewed the triage vital signs and the nursing notes.  Pertinent labs & imaging results that were available during my care of the patient were reviewed by me and considered in my medical decision making (see chart for details).    Acute bronchitis.  Patient has been symptomatic for 12 days.  She has been taking prednisone for the past 5 days which was leftover from a previous illness.  Treating today with Zithromax.  Instructed patient to use her albuterol inhaler as prescribed.  Instructed patient to follow up with her PCP if her symptoms are not improving.  She agrees to plan of care.    Final Clinical Impressions(s) / UC Diagnoses   Final diagnoses:  Acute bronchitis, unspecified organism     Discharge Instructions      Take the Zithromax as directed.  Use your albuterol inhaler as directed.  Follow up with your primary care provider if your symptoms are not improving.        ED Prescriptions     Medication Sig Dispense Auth. Provider   azithromycin (ZITHROMAX) 250 MG tablet Take 1 tablet (250 mg total) by mouth daily. Take first 2 tablets together, then 1 every day until finished. 6 tablet Sharion Balloon, NP      PDMP not reviewed this encounter.   Sharion Balloon, NP 09/01/22 1240

## 2022-09-04 DIAGNOSIS — J209 Acute bronchitis, unspecified: Secondary | ICD-10-CM | POA: Diagnosis not present

## 2022-09-10 DIAGNOSIS — J40 Bronchitis, not specified as acute or chronic: Secondary | ICD-10-CM | POA: Diagnosis not present

## 2022-09-10 DIAGNOSIS — K219 Gastro-esophageal reflux disease without esophagitis: Secondary | ICD-10-CM | POA: Diagnosis not present

## 2022-09-27 DIAGNOSIS — G4733 Obstructive sleep apnea (adult) (pediatric): Secondary | ICD-10-CM | POA: Diagnosis not present

## 2022-10-06 ENCOUNTER — Other Ambulatory Visit: Payer: Self-pay | Admitting: Internal Medicine

## 2022-10-06 MED ORDER — PROMETHAZINE HCL 25 MG PO TABS: 25.0000 mg | ORAL_TABLET | Freq: Four times a day (QID) | ORAL | 0 refills | Status: DC | PRN

## 2022-10-06 MED ORDER — ONDANSETRON 4 MG PO TBDP: 4.0000 mg | ORAL_TABLET | Freq: Four times a day (QID) | ORAL | 0 refills | Status: DC | PRN

## 2022-10-12 ENCOUNTER — Ambulatory Visit (INDEPENDENT_AMBULATORY_CARE_PROVIDER_SITE_OTHER): Payer: Medicare Other | Admitting: Podiatry

## 2022-10-12 DIAGNOSIS — D2372 Other benign neoplasm of skin of left lower limb, including hip: Secondary | ICD-10-CM | POA: Diagnosis not present

## 2022-10-12 NOTE — Progress Notes (Signed)
  Subjective:  Patient ID: Amy Vang, female    DOB: 08-31-1946,  MRN: [id number]  Chief Complaint  Patient presents with   Callouses    left foot pain-hard place on the side of foot    76 y.o. female presents with the above complaint. History confirmed with patient.  Lesion continues to grow back Objective:  Physical Exam: warm, good capillary refill, no trophic changes or ulcerative lesions, normal DP and PT pulses and normal sensory exam. Left Foot: Benign-appearing hard painful skin submetatarsal 5 Assessment:   1. Benign neoplasm of skin of left foot      Plan:  Patient was evaluated and treated and all questions answered.  We again discussed etiology of these and use of salicylic acid on a regular basis as these continue to return.  She has not tried this yet and will try it.  The lesion was debrided and destroyed today using salinocaine #312 blade.  She tolerated this well.  She will turn as needed.  Return if symptoms worsen or fail to improve.

## 2022-10-19 DIAGNOSIS — Z0000 Encounter for general adult medical examination without abnormal findings: Secondary | ICD-10-CM | POA: Diagnosis not present

## 2022-10-19 DIAGNOSIS — G2581 Restless legs syndrome: Secondary | ICD-10-CM | POA: Diagnosis not present

## 2022-10-19 DIAGNOSIS — K760 Fatty (change of) liver, not elsewhere classified: Secondary | ICD-10-CM | POA: Diagnosis not present

## 2022-10-19 DIAGNOSIS — E7800 Pure hypercholesterolemia, unspecified: Secondary | ICD-10-CM | POA: Diagnosis not present

## 2022-10-19 DIAGNOSIS — G4733 Obstructive sleep apnea (adult) (pediatric): Secondary | ICD-10-CM | POA: Diagnosis not present

## 2022-10-19 DIAGNOSIS — F5101 Primary insomnia: Secondary | ICD-10-CM | POA: Diagnosis not present

## 2022-10-19 DIAGNOSIS — I10 Essential (primary) hypertension: Secondary | ICD-10-CM | POA: Diagnosis not present

## 2022-10-27 ENCOUNTER — Ambulatory Visit
Admission: EM | Admit: 2022-10-27 | Discharge: 2022-10-27 | Disposition: A | Discharge status: Home / Self Care | Payer: Medicare Other

## 2022-10-27 ENCOUNTER — Other Ambulatory Visit: Payer: Self-pay | Admitting: Internal Medicine

## 2022-10-27 DIAGNOSIS — J209 Acute bronchitis, unspecified: Secondary | ICD-10-CM | POA: Diagnosis not present

## 2022-10-27 DIAGNOSIS — J0100 Acute maxillary sinusitis, unspecified: Secondary | ICD-10-CM

## 2022-10-27 MED ORDER — AZITHROMYCIN 250 MG PO TABS: 250.0000 mg | ORAL_TABLET | Freq: Every day | ORAL | 0 refills | Status: DC

## 2022-10-27 MED ORDER — PREDNISONE 10 MG PO TABS: 40.0000 mg | ORAL_TABLET | Freq: Every day | ORAL | 0 refills | Status: AC

## 2022-10-27 NOTE — ED Triage Notes (Signed)
Patient states that she started with a cough. Pt reports that she has a tightness in her neck into her ears. Denies fever.

## 2022-10-27 NOTE — Discharge Instructions (Addendum)
Continue using your albuterol.  Take the prednisone and Zithromax as directed.  Follow up with your primary care provider.

## 2022-10-27 NOTE — ED Provider Notes (Signed)
Amy Vang    CSN: [id number] Arrival date & time: 10/27/22  1115      History   Chief Complaint Chief Complaint  Patient presents with   Cough    HPI Amy Vang is a 76 y.o. female.  Patient presents with 5 day history of cough which is occasionally productive of unknown color of sputum.  She is getting worse and has developed ear pain, hoarse voice, and sore throat.  She denies wheezing, shortness of breath, chest pain, fever, or other symptoms but she has needed to use her albuterol.  Her medical history includes allergic rhinitis, chronic sinusitis, asthma, pulmonary hypertension, hypertension.   The history is provided by the patient and medical records.    Past Medical History:  Diagnosis Date   Allergic rhinitis    Allergy    seasonal   Anemia    Anxiety    Cataract    removed bilateraly   Chronic constipation    COVID-19 08/2019   Degenerative disc disease, cervical    Essential hypertension    GERD (gastroesophageal reflux disease)    H/O bronchitis    Hiatal hernia    History of echocardiogram    in epic 04-21-2019,  normal w/ ef 60-65% and mild mitral regurg, no stenosis or restriction   History of TIAs    per pt age 62;  2008;  05/ 2013;  pt stated no residual was from sleep doctor more than likely sleep deprived than actual tia   Motion sickness    cars   Nocturia    OSA on CPAP    Severe ,, CPAP 12 MM H2O- Dr Radford Pax   PONV (postoperative nausea and vomiting)    Restless leg syndrome    Rosacea    Seasonal asthma    no inhaler   Sleep apnea    uses CPAP    Patient Active Problem List   Diagnosis Date Noted   Abnormal vaginal bleeding 12/17/2021   Allergic rhinitis 12/17/2021   Adjustment disorder 12/17/2021   Cholelithiasis without obstruction 12/17/2021   Constipation 12/17/2021   Degeneration of lumbar intervertebral disc 12/17/2021   Diverticulosis of sigmoid colon 12/17/2021   Fatty liver 12/17/2021   Functional  abdominal pain syndrome 12/17/2021   Gastro-esophageal reflux disease without esophagitis 12/17/2021   Hypo-osmolality and hyponatremia 12/17/2021   Internal hemorrhoids 12/17/2021   Irritable bowel syndrome without diarrhea 12/17/2021   Lymphocytosis 12/17/2021   Nausea and vomiting 12/17/2021   Overactive bladder 12/17/2021   Personal history of colonic polyps 12/17/2021   Personal history of transient ischemic attack (TIA), and cerebral infarction without residual deficits 12/17/2021   Insomnia 12/17/2021   Pure hypercholesterolemia 12/17/2021   Restless legs syndrome 12/17/2021   Rosacea 12/17/2021   Sleep-wake schedule disorder, 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   Piriformis syndrome of right side 01/01/2021   Lower limb pain, inferior, right 09/19/2020   Lumbosacral radiculitis 09/19/2020   Body mass index (BMI) 25.0-25.9, adult 06/17/2020   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   Annular tear of lumbar disc 01/24/2020   Acute back pain with sciatica 12/28/2019   Encounter for orthopedic follow-up care 01/17/2019   Partial thickness rotator cuff tear 11/07/2018   Acquired trigger finger 10/28/2018   Pain in joint of right shoulder 05/06/2018   Hoarseness 12/22/2017   Cervical spondylosis with radiculopathy 10/25/2017   Neck pain 08/31/2017   Other cervical disc displacement, unspecified cervical region 08/31/2017   Sinusitis, chronic 11/18/2016   Deviated septum  09/18/2016   Dyspnea and respiratory abnormality 05/21/2016   Wheeze 05/21/2016   Chronic throat clearing 05/21/2016   Pulmonary HTN ([place]) 04/23/2016   Moderate persistent asthma 11/12/2015   Allergic rhinoconjunctivitis 11/12/2015   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   SOB (shortness of breath) 03/06/2015   OSA (obstructive sleep apnea) 01/01/2014   Benign essential HTN 01/01/2014   Obesity 01/01/2014    Past Surgical History:  Procedure Laterality Date   ANTERIOR CERVICAL DECOMP/DISCECTOMY FUSION N/A 10/25/2017    Procedure: CERVICAL SIX- CERVICAL SEVEN ANTERIOR CERVICAL DECOMPRESSION/DISCECTOMY FUSION, INTERBODY PROSTHESIS AND ANTERIOR PLATING;  Surgeon: Newman Pies, MD;  Location: [facility];  Service: Neurosurgery;  Laterality: N/A;  CERVICAL 6- CERVICAL 7 ANTERIOR CERVICAL DECOMPRESSION/DISCECTOMY FUSION, INTERBODY PROSTHESIS AND ANTERIOR PLATING   BACK SURGERY     BILATERAL BENIGN BREAST BX'S  20 YRS AGO   BREAST BIOPSY Right    benign   BREAST EXCISIONAL BIOPSY Bilateral over 10 years ago   benign   CARDIAC CATHETERIZATION N/A 08/13/2016   Procedure: Right Heart Cath;  Surgeon: Larey Dresser, MD;  Location: [facility];  Service: Cardiovascular;  Laterality: N/A;    normal study, no evidence pulm HTN   CARPAL TUNNEL RELEASE  2009   BILATERAL   CATARACT EXTRACTION W/PHACO Right 02/27/2020   Procedure: CATARACT EXTRACTION PHACO AND INTRAOCULAR LENS PLACEMENT (IOC) RIGHT 3.60 00:23.9;  Surgeon: Birder Robson, MD;  Location: [facility];  Service: Ophthalmology;  Laterality: Right;  sleep apnea   CATARACT EXTRACTION W/PHACO Left 05/21/2020   Procedure: CATARACT EXTRACTION PHACO AND INTRAOCULAR LENS PLACEMENT (IOC) LEFT 3.69 00:30.9;  Surgeon: Birder Robson, MD;  Location: [facility];  Service: Ophthalmology;  Laterality: Left;  sleep apnea   CHOLECYSTECTOMY N/A 03/25/2021   Procedure: LAPAROSCOPIC CHOLECYSTECTOMY;  Surgeon: Rolm Bookbinder, MD;  Location: [facility];  Service: General;  Laterality: N/A;   CYSTOCELE REPAIR  12/11/2011   Procedure: ANTERIOR REPAIR (CYSTOCELE);  Surgeon: Claybon Jabs, MD;  Location: [facility];  Service: Urology;  Laterality: N/A;  1 1/2 hour requested for case  Anterior repair and mid Urethral Sling   INCISIONAL HERNIA REPAIR N/A 12/24/2021   Procedure: LAP ASSISTED INCISIONAL HERNIA REPAIR WITH MESH;  Surgeon: Rolm Bookbinder, MD;  Location: [facility];  Service: General;  Laterality: N/A;  GEN & TAP BLOCK RNFA   MASS  EXCISION N/A 07/24/2019   Procedure: EXCISION OF VAGINAL FOREIGN BODY;  Surgeon: Kathie Rhodes, MD;  Location: [facility];  Service: Urology;  Laterality: N/A;   NASAL SEPTOPLASTY W/ TURBINOPLASTY Bilateral 11/18/2016   Procedure: NASAL SEPTOPLASTY WITH BILATERAL TURBINATE REDUCTION;  Surgeon: Jerrell Belfast, MD;  Location: [place];  Service: ENT;  Laterality: Bilateral;   NONINVASIVE VASCULAR CAROTID STUDY  09/14/2007   BILATERAL MILD MIX PLAQUE THROUGHOUT, NO SIGNIFICANT BILATERAL ICA STENOSIS   PUBOVAGINAL SLING  12/11/2011   Procedure: Gaynelle Arabian;  Surgeon: Claybon Jabs, MD;  Location: [facility];  Service: Urology;  Laterality: N/A;   PULLEY RELEASE RIGHT THUMB  2009   SHOULDER ARTHROSCOPY DISTAL CLAVICLE EXCISION AND OPEN ROTATOR CUFF REPAIR Right 01-17-2019   dr supple '@SCG'$    SINUS ENDO WITH FUSION Bilateral 11/18/2016   Procedure: BILATERAL ENDOSCOPIC SINUS SURGERY;  Surgeon: Jerrell Belfast, MD;  Location: [facility];  Service: ENT;  Laterality: Bilateral;   UMBILICAL HERNIA REPAIR N/A 03/25/2021   Procedure: Primary REPAIR UMBILICAL HERNIA;  Surgeon: Rolm Bookbinder, MD;  Location: [facility];  Service: General;  Laterality:  N/A;   UPPER GASTROINTESTINAL ENDOSCOPY     VAGINAL HYSTERECTOMY  AGE 22    OB History   No obstetric history on file.      Home Medications    Prior to Admission medications   Medication Sig Start Date End Date Taking? Authorizing Provider  acetaminophen (TYLENOL) 500 MG tablet Take 500-1,000 mg by mouth every 6 (six) hours as needed for mild pain or headache.   Yes [provider]  albuterol (VENTOLIN HFA) 108 (90 Base) MCG/ACT inhaler Inhale 1-2 puffs into the lungs every 6 (six) hours as needed for wheezing or shortness of breath. 05/30/22  Yes Sharion Balloon, NP  azithromycin (ZITHROMAX) 250 MG tablet Take 1 tablet (250 mg total) by mouth daily. Take first 2 tablets together, then 1 every day until finished.  10/27/22  Yes Sharion Balloon, NP  citalopram (CELEXA) 20 MG tablet Take 10 mg by mouth at bedtime.   Yes [provider]  cyanocobalamin (VITAMIN B12) 1000 MCG tablet Take 1 tablet (1,000 mcg total) by mouth daily. 07/07/22 01/03/23 Yes Alric Ran, MD  dicyclomine (BENTYL) 10 MG capsule Take 1 capsule (10 mg total) by mouth 4 (four) times daily as needed for spasms. 11/17/21  Yes Gatha Mayer, MD  estradiol (ESTRACE) 0.1 MG/GM vaginal cream Apply a pea-sized amount to fingertip and wipe in vaginal introitus twice weekly 08/09/22  Yes Stoioff, Ronda Fairly, MD  famotidine (PEPCID) 40 MG tablet Take 40 mg by mouth at bedtime.   Yes [provider]  fexofenadine (ALLEGRA) 180 MG tablet Take 180 mg by mouth daily.   Yes [provider]  losartan (COZAAR) 25 MG tablet Take 25 mg by mouth daily.   Yes [provider]  methocarbamol (ROBAXIN) 500 MG tablet Take 500 mg by mouth every 6 (six) hours as needed for muscle spasms.   Yes [provider]  predniSONE (DELTASONE) 10 MG tablet Take 4 tablets (40 mg total) by mouth daily for 5 days. 10/27/22 11/01/22 Yes Sharion Balloon, NP  traZODone (DESYREL) 50 MG tablet Take 25 mg by mouth at bedtime.   Yes [provider]  cyclobenzaprine (FLEXERIL) 10 MG tablet Take 1 tablet (10 mg total) by mouth 3 (three) times daily as needed for muscle spasms. Patient taking differently: Take 10 mg by mouth at bedtime as needed for muscle spasms. 10/25/17   Newman Pies, MD  hydrochlorothiazide (HYDRODIURIL) 25 MG tablet Take 1 tablet (25 mg total) by mouth daily. 05/30/19   Minna Merritts, MD  HYDROcodone-acetaminophen (NORCO/VICODIN) 5-325 MG tablet Take 1 tablet by mouth every 6 (six) hours as needed for severe pain. 08/27/21   [provider]  HYDROcodone-acetaminophen (NORCO/VICODIN) 5-325 MG tablet Take 1 tablet by mouth every 4 (four) hours as needed for moderate pain or severe pain. 12/24/21   Rolm Bookbinder, MD  ondansetron (ZOFRAN-ODT) 4 MG disintegrating tablet Take 1 tablet (4 mg total) by mouth every 6 (six) hours as needed. 10/06/22   Gatha Mayer, MD  pantoprazole (PROTONIX) 40 MG tablet Take 1 tablet (40 mg total) by mouth 2 (two) times daily. 11/17/21   Gatha Mayer, MD  potassium chloride (MICRO-K) 10 MEQ CR capsule Take 20 mEq by mouth daily. 03/23/15   [provider]  promethazine (PHENERGAN) 25 MG tablet Take 1 tablet (25 mg total) by mouth every 6 (six) hours as needed for nausea or vomiting. 10/06/22   Gatha Mayer, MD  traMADol Veatrice Bourbon) 50  MG tablet Take 50 mg by mouth every 6 (six) hours as needed for moderate pain.    [provider]    Family History Family History  Adopted: Yes  Problem Relation Age of Onset   Diabetes Mother    Stroke Mother    Alzheimer's disease Mother    Hypertension Sister    Diabetes Sister    Heart attack Brother    Leukemia Brother    CAD Brother    Diabetes Brother    Colon cancer Maternal Aunt    Stroke Paternal Grandmother    Heart attack Paternal Grandfather    Esophageal cancer Neg Hx    Rectal cancer Neg Hx    Stomach cancer Neg Hx     Social History Social History   Tobacco Use   Smoking status: Never   Smokeless tobacco: Never  Vaping Use   Vaping Use: Never used  Substance Use Topics   Alcohol use: No   Drug use: No     Allergies   Nsaids, Other, Hydroxyzine hcl, Promethazine hcl, Sulfamethoxazole-trimethoprim, Zolpidem, Zolpidem tartrate, Lactose intolerance (gi), Penicillins, and Septra [sulfamethoxazole-trimethoprim]   Review of Systems Review of Systems  Constitutional:  Negative for chills and fever.  HENT:  Positive for ear pain and sore throat. Negative for voice change.   Respiratory:  Positive for cough. Negative for shortness of breath and wheezing.   Cardiovascular:  Negative for chest pain and palpitations.  Gastrointestinal:  Negative for diarrhea and vomiting.  Skin:   Negative for color change and rash.  All other systems reviewed and are negative.    Physical Exam Triage Vital Signs ED Triage Vitals [10/27/22 1208]  Enc Vitals Group     BP      Pulse Rate 81     Resp 18     Temp 97.9 F (36.6 C)     Temp src      SpO2 95 %     Weight      Height      Head Circumference      Peak Flow      Pain Score      Pain Loc      Pain Edu?      Excl. in [place]?    No data found.  Updated Vital Signs BP (!) 144/75   Pulse 81   Temp 97.9 F (36.6 C)   Resp 18   SpO2 95%   Visual Acuity Right Eye Distance:   Left Eye Distance:   Bilateral Distance:    Right Eye Near:   Left Eye Near:    Bilateral Near:     Physical Exam Vitals and nursing note reviewed.  Constitutional:      General: She is not in acute distress.    Appearance: Normal appearance. She is well-developed. She is not ill-appearing.  HENT:     Right Ear: Tympanic membrane normal.     Left Ear: Tympanic membrane normal.     Nose: Nose normal.     Mouth/Throat:     Mouth: Mucous membranes are moist.     Pharynx: Oropharynx is clear.  Cardiovascular:     Rate and Rhythm: Normal rate and regular rhythm.     Heart sounds: Normal heart sounds.  Pulmonary:     Effort: Pulmonary effort is normal. No respiratory distress.     Breath sounds: Normal breath sounds. No wheezing.  Musculoskeletal:     Cervical back: Neck supple.  Skin:  General: Skin is warm and dry.  Neurological:     Mental Status: She is alert.  Psychiatric:        Mood and Affect: Mood normal.        Behavior: Behavior normal.      UC Treatments / Results  Labs (all labs ordered are listed, but only abnormal results are displayed) Labs Reviewed - No data to display  EKG   Radiology No results found.  Procedures Procedures (including critical care time)  Medications Ordered in UC Medications - No data to display  Initial Impression / Assessment and Plan / UC Course  I have reviewed the  triage vital signs and the nursing notes.  Pertinent labs & imaging results that were available during my care of the patient were reviewed by me and considered in my medical decision making (see chart for details).    Acute bronchitis and sinusitis.  Patient has been symptomatic for 5 days and is getting worse.  She denies wheezing or shortness of breath but has needed to use her albuterol.  Her lungs are clear at this time and O2 sat is 95% on room air.  Treating today with Zithromax and prednisone.  Instructed her to continue using her albuterol as directed.  Instructed her to schedule a follow-up appointment with her PCP.  ED precautions discussed.  Education provided on bronchitis and sinusitis.  Patient agrees to plan of care.  Final Clinical Impressions(s) / UC Diagnoses   Final diagnoses:  Acute bronchitis, unspecified organism  Acute non-recurrent maxillary sinusitis     Discharge Instructions      Continue using your albuterol.  Take the prednisone and Zithromax as directed.  Follow up with your primary care provider.        ED Prescriptions     Medication Sig Dispense Auth. Provider   predniSONE (DELTASONE) 10 MG tablet Take 4 tablets (40 mg total) by mouth daily for 5 days. 20 tablet Sharion Balloon, NP   azithromycin (ZITHROMAX) 250 MG tablet Take 1 tablet (250 mg total) by mouth daily. Take first 2 tablets together, then 1 every day until finished. 6 tablet Sharion Balloon, NP      PDMP not reviewed this encounter.   Sharion Balloon, NP 10/27/22 [phone]

## 2022-11-04 DIAGNOSIS — M7541 Impingement syndrome of right shoulder: Secondary | ICD-10-CM | POA: Diagnosis not present

## 2022-11-05 ENCOUNTER — Encounter: Payer: Self-pay | Admitting: Urology

## 2022-11-05 ENCOUNTER — Ambulatory Visit (INDEPENDENT_AMBULATORY_CARE_PROVIDER_SITE_OTHER): Payer: Medicare Other | Admitting: Urology

## 2022-11-05 VITALS — BP 135/74 | HR 76 | Ht 64.0 in | Wt 174.0 lb

## 2022-11-05 DIAGNOSIS — R8281 Pyuria: Secondary | ICD-10-CM

## 2022-11-05 DIAGNOSIS — R339 Retention of urine, unspecified: Secondary | ICD-10-CM

## 2022-11-05 DIAGNOSIS — R8271 Bacteriuria: Secondary | ICD-10-CM | POA: Diagnosis not present

## 2022-11-05 DIAGNOSIS — N390 Urinary tract infection, site not specified: Secondary | ICD-10-CM

## 2022-11-05 LAB — MICROSCOPIC EXAMINATION

## 2022-11-05 LAB — URINALYSIS, COMPLETE
Bilirubin, UA: NEGATIVE
Glucose, UA: NEGATIVE
Ketones, UA: NEGATIVE
Nitrite, UA: NEGATIVE
Protein,UA: NEGATIVE
RBC, UA: NEGATIVE
Specific Gravity, UA: 1.01 (ref 1.005–1.030)
Urobilinogen, Ur: 0.2 mg/dL (ref 0.2–1.0)
pH, UA: 7 (ref 5.0–7.5)

## 2022-11-05 LAB — BLADDER SCAN AMB NON-IMAGING: Scan Result: 15

## 2022-11-05 NOTE — Progress Notes (Signed)
11/05/2022 10:43 AM   Amy Vang 03-Dec-1945 [id number]  Referring provider: Carol Ada, MD [place],  [place] [address]  Chief Complaint  Patient presents with   Other    HPI: 76 y.o. female presents for 55-monthfollow-up  Refer to initial office note 08/06/2022 for details She feels she is emptying her bladder better and has had no UTIs or symptoms since last visit Has not started Estrace cream as she was unclear whether this went internally or externally   PMH: Past Medical History:  Diagnosis Date   Allergic rhinitis    Allergy    seasonal   Anemia    Anxiety    Cataract    removed bilateraly   Chronic constipation    COVID-19 08/2019   Degenerative disc disease, cervical    Essential hypertension    GERD (gastroesophageal reflux disease)    H/O bronchitis    Hiatal hernia    History of echocardiogram    in epic 04-21-2019,  normal w/ ef 60-65% and mild mitral regurg, no stenosis or restriction   History of TIAs    per pt age 76  2008;  05/ 2013;  pt stated no residual was from sleep doctor more than likely sleep deprived than actual tia   Motion sickness    cars   Nocturia    OSA on CPAP    Severe ,, CPAP 12 MM H2O- Dr TRadford Pax  PONV (postoperative nausea and vomiting)    Restless leg syndrome    Rosacea    Seasonal asthma    no inhaler   Sleep apnea    uses CPAP    Surgical History: Past Surgical History:  Procedure Laterality Date   ANTERIOR CERVICAL DECOMP/DISCECTOMY FUSION N/A 10/25/2017   Procedure: CERVICAL SIX- CERVICAL SEVEN ANTERIOR CERVICAL DECOMPRESSION/DISCECTOMY FUSION, INTERBODY PROSTHESIS AND ANTERIOR PLATING;  Surgeon: JNewman Pies MD;  [facility]  Service: Neurosurgery;  Laterality: N/A;  CERVICAL 6- CERVICAL 7 ANTERIOR CERVICAL DECOMPRESSION/DISCECTOMY FUSION, INTERBODY PROSTHESIS AND ANTERIOR PLATING   BACK SURGERY     BILATERAL BENIGN BREAST BX'S  20 YRS AGO   BREAST BIOPSY Right     benign   BREAST EXCISIONAL BIOPSY Bilateral over 10 years ago   benign   CARDIAC CATHETERIZATION N/A 08/13/2016   Procedure: Right Heart Cath;  Surgeon: DLarey Dresser MD;  [facility];  Service: Cardiovascular;  Laterality: N/A;    normal study, no evidence pulm HTN   CARPAL TUNNEL RELEASE  2009   BILATERAL   CATARACT EXTRACTION W/PHACO Right 02/27/2020   Procedure: CATARACT EXTRACTION PHACO AND INTRAOCULAR LENS PLACEMENT (IOC) RIGHT 3.60 00:23.9;  Surgeon: PBirder Robson MD;  [place] [facility]  Service: Ophthalmology;  Laterality: Right;  sleep apnea   CATARACT EXTRACTION W/PHACO Left 05/21/2020   Procedure: CATARACT EXTRACTION PHACO AND INTRAOCULAR LENS PLACEMENT (IOC) LEFT 3.69 00:30.9;  Surgeon: PBirder Robson MD;  [place] [facility]  Service: Ophthalmology;  Laterality: Left;  sleep apnea   CHOLECYSTECTOMY N/A 03/25/2021   Procedure: LAPAROSCOPIC CHOLECYSTECTOMY;  Surgeon: WRolm Bookbinder MD;  [facility]  Service: General;  Laterality: N/A;   CYSTOCELE REPAIR  12/11/2011   Procedure: ANTERIOR REPAIR (CYSTOCELE);  Surgeon: MClaybon Jabs MD;  [facility]  Service: Urology;  Laterality: N/A;  1 1/2 hour requested for case  Anterior repair and mid Urethral Sling   INCISIONAL HERNIA REPAIR N/A 12/24/2021   Procedure: LAP  ASSISTED INCISIONAL HERNIA REPAIR WITH MESH;  Surgeon: Rolm Bookbinder, MD;  Location: [place];  Service: General;  Laterality: N/A;  GEN & TAP BLOCK RNFA   MASS EXCISION N/A 07/24/2019   Procedure: EXCISION OF VAGINAL FOREIGN BODY;  Surgeon: Kathie Rhodes, MD;  Location: [place];  Service: Urology;  Laterality: N/A;   NASAL SEPTOPLASTY W/ TURBINOPLASTY Bilateral 11/18/2016   Procedure: NASAL SEPTOPLASTY WITH BILATERAL TURBINATE REDUCTION;  Surgeon: Jerrell Belfast, MD;  Location: [place];  Service: ENT;  Laterality: Bilateral;   NONINVASIVE VASCULAR CAROTID STUDY   09/14/2007   BILATERAL MILD MIX PLAQUE THROUGHOUT, NO SIGNIFICANT BILATERAL ICA STENOSIS   PUBOVAGINAL SLING  12/11/2011   Procedure: Gaynelle Arabian;  Surgeon: Claybon Jabs, MD;  Location: [facility];  Service: Urology;  Laterality: N/A;   PULLEY RELEASE RIGHT THUMB  2009   SHOULDER ARTHROSCOPY DISTAL CLAVICLE EXCISION AND OPEN ROTATOR CUFF REPAIR Right 01-17-2019   dr supple '@SCG'$    SINUS ENDO WITH FUSION Bilateral 11/18/2016   Procedure: BILATERAL ENDOSCOPIC SINUS SURGERY;  Surgeon: Jerrell Belfast, MD;  Location: [place];  Service: ENT;  Laterality: Bilateral;   UMBILICAL HERNIA REPAIR N/A 03/25/2021   Procedure: Primary REPAIR UMBILICAL HERNIA;  Surgeon: Rolm Bookbinder, MD;  Location: [place];  Service: General;  Laterality: N/A;   UPPER GASTROINTESTINAL ENDOSCOPY     VAGINAL HYSTERECTOMY  AGE 81    Home Medications:  Allergies as of 11/05/2022       Reactions   Nsaids     Abdominal Pain   Other Other (See Comments)   Hydroxyzine Hcl    UNSPECIFIED REACTION    Promethazine Hcl    hyperactivity   Sulfamethoxazole-trimethoprim Other (See Comments)   Zolpidem    groggy   Zolpidem Tartrate Other (See Comments)   Lactose Intolerance (gi) Other (See Comments)   bloating   Penicillins Hives, Rash, Other (See Comments)   Patient has had a PCN reaction with immediate rash, facial/tongue/throat swelling, SOB, and or lightheadedness with hypotension; Has also developed severe rash involving mucus membranes or skin necrosis.  Has patient had a PCN reaction that required hospitalization No Has patient had a PCN reaction occurring within the last 10 years: No If all of the above answers are "NO", then may proceed with Cephalosporin use.   Septra [sulfamethoxazole-trimethoprim] Rash        Medication List        Accurate as of November 05, 2022 10:43 AM. If you have any questions, ask your nurse or doctor.          STOP taking these medications     azithromycin 250 MG tablet Commonly known as: ZITHROMAX Stopped by: Abbie Sons, MD   HYDROcodone-acetaminophen 5-325 MG tablet Commonly known as: NORCO/VICODIN Stopped by: Abbie Sons, MD       TAKE these medications    acetaminophen 500 MG tablet Commonly known as: TYLENOL Take 500-1,000 mg by mouth every 6 (six) hours as needed for mild pain or headache.   albuterol 108 (90 Base) MCG/ACT inhaler Commonly known as: VENTOLIN HFA Inhale 1-2 puffs into the lungs every 6 (six) hours as needed for wheezing or shortness of breath.   citalopram 20 MG tablet Commonly known as: CELEXA Take 10 mg by mouth at bedtime.   cyanocobalamin 1000 MCG tablet Commonly known as: VITAMIN B12 Take 1 tablet (1,000 mcg total) by mouth daily.   cyclobenzaprine 10 MG tablet Commonly known as: FLEXERIL Take 1 tablet (  10 mg total) by mouth 3 (three) times daily as needed for muscle spasms. What changed: when to take this   dicyclomine 10 MG capsule Commonly known as: BENTYL Take 1 capsule (10 mg total) by mouth 4 (four) times daily as needed for spasms.   estradiol 0.1 MG/GM vaginal cream Commonly known as: ESTRACE Apply a pea-sized amount to fingertip and wipe in vaginal introitus twice weekly   famotidine 40 MG tablet Commonly known as: PEPCID Take 40 mg by mouth at bedtime.   fexofenadine 180 MG tablet Commonly known as: ALLEGRA Take 180 mg by mouth daily.   hydrochlorothiazide 25 MG tablet Commonly known as: HYDRODIURIL Take 1 tablet (25 mg total) by mouth daily.   losartan 25 MG tablet Commonly known as: COZAAR Take 25 mg by mouth daily.   methocarbamol 500 MG tablet Commonly known as: ROBAXIN Take 500 mg by mouth every 6 (six) hours as needed for muscle spasms.   ondansetron 4 MG disintegrating tablet Commonly known as: ZOFRAN-ODT TAKE 1 TABLET BY MOUTH EVERY 6 HOURS AS NEEDED   pantoprazole 40 MG tablet Commonly known as: PROTONIX Take 1 tablet (40 mg  total) by mouth 2 (two) times daily.   potassium chloride 10 MEQ CR capsule Commonly known as: MICRO-K Take 20 mEq by mouth daily.   promethazine 25 MG tablet Commonly known as: PHENERGAN TAKE 1 TABLET BY MOUTH EVERY 6 HOURS AS NEEDED FOR NAUSEA OR VOMITING.   traMADol 50 MG tablet Commonly known as: ULTRAM Take 50 mg by mouth every 6 (six) hours as needed for moderate pain.   traZODone 50 MG tablet Commonly known as: DESYREL Take 25 mg by mouth at bedtime.        Allergies:  Allergies  Allergen Reactions   Nsaids      Abdominal Pain   Other Other (See Comments)   Hydroxyzine Hcl     UNSPECIFIED REACTION    Promethazine Hcl     hyperactivity   Sulfamethoxazole-Trimethoprim Other (See Comments)   Zolpidem     groggy   Zolpidem Tartrate Other (See Comments)   Lactose Intolerance (Gi) Other (See Comments)    bloating   Penicillins Hives, Rash and Other (See Comments)    Patient has had a PCN reaction with immediate rash, facial/tongue/throat swelling, SOB, and or lightheadedness with hypotension; Has also developed severe rash involving mucus membranes or skin necrosis.   Has patient had a PCN reaction that required hospitalization No Has patient had a PCN reaction occurring within the last 10 years: No If all of the above answers are "NO", then may proceed with Cephalosporin use.    Septra [Sulfamethoxazole-Trimethoprim] Rash    Family History: Family History  Adopted: Yes  Problem Relation Age of Onset   Diabetes Mother    Stroke Mother    Alzheimer's disease Mother    Hypertension Sister    Diabetes Sister    Heart attack Brother    Leukemia Brother    CAD Brother    Diabetes Brother    Colon cancer Maternal Aunt    Stroke Paternal Grandmother    Heart attack Paternal Grandfather    Esophageal cancer Neg Hx    Rectal cancer Neg Hx    Stomach cancer Neg Hx     Social History:  reports that she has never smoked. She has never used smokeless tobacco.  She reports that she does not drink alcohol and does not use drugs.   Physical Exam: BP 135/74  Pulse 76   Ht '5\' 4"'$  (1.626 m)   Wt 174 lb (78.9 kg)   BMI 29.87 kg/m   Constitutional:  Alert and oriented, No acute distress. HEENT: [place] AT Respiratory: Normal respiratory effort, no increased work of breathing. Psychiatric: Normal mood and affect.  Laboratory Data:  Urinalysis Microscopy 11-30 WBC/many bacteria   Assessment & Plan:    Recurrent UTI Reviewed application of Estrace UA today with moderate pyuria consistent with asymptomatic bacteriuria and no treatment recommended  2.  Incomplete bladder emptying PVR today was 15 mL  Follow-up 1 year or earlier for recurrent symptoms   Abbie Sons, MD  [facility] [address], [place],  [address] [phone]

## 2022-11-08 ENCOUNTER — Encounter: Payer: Self-pay | Admitting: Urology

## 2022-11-12 ENCOUNTER — Telehealth: Payer: Self-pay | Admitting: Urology

## 2022-11-12 NOTE — Telephone Encounter (Signed)
error 

## 2022-11-12 NOTE — Telephone Encounter (Signed)
Pt called and would like to discuss the results of her UA.  [phone]  Thank you

## 2022-11-16 ENCOUNTER — Ambulatory Visit
Admission: EM | Admit: 2022-11-16 | Discharge: 2022-11-16 | Disposition: A | Discharge status: Home / Self Care | Payer: Medicare Other | Attending: Urgent Care | Admitting: Urgent Care

## 2022-11-16 DIAGNOSIS — B9689 Other specified bacterial agents as the cause of diseases classified elsewhere: Secondary | ICD-10-CM | POA: Diagnosis not present

## 2022-11-16 DIAGNOSIS — J019 Acute sinusitis, unspecified: Secondary | ICD-10-CM | POA: Diagnosis not present

## 2022-11-16 MED ORDER — AMOXICILLIN-POT CLAVULANATE 875-125 MG PO TABS: 1.0000 | ORAL_TABLET | Freq: Two times a day (BID) | ORAL | 0 refills | Status: AC

## 2022-11-16 MED ORDER — DOXYCYCLINE HYCLATE 100 MG PO CAPS: 100.0000 mg | ORAL_CAPSULE | Freq: Two times a day (BID) | ORAL | 0 refills | Status: DC

## 2022-11-16 NOTE — Discharge Instructions (Addendum)
Follow up here or with your primary care provider if your symptoms are worsening or not improving with treatment.     

## 2022-11-16 NOTE — ED Provider Notes (Addendum)
[facility] Amy Vang    CSN: [id number] Arrival date & time: 11/16/22  1130      History   Chief Complaint Chief Complaint  Patient presents with   Cough   Generalized Body Aches    HPI Amy Vang is a 76 y.o. female.    Cough   Presents to urgent care with complaint of cough and bodyaches x 10 days.  She endorses symptoms starting with "flulike symptoms" including fever, body aches, chills and then developing into cough.  She states her symptoms improved significantly and then "moved into my head" a few days ago.  She endorses low-grade fever (99.2 F measured today) and continued body aches along with sinus pain and pressure.  Past Medical History:  Diagnosis Date   Allergic rhinitis    Allergy    seasonal   Anemia    Anxiety    Cataract    removed bilateraly   Chronic constipation    COVID-19 08/2019   Degenerative disc disease, cervical    Essential hypertension    GERD (gastroesophageal reflux disease)    H/O bronchitis    Hiatal hernia    History of echocardiogram    in epic 04-21-2019,  normal w/ ef 60-65% and mild mitral regurg, no stenosis or restriction   History of TIAs    per pt age 85;  2008;  05/ 2013;  pt stated no residual was from sleep doctor more than likely sleep deprived than actual tia   Motion sickness    cars   Nocturia    OSA on CPAP    Severe ,, CPAP 12 MM H2O- Dr Radford Pax   PONV (postoperative nausea and vomiting)    Restless leg syndrome    Rosacea    Seasonal asthma    no inhaler   Sleep apnea    uses CPAP    Patient Active Problem List   Diagnosis Date Noted   Abnormal vaginal bleeding 12/17/2021   Allergic rhinitis 12/17/2021   Adjustment disorder 12/17/2021   Cholelithiasis without obstruction 12/17/2021   Constipation 12/17/2021   Degeneration of lumbar intervertebral disc 12/17/2021   Diverticulosis of sigmoid colon 12/17/2021   Fatty liver 12/17/2021   Functional abdominal pain syndrome 12/17/2021    Gastro-esophageal reflux disease without esophagitis 12/17/2021   Hypo-osmolality and hyponatremia 12/17/2021   Internal hemorrhoids 12/17/2021   Irritable bowel syndrome without diarrhea 12/17/2021   Lymphocytosis 12/17/2021   Nausea and vomiting 12/17/2021   Overactive bladder 12/17/2021   Personal history of colonic polyps 12/17/2021   Personal history of transient ischemic attack (TIA), and cerebral infarction without residual deficits 12/17/2021   Insomnia 12/17/2021   Pure hypercholesterolemia 12/17/2021   Restless legs syndrome 12/17/2021   Rosacea 12/17/2021   Sleep-wake schedule disorder, 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   Piriformis syndrome of right side 01/01/2021   Lower limb pain, inferior, right 09/19/2020   Lumbosacral radiculitis 09/19/2020   Body mass index (BMI) 25.0-25.9, adult 06/17/2020   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   Annular tear of lumbar disc 01/24/2020   Acute back pain with sciatica 12/28/2019   Encounter for orthopedic follow-up care 01/17/2019   Partial thickness rotator cuff tear 11/07/2018   Acquired trigger finger 10/28/2018   Pain in joint of right shoulder 05/06/2018   Hoarseness 12/22/2017   Cervical spondylosis with radiculopathy 10/25/2017   Neck pain 08/31/2017   Other cervical disc displacement, unspecified cervical region 08/31/2017   Sinusitis, chronic 11/18/2016   Deviated septum 09/18/2016  Dyspnea and respiratory abnormality 05/21/2016   Wheeze 05/21/2016   Chronic throat clearing 05/21/2016   Pulmonary HTN ([place]) 04/23/2016   Moderate persistent asthma 11/12/2015   Allergic rhinoconjunctivitis 11/12/2015   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   SOB (shortness of breath) 03/06/2015   OSA (obstructive sleep apnea) 01/01/2014   Benign essential HTN 01/01/2014   Obesity 01/01/2014    Past Surgical History:  Procedure Laterality Date   ANTERIOR CERVICAL DECOMP/DISCECTOMY FUSION N/A 10/25/2017   Procedure: CERVICAL SIX- CERVICAL SEVEN  ANTERIOR CERVICAL DECOMPRESSION/DISCECTOMY FUSION, INTERBODY PROSTHESIS AND ANTERIOR PLATING;  Surgeon: Newman Pies, MD;  Location: [place];  Service: Neurosurgery;  Laterality: N/A;  CERVICAL 6- CERVICAL 7 ANTERIOR CERVICAL DECOMPRESSION/DISCECTOMY FUSION, INTERBODY PROSTHESIS AND ANTERIOR PLATING   BACK SURGERY     BILATERAL BENIGN BREAST BX'S  20 YRS AGO   BREAST BIOPSY Right    benign   BREAST EXCISIONAL BIOPSY Bilateral over 10 years ago   benign   CARDIAC CATHETERIZATION N/A 08/13/2016   Procedure: Right Heart Cath;  Surgeon: Larey Dresser, MD;  Location: [facility];  Service: Cardiovascular;  Laterality: N/A;    normal study, no evidence pulm HTN   CARPAL TUNNEL RELEASE  2009   BILATERAL   CATARACT EXTRACTION W/PHACO Right 02/27/2020   Procedure: CATARACT EXTRACTION PHACO AND INTRAOCULAR LENS PLACEMENT (IOC) RIGHT 3.60 00:23.9;  Surgeon: Birder Robson, MD;  Location: [place];  Service: Ophthalmology;  Laterality: Right;  sleep apnea   CATARACT EXTRACTION W/PHACO Left 05/21/2020   Procedure: CATARACT EXTRACTION PHACO AND INTRAOCULAR LENS PLACEMENT (IOC) LEFT 3.69 00:30.9;  Surgeon: Birder Robson, MD;  Location: [place];  Service: Ophthalmology;  Laterality: Left;  sleep apnea   CHOLECYSTECTOMY N/A 03/25/2021   Procedure: LAPAROSCOPIC CHOLECYSTECTOMY;  Surgeon: Rolm Bookbinder, MD;  Location: [place];  Service: General;  Laterality: N/A;   CYSTOCELE REPAIR  12/11/2011   Procedure: ANTERIOR REPAIR (CYSTOCELE);  Surgeon: Claybon Jabs, MD;  [facility];  Service: Urology;  Laterality: N/A;  1 1/2 hour requested for case  Anterior repair and mid Urethral Sling   INCISIONAL HERNIA REPAIR N/A 12/24/2021   Procedure: LAP ASSISTED INCISIONAL HERNIA REPAIR WITH MESH;  Surgeon: Rolm Bookbinder, MD;  Location: [place];  Service: General;  Laterality: N/A;  GEN & TAP BLOCK RNFA   MASS EXCISION N/A 07/24/2019   Procedure:  EXCISION OF VAGINAL FOREIGN BODY;  Surgeon: Kathie Rhodes, MD;  Location: [facility];  Service: Urology;  Laterality: N/A;   NASAL SEPTOPLASTY W/ TURBINOPLASTY Bilateral 11/18/2016   Procedure: NASAL SEPTOPLASTY WITH BILATERAL TURBINATE REDUCTION;  Surgeon: Jerrell Belfast, MD;  Location: [place];  Service: ENT;  Laterality: Bilateral;   NONINVASIVE VASCULAR CAROTID STUDY  09/14/2007   BILATERAL MILD MIX PLAQUE THROUGHOUT, NO SIGNIFICANT BILATERAL ICA STENOSIS   PUBOVAGINAL SLING  12/11/2011   Procedure: Gaynelle Arabian;  Surgeon: Claybon Jabs, MD;  Location: [facility];  Service: Urology;  Laterality: N/A;   PULLEY RELEASE RIGHT THUMB  2009   SHOULDER ARTHROSCOPY DISTAL CLAVICLE EXCISION AND OPEN ROTATOR CUFF REPAIR Right 01-17-2019   dr supple '@SCG'$    SINUS ENDO WITH FUSION Bilateral 11/18/2016   Procedure: BILATERAL ENDOSCOPIC SINUS SURGERY;  Surgeon: Jerrell Belfast, MD;  Location: [place];  Service: ENT;  Laterality: Bilateral;   UMBILICAL HERNIA REPAIR N/A 03/25/2021   Procedure: Primary REPAIR UMBILICAL HERNIA;  Surgeon: Rolm Bookbinder, MD;  Location: [place];  Service: General;  Laterality: N/A;  UPPER GASTROINTESTINAL ENDOSCOPY     VAGINAL HYSTERECTOMY  AGE 70    OB History   No obstetric history on file.      Home Medications    Prior to Admission medications   Medication Sig Start Date End Date Taking? Authorizing Provider  acetaminophen (TYLENOL) 500 MG tablet Take 500-1,000 mg by mouth every 6 (six) hours as needed for mild pain or headache.    [provider]  albuterol (VENTOLIN HFA) 108 (90 Base) MCG/ACT inhaler Inhale 1-2 puffs into the lungs every 6 (six) hours as needed for wheezing or shortness of breath. 05/30/22   Sharion Balloon, NP  citalopram (CELEXA) 20 MG tablet Take 10 mg by mouth at bedtime.    [provider]  cyanocobalamin (VITAMIN B12) 1000 MCG tablet Take 1 tablet (1,000 mcg total) by mouth daily. 07/07/22  01/03/23  Alric Ran, MD  cyclobenzaprine (FLEXERIL) 10 MG tablet Take 1 tablet (10 mg total) by mouth 3 (three) times daily as needed for muscle spasms. Patient taking differently: Take 10 mg by mouth at bedtime as needed for muscle spasms. 10/25/17   Newman Pies, MD  dicyclomine (BENTYL) 10 MG capsule Take 1 capsule (10 mg total) by mouth 4 (four) times daily as needed for spasms. 11/17/21   Gatha Mayer, MD  estradiol (ESTRACE) 0.1 MG/GM vaginal cream Apply a pea-sized amount to fingertip and wipe in vaginal introitus twice weekly 08/09/22   Abbie Sons, MD  famotidine (PEPCID) 40 MG tablet Take 40 mg by mouth at bedtime.    [provider]  fexofenadine (ALLEGRA) 180 MG tablet Take 180 mg by mouth daily.    [provider]  hydrochlorothiazide (HYDRODIURIL) 25 MG tablet Take 1 tablet (25 mg total) by mouth daily. 05/30/19   Minna Merritts, MD  losartan (COZAAR) 25 MG tablet Take 25 mg by mouth daily.    [provider]  methocarbamol (ROBAXIN) 500 MG tablet Take 500 mg by mouth every 6 (six) hours as needed for muscle spasms.    [provider]  ondansetron (ZOFRAN-ODT) 4 MG disintegrating tablet TAKE 1 TABLET BY MOUTH EVERY 6 HOURS AS NEEDED 10/28/22   Gatha Mayer, MD  pantoprazole (PROTONIX) 40 MG tablet Take 1 tablet (40 mg total) by mouth 2 (two) times daily. 11/17/21   Gatha Mayer, MD  potassium chloride (MICRO-K) 10 MEQ CR capsule Take 20 mEq by mouth daily. 03/23/15   [provider]  promethazine (PHENERGAN) 25 MG tablet TAKE 1 TABLET BY MOUTH EVERY 6 HOURS AS NEEDED FOR NAUSEA OR VOMITING. 10/28/22   Gatha Mayer, MD  traMADol (ULTRAM) 50 MG tablet Take 50 mg by mouth every 6 (six) hours as needed for moderate pain.    [provider]  traZODone (DESYREL) 50 MG tablet Take 25 mg by mouth at bedtime.    [provider]    Family History Family History  Adopted: Yes  Problem Relation Age of Onset    Diabetes Mother    Stroke Mother    Alzheimer's disease Mother    Hypertension Sister    Diabetes Sister    Heart attack Brother    Leukemia Brother    CAD Brother    Diabetes Brother    Colon cancer Maternal Aunt    Stroke Paternal Grandmother    Heart attack Paternal Grandfather    Esophageal cancer Neg Hx    Rectal cancer Neg Hx    Stomach cancer Neg  Hx     Social History Social History   Tobacco Use   Smoking status: Never   Smokeless tobacco: Never  Vaping Use   Vaping Use: Never used  Substance Use Topics   Alcohol use: No   Drug use: No     Allergies   Nsaids, Other, Hydroxyzine hcl, Promethazine hcl, Sulfamethoxazole-trimethoprim, Zolpidem, Zolpidem tartrate, Lactose intolerance (gi), Penicillins, and Septra [sulfamethoxazole-trimethoprim]   Review of Systems Review of Systems  Respiratory:  Positive for cough.      Physical Exam Triage Vital Signs ED Triage Vitals  Enc Vitals Group     BP 11/16/22 1236 117/74     Pulse Rate 11/16/22 1236 73     Resp 11/16/22 1236 16     Temp 11/16/22 1236 99.2 F (37.3 C)     Temp src --      SpO2 11/16/22 1236 95 %     Weight --      Height --      Head Circumference --      Peak Flow --      Pain Score 11/16/22 1237 0     Pain Loc --      Pain Edu? --      Excl. in [place]? --    No data found.  Updated Vital Signs BP 117/74   Pulse 73   Temp 99.2 F (37.3 C)   Resp 16   SpO2 95%   Visual Acuity Right Eye Distance:   Left Eye Distance:   Bilateral Distance:    Right Eye Near:   Left Eye Near:    Bilateral Near:     Physical Exam Vitals reviewed.  Constitutional:      Appearance: Normal appearance.  HENT:     Nose:     Right Sinus: Maxillary sinus tenderness present.     Left Sinus: Maxillary sinus tenderness present.  Cardiovascular:     Rate and Rhythm: Normal rate and regular rhythm.     Pulses: Normal pulses.     Heart sounds: Normal heart sounds.  Pulmonary:     Effort:  Pulmonary effort is normal.     Breath sounds: Normal breath sounds.  Skin:    General: Skin is warm and dry.  Neurological:     General: No focal deficit present.     Mental Status: She is alert and oriented to person, place, and time.  Psychiatric:        Mood and Affect: Mood normal.        Behavior: Behavior normal.      UC Treatments / Results  Labs (all labs ordered are listed, but only abnormal results are displayed) Labs Reviewed - No data to display  EKG   Radiology No results found.  Procedures Procedures (including critical care time)  Medications Ordered in UC Medications - No data to display  Initial Impression / Assessment and Plan / UC Course  I have reviewed the triage vital signs and the nursing notes.  Pertinent labs & imaging results that were available during my care of the patient were reviewed by me and considered in my medical decision making (see chart for details).   Patient is afebrile here without recent antipyretics. Satting well on room air. Overall is ill appearing, well hydrated, without respiratory distress. Pulmonary exam is unremarkable.  She has bilateral maxillary sinus tenderness with palpation.  Suspect acute bacterial sinusitis secondary to past viral URI, possibly influenza.  Her chart indicates an PCN allergy  which the patient denies.  She states that she has used amoxicillin without issue recently.  The allergy is from when she was 76 years old and had a rash.  Initially prescribed Doxy but represcribed Augmentin after this discussion with patient.  Final Clinical Impressions(s) / UC Diagnoses   Final diagnoses:  None   Discharge Instructions   None    ED Prescriptions   None    PDMP not reviewed this encounter.   Rose Phi, FNP 11/16/22 1253    Rose Phi, FNP 11/16/22 1259    Rose Phi, FNP 11/16/22 1301

## 2022-11-16 NOTE — ED Triage Notes (Signed)
Pt. Presents to UC w/ c/o a cough and body aches for the past 10 days.

## 2022-11-17 ENCOUNTER — Ambulatory Visit: Payer: Medicare Other

## 2022-11-18 DIAGNOSIS — J0100 Acute maxillary sinusitis, unspecified: Secondary | ICD-10-CM | POA: Diagnosis not present

## 2022-11-18 DIAGNOSIS — R051 Acute cough: Secondary | ICD-10-CM | POA: Diagnosis not present

## 2022-11-30 DIAGNOSIS — C50919 Malignant neoplasm of unspecified site of unspecified female breast: Secondary | ICD-10-CM

## 2022-11-30 HISTORY — DX: Malignant neoplasm of unspecified site of unspecified female breast: C50.919

## 2022-12-05 ENCOUNTER — Other Ambulatory Visit: Payer: Self-pay | Admitting: Internal Medicine

## 2022-12-05 DIAGNOSIS — K219 Gastro-esophageal reflux disease without esophagitis: Secondary | ICD-10-CM

## 2022-12-16 ENCOUNTER — Other Ambulatory Visit: Payer: Self-pay | Admitting: Internal Medicine

## 2022-12-16 DIAGNOSIS — K219 Gastro-esophageal reflux disease without esophagitis: Secondary | ICD-10-CM

## 2022-12-21 DIAGNOSIS — U071 COVID-19: Secondary | ICD-10-CM | POA: Diagnosis not present

## 2022-12-27 DIAGNOSIS — G4733 Obstructive sleep apnea (adult) (pediatric): Secondary | ICD-10-CM | POA: Diagnosis not present

## 2023-01-01 ENCOUNTER — Other Ambulatory Visit: Payer: Self-pay | Admitting: Family Medicine

## 2023-01-01 DIAGNOSIS — Z1231 Encounter for screening mammogram for malignant neoplasm of breast: Secondary | ICD-10-CM

## 2023-01-04 DIAGNOSIS — M3501 Sicca syndrome with keratoconjunctivitis: Secondary | ICD-10-CM | POA: Diagnosis not present

## 2023-01-04 DIAGNOSIS — H26492 Other secondary cataract, left eye: Secondary | ICD-10-CM | POA: Diagnosis not present

## 2023-01-05 ENCOUNTER — Ambulatory Visit
Admission: RE | Admit: 2023-01-05 | Discharge: 2023-01-05 | Disposition: A | Discharge status: Home / Self Care | Payer: HMO | Source: Ambulatory Visit | Attending: Family Medicine | Admitting: Family Medicine

## 2023-01-05 DIAGNOSIS — Z1231 Encounter for screening mammogram for malignant neoplasm of breast: Secondary | ICD-10-CM | POA: Diagnosis not present

## 2023-01-06 DIAGNOSIS — Z8709 Personal history of other diseases of the respiratory system: Secondary | ICD-10-CM | POA: Diagnosis not present

## 2023-01-06 DIAGNOSIS — R053 Chronic cough: Secondary | ICD-10-CM | POA: Diagnosis not present

## 2023-01-06 DIAGNOSIS — U099 Post covid-19 condition, unspecified: Secondary | ICD-10-CM | POA: Diagnosis not present

## 2023-01-08 ENCOUNTER — Other Ambulatory Visit: Payer: Self-pay | Admitting: Family Medicine

## 2023-01-08 DIAGNOSIS — R928 Other abnormal and inconclusive findings on diagnostic imaging of breast: Secondary | ICD-10-CM

## 2023-01-13 ENCOUNTER — Other Ambulatory Visit: Payer: Self-pay | Admitting: Internal Medicine

## 2023-01-13 DIAGNOSIS — K219 Gastro-esophageal reflux disease without esophagitis: Secondary | ICD-10-CM

## 2023-01-19 ENCOUNTER — Other Ambulatory Visit: Payer: Self-pay | Admitting: Family Medicine

## 2023-01-19 ENCOUNTER — Ambulatory Visit
Admission: RE | Admit: 2023-01-19 | Discharge: 2023-01-19 | Disposition: A | Discharge status: Home / Self Care | Payer: HMO | Source: Ambulatory Visit | Attending: Family Medicine | Admitting: Family Medicine

## 2023-01-19 DIAGNOSIS — M47816 Spondylosis without myelopathy or radiculopathy, lumbar region: Secondary | ICD-10-CM | POA: Diagnosis not present

## 2023-01-19 DIAGNOSIS — R921 Mammographic calcification found on diagnostic imaging of breast: Secondary | ICD-10-CM

## 2023-01-19 DIAGNOSIS — M533 Sacrococcygeal disorders, not elsewhere classified: Secondary | ICD-10-CM | POA: Diagnosis not present

## 2023-01-19 DIAGNOSIS — R928 Other abnormal and inconclusive findings on diagnostic imaging of breast: Secondary | ICD-10-CM

## 2023-01-27 ENCOUNTER — Ambulatory Visit
Admission: RE | Admit: 2023-01-27 | Discharge: 2023-01-27 | Disposition: A | Discharge status: Home / Self Care | Payer: HMO | Source: Ambulatory Visit | Attending: Family Medicine | Admitting: Family Medicine

## 2023-01-27 DIAGNOSIS — G4733 Obstructive sleep apnea (adult) (pediatric): Secondary | ICD-10-CM | POA: Diagnosis not present

## 2023-01-27 DIAGNOSIS — R921 Mammographic calcification found on diagnostic imaging of breast: Secondary | ICD-10-CM

## 2023-01-27 DIAGNOSIS — D0511 Intraductal carcinoma in situ of right breast: Secondary | ICD-10-CM | POA: Diagnosis not present

## 2023-01-27 HISTORY — PX: BREAST BIOPSY: SHX20

## 2023-01-28 ENCOUNTER — Other Ambulatory Visit: Payer: Self-pay | Admitting: Family Medicine

## 2023-01-28 DIAGNOSIS — C50911 Malignant neoplasm of unspecified site of right female breast: Secondary | ICD-10-CM

## 2023-01-28 DIAGNOSIS — R921 Mammographic calcification found on diagnostic imaging of breast: Secondary | ICD-10-CM

## 2023-02-05 ENCOUNTER — Ambulatory Visit
Admission: RE | Admit: 2023-02-05 | Discharge: 2023-02-05 | Disposition: A | Discharge status: Home / Self Care | Payer: HMO | Source: Ambulatory Visit | Attending: Family Medicine | Admitting: Family Medicine

## 2023-02-05 DIAGNOSIS — N641 Fat necrosis of breast: Secondary | ICD-10-CM | POA: Diagnosis not present

## 2023-02-05 DIAGNOSIS — R921 Mammographic calcification found on diagnostic imaging of breast: Secondary | ICD-10-CM

## 2023-02-05 DIAGNOSIS — C50911 Malignant neoplasm of unspecified site of right female breast: Secondary | ICD-10-CM

## 2023-02-05 HISTORY — PX: BREAST BIOPSY: SHX20

## 2023-02-12 ENCOUNTER — Other Ambulatory Visit: Payer: Self-pay | Admitting: General Surgery

## 2023-02-12 DIAGNOSIS — D0511 Intraductal carcinoma in situ of right breast: Secondary | ICD-10-CM | POA: Diagnosis not present

## 2023-02-16 ENCOUNTER — Other Ambulatory Visit: Payer: Self-pay | Admitting: General Surgery

## 2023-02-16 DIAGNOSIS — D0511 Intraductal carcinoma in situ of right breast: Secondary | ICD-10-CM

## 2023-02-18 ENCOUNTER — Other Ambulatory Visit: Payer: Self-pay | Admitting: Internal Medicine

## 2023-02-18 MED ORDER — PROMETHAZINE HCL 25 MG PO TABS: 25.0000 mg | ORAL_TABLET | Freq: Four times a day (QID) | ORAL | 0 refills | Status: DC | PRN

## 2023-02-18 MED ORDER — ONDANSETRON 4 MG PO TBDP: 4.0000 mg | ORAL_TABLET | Freq: Four times a day (QID) | ORAL | 0 refills | Status: DC | PRN

## 2023-02-22 ENCOUNTER — Telehealth: Payer: Self-pay

## 2023-02-22 NOTE — Telephone Encounter (Signed)
PA request received via CMM for Ondansetron 4MG  dispersible tablets  PA has been submitted by fax through [facility] to [employer] and is pending determination  Key: [id number]

## 2023-02-24 ENCOUNTER — Telehealth: Payer: Self-pay

## 2023-02-24 NOTE — Telephone Encounter (Signed)
Patient is having watery urgent stools. She has urgency with no warning and no cramping. She had a choley 2 yrs ago and wonders if this is the cause. Please advise

## 2023-02-25 ENCOUNTER — Other Ambulatory Visit: Payer: Self-pay

## 2023-02-25 DIAGNOSIS — R110 Nausea: Secondary | ICD-10-CM

## 2023-02-25 MED ORDER — ONDANSETRON HCL 4 MG PO TABS: 4.0000 mg | ORAL_TABLET | Freq: Four times a day (QID) | ORAL | 0 refills | Status: DC | PRN

## 2023-02-25 NOTE — Telephone Encounter (Signed)
Pt notified of the change. Pt stated that she picked up the zofran disintergrating tablets already and it only cost her 14 dollars.

## 2023-02-25 NOTE — Telephone Encounter (Signed)
I am not going to be able to do anything about this denial though she could have oral (not disintegrating) ondansetron if desired  Same sig and same # disp

## 2023-02-25 NOTE — Telephone Encounter (Signed)
Left message for pt to call back  °

## 2023-02-25 NOTE — Telephone Encounter (Signed)
PT returning call

## 2023-02-25 NOTE — Telephone Encounter (Signed)
PA has been DENIED and denial letter has been attached in patients documents.

## 2023-02-25 NOTE — Telephone Encounter (Signed)
Pt stated that she has been having urgent diarrhea off and on the last 2 weeks. Not consistent. March  15th,  23rd, 26th 27th.  Chart reviewed. Last office visit was 2022. Pt was scheduled for an office visit on 03/11/2023 at 1:30 PM with Dr. Carlean Purl. Pt made aware. Pt verbalized understanding with all questions answered.

## 2023-02-25 NOTE — Telephone Encounter (Signed)
Please see notes below and advise 

## 2023-03-02 ENCOUNTER — Other Ambulatory Visit: Payer: Self-pay | Admitting: Internal Medicine

## 2023-03-02 DIAGNOSIS — K219 Gastro-esophageal reflux disease without esophagitis: Secondary | ICD-10-CM

## 2023-03-02 NOTE — Pre-Procedure Instructions (Signed)
Surgical Instructions    Your procedure is scheduled on March 10, 2023.  Report to [facility] Main Entrance "A" at 6:30 A.M., then check in with the Admitting office.  Call this number if you have problems the morning of surgery:  [phone]  If you have any questions prior to your surgery date call [phone]: Open Monday-Friday 8am-4pm If you experience any cold or flu symptoms such as cough, fever, chills, shortness of breath, etc. between now and your scheduled surgery, please notify us at the above number.     Remember:  Do not eat after midnight the night before your surgery  You may drink clear liquids until 5:30 AM the morning of your surgery.   Clear liquids allowed are: Water, Non-Citrus Juices (without pulp), Carbonated Beverages, Clear Tea, Black Coffee Only (NO MILK, CREAM OR POWDERED CREAMER of any kind), and Gatorade.  Patient Instructions  The night before surgery:  No food after midnight. ONLY clear liquids after midnight  The day of surgery (if you do NOT have diabetes):  Drink ONE (1) Pre-Surgery Clear Ensure by 5:30 AM the morning of surgery. Drink in one sitting. Do not sip.  This drink was given to you during your hospital  pre-op appointment visit.  Nothing else to drink after completing the  Pre-Surgery Clear Ensure.         If you have questions, please contact your surgeon's office.     Take these medicines the morning of surgery with A SIP OF WATER:  citalopram (CELEXA)   fexofenadine (ALLEGRA)   pantoprazole (PROTONIX)     May take these medicines IF NEEDED:  acetaminophen (TYLENOL)   cyclobenzaprine (FLEXERIL)   dicyclomine (BENTYL)   fluticasone (FLONASE) nasal spray   methocarbamol (ROBAXIN)   ondansetron (ZOFRAN)   promethazine (PHENERGAN)   sodium chloride (OCEAN) nasal spray   traMADol (ULTRAM)    As of today, STOP taking any Aspirin (unless otherwise instructed by your surgeon) Aleve, Naproxen, Ibuprofen, Motrin, Advil,  Goody's, BC's, all herbal medications, fish oil, and all vitamins.                     Do NOT Smoke (Tobacco/Vaping) for 24 hours prior to your procedure.  If you use a CPAP at night, you may bring your mask/headgear for your overnight stay.   Contacts, glasses, piercing's, hearing aid's, dentures or partials may not be worn into surgery, please bring cases for these belongings.    For patients admitted to the hospital, discharge time will be determined by your treatment team.   Patients discharged the day of surgery will not be allowed to drive home, and someone needs to stay with them for 24 hours.  SURGICAL WAITING ROOM VISITATION Patients having surgery or a procedure may have no more than 2 support people in the waiting area - these visitors may rotate.   Children under the age of 62 must have an adult with them who is not the patient. If the patient needs to stay at the hospital during part of their recovery, the visitor guidelines for inpatient rooms apply. Pre-op nurse will coordinate an appropriate time for 1 support person to accompany patient in pre-op.  This support person may not rotate.   Please refer to the [facility] website for the visitor guidelines for Inpatients (after your surgery is over and you are in a regular room).    Special instructions:   [place]- Preparing For Surgery  Before surgery, you can play  an important role. Because skin is not sterile, your skin needs to be as free of germs as possible. You can reduce the number of germs on your skin by washing with CHG (chlorahexidine gluconate) Soap before surgery.  CHG is an antiseptic cleaner which kills germs and bonds with the skin to continue killing germs even after washing.    Oral Hygiene is also important to reduce your risk of infection.  Remember - BRUSH YOUR TEETH THE MORNING OF SURGERY WITH YOUR REGULAR TOOTHPASTE  Please do not use if you have an allergy to CHG or antibacterial soaps. If your  skin becomes reddened/irritated stop using the CHG.  Do not shave (including legs and underarms) for at least 48 hours prior to first CHG shower. It is OK to shave your face.  Please follow these instructions carefully.   Shower the NIGHT BEFORE SURGERY and the MORNING OF SURGERY  If you chose to wash your hair, wash your hair first as usual with your normal shampoo.  After you shampoo, rinse your hair and body thoroughly to remove the shampoo.  Use CHG Soap as you would any other liquid soap. You can apply CHG directly to the skin and wash gently with a scrungie or a clean washcloth.   Apply the CHG Soap to your body ONLY FROM THE NECK DOWN.  Do not use on open wounds or open sores. Avoid contact with your eyes, ears, mouth and genitals (private parts). Wash Face and genitals (private parts)  with your normal soap.   Wash thoroughly, paying special attention to the area where your surgery will be performed.  Thoroughly rinse your body with warm water from the neck down.  DO NOT shower/wash with your normal soap after using and rinsing off the CHG Soap.  Pat yourself dry with a CLEAN TOWEL.  Wear CLEAN PAJAMAS to bed the night before surgery  Place CLEAN SHEETS on your bed the night before your surgery  DO NOT SLEEP WITH PETS.   Day of Surgery: Take a shower with CHG soap. Do not wear jewelry or makeup Do not wear lotions, powders, perfumes/colognes, or deodorant. Do not shave 48 hours prior to surgery.  Men may shave face and neck. Do not bring valuables to the hospital.  [facility] is not responsible for any belongings or valuables. Do not wear nail polish, gel polish, artificial nails, or any other type of covering on natural nails (fingers and toes) If you have artificial nails or gel coating that need to be removed by a nail salon, please have this removed prior to surgery. Artificial nails or gel coating may interfere with anesthesia's ability to adequately monitor your  vital signs.  Wear Clean/Comfortable clothing the morning of surgery Remember to brush your teeth WITH YOUR REGULAR TOOTHPASTE.   Please read over the following fact sheets that you were given.    If you received a COVID test during your pre-op visit  it is requested that you wear a mask when out in public, stay away from anyone that may not be feeling well and notify your surgeon if you develop symptoms. If you have been in contact with anyone that has tested positive in the last 10 days please notify you surgeon.

## 2023-03-03 ENCOUNTER — Other Ambulatory Visit: Payer: Self-pay

## 2023-03-03 ENCOUNTER — Encounter (HOSPITAL_COMMUNITY): Payer: Self-pay

## 2023-03-03 ENCOUNTER — Encounter (HOSPITAL_COMMUNITY)
Admission: RE | Admit: 2023-03-03 | Discharge: 2023-03-03 | Disposition: A | Discharge status: Home / Self Care | Payer: HMO | Source: Ambulatory Visit | Attending: General Surgery | Admitting: General Surgery

## 2023-03-03 VITALS — BP 145/62 | HR 74 | Temp 98.6°F | Resp 17 | Ht 64.5 in | Wt 174.0 lb

## 2023-03-03 DIAGNOSIS — I080 Rheumatic disorders of both mitral and aortic valves: Secondary | ICD-10-CM | POA: Insufficient documentation

## 2023-03-03 DIAGNOSIS — Z01818 Encounter for other preprocedural examination: Secondary | ICD-10-CM | POA: Diagnosis not present

## 2023-03-03 DIAGNOSIS — K219 Gastro-esophageal reflux disease without esophagitis: Secondary | ICD-10-CM | POA: Diagnosis not present

## 2023-03-03 DIAGNOSIS — G4733 Obstructive sleep apnea (adult) (pediatric): Secondary | ICD-10-CM | POA: Insufficient documentation

## 2023-03-03 DIAGNOSIS — I272 Pulmonary hypertension, unspecified: Secondary | ICD-10-CM | POA: Insufficient documentation

## 2023-03-03 DIAGNOSIS — I10 Essential (primary) hypertension: Secondary | ICD-10-CM | POA: Diagnosis not present

## 2023-03-03 HISTORY — DX: Malignant (primary) neoplasm, unspecified: C80.1

## 2023-03-03 LAB — BASIC METABOLIC PANEL
Anion gap: 11 (ref 5–15)
BUN: 7 mg/dL — ABNORMAL LOW (ref 8–23)
CO2: 29 mmol/L (ref 22–32)
Calcium: 9.1 mg/dL (ref 8.9–10.3)
Chloride: 95 mmol/L — ABNORMAL LOW (ref 98–111)
Creatinine, Ser: 0.71 mg/dL (ref 0.44–1.00)
GFR, Estimated: >60 mL/min (ref >60)
Glucose, Bld: 111 mg/dL — ABNORMAL HIGH (ref 70–99)
Potassium: 3.8 mmol/L (ref 3.5–5.1)
Sodium: 135 mmol/L (ref 135–145)

## 2023-03-03 LAB — CBC
HCT: 37.5 % (ref 36.0–46.0)
Hemoglobin: 12.3 g/dL (ref 12.0–15.0)
MCH: 29.4 pg (ref 26.0–34.0)
MCHC: 32.8 g/dL (ref 30.0–36.0)
MCV: 89.5 fL (ref 80.0–100.0)
Platelets: 231 10*3/uL (ref 150–400)
RBC: 4.19 MIL/uL (ref 3.87–5.11)
RDW: 13.2 % (ref 11.5–15.5)
WBC: 5.7 10*3/uL (ref 4.0–10.5)
nRBC: 0 % (ref 0.0–0.2)

## 2023-03-03 NOTE — Progress Notes (Signed)
PCP - Carol Ada Cardiologist -Ida Rogue; last visit in spring 2023 due to Covid vaccine complication.  PPM/ICD - denies Device Orders - n/a Rep Notified - n/a  Chest x-ray - denies EKG - 03/03/2023 Stress Test - 05/16/2016 ECHO - 04/21/19 Cardiac Cath - 08/13/2016  Sleep Study - yes CPAP - yes  Fasting Blood Sugar - no DM  Last dose of GLP1 agonist-  n/a GLP1 instructions: n/a  Blood Thinner Instructions: n/a Aspirin Instructions:n/a  ERAS Protcol -yes PRE-SURGERY Ensure or G2- given; pt instructed to drink it before 5:30 am DOS  COVID TEST- n/a   Anesthesia review: yes; pt has breast seed placement 03/09/2023  Patient denies shortness of breath, fever, cough and chest pain at PAT appointment and for the last two months.   All instructions explained to the patient, with a verbal understanding of the material. Patient agrees to go over the instructions while at home for a better understanding. Patient also instructed to self quarantine after being tested for COVID-19. The opportunity to ask questions was provided.

## 2023-03-04 DIAGNOSIS — M533 Sacrococcygeal disorders, not elsewhere classified: Secondary | ICD-10-CM | POA: Diagnosis not present

## 2023-03-04 DIAGNOSIS — M545 Low back pain, unspecified: Secondary | ICD-10-CM | POA: Diagnosis not present

## 2023-03-04 NOTE — Progress Notes (Signed)
Anesthesia Chart Review:  History includes never smoker, post-operative N/V, HTN, GERD, hiatal hernia, anemia, OSA (uses CPAP), seasonal asthma, TIA? (2008 versus sleep deprivation), cholecystectomy (03/25/21), sinus surgery (septoplasty & turbinate reduction 11/18/16), spinal surgery (C6-7 10/25/17).     Last cardiologist visit was on 12/31/20 with Dr. Rockey Situ.  He noted chronic lateral negative T waves on EKG with prior negative stress test in 2017. She has had work-up for chronic dyspnea (most studies done in 2017) including CTA chest showing no PE or ILD, normal PFTs except decreased DLCO, non-ischemic Cardiolite, near normal CPX primarily limited by body habitus, normal RHC (with pulmonary HTN by previous echo), and most recently a 2020 echo showed normal LVEF, impaired LV relaxation, normal RVSF, mild MI/MR. She is using CPAP for OSA. He advised as needed cardiology follow-up.   Preop labs reviewed, unremarkable.  EKG 03/03/2023: NSR.  Rate 66.  Anteroseptal infarct, age undetermined.  Lateral ST and T wave abnormality.  Does not appear significantly changed.  Echo 04/21/19: Sonographer Comments: Suboptimal subcostal window.  IMPRESSIONS   1. The left ventricle has normal systolic function with an ejection  fraction of 60-65%. The cavity size was normal. There is mildly increased  left ventricular wall thickness. Left ventricular diastolic Doppler  parameters are consistent with impaired  relaxation.   2. The right ventricle has normal systolic function. The cavity was  normal. There is no increase in right ventricular wall thickness.Unable to  estimate RVSP   3. Aortic valve regurgitation is mild by color flow Doppler.   4. Mild mitral valve regurgitation    RHC 08/13/16: Normal study.  No evidence for pulmonary hypertension and normal filling pressures.     CPX 05/26/16: Conclusion: Exercise testing with gas-exchange demonstrates  normal functional capacity when compared to matched  sedentary  norms. There is no evidence of cardiopulmonary limitation or  exercise-induced bronchospasm. Patient appears primarily limited  due to her body habitus. There was also chronotropic  incompetence.    Nuclear stress test 04/30/16: Nuclear stress EF: 72%. There was no ST segment deviation noted during stress. This is a low risk study. The left ventricular ejection fraction is hyperdynamic (>65%). Breast artifact no ischemia or infarction EF normal 72% no RWMAls    Wynonia Musty [facility] Short Stay Center/Anesthesiology Phone [phone] 03/04/2023 11:06 AM

## 2023-03-04 NOTE — Anesthesia Preprocedure Evaluation (Addendum)
Anesthesia Evaluation  Patient identified by MRN, date of birth, ID band Patient awake    Reviewed: Allergy & Precautions, NPO status , Patient's Chart, lab work & pertinent test results  History of Anesthesia Complications (+) PONV and history of anesthetic complications  Airway Mallampati: II  TM Distance: >3 FB Neck ROM: Full    Dental  (+) Dental Advisory Given   Pulmonary asthma , sleep apnea    breath sounds clear to auscultation       Cardiovascular hypertension, Pt. on medications  Rhythm:Regular Rate:Normal     Neuro/Psych  Neuromuscular disease CVA    GI/Hepatic Neg liver ROS,GERD  ,,  Endo/Other  negative endocrine ROS    Renal/GU negative Renal ROS     Musculoskeletal  (+) Arthritis ,    Abdominal   Peds  Hematology negative hematology ROS (+)   Anesthesia Other Findings   Reproductive/Obstetrics                             Anesthesia Physical Anesthesia Plan  ASA: 2  Anesthesia Plan: General   Post-op Pain Management: Tylenol PO (pre-op)* and Toradol IV (intra-op)*   Induction: Intravenous  PONV Risk Score and Plan: 4 or greater and Dexamethasone, Ondansetron and Treatment may vary due to age or medical condition  Airway Management Planned: LMA  Additional Equipment:   Intra-op Plan:   Post-operative Plan: Extubation in OR  Informed Consent: I have reviewed the patients History and Physical, chart, labs and discussed the procedure including the risks, benefits and alternatives for the proposed anesthesia with the patient or authorized representative who has indicated his/her understanding and acceptance.     Dental advisory given  Plan Discussed with:   Anesthesia Plan Comments: (PAT note by Antionette Poles, PA-C: History includes never smoker, post-operative N/V, HTN, GERD, hiatal hernia, anemia, OSA (uses CPAP), seasonal asthma, TIA? (2008 versus sleep  deprivation), cholecystectomy (03/25/21), sinus surgery (septoplasty & turbinate reduction 11/18/16), spinal surgery (C6-7 10/25/17).    Last cardiologist visit was on 12/31/20 with Dr. Mariah Milling.  He noted chronic lateral negative T waves on EKG with prior negative stress test in 2017. She has had work-up for chronic dyspnea (most studies done in 2017) including CTA chest showing no PE or ILD, normal PFTs except decreased DLCO, non-ischemic Cardiolite, near normal CPX primarily limited by body habitus, normal RHC (with pulmonary HTN by previous echo), and most recently a 2020 echo showed normal LVEF, impaired LV relaxation, normal RVSF, mild MI/MR. She is using CPAP for OSA. He advised as needed cardiology follow-up.   Preop labs reviewed, unremarkable.  EKG 03/03/2023: NSR.  Rate 66.  Anteroseptal infarct, age undetermined.  Lateral ST and T wave abnormality.  Does not appear significantly changed.  Echo 04/21/19: Sonographer Comments: Suboptimal subcostal window.  IMPRESSIONS  1. The left ventricle has normal systolic function with an ejection  fraction of 60-65%. The cavity size was normal. There is mildly increased  left ventricular wall thickness. Left ventricular diastolic Doppler  parameters are consistent with impaired  relaxation.  2. The right ventricle has normal systolic function. The cavity was  normal. There is no increase in right ventricular wall thickness.Unable to  estimate RVSP  3. Aortic valve regurgitation is mild by color flow Doppler.  4. Mild mitral valve regurgitation   RHC 08/13/16: Normal study. No evidence for pulmonary hypertension and normal filling pressures.   CPX 05/26/16: Conclusion: Exercise testing with gas-exchange demonstrates  normal functional capacity when compared to matched sedentary  norms. There is no evidence of cardiopulmonary limitation or  exercise-induced bronchospasm. Patient appears primarily limited  due to her body habitus. There was  also chronotropic  incompetence.   Nuclear stress test 04/30/16: ? Nuclear stress EF: 72%. ? There was no ST segment deviation noted during stress. ? This is a low risk study. ? The left ventricular ejection fraction is hyperdynamic (>65%). Breast artifact no ischemia or infarction EF normal 72% no RWMAls   )        Anesthesia Quick Evaluation

## 2023-03-09 ENCOUNTER — Ambulatory Visit
Admission: RE | Admit: 2023-03-09 | Discharge: 2023-03-09 | Disposition: A | Discharge status: Home / Self Care | Payer: HMO | Source: Ambulatory Visit | Attending: General Surgery | Admitting: General Surgery

## 2023-03-09 DIAGNOSIS — D0511 Intraductal carcinoma in situ of right breast: Secondary | ICD-10-CM

## 2023-03-09 DIAGNOSIS — R928 Other abnormal and inconclusive findings on diagnostic imaging of breast: Secondary | ICD-10-CM | POA: Diagnosis not present

## 2023-03-09 HISTORY — PX: BREAST BIOPSY: SHX20

## 2023-03-09 NOTE — Progress Notes (Signed)
Patient was called to informed that the surgery time for tomorrow was changed to 09:00 o'clock. Patient was not available and this Clinical research associate talked with the patient's husband. Patient was instructed to be at the hospital at 07:00 o'clock and stop drinking clear liquids at 06:00 o'clock. Patient's husband verbalized understanding.

## 2023-03-09 NOTE — H&P (View-Only) (Signed)
76 yof I know from prior abdominal surgery presents after screening mammogram. She had no mass or dc. She has c density breast tissue. She had calcifications noted on screening. There is 19 mm area in the lateral inferior right breast and a group in the central inferior right breast appeared to be stable. 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 The other area was then biopsied and this is ADH. She is here with her husband and daughter to discuss options  Review of Systems: A complete review of systems was obtained from the patient. I have reviewed this information and discussed as appropriate with the patient. See HPI as well for other ROS.  Review of Systems All other systems reviewed and are negative.  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 Lower limb pain, inferior, right Lumbosacral radiculitis Moderate persistent asthma Multiple thyroid nodules Neck pain Obesity Obstructive sleep apnea (adult) (pediatric) Other cervical disc displacement, unspecified cervical region Pain in joint of right shoulder Partial thickness rotator cuff tear Pulmonary HTN (CMS-HCC) Dyspnea and respiratory abnormality SOB (shortness of breath) Wheeze  Past Surgical History: Procedure Laterality Date SPINAL SURGERY 09/2017 SHOULDER SURGERY 12/2018 LAPAROSCOPIC INCISIONAL / UMBILICAL / VENTRAL HERNIA REPAIR  12/24/2021 CHOLECYSTECTOMY FUNCTIONAL ENDOSCOPIC SINUS SURGERY   Allergies Allergen Reactions Nsaids (Non-Steroidal Anti-Inflammatory Drug) Abdominal Pain Penicillins Other (See Comments), Rash and Hives Patient has had a PCN reaction with immediate rash, facial/tongue/throat swelling, SOB, and or lightheadedness with hypotension; Has also developed severe rash involving mucus membranes or skin necrosis. Cefuroxime Axetil Other (See Comments) Lack of Therapeutic Effect  Hydroxyzine Hcl Other (See Comments) UNSPECIFIED REACTION  Promethazine Other (See Comments) Other reaction(s): hyperactivity  Zolpidem Other (See Comments) groggy   Ciprofloxacin Anxiety Lactose Other (See Comments) bloating  Lactulose Other (See Comments) bloating  Sulfamethoxazole-Trimethoprim Rash  Current Outpatient Medications on File Prior to Visit Medication Sig Dispense Refill citalopram (CELEXA) 10 MG tablet citalopram 10 mg tablet TAKE 1 TABLET BY MOUTH EVERY DAY FOR 90 DAYS fexofenadine-pseudoephedrine (ALLEGRA-D 12H) 60-120 mg ER tablet Take by mouth hydroCHLOROthiazide (HYDRODIURIL) 25 MG tablet hydrochlorothiazide 25 mg tablet TAKE 1 TABLET BY MOUTH EVERY DAY losartan (COZAAR) 25 MG tablet Take 1 tablet by mouth once daily pantoprazole (PROTONIX) 40 MG DR tablet pantoprazole 40 mg tablet,delayed release potassium chloride (MICRO-K) 10 MEQ ER capsule 2 CAPSULES BY MOUTH ONCE A DAY 90 DAYS traZODone (DESYREL) 50 MG tablet trazodone 50 mg tablet TAKE 1 TO 1&1/2 TABLETS BY MOUTH AT BEDTIME 90 DAYS   History reviewed. No pertinent family history.  Social History  Tobacco Use Smoking Status Never Smokeless Tobacco Never Marital status: Married Tobacco Use Smoking status: Never Smokeless tobacco: Never Vaping Use Vaping Use: Never used Substance and Sexual Activity Alcohol use: Not Currently Drug use: Never  Objective:  Vitals: 02/12/23 1032 BP: (!) 162/60 Pulse: 73 Weight:  79.5 kg (175 lb 3.2 oz) Height: 162.6 cm (5' 4")  Body mass index is 30.07 kg/m.  Physical Exam Vitals reviewed. Constitutional: Appearance: Normal appearance. Chest: Breasts: Right: No inverted nipple, mass or nipple discharge. Left: No inverted nipple,   mass or nipple discharge. Lymphadenopathy: Upper Body: Right upper body: No supraclavicular or axillary adenopathy. Left upper body: No supraclavicular or axillary adenopathy. Neurological: Mental Status: She is alert.   Assessment and Plan:  Ductal carcinoma in situ (DCIS) of right breast  Right breast seed guided lumpectomy Right breast seed excisional biopsy  We discussed the staging and pathophysiology of breast cancer. We discussed all of the different options for treatment for breast cancer including surgery, chemotherapy, radiation therapy, Herceptin, and antiestrogen therapy.we discussed the noninvasive nature of dcis. We also discussed rationale for excision of ADH with upgrade possibility.  She does not need sn biopsy given dcis.  We discussed the options for treatment of the breast cancer which included lumpectomy versus a mastectomy. We discussed the performance of the lumpectomy with radioactive seed placement. Will place two seeds for lumpectomy and excisional biopsy. We discussed a 5-10% chance of a positive margin requiring reexcision in the operating room. We also discussed that radiation is possibility if she undergoes lumpectomy. We discussed mastectomy and the postoperative care for that as well. Mastectomy can be followed by reconstruction. Most mastectomy patients will not need radiation therapy. We discussed that there is no difference in her survival whether she undergoes lumpectomy with radiation therapy or antiestrogen therapy versus a mastectomy. There is also no real difference between her recurrence in the breast.  We discussed the risks of operation including bleeding, infection, possible reoperation.  She understands her further therapy will be based on what her stages at the time of her operation.  

## 2023-03-09 NOTE — H&P (Signed)
76 yof I know from prior abdominal surgery presents after screening mammogram. She had no mass or dc. She has c density breast tissue. She had calcifications noted on screening. There is 19 mm area in the lateral inferior right breast and a group in the central inferior right breast appeared to be stable. 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 The other area was then biopsied and this is ADH. She is here with her husband and daughter to discuss options  Review of Systems: A complete review of systems was obtained from the patient. I have reviewed this information and discussed as appropriate with the patient. See HPI as well for other ROS.  Review of Systems All other systems reviewed and are negative.  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 Lower limb pain, inferior, right Lumbosacral radiculitis Moderate persistent asthma Multiple thyroid nodules Neck pain Obesity Obstructive sleep apnea (adult) (pediatric) Other cervical disc displacement, unspecified cervical region Pain in joint of right shoulder Partial thickness rotator cuff tear Pulmonary HTN (CMS-HCC) Dyspnea and respiratory abnormality SOB (shortness of breath) Wheeze  Past Surgical History: Procedure Laterality Date SPINAL SURGERY 09/2017 SHOULDER SURGERY 12/2018 LAPAROSCOPIC INCISIONAL / UMBILICAL / VENTRAL HERNIA REPAIR  12/24/2021 CHOLECYSTECTOMY FUNCTIONAL ENDOSCOPIC SINUS SURGERY   Allergies Allergen Reactions Nsaids (Non-Steroidal Anti-Inflammatory Drug) Abdominal Pain Penicillins Other (See Comments), Rash and Hives Patient has had a PCN reaction with immediate rash, facial/tongue/throat swelling, SOB, and or lightheadedness with hypotension; Has also developed severe rash involving mucus membranes or skin necrosis. Cefuroxime Axetil Other (See Comments) Lack of Therapeutic Effect  Hydroxyzine Hcl Other (See Comments) UNSPECIFIED REACTION  Promethazine Other (See Comments) Other reaction(s): hyperactivity  Zolpidem Other (See Comments) groggy   Ciprofloxacin Anxiety Lactose Other (See Comments) bloating  Lactulose Other (See Comments) bloating  Sulfamethoxazole-Trimethoprim Rash  Current Outpatient Medications on File Prior to Visit Medication Sig Dispense Refill citalopram (CELEXA) 10 MG tablet citalopram 10 mg tablet TAKE 1 TABLET BY MOUTH EVERY DAY FOR 90 DAYS fexofenadine-pseudoephedrine (ALLEGRA-D 12H) 60-120 mg ER tablet Take by mouth hydroCHLOROthiazide (HYDRODIURIL) 25 MG tablet hydrochlorothiazide 25 mg tablet TAKE 1 TABLET BY MOUTH EVERY DAY losartan (COZAAR) 25 MG tablet Take 1 tablet by mouth once daily pantoprazole (PROTONIX) 40 MG DR tablet pantoprazole 40 mg tablet,delayed release potassium chloride (MICRO-K) 10 MEQ ER capsule 2 CAPSULES BY MOUTH ONCE A DAY 90 DAYS traZODone (DESYREL) 50 MG tablet trazodone 50 mg tablet TAKE 1 TO 1&1/2 TABLETS BY MOUTH AT BEDTIME 90 DAYS   History reviewed. No pertinent family history.  Social History  Tobacco Use Smoking Status Never Smokeless Tobacco Never Marital status: Married Tobacco Use Smoking status: Never Smokeless tobacco: Never Vaping Use Vaping Use: Never used Substance and Sexual Activity Alcohol use: Not Currently Drug use: Never  Objective:  Vitals: 02/12/23 1032 BP: (!) 162/60 Pulse: 73 Weight:  79.5 kg (175 lb 3.2 oz) Height: 162.6 cm (5\' 4" )  Body mass index is 30.07 kg/m.  Physical Exam Vitals reviewed. Constitutional: Appearance: Normal appearance. Chest: Breasts: Right: No inverted nipple, mass or nipple discharge. Left: No inverted nipple,  mass or nipple discharge. Lymphadenopathy: Upper Body: Right upper body: No supraclavicular or axillary adenopathy. Left upper body: No supraclavicular or axillary adenopathy. Neurological: Mental Status: She is alert.   Assessment and Plan:  Ductal carcinoma in situ (DCIS) of right breast  Right breast seed guided lumpectomy Right breast seed excisional biopsy  We discussed the staging and pathophysiology of breast cancer. We discussed all of the different options for treatment for breast cancer including surgery, chemotherapy, radiation therapy, Herceptin, and antiestrogen therapy.we discussed the noninvasive nature of dcis. We also discussed rationale for excision of ADH with upgrade possibility.  She does not need sn biopsy given dcis.  We discussed the options for treatment of the breast cancer which included lumpectomy versus a mastectomy. We discussed the performance of the lumpectomy with radioactive seed placement. Will place two seeds for lumpectomy and excisional biopsy. We discussed a 5-10% chance of a positive margin requiring reexcision in the operating room. We also discussed that radiation is possibility if she undergoes lumpectomy. We discussed mastectomy and the postoperative care for that as well. Mastectomy can be followed by reconstruction. Most mastectomy patients will not need radiation therapy. We discussed that there is no difference in her survival whether she undergoes lumpectomy with radiation therapy or antiestrogen therapy versus a mastectomy. There is also no real difference between her recurrence in the breast.  We discussed the risks of operation including bleeding, infection, possible reoperation.  She understands her further therapy will be based on what her stages at the time of her operation.

## 2023-03-10 ENCOUNTER — Ambulatory Visit (HOSPITAL_BASED_OUTPATIENT_CLINIC_OR_DEPARTMENT_OTHER): Payer: HMO | Admitting: Certified Registered"

## 2023-03-10 ENCOUNTER — Ambulatory Visit (HOSPITAL_COMMUNITY)
Admission: RE | Admit: 2023-03-10 | Discharge: 2023-03-10 | Disposition: A | Discharge status: Home / Self Care | Payer: HMO | Attending: General Surgery | Admitting: General Surgery

## 2023-03-10 ENCOUNTER — Other Ambulatory Visit: Payer: Self-pay

## 2023-03-10 ENCOUNTER — Encounter (HOSPITAL_COMMUNITY)
Admission: RE | Disposition: A | Discharge status: Home / Self Care | Payer: Self-pay | Source: Home / Self Care | Attending: General Surgery

## 2023-03-10 ENCOUNTER — Ambulatory Visit
Admission: RE | Admit: 2023-03-10 | Discharge: 2023-03-10 | Disposition: A | Discharge status: Home / Self Care | Payer: HMO | Source: Ambulatory Visit | Attending: General Surgery | Admitting: General Surgery

## 2023-03-10 ENCOUNTER — Encounter (HOSPITAL_COMMUNITY): Payer: Self-pay | Admitting: General Surgery

## 2023-03-10 ENCOUNTER — Ambulatory Visit (HOSPITAL_COMMUNITY): Payer: HMO | Admitting: Physician Assistant

## 2023-03-10 DIAGNOSIS — N6089 Other benign mammary dysplasias of unspecified breast: Secondary | ICD-10-CM | POA: Diagnosis not present

## 2023-03-10 DIAGNOSIS — Z8673 Personal history of transient ischemic attack (TIA), and cerebral infarction without residual deficits: Secondary | ICD-10-CM | POA: Diagnosis not present

## 2023-03-10 DIAGNOSIS — K219 Gastro-esophageal reflux disease without esophagitis: Secondary | ICD-10-CM | POA: Insufficient documentation

## 2023-03-10 DIAGNOSIS — N6011 Diffuse cystic mastopathy of right breast: Secondary | ICD-10-CM | POA: Diagnosis not present

## 2023-03-10 DIAGNOSIS — N6021 Fibroadenosis of right breast: Secondary | ICD-10-CM | POA: Insufficient documentation

## 2023-03-10 DIAGNOSIS — D0511 Intraductal carcinoma in situ of right breast: Secondary | ICD-10-CM | POA: Insufficient documentation

## 2023-03-10 DIAGNOSIS — N6041 Mammary duct ectasia of right breast: Secondary | ICD-10-CM | POA: Insufficient documentation

## 2023-03-10 DIAGNOSIS — R928 Other abnormal and inconclusive findings on diagnostic imaging of breast: Secondary | ICD-10-CM | POA: Diagnosis not present

## 2023-03-10 DIAGNOSIS — N6081 Other benign mammary dysplasias of right breast: Secondary | ICD-10-CM | POA: Insufficient documentation

## 2023-03-10 DIAGNOSIS — G4733 Obstructive sleep apnea (adult) (pediatric): Secondary | ICD-10-CM | POA: Diagnosis not present

## 2023-03-10 DIAGNOSIS — Z170 Estrogen receptor positive status [ER+]: Secondary | ICD-10-CM | POA: Insufficient documentation

## 2023-03-10 DIAGNOSIS — R9230 Dense breasts, unspecified: Secondary | ICD-10-CM | POA: Insufficient documentation

## 2023-03-10 DIAGNOSIS — I10 Essential (primary) hypertension: Secondary | ICD-10-CM | POA: Insufficient documentation

## 2023-03-10 HISTORY — PX: RADIOACTIVE SEED GUIDED EXCISIONAL BREAST BIOPSY: SHX6490

## 2023-03-10 HISTORY — PX: BREAST LUMPECTOMY WITH RADIOACTIVE SEED LOCALIZATION: SHX6424

## 2023-03-10 SURGERY — BREAST LUMPECTOMY WITH RADIOACTIVE SEED LOCALIZATION
Anesthesia: General | Site: Breast | Laterality: Right

## 2023-03-10 MED ORDER — CHLORHEXIDINE GLUCONATE 0.12 % MT SOLN
15.0000 mL | Freq: Once | OROMUCOSAL | Status: AC
  Administered 2023-03-10: 15 mL via OROMUCOSAL
  Filled 2023-03-10: qty 15

## 2023-03-10 MED ORDER — AMISULPRIDE (ANTIEMETIC) 5 MG/2ML IV SOLN
10.0000 mg | Freq: Once | INTRAVENOUS | Status: AC | PRN
  Administered 2023-03-10: 10 mg via INTRAVENOUS

## 2023-03-10 MED ORDER — CEFAZOLIN SODIUM-DEXTROSE 2-4 GM/100ML-% IV SOLN
2.0000 g | INTRAVENOUS | Status: AC
  Administered 2023-03-10: 2 g via INTRAVENOUS
  Filled 2023-03-10: qty 100

## 2023-03-10 MED ORDER — PROPOFOL 10 MG/ML IV BOLUS
INTRAVENOUS | Status: AC
  Filled 2023-03-10: qty 20

## 2023-03-10 MED ORDER — ACETAMINOPHEN 500 MG PO TABS
1000.0000 mg | ORAL_TABLET | Freq: Once | ORAL | Status: AC
  Administered 2023-03-10: 1000 mg via ORAL
  Filled 2023-03-10: qty 2

## 2023-03-10 MED ORDER — ONDANSETRON HCL 4 MG/2ML IJ SOLN
INTRAMUSCULAR | Status: AC
  Filled 2023-03-10: qty 2

## 2023-03-10 MED ORDER — FENTANYL CITRATE (PF) 250 MCG/5ML IJ SOLN
INTRAMUSCULAR | Status: DC | PRN
  Administered 2023-03-10: 100 ug via INTRAVENOUS

## 2023-03-10 MED ORDER — BUPIVACAINE HCL (PF) 0.25 % IJ SOLN
INTRAMUSCULAR | Status: AC
  Filled 2023-03-10: qty 30

## 2023-03-10 MED ORDER — FENTANYL CITRATE (PF) 250 MCG/5ML IJ SOLN
INTRAMUSCULAR | Status: AC
  Filled 2023-03-10: qty 5

## 2023-03-10 MED ORDER — FENTANYL CITRATE (PF) 100 MCG/2ML IJ SOLN
25.0000 ug | INTRAMUSCULAR | Status: DC | PRN
  Administered 2023-03-10 (×2): 50 ug via INTRAVENOUS

## 2023-03-10 MED ORDER — ONDANSETRON HCL 4 MG/2ML IJ SOLN
INTRAMUSCULAR | Status: DC | PRN
  Administered 2023-03-10: 4 mg via INTRAVENOUS

## 2023-03-10 MED ORDER — ACETAMINOPHEN 325 MG PO TABS: 650.0000 mg | ORAL_TABLET | ORAL | Status: DC | PRN

## 2023-03-10 MED ORDER — SODIUM CHLORIDE 0.9 % IV SOLN: INTRAVENOUS | Status: DC

## 2023-03-10 MED ORDER — OXYCODONE HCL 5 MG PO TABS
ORAL_TABLET | ORAL | Status: AC
  Filled 2023-03-10: qty 1

## 2023-03-10 MED ORDER — SODIUM CHLORIDE 0.9 % IV SOLN: 250.0000 mL | INTRAVENOUS | Status: DC | PRN

## 2023-03-10 MED ORDER — TRAMADOL HCL 50 MG PO TABS: 50.0000 mg | ORAL_TABLET | Freq: Four times a day (QID) | ORAL | 0 refills | Status: DC | PRN

## 2023-03-10 MED ORDER — DEXAMETHASONE SODIUM PHOSPHATE 10 MG/ML IJ SOLN
INTRAMUSCULAR | Status: DC | PRN
  Administered 2023-03-10: 10 mg via INTRAVENOUS

## 2023-03-10 MED ORDER — ENSURE PRE-SURGERY PO LIQD: 296.0000 mL | Freq: Once | ORAL | Status: DC

## 2023-03-10 MED ORDER — LIDOCAINE 2% (20 MG/ML) 5 ML SYRINGE
INTRAMUSCULAR | Status: DC | PRN
  Administered 2023-03-10: 60 mg via INTRAVENOUS

## 2023-03-10 MED ORDER — SODIUM CHLORIDE 0.9% FLUSH: 3.0000 mL | INTRAVENOUS | Status: DC | PRN

## 2023-03-10 MED ORDER — DEXAMETHASONE SODIUM PHOSPHATE 10 MG/ML IJ SOLN
INTRAMUSCULAR | Status: AC
  Filled 2023-03-10: qty 1

## 2023-03-10 MED ORDER — PROPOFOL 10 MG/ML IV BOLUS
INTRAVENOUS | Status: DC | PRN
  Administered 2023-03-10: 50 mg via INTRAVENOUS
  Administered 2023-03-10: 150 mg via INTRAVENOUS

## 2023-03-10 MED ORDER — FENTANYL CITRATE (PF) 100 MCG/2ML IJ SOLN
INTRAMUSCULAR | Status: AC
  Filled 2023-03-10: qty 2

## 2023-03-10 MED ORDER — BUPIVACAINE HCL (PF) 0.25 % IJ SOLN
INTRAMUSCULAR | Status: DC | PRN
  Administered 2023-03-10: 10 mL

## 2023-03-10 MED ORDER — OXYCODONE HCL 5 MG PO TABS
5.0000 mg | ORAL_TABLET | ORAL | Status: DC | PRN
  Administered 2023-03-10: 5 mg via ORAL

## 2023-03-10 MED ORDER — LIDOCAINE 2% (20 MG/ML) 5 ML SYRINGE
INTRAMUSCULAR | Status: AC
  Filled 2023-03-10: qty 5

## 2023-03-10 MED ORDER — 0.9 % SODIUM CHLORIDE (POUR BTL) OPTIME
TOPICAL | Status: DC | PRN
  Administered 2023-03-10: 1000 mL

## 2023-03-10 MED ORDER — LACTATED RINGERS IV SOLN: INTRAVENOUS | Status: DC

## 2023-03-10 MED ORDER — AMISULPRIDE (ANTIEMETIC) 5 MG/2ML IV SOLN
INTRAVENOUS | Status: AC
  Filled 2023-03-10: qty 4

## 2023-03-10 MED ORDER — ACETAMINOPHEN 650 MG RE SUPP: 650.0000 mg | RECTAL | Status: DC | PRN

## 2023-03-10 MED ORDER — ORAL CARE MOUTH RINSE: 15.0000 mL | Freq: Once | OROMUCOSAL | Status: AC

## 2023-03-10 SURGICAL SUPPLY — 43 items
ADH SKN CLS APL DERMABOND .7 (GAUZE/BANDAGES/DRESSINGS) ×1
APL PRP STRL LF DISP 70% ISPRP (MISCELLANEOUS) ×1
APPLIER CLIP 9.375 MED OPEN (MISCELLANEOUS)
APR CLP MED 9.3 20 MLT OPN (MISCELLANEOUS)
BAG COUNTER SPONGE SURGICOUNT (BAG) ×2 IMPLANT
BAG SPNG CNTER NS LX DISP (BAG) ×1
BINDER BREAST LRG (GAUZE/BANDAGES/DRESSINGS) IMPLANT
BINDER BREAST XLRG (GAUZE/BANDAGES/DRESSINGS) IMPLANT
CANISTER SUCT 3000ML PPV (MISCELLANEOUS) ×2 IMPLANT
CHLORAPREP W/TINT 26 (MISCELLANEOUS) ×2 IMPLANT
CLIP APPLIE 9.375 MED OPEN (MISCELLANEOUS) IMPLANT
CLIP TI MEDIUM 6 (CLIP) ×2 IMPLANT
CLSR STERI-STRIP ANTIMIC 1/2X4 (GAUZE/BANDAGES/DRESSINGS) IMPLANT
COVER PROBE W GEL 5X96 (DRAPES) ×2 IMPLANT
COVER SURGICAL LIGHT HANDLE (MISCELLANEOUS) ×2 IMPLANT
DERMABOND ADVANCED .7 DNX12 (GAUZE/BANDAGES/DRESSINGS) ×2 IMPLANT
DEVICE DUBIN SPECIMEN MAMMOGRA (MISCELLANEOUS) ×2 IMPLANT
DRAPE CHEST BREAST 15X10 FENES (DRAPES) ×2 IMPLANT
ELECT COATED BLADE 2.86 ST (ELECTRODE) ×2 IMPLANT
ELECT REM PT RETURN 9FT ADLT (ELECTROSURGICAL) ×1
ELECTRODE REM PT RTRN 9FT ADLT (ELECTROSURGICAL) ×2 IMPLANT
GLOVE BIO SURGEON STRL SZ7 (GLOVE) ×4 IMPLANT
GLOVE BIOGEL PI IND STRL 7.5 (GLOVE) ×2 IMPLANT
GOWN STRL REUS W/ TWL LRG LVL3 (GOWN DISPOSABLE) ×4 IMPLANT
GOWN STRL REUS W/TWL LRG LVL3 (GOWN DISPOSABLE) ×2
KIT BASIN OR (CUSTOM PROCEDURE TRAY) ×2 IMPLANT
KIT MARKER MARGIN INK (KITS) ×2 IMPLANT
LIGHT WAVEGUIDE WIDE FLAT (MISCELLANEOUS) IMPLANT
NDL HYPO 25GX1X1/2 BEV (NEEDLE) ×2 IMPLANT
NEEDLE HYPO 25GX1X1/2 BEV (NEEDLE) ×1 IMPLANT
NS IRRIG 1000ML POUR BTL (IV SOLUTION) ×2 IMPLANT
PACK GENERAL/GYN (CUSTOM PROCEDURE TRAY) ×2 IMPLANT
STRIP CLOSURE SKIN 1/2X4 (GAUZE/BANDAGES/DRESSINGS) ×2 IMPLANT
SUT MNCRL AB 4-0 PS2 18 (SUTURE) ×2 IMPLANT
SUT MON AB 5-0 PS2 18 (SUTURE) IMPLANT
SUT SILK 2 0 SH (SUTURE) IMPLANT
SUT VIC AB 2-0 SH 27 (SUTURE) ×2
SUT VIC AB 2-0 SH 27XBRD (SUTURE) ×2 IMPLANT
SUT VIC AB 3-0 SH 27 (SUTURE) ×1
SUT VIC AB 3-0 SH 27X BRD (SUTURE) ×2 IMPLANT
SYR CONTROL 10ML LL (SYRINGE) ×2 IMPLANT
TOWEL GREEN STERILE (TOWEL DISPOSABLE) ×2 IMPLANT
TOWEL GREEN STERILE FF (TOWEL DISPOSABLE) ×2 IMPLANT

## 2023-03-10 NOTE — Anesthesia Procedure Notes (Signed)
Procedure Name: LMA Insertion Date/Time: 03/10/2023 9:06 AM  Performed by: Cheree Ditto, CRNAPre-anesthesia Checklist: Patient identified, Emergency Drugs available, Suction available and Patient being monitored Patient Re-evaluated:Patient Re-evaluated prior to induction Oxygen Delivery Method: Circle system utilized Preoxygenation: Pre-oxygenation with 100% oxygen Induction Type: IV induction Ventilation: Mask ventilation without difficulty LMA: LMA with gastric port inserted LMA Size: 4.0 Placement Confirmation: breath sounds checked- equal and bilateral and positive ETCO2 Tube secured with: Tape Dental Injury: Teeth and Oropharynx as per pre-operative assessment  Comments: 14 Fr OGT per gastric port

## 2023-03-10 NOTE — Op Note (Signed)
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 Harden Mo Anesthesia: General Estimated blood loss: Minimal Specimens: 1.  Right breast seed guided lumpectomy containing the DCIS with the clip marked with paint 2.  Additional right breast medial margin marked short superior, long lateral, double deep 3.  Right breast seed guided excisional biopsy containing the other clip and seed which is also the superior margin of the lumpectomy. Complications: None Drains: None Sponge needle count was correct completion Disposition to recovery stable condition  Indications:76 yof I know from prior abdominal surgery presents after screening mammogram. She had no mass or dc. She has c density breast tissue. She had calcifications noted on screening. There is 19 mm area in the lateral inferior right breast and a group in the central inferior right breast appeared to be stable. 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 The other area was then biopsied and this is ADH.  We discussed a seed guided lumpectomy and excisional biopsy.  Procedure: After informed consent was obtained she was taken to the operating room.  She was given antibiotics.  SCDs were placed.  The seeds had been placed prior to beginning I had these mammograms available in the operating room.  She was placed under general anesthesia without complication.  She was prepped and draped in the standard sterile surgical fashion.  A surgical timeout was then performed.  I made an inframammary incision to hide the scar later.  I then used the neoprobe to dissected the inferior seed.  I remove this as well as the surrounding tissue.  This was done to get a clear margin.  Mammogram confirmed  removal of the seed and the clip.  I thought I was close to the medial margin so I remove this.  I was also close to the superior margin which ends up being where the other seed was.  I remove the other seed and clip and this was the superior margin of the lumpectomy.  I confirmed removal of both seeds and both clips.  Hemostasis was then obtained.  I placed clips in the cavity.  I then closed this with 2-0 Vicryl.  The skin was closed with 3-0 Vicryl and 4 Monocryl.  Glue and Steri-Strips were applied.  She tolerated this well was extubated and transferred recovery stable.

## 2023-03-10 NOTE — Discharge Instructions (Signed)
[facility] Office Phone Number [phone]  POST OP INSTRUCTIONS Take 400 mg of ibuprofen every 8 hours or 650 mg tylenol every 6 hours for next 72 hours then as needed. Use ice several times daily also.  A prescription for pain medication may be given to you upon discharge.  Take your pain medication as prescribed, if needed.  If narcotic pain medicine is not needed, then you may take acetaminophen (Tylenol), naprosyn (Alleve) or ibuprofen (Advil) as needed. Take your usually prescribed medications unless otherwise directed If you need a refill on your pain medication, please contact your pharmacy.  They will contact our office to request authorization.  Prescriptions will not be filled after 5pm or on week-ends. You should eat very light the first 24 hours after surgery, such as soup, crackers, pudding, etc.  Resume your normal diet the day after surgery. Most patients will experience some swelling and bruising in the breast.  Ice packs and a good support bra will help.  Wear the breast binder provided or a sports bra for 72 hours day and night.  After that wear a sports bra during the day until you return to the office. Swelling and bruising can take several days to resolve.  It is common to experience some constipation if taking pain medication after surgery.  Increasing fluid intake and taking a stool softener will usually help or prevent this problem from occurring.  A mild laxative (Milk of Magnesia or Miralax) should be taken according to package directions if there are no bowel movements after 48 hours. I used skin glue on the incision, you may shower in 24 hours.  The glue will flake off over the next 2-3 weeks.  Any sutures or staples will be removed at the office during your follow-up visit. ACTIVITIES:  You may resume regular daily activities (gradually increasing) beginning the next day.  Wearing a good support bra or sports bra minimizes pain and swelling.  You may have  sexual intercourse when it is comfortable. You may drive when you no longer are taking prescription pain medication, you can comfortably wear a seatbelt, and you can safely maneuver your car and apply brakes. RETURN TO WORK:  ______________________________________________________________________________________ You should see your doctor in the office for a follow-up appointment approximately two weeks after your surgery.  Your doctor's nurse will typically make your follow-up appointment when she calls you with your pathology report.  Expect your pathology report 3-4 business days after your surgery.  You may call to check if you do not hear from us after three days. OTHER INSTRUCTIONS: _______________________________________________________________________________________________ _____________________________________________________________________________________________________________________________________ _____________________________________________________________________________________________________________________________________ _____________________________________________________________________________________________________________________________________  WHEN TO CALL DR Rami Budhu: Fever over 101.0 Nausea and/or vomiting. Extreme swelling or bruising. Continued bleeding from incision. Increased pain, redness, or drainage from the incision.  The clinic staff is available to answer your questions during regular business hours.  Please don't hesitate to call and ask to speak to one of the nurses for clinical concerns.  If you have a medical emergency, go to the nearest emergency room or call 911.  A surgeon from [facility] is always on call at the hospital.  For further questions, please visit [email or web address]  

## 2023-03-10 NOTE — Interval H&P Note (Signed)
History and Physical Interval Note:  03/10/2023 8:44 AM  Amy Vang  has presented today for surgery, with the diagnosis of RIGHT BREAST DCIS.  The various methods of treatment have been discussed with the patient and family. After consideration of risks, benefits and other options for treatment, the patient has consented to  Procedure(s): RIGHT BREAST LUMPECTOMY WITH RADIOACTIVE SEED LOCALIZATION (Right) RADIOACTIVE SEED GUIDED EXCISIONAL RIGHT BREAST BIOPSY (Right) as a surgical intervention.  The patient's history has been reviewed, patient examined, no change in status, stable for surgery.  I have reviewed the patient's chart and labs.  Questions were answered to the patient's satisfaction.     Amy Vang

## 2023-03-10 NOTE — Transfer of Care (Signed)
Immediate Anesthesia Transfer of Care Note  Patient: Amy Vang  Procedure(s) Performed: RIGHT BREAST LUMPECTOMY WITH RADIOACTIVE SEED LOCALIZATION (Right: Breast) RADIOACTIVE SEED GUIDED EXCISIONAL RIGHT BREAST BIOPSY (Right: Breast)  Patient Location: PACU  Anesthesia Type:General  Level of Consciousness: awake, oriented, and drowsy  Airway & Oxygen Therapy: Patient Spontanous Breathing and Patient connected to nasal cannula oxygen  Post-op Assessment: Report given to RN and Post -op Vital signs reviewed and stable  Post vital signs: Reviewed and stable  Last Vitals:  Vitals Value Taken Time  BP 162/71 03/10/23 1010  Temp    Pulse 71 03/10/23 1012  Resp 12 03/10/23 1012  SpO2 96 % 03/10/23 1012  Vitals shown include unvalidated device data.  Last Pain:  Vitals:   03/10/23 0758  TempSrc:   PainSc: 0-No pain         Complications: No notable events documented.

## 2023-03-11 ENCOUNTER — Ambulatory Visit: Payer: HMO | Admitting: Internal Medicine

## 2023-03-11 ENCOUNTER — Encounter (HOSPITAL_COMMUNITY): Payer: Self-pay | Admitting: General Surgery

## 2023-03-11 DIAGNOSIS — D0511 Intraductal carcinoma in situ of right breast: Secondary | ICD-10-CM | POA: Diagnosis not present

## 2023-03-11 NOTE — Anesthesia Postprocedure Evaluation (Signed)
Anesthesia Post Note  Patient: Amy Vang  Procedure(s) Performed: RIGHT BREAST LUMPECTOMY WITH RADIOACTIVE SEED LOCALIZATION (Right: Breast) RADIOACTIVE SEED GUIDED EXCISIONAL RIGHT BREAST BIOPSY (Right: Breast)     Patient location during evaluation: PACU Anesthesia Type: General Level of consciousness: awake and alert Pain management: pain level controlled Vital Signs Assessment: post-procedure vital signs reviewed and stable Respiratory status: spontaneous breathing, nonlabored ventilation, respiratory function stable and patient connected to nasal cannula oxygen Cardiovascular status: blood pressure returned to baseline and stable Postop Assessment: no apparent nausea or vomiting Anesthetic complications: no   No notable events documented.  Last Vitals:  Vitals:   03/10/23 1030 03/10/23 1045  BP: (!) 162/72 (!) 164/75  Pulse: 70 75  Resp: 17 13  Temp:  36.7 C  SpO2: 97% 95%    Last Pain:  Vitals:   03/10/23 1045  TempSrc:   PainSc: 3                  Kennieth Rad

## 2023-03-12 LAB — SURGICAL PATHOLOGY

## 2023-03-15 ENCOUNTER — Encounter: Payer: Self-pay | Admitting: *Deleted

## 2023-03-15 DIAGNOSIS — D0511 Intraductal carcinoma in situ of right breast: Secondary | ICD-10-CM

## 2023-03-16 ENCOUNTER — Inpatient Hospital Stay: Payer: HMO | Attending: Oncology | Admitting: Oncology

## 2023-03-16 ENCOUNTER — Encounter: Payer: Self-pay | Admitting: Oncology

## 2023-03-16 ENCOUNTER — Inpatient Hospital Stay: Payer: HMO

## 2023-03-16 VITALS — BP 121/67 | HR 64 | Temp 97.0°F | Resp 18 | Ht 64.0 in | Wt 174.0 lb

## 2023-03-16 DIAGNOSIS — Z79899 Other long term (current) drug therapy: Secondary | ICD-10-CM | POA: Diagnosis not present

## 2023-03-16 DIAGNOSIS — I10 Essential (primary) hypertension: Secondary | ICD-10-CM | POA: Diagnosis not present

## 2023-03-16 DIAGNOSIS — D0511 Intraductal carcinoma in situ of right breast: Secondary | ICD-10-CM | POA: Insufficient documentation

## 2023-03-16 DIAGNOSIS — Z7189 Other specified counseling: Secondary | ICD-10-CM

## 2023-03-16 NOTE — Progress Notes (Signed)
Hematology/Oncology Consult note [facility] [phone] [phone]  Patient Care Team: Merri Brunette, MD as PCP - General (Family Medicine)   Name of the patient: Amy Vang  [id number]  [birth year]    Reason for referral-new diagnosis of right breast DCIS   Referring physician-Dr. Katrinka Blazing  Date of visit: 03/16/23   History of presenting illness- Patient is a 77 year old female who underwent a routine bilateral screening mammogram in February 2024 which showed possible calcifications in the right breast.  This was followed by diagnostic mammogram which showed group of calcifications in the lateral inferior right breast spanning up to 19 mm.  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  DCIS measures 4 mm in greatest dimension additional medial and superior margin negative for carcinoma margins were negative for DCIS but close at 0.75 mm from the posterior margin.  Patient referred for further management  No prior family history of personal history of breast cancer.  She is recovering from her lumpectomy well..  She still has some soreness at the site of surgery  ECOG PS- 0  Pain scale- 2   Review of systems- Review of Systems  Constitutional:  Negative for chills, fever, malaise/fatigue and weight loss.  HENT:  Negative for congestion, ear discharge and nosebleeds.   Eyes:  Negative for blurred vision.  Respiratory:  Negative for cough, hemoptysis, sputum production, shortness of breath and wheezing.   Cardiovascular:  Negative for chest pain, palpitations, orthopnea  and claudication.  Gastrointestinal:  Negative for abdominal pain, blood in stool, constipation, diarrhea, heartburn, melena, nausea and vomiting.  Genitourinary:  Negative for dysuria, flank pain, frequency, hematuria and urgency.  Musculoskeletal:  Negative for back pain, joint pain and myalgias.  Skin:  Negative for rash.  Neurological:  Negative for dizziness, tingling, focal weakness, seizures, weakness and headaches.  Endo/Heme/Allergies:  Does not bruise/bleed easily.  Psychiatric/Behavioral:  Negative for depression and suicidal ideas. The patient does not have insomnia.     Allergies  Allergen Reactions   Nsaids      Abdominal Pain   Ambien [Zolpidem] Other (See Comments)    groggy   Hydroxyzine Hcl     UNSPECIFIED REACTION    Promethazine Hcl     hyperactivity   Lactose Intolerance (Gi) Other (See Comments)    bloating   Septra [Sulfamethoxazole-Trimethoprim] Rash    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   Abnormal vaginal bleeding 12/17/2021   Allergic rhinitis 12/17/2021   Adjustment disorder 12/17/2021   Cholelithiasis without obstruction 12/17/2021   Constipation 12/17/2021   Degeneration of lumbar intervertebral disc 12/17/2021   Diverticulosis of sigmoid colon 12/17/2021   Fatty liver 12/17/2021   Functional abdominal pain syndrome 12/17/2021   Gastro-esophageal reflux disease without esophagitis 12/17/2021   Hypo-osmolality and hyponatremia 12/17/2021   Internal hemorrhoids 12/17/2021   Irritable bowel syndrome without diarrhea 12/17/2021   Lymphocytosis 12/17/2021   Nausea and vomiting 12/17/2021   Overactive bladder 12/17/2021   Personal history of colonic polyps 12/17/2021   Personal history of transient ischemic attack (TIA), and cerebral infarction without residual deficits 12/17/2021  Insomnia 12/17/2021   Pure hypercholesterolemia 12/17/2021   Restless legs syndrome  12/17/2021   Rosacea 12/17/2021   Sleep-wake schedule disorder, 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   Piriformis syndrome of right side 01/01/2021   Lower limb pain, inferior, right 09/19/2020   Lumbosacral radiculitis 09/19/2020   Body mass index (BMI) 25.0-25.9, adult 06/17/2020   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   Annular tear of lumbar disc 01/24/2020   Acute back pain with sciatica 12/28/2019   Encounter for orthopedic follow-up care 01/17/2019   Partial thickness rotator cuff tear 11/07/2018   Acquired trigger finger 10/28/2018   Pain in joint of right shoulder 05/06/2018   Hoarseness 12/22/2017   Cervical spondylosis with radiculopathy 10/25/2017   Neck pain 08/31/2017   Other cervical disc displacement, unspecified cervical region 08/31/2017   Sinusitis, chronic 11/18/2016   Deviated septum 09/18/2016   Dyspnea and respiratory abnormality 05/21/2016   Wheeze 05/21/2016   Chronic throat clearing 05/21/2016   Pulmonary HTN 04/23/2016   Moderate persistent asthma 11/12/2015   Allergic rhinoconjunctivitis 11/12/2015   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   SOB (shortness of breath) 03/06/2015   OSA (obstructive sleep apnea) 01/01/2014   Benign essential HTN 01/01/2014   Obesity 01/01/2014     Past Medical History:  Diagnosis Date   Allergic rhinitis    Allergy    seasonal   Anemia    Anxiety    Cancer    Cataract    removed bilateraly   Chronic constipation    COVID-19 08/2019   Degenerative disc disease, cervical    Essential hypertension    GERD (gastroesophageal reflux disease) 1957   H/O bronchitis    History of echocardiogram    in epic 04-21-2019,  normal w/ ef 60-65% and mild mitral regurg, no stenosis or restriction   History of TIAs    per pt age 74;  2008;  05/ 2013;  pt stated no residual was from sleep doctor more than likely sleep deprived than actual tia   Motion sickness    cars   Nocturia    OSA on CPAP    Severe ,, CPAP 12 MM H2O- Dr Mayford Knife   PONV  (postoperative nausea and vomiting)    Restless leg syndrome    Rosacea    Seasonal asthma    no inhaler   Sleep apnea    uses CPAP   Stroke 1985   TIAx2     Past Surgical History:  Procedure Laterality Date   ANTERIOR CERVICAL DECOMP/DISCECTOMY FUSION N/A 10/25/2017   Procedure: CERVICAL SIX- CERVICAL SEVEN ANTERIOR CERVICAL DECOMPRESSION/DISCECTOMY FUSION, INTERBODY PROSTHESIS AND ANTERIOR PLATING;  Surgeon: Tressie Stalker, MD;  Location: [facility] OR;  Service: Neurosurgery;  Laterality: N/A;  CERVICAL 6- CERVICAL 7 ANTERIOR CERVICAL DECOMPRESSION/DISCECTOMY FUSION, INTERBODY PROSTHESIS AND ANTERIOR PLATING   BACK SURGERY  04/2022   BILATERAL BENIGN BREAST BX'S  20 YRS AGO   BREAST BIOPSY Right    benign   BREAST BIOPSY Right 01/27/2023   MM RT BREAST BX W LOC DEV 1ST LESION IMAGE BX SPEC STEREO GUIDE 01/27/2023 [facility]   BREAST BIOPSY Right 02/05/2023   MM RT BREAST BX W LOC DEV 1ST LESION IMAGE BX SPEC STEREO GUIDE 02/05/2023 [facility]   BREAST BIOPSY  03/09/2023   MM RT RADIOACTIVE SEED LOC MAMMO GUIDE 03/09/2023 [facility]   BREAST BIOPSY  03/09/2023   MM RT RADIOACTIVE SEED EA ADD LESION LOC MAMMO GUIDE 03/09/2023 [facility]   BREAST EXCISIONAL BIOPSY Bilateral over 10 years  ago   benign   BREAST LUMPECTOMY WITH RADIOACTIVE SEED LOCALIZATION Right 03/10/2023   Procedure: RIGHT BREAST LUMPECTOMY WITH RADIOACTIVE SEED LOCALIZATION;  Surgeon: Emelia Loron, MD;  Location: [facility] OR;  Service: General;  Laterality: Right;   CARDIAC CATHETERIZATION N/A 08/13/2016   Procedure: Right Heart Cath;  Surgeon: Laurey Morale, MD;  Location: [facility] INVASIVE CV LAB;  Service: Cardiovascular;  Laterality: N/A;    normal study, no evidence pulm HTN   CARPAL TUNNEL RELEASE  2009   BILATERAL   CATARACT EXTRACTION W/PHACO Right 02/27/2020   Procedure: CATARACT EXTRACTION PHACO AND INTRAOCULAR LENS PLACEMENT (IOC) RIGHT 3.60 00:23.9;  Surgeon: Galen Manila, MD;   Location: [facility];  Service: Ophthalmology;  Laterality: Right;  sleep apnea   CATARACT EXTRACTION W/PHACO Left 05/21/2020   Procedure: CATARACT EXTRACTION PHACO AND INTRAOCULAR LENS PLACEMENT (IOC) LEFT 3.69 00:30.9;  Surgeon: Galen Manila, MD;  Location: [facility];  Service: Ophthalmology;  Laterality: Left;  sleep apnea   CHOLECYSTECTOMY N/A 03/25/2021   Procedure: LAPAROSCOPIC CHOLECYSTECTOMY;  Surgeon: Emelia Loron, MD;  Location: [facility] OR;  Service: General;  Laterality: N/A;   CYSTOCELE REPAIR  12/11/2011   Procedure: ANTERIOR REPAIR (CYSTOCELE);  Surgeon: Garnett Farm, MD;  Location: [facility];  Service: Urology;  Laterality: N/A;  1 1/2 hour requested for case  Anterior repair and mid Urethral Sling   INCISIONAL HERNIA REPAIR N/A 12/24/2021   Procedure: LAP ASSISTED INCISIONAL HERNIA REPAIR WITH MESH;  Surgeon: Emelia Loron, MD;  Location: [facility] OR;  Service: General;  Laterality: N/A;  GEN & TAP BLOCK RNFA   MASS EXCISION N/A 07/24/2019   Procedure: EXCISION OF VAGINAL FOREIGN BODY;  Surgeon: Ihor Gully, MD;  Location: [facility];  Service: Urology;  Laterality: N/A;   NASAL SEPTOPLASTY W/ TURBINOPLASTY Bilateral 11/18/2016   Procedure: NASAL SEPTOPLASTY WITH BILATERAL TURBINATE REDUCTION;  Surgeon: Osborn Coho, MD;  Location: [facility] OR;  Service: ENT;  Laterality: Bilateral;   NONINVASIVE VASCULAR CAROTID STUDY  09/14/2007   BILATERAL MILD MIX PLAQUE THROUGHOUT, NO SIGNIFICANT BILATERAL ICA STENOSIS   PUBOVAGINAL SLING  12/11/2011   Procedure: Leonides Grills;  Surgeon: Garnett Farm, MD;  Location: [facility];  Service: Urology;  Laterality: N/A;   PULLEY RELEASE RIGHT THUMB  2009   RADIOACTIVE SEED GUIDED EXCISIONAL BREAST BIOPSY Right 03/10/2023   Procedure: RADIOACTIVE SEED GUIDED EXCISIONAL RIGHT BREAST BIOPSY;  Surgeon: Emelia Loron, MD;  Location: [facility] OR;  Service: General;   Laterality: Right;   SHOULDER ARTHROSCOPY DISTAL CLAVICLE EXCISION AND OPEN ROTATOR CUFF REPAIR Right 01-17-2019   dr supple    SINUS ENDO WITH FUSION Bilateral 11/18/2016   Procedure: BILATERAL ENDOSCOPIC SINUS SURGERY;  Surgeon: Osborn Coho, MD;  Location: [facility] OR;  Service: ENT;  Laterality: Bilateral;   UMBILICAL HERNIA REPAIR N/A 03/25/2021   Procedure: Primary REPAIR UMBILICAL HERNIA;  Surgeon: Emelia Loron, MD;  Location: [facility] OR;  Service: General;  Laterality: N/A;   UPPER GASTROINTESTINAL ENDOSCOPY     VAGINAL HYSTERECTOMY  AGE 60    Social History   Socioeconomic History   Marital status: Married    Spouse name: Not on file   Number of children: 2   Years of education: Not on file   Highest education level: Not on file  Occupational History   Not on file  Tobacco Use   Smoking status: Never   Smokeless tobacco: Never  Vaping Use   Vaping Use: Never used  Substance  and Sexual Activity   Alcohol use: No   Drug use: No   Sexual activity: Not on file  Other Topics Concern   Not on file  Social History Narrative   Lives with husband.    Retired Environmental health practitioner   Has 2 children.   Social Determinants of Health   Financial Resource Strain: Not on file  Food Insecurity: Not on file  Transportation Needs: Not on file  Physical Activity: Not on file  Stress: Not on file  Social Connections: Not on file  Intimate Partner Violence: Not on file     Family History  Adopted: Yes  Problem Relation Age of Onset   Diabetes Mother    Stroke Mother    Alzheimer's disease Mother    Hypertension Sister    Diabetes Sister    Heart attack Brother    Leukemia Brother    CAD Brother    Diabetes Brother    Colon cancer Maternal Aunt    Stroke Paternal Grandmother    Heart attack Paternal Grandfather    Esophageal cancer Neg Hx    Rectal cancer Neg Hx    Stomach cancer Neg Hx      Current Outpatient Medications:    acetaminophen (TYLENOL) 650 MG  CR tablet, Take 650 mg by mouth every 8 (eight) hours as needed for pain., Disp: , Rfl:    citalopram (CELEXA) 10 MG tablet, Take 5 mg by mouth in the morning and at bedtime., Disp: , Rfl:    clonazePAM (KLONOPIN) 0.5 MG tablet, Take 0.5 mg by mouth at bedtime., Disp: , Rfl:    cyclobenzaprine (FLEXERIL) 10 MG tablet, Take 1 tablet (10 mg total) by mouth 3 (three) times daily as needed for muscle spasms. (Patient taking differently: Take 10 mg by mouth at bedtime as needed for muscle spasms.), Disp: 50 tablet, Rfl: 1   dicyclomine (BENTYL) 10 MG capsule, TAKE 1 CAPSULE (10 MG TOTAL) BY MOUTH 4 (FOUR) TIMES DAILY AS NEEDED FOR SPASMS., Disp: 540 capsule, Rfl: 0   estradiol (ESTRACE) 0.1 MG/GM vaginal cream, Apply a pea-sized amount to fingertip and wipe in vaginal introitus twice weekly (Patient not taking: Reported on 02/26/2023), Disp: 42.5 g, Rfl: 1   famotidine (PEPCID) 40 MG tablet, Take 40 mg by mouth at bedtime., Disp: , Rfl:    fexofenadine (ALLEGRA) 180 MG tablet, Take 180 mg by mouth in the morning., Disp: , Rfl:    fluticasone (FLONASE) 50 MCG/ACT nasal spray, Place 1 spray into both nostrils daily as needed for allergies or rhinitis., Disp: , Rfl:    guaiFENesin (MUCINEX) 600 MG 12 hr tablet, Take 600 mg by mouth in the morning., Disp: , Rfl:    hydrochlorothiazide (HYDRODIURIL) 25 MG tablet, Take 1 tablet (25 mg total) by mouth daily., Disp:  , Rfl:    losartan (COZAAR) 25 MG tablet, Take 25 mg by mouth in the morning., Disp: , Rfl:    methocarbamol (ROBAXIN) 500 MG tablet, Take 500 mg by mouth every 8 (eight) hours as needed for muscle spasms., Disp: , Rfl:    ondansetron (ZOFRAN) 4 MG tablet, Take 4 mg by mouth every 8 (eight) hours as needed for nausea or vomiting., Disp: , Rfl:    pantoprazole (PROTONIX) 40 MG tablet, TAKE 1 TABLET BY MOUTH TWICE A DAY, Disp: 180 tablet, Rfl: 2   potassium chloride (MICRO-K) 10 MEQ CR capsule, Take 20 mEq by mouth in the morning., Disp: , Rfl:     Probiotic Product (  ALIGN PO), Take 1 capsule by mouth in the morning., Disp: , Rfl:    promethazine (PHENERGAN) 25 MG tablet, Take 1 tablet (25 mg total) by mouth every 6 (six) hours as needed., Disp: 30 tablet, Rfl: 0   sodium chloride (OCEAN) 0.65 % SOLN nasal spray, Place 1 spray into both nostrils 3 (three) times daily as needed for congestion., Disp: , Rfl:    traMADol (ULTRAM) 50 MG tablet, Take 50 mg by mouth every 6 (six) hours as needed for moderate pain., Disp: , Rfl:    traMADol (ULTRAM) 50 MG tablet, Take 1 tablet (50 mg total) by mouth every 6 (six) hours as needed., Disp: 10 tablet, Rfl: 0   traZODone (DESYREL) 50 MG tablet, Take 50 mg by mouth at bedtime., Disp: , Rfl:    Physical exam: There were no vitals filed for this visit. Physical Exam Cardiovascular:     Rate and Rhythm: Normal rate and regular rhythm.     Heart sounds: Normal heart sounds.  Pulmonary:     Effort: Pulmonary effort is normal.     Breath sounds: Normal breath sounds.  Abdominal:     General: Bowel sounds are normal.     Palpations: Abdomen is soft.  Skin:    General: Skin is warm and dry.  Neurological:     Mental Status: She is alert and oriented to person, place, and time.   Breast exam: Patient is s/p left lumpectomy with a well-healed surgical scar.  Steri-Strips in place in the inframammary region of the right breast.  Some bruising noted at the site of prior biopsy.  Right nipple appears inverted since surgery.  No palpable masses in the left breast.       Latest Ref Rng & Units 03/03/2023    2:00 PM  CMP  Glucose 70 - 99 mg/dL 161   BUN 8 - 23 mg/dL 7   Creatinine 0.96 - 0.45 mg/dL 4.09   Sodium 811 - 914 mmol/L 135   Potassium 3.5 - 5.1 mmol/L 3.8   Chloride 98 - 111 mmol/L 95   CO2 22 - 32 mmol/L 29   Calcium 8.9 - 10.3 mg/dL 9.1       Latest Ref Rng & Units 03/03/2023    2:00 PM  CBC  WBC 4.0 - 10.5 K/uL 5.7   Hemoglobin 12.0 - 15.0 g/dL 78.2   Hematocrit 95.6 - 46.0 % 37.5    Platelets 150 - 400 K/uL 231     No images are attached to the encounter.  MM Breast Surgical Specimen  Result Date: 03/10/2023 CLINICAL DATA:  Status post seed localized lumpectomy of the RIGHT breast. EXAM: SPECIMEN RADIOGRAPH OF THE RIGHT BREAST COMPARISON:  Previous exam(s). FINDINGS: Status post excision of the right breast. The radioactive seed and coil shaped biopsy marker clip are present, completely intact, and were marked for pathology. IMPRESSION: Specimen radiograph of the RIGHT breast. Electronically Signed   By: Norva Pavlov M.D.   On: 03/10/2023 10:03  MM Breast Surgical Specimen  Result Date: 03/10/2023 CLINICAL DATA:  Status post seed localized RIGHT lumpectomy. EXAM: SPECIMEN RADIOGRAPH OF THE RIGHT BREAST COMPARISON:  Previous exam(s). FINDINGS: Status post excision of the right breast. The radioactive seed and venous shaped biopsy marker clip are present, completely intact, and were marked for pathology. IMPRESSION: Specimen radiograph of the right breast. Electronically Signed   By: Norva Pavlov M.D.   On: 03/10/2023 10:01  MM RT RADIOACTIVE SEED LOC MAMMO GUIDE  Result Date: 03/09/2023 CLINICAL DATA:  Localization prior to surgery EXAM: MAMMOGRAPHIC GUIDED RADIOACTIVE SEED LOCALIZATION OF THE RIGHT BREAST COMPARISON:  Previous exam(s). FINDINGS: Patient presents for radioactive seed localization prior to surgery. I met with the patient and we discussed the procedure of seed localization including benefits and alternatives. We discussed the high likelihood of a successful procedure. We discussed the risks of the procedure including infection, bleeding, tissue injury and further surgery. We discussed the low dose of radioactivity involved in the procedure. Informed, written consent was given. The usual time-out protocol was performed immediately prior to the procedure. Using mammographic guidance, sterile technique, 1% lidocaine and an I-125 radioactive seed, the biopsy  clip and residual calcifications at the site of DCIS was localized using a superior approach. The follow-up mammogram images confirm the seed in the expected location and were marked for the surgeon. Follow-up survey of the patient confirms presence of the radioactive seed. Order number of I-125 seed:  [id number]. Total activity:  0.254 millicurie reference Date: February 05, 2023 Patient presents for radioactive seed localization prior to surgery. I met with the patient and we discussed the procedure of seed localization including benefits and alternatives. We discussed the high likelihood of a successful procedure. We discussed the risks of the procedure including infection, bleeding, tissue injury and further surgery. We discussed the low dose of radioactivity involved in the procedure. Informed, written consent was given. The usual time-out protocol was performed immediately prior to the procedure. Using mammographic guidance, sterile technique, 1% lidocaine and an I-125 radioactive seed, the estimated site of biopsied atypia was localized using a lateral approach. The follow-up mammogram images confirm the seed in the expected location and were marked for the surgeon. Follow-up survey of the patient confirms presence of the radioactive seed. Order number of I-125 seed:  [id number]. Total activity:  0.254 millicurie reference Date: February 05, 2023 The patient tolerated the procedure well and was released from the [facility]. She was given instructions regarding seed removal. IMPRESSION: Radioactive seed localization right breast. No apparent complications. Electronically Signed   By: Gerome Sam III M.D.   On: 03/09/2023 15:42  MM RT RADIO SEED EA ADD LESION LOC MAMMO  Result Date: 03/09/2023 CLINICAL DATA:  Localization prior to surgery EXAM: MAMMOGRAPHIC GUIDED RADIOACTIVE SEED LOCALIZATION OF THE RIGHT BREAST COMPARISON:  Previous exam(s). FINDINGS: Patient presents for radioactive seed localization prior  to surgery. I met with the patient and we discussed the procedure of seed localization including benefits and alternatives. We discussed the high likelihood of a successful procedure. We discussed the risks of the procedure including infection, bleeding, tissue injury and further surgery. We discussed the low dose of radioactivity involved in the procedure. Informed, written consent was given. The usual time-out protocol was performed immediately prior to the procedure. Using mammographic guidance, sterile technique, 1% lidocaine and an I-125 radioactive seed, the biopsy clip and residual calcifications at the site of DCIS was localized using a superior approach. The follow-up mammogram images confirm the seed in the expected location and were marked for the surgeon. Follow-up survey of the patient confirms presence of the radioactive seed. Order number of I-125 seed:  [id number]. Total activity:  0.254 millicurie reference Date: February 05, 2023 Patient presents for radioactive seed localization prior to surgery. I met with the patient and we discussed the procedure of seed localization including benefits and alternatives. We discussed the high likelihood of a successful procedure. We discussed the risks of the procedure including infection, bleeding,  tissue injury and further surgery. We discussed the low dose of radioactivity involved in the procedure. Informed, written consent was given. The usual time-out protocol was performed immediately prior to the procedure. Using mammographic guidance, sterile technique, 1% lidocaine and an I-125 radioactive seed, the estimated site of biopsied atypia was localized using a lateral approach. The follow-up mammogram images confirm the seed in the expected location and were marked for the surgeon. Follow-up survey of the patient confirms presence of the radioactive seed. Order number of I-125 seed:  [id number]. Total activity:  0.254 millicurie reference Date: February 05, 2023 The  patient tolerated the procedure well and was released from the [facility]. She was given instructions regarding seed removal. IMPRESSION: Radioactive seed localization right breast. No apparent complications. Electronically Signed   By: Gerome Sam III M.D.   On: 03/09/2023 15:42   Assessment and plan- Patient is a 77 y.o. female with history of right breast DCIS ER positive referred for further management  I have reviewed mammogram images independently and discussed mammogram as well as biopsy and final pathology with the patient in detail.  Patient was found to have 2 sets of calcifications 1 was positive for DCIS and the other 1 was atypical ductal hyperplasia.  Patient had a right lumpectomy which showed 4 mm residual DCIS.  Margins were close at 0.75 mm posterior margin but negative.  Previously noted ADH on biopsy was also encompassed with the superior margin of the resection.  Ideally for DCIS we would need a 2 mm clear margin.  Patient will be discussing this further with Dr. Dwain Sarna and Dr. Mitzi Hansen.  We discussed the role of adjuvant radiation as well as adjuvant endocrine therapy in DCIS.  In patients with low-grade DCIS more than 76 years of age adjuvant radiation therapy may not be needed.  However given that she had close margins for her DCIS and overall has a good performance status that is still a consideration.  As per the [facility] DCIS algorithm her 5 and 10-year risk of recurrence without radiation and without endocrine therapy is at 11 and 18% respectively.  With radiation this will come down to 4 and 7% and with the addition of endocrine therapy comes down further to 2 and 3% respectively.  I discussed with the patient all this in great detail.  Also discussed the role of endocrine therapy in DCIS which is given for 5 years.  Discussed risks and benefits of both tamoxifen and aromatase inhibitors including all but not limited to fatigue, hot flashes, arthralgias and worsening bone  health as well as hyperlipidemia and adverse effect and cardiovascular profile which is more seen with AI.  Risk of DVT cataracts and uterine cancer which can be seen with tamoxifen.  Patient would like to think about her options before deciding if she wants to proceed with endocrine therapy or not.Patient has baseline osteopenia and her last bone density scan was done in August 2023 which showed a T-score of -1.5 at the left hip consistent with osteopenia.  She has had a prior pathological fracture of the vertebra as well.  She has had some trouble tolerating calcium and vitamin D in the past due to GI issues as well.  I will see her back in about 1 month's time potentially after she completes radiation therapy to discuss if she would like to try endocrine therapy.  Patient verbalized understanding of the plan   Cancer Staging  Ductal carcinoma in situ (DCIS) of right breast Staging  form: Breast, AJCC 8th Edition - Clinical stage from 03/16/2023: Stage 0 (cTis (DCIS), cN0, cM0, G2, ER+, PR+, HER2: Unknown) - Signed by Creig Hines, MD on 03/16/2023 Stage prefix: Initial diagnosis Nuclear grade: G2 Histologic grading system: 3 grade system     Thank you for this kind referral and the opportunity to participate in the care of this patient   Visit Diagnosis 1. Ductal carcinoma in situ (DCIS) of right breast   2. Goals of care, counseling/discussion     Dr. Owens Shark, MD, MPH [facility] at [facility] [phone] 03/16/2023

## 2023-03-17 DIAGNOSIS — R6884 Jaw pain: Secondary | ICD-10-CM | POA: Diagnosis not present

## 2023-03-17 NOTE — Progress Notes (Signed)
New Breast Cancer Diagnosis: Right Breast LOQ  Did patient present with symptoms (if so, please note symptoms) or screening mammography?: She was noted to have a 19 mm area of calcifications in the lateral inferior right breast and a group in the central inferior right breast appeared to be stable.     Location and Extent of disease :right breast. Located in the lower outer quadrant, measured 4 mm in greatest dimension. Adenopathy no.  Histology per Pathology Report: grade 2, DCIS 03/10/2023  Receptor Status: ER(positive), PR (positive), Her2-neu (), Ki-(%)   Surgeon and surgical plan, if any:  Dr. Dwain Sarna 03/10/2023 - Right Breast Lumpectomy with radioactive seed localization -Radioactive seed guided excisional Right Breast Biopsy  Medical oncologist, treatment if any:    Family History of Breast/Ovarian/Prostate Cancer:   Lymphedema issues, if any:      Pain issues, if any:     SAFETY ISSUES: Prior radiation?  Pacemaker/ICD?  Possible current pregnancy? Hysterectomy Is the patient on methotrexate?   Current Complaints / other details:

## 2023-03-18 ENCOUNTER — Ambulatory Visit
Admission: RE | Admit: 2023-03-18 | Discharge: 2023-03-18 | Disposition: A | Discharge status: Home / Self Care | Payer: HMO | Source: Ambulatory Visit | Attending: Radiation Oncology | Admitting: Radiation Oncology

## 2023-03-18 ENCOUNTER — Encounter: Payer: Self-pay | Admitting: Radiation Oncology

## 2023-03-18 VITALS — BP 131/66 | HR 78 | Temp 98.1°F | Resp 18 | Wt 175.2 lb

## 2023-03-18 DIAGNOSIS — Z8616 Personal history of COVID-19: Secondary | ICD-10-CM | POA: Diagnosis not present

## 2023-03-18 DIAGNOSIS — D649 Anemia, unspecified: Secondary | ICD-10-CM | POA: Insufficient documentation

## 2023-03-18 DIAGNOSIS — K219 Gastro-esophageal reflux disease without esophagitis: Secondary | ICD-10-CM | POA: Insufficient documentation

## 2023-03-18 DIAGNOSIS — I10 Essential (primary) hypertension: Secondary | ICD-10-CM | POA: Insufficient documentation

## 2023-03-18 DIAGNOSIS — G4730 Sleep apnea, unspecified: Secondary | ICD-10-CM | POA: Diagnosis not present

## 2023-03-18 DIAGNOSIS — Z79899 Other long term (current) drug therapy: Secondary | ICD-10-CM | POA: Insufficient documentation

## 2023-03-18 DIAGNOSIS — D0511 Intraductal carcinoma in situ of right breast: Secondary | ICD-10-CM | POA: Diagnosis not present

## 2023-03-18 DIAGNOSIS — Z800 Family history of malignant neoplasm of digestive organs: Secondary | ICD-10-CM | POA: Insufficient documentation

## 2023-03-18 DIAGNOSIS — M5030 Other cervical disc degeneration, unspecified cervical region: Secondary | ICD-10-CM | POA: Diagnosis not present

## 2023-03-18 DIAGNOSIS — Z170 Estrogen receptor positive status [ER+]: Secondary | ICD-10-CM | POA: Diagnosis not present

## 2023-03-18 DIAGNOSIS — Z8673 Personal history of transient ischemic attack (TIA), and cerebral infarction without residual deficits: Secondary | ICD-10-CM | POA: Insufficient documentation

## 2023-03-18 NOTE — Progress Notes (Signed)
Radiation Oncology         [phone] ________________________________  Name: Amy Vang        MRN: [id number]  Date of Service: 03/18/2023 DOB: 06/11/1946  WU:[id number], Sonny Masters, MD  Emelia Loron, MD     REFERRING PHYSICIAN: Emelia Loron, MD   DIAGNOSIS: The encounter diagnosis was Ductal carcinoma in situ (DCIS) of right breast.   HISTORY OF PRESENT ILLNESS: Amy Vang is a 77 y.o. female seen at the request of Dr. Dwain Sarna for a new diagnosis of right breast DCIS.The patient was originally found to have a screening detected area of calcifications in the right breast, by diagnostic imaging there was a group of calcifications in the inferior right breast measuring approximately 1.9 cm.  She underwent a stereotactic biopsy on 01/27/2023 that showed intermediate grade DCIS with necrosis and calcifications.  Her cancer was ER/PR positive.  There were also calcifications seen during her stereotactic biopsy in the 6 o'clock position and she returned for a second procedure on 02/05/2023 which showed atypical ductal hyperplasia with focal necrosis and calcifications.  She subsequently underwent a right lumpectomy on 03/10/2023 that showed a intermediate grade residual ductal carcinoma in situ measuring 4 mm in greatest dimensions, her margins were negative but the closest was 0.75 mm from the posterior margin, additional superior margin was benign, and additional medial margin was also benign.  She is seen to discuss adjuvant treatment.    PREVIOUS RADIATION THERAPY: No   PAST MEDICAL HISTORY:  Past Medical History:  Diagnosis Date   Allergic rhinitis    Allergy    seasonal   Anemia    Anxiety    Cancer    Cataract    removed bilateraly   Chronic constipation    COVID-19 08/2019   Degenerative disc disease, cervical    Essential hypertension    GERD (gastroesophageal reflux disease) 1957   H/O bronchitis    History of echocardiogram    in epic 04-21-2019,   normal w/ ef 60-65% and mild mitral regurg, no stenosis or restriction   History of TIAs    per pt age 62;  2008;  05/ 2013;  pt stated no residual was from sleep doctor more than likely sleep deprived than actual tia   Motion sickness    cars   Nocturia    OSA on CPAP    Severe ,, CPAP 12 MM H2O- Dr Mayford Knife   PONV (postoperative nausea and vomiting)    Restless leg syndrome    Rosacea    Seasonal asthma    no inhaler   Sleep apnea    uses CPAP   Stroke 1985   TIAx2       PAST SURGICAL HISTORY: Past Surgical History:  Procedure Laterality Date   ANTERIOR CERVICAL DECOMP/DISCECTOMY FUSION N/A 10/25/2017   Procedure: CERVICAL SIX- CERVICAL SEVEN ANTERIOR CERVICAL DECOMPRESSION/DISCECTOMY FUSION, INTERBODY PROSTHESIS AND ANTERIOR PLATING;  Surgeon: Tressie Stalker, MD;  Location: [facility] OR;  Service: Neurosurgery;  Laterality: N/A;  CERVICAL 6- CERVICAL 7 ANTERIOR CERVICAL DECOMPRESSION/DISCECTOMY FUSION, INTERBODY PROSTHESIS AND ANTERIOR PLATING   BACK SURGERY  04/2022   BILATERAL BENIGN BREAST BX'S  20 YRS AGO   BREAST BIOPSY Right    benign   BREAST BIOPSY Right 01/27/2023   MM RT BREAST BX W LOC DEV 1ST LESION IMAGE BX SPEC STEREO GUIDE 01/27/2023 [facility]   BREAST BIOPSY Right 02/05/2023   MM RT BREAST BX W LOC DEV 1ST LESION IMAGE BX SPEC STEREO GUIDE  02/05/2023 [facility]   BREAST BIOPSY  03/09/2023   MM RT RADIOACTIVE SEED LOC MAMMO GUIDE 03/09/2023 [facility]   BREAST BIOPSY  03/09/2023   MM RT RADIOACTIVE SEED EA ADD LESION LOC MAMMO GUIDE 03/09/2023 [facility]   BREAST EXCISIONAL BIOPSY Bilateral over 10 years ago   benign   BREAST LUMPECTOMY WITH RADIOACTIVE SEED LOCALIZATION Right 03/10/2023   Procedure: RIGHT BREAST LUMPECTOMY WITH RADIOACTIVE SEED LOCALIZATION;  Surgeon: Emelia Loron, MD;  Location: [facility] OR;  Service: General;  Laterality: Right;   CARDIAC CATHETERIZATION N/A 08/13/2016   Procedure: Right Heart Cath;  Surgeon: Laurey Morale, MD;  Location: [facility] INVASIVE CV LAB;  Service: Cardiovascular;  Laterality: N/A;    normal study, no evidence pulm HTN   CARPAL TUNNEL RELEASE  2009   BILATERAL   CATARACT EXTRACTION W/PHACO Right 02/27/2020   Procedure: CATARACT EXTRACTION PHACO AND INTRAOCULAR LENS PLACEMENT (IOC) RIGHT 3.60 00:23.9;  Surgeon: Galen Manila, MD;  Location: [facility];  Service: Ophthalmology;  Laterality: Right;  sleep apnea   CATARACT EXTRACTION W/PHACO Left 05/21/2020   Procedure: CATARACT EXTRACTION PHACO AND INTRAOCULAR LENS PLACEMENT (IOC) LEFT 3.69 00:30.9;  Surgeon: Galen Manila, MD;  Location: [facility];  Service: Ophthalmology;  Laterality: Left;  sleep apnea   CHOLECYSTECTOMY N/A 03/25/2021   Procedure: LAPAROSCOPIC CHOLECYSTECTOMY;  Surgeon: Emelia Loron, MD;  Location: [facility] OR;  Service: General;  Laterality: N/A;   CYSTOCELE REPAIR  12/11/2011   Procedure: ANTERIOR REPAIR (CYSTOCELE);  Surgeon: Garnett Farm, MD;  Location: [facility];  Service: Urology;  Laterality: N/A;  1 1/2 hour requested for case  Anterior repair and mid Urethral Sling   INCISIONAL HERNIA REPAIR N/A 12/24/2021   Procedure: LAP ASSISTED INCISIONAL HERNIA REPAIR WITH MESH;  Surgeon: Emelia Loron, MD;  Location: [facility] OR;  Service: General;  Laterality: N/A;  GEN & TAP BLOCK RNFA   MASS EXCISION N/A 07/24/2019   Procedure: EXCISION OF VAGINAL FOREIGN BODY;  Surgeon: Ihor Gully, MD;  Location: [facility];  Service: Urology;  Laterality: N/A;   NASAL SEPTOPLASTY W/ TURBINOPLASTY Bilateral 11/18/2016   Procedure: NASAL SEPTOPLASTY WITH BILATERAL TURBINATE REDUCTION;  Surgeon: Osborn Coho, MD;  Location[facility] OR;  Service: ENT;  Laterality: Bilateral;   NONINVASIVE VASCULAR CAROTID STUDY  09/14/2007   BILATERAL MILD MIX PLAQUE THROUGHOUT, NO SIGNIFICANT BILATERAL ICA STENOSIS   PUBOVAGINAL SLING  12/11/2011   Procedure: Leonides Grills;  Surgeon:  Garnett Farm, MD;  Location: [facility];  Service: Urology;  Laterality: N/A;   PULLEY RELEASE RIGHT THUMB  2009   RADIOACTIVE SEED GUIDED EXCISIONAL BREAST BIOPSY Right 03/10/2023   Procedure: RADIOACTIVE SEED GUIDED EXCISIONAL RIGHT BREAST BIOPSY;  Surgeon: Emelia Loron, MD;  Location: [facility] OR;  Service: General;  Laterality: Right;   SHOULDER ARTHROSCOPY DISTAL CLAVICLE EXCISION AND OPEN ROTATOR CUFF REPAIR Right 01-17-2019   dr supple @[facility]    SINUS ENDO WITH FUSION Bilateral 11/18/2016   Procedure: BILATERAL ENDOSCOPIC SINUS SURGERY;  Surgeon: Osborn Coho, MD;  Location: [facility] OR;  Service: ENT;  Laterality: Bilateral;   UMBILICAL HERNIA REPAIR N/A 03/25/2021   Procedure: Primary REPAIR UMBILICAL HERNIA;  Surgeon: Emelia Loron, MD;  Location: [facility] OR;  Service: General;  Laterality: N/A;   UPPER GASTROINTESTINAL ENDOSCOPY     VAGINAL HYSTERECTOMY  AGE 13     FAMILY HISTORY:  Family History  Adopted: Yes  Problem Relation Age of Onset   Diabetes Mother  Stroke Mother    Alzheimer's disease Mother    Hypertension Sister    Diabetes Sister    Heart attack Brother    Leukemia Brother    CAD Brother    Diabetes Brother    Colon cancer Maternal Aunt    Stroke Paternal Grandmother    Heart attack Paternal Grandfather    Esophageal cancer Neg Hx    Rectal cancer Neg Hx    Stomach cancer Neg Hx      SOCIAL HISTORY:  reports that she has never smoked. She has never used smokeless tobacco. She reports that she does not drink alcohol and does not use drugs. The patient is married and lives in [place], [place]. She's retired and accompanied by her daughter Alvis Lemmings.    ALLERGIES: Nsaids, Ambien [zolpidem], Hydroxyzine hcl, Promethazine hcl, Lactose intolerance (gi), and Septra [sulfamethoxazole-trimethoprim]   MEDICATIONS:  Current Outpatient Medications  Medication Sig Dispense Refill   acetaminophen (TYLENOL) 650 MG CR tablet Take 650 mg by mouth every 8  (eight) hours as needed for pain.     chlorpheniramine-HYDROcodone (TUSSIONEX) 10-8 MG/5ML TAKE BY MOUTH EVERY 12 HOURS AS NEEDED FOR COUGH     citalopram (CELEXA) 10 MG tablet Take 5 mg by mouth in the morning and at bedtime.     clonazePAM (KLONOPIN) 0.5 MG tablet Take 0.5 mg by mouth at bedtime.     cyclobenzaprine (FLEXERIL) 10 MG tablet Take 1 tablet (10 mg total) by mouth 3 (three) times daily as needed for muscle spasms. (Patient taking differently: Take 10 mg by mouth at bedtime as needed for muscle spasms.) 50 tablet 1   dicyclomine (BENTYL) 10 MG capsule TAKE 1 CAPSULE (10 MG TOTAL) BY MOUTH 4 (FOUR) TIMES DAILY AS NEEDED FOR SPASMS. 540 capsule 0   estradiol (ESTRACE) 0.1 MG/GM vaginal cream Apply a pea-sized amount to fingertip and wipe in vaginal introitus twice weekly 42.5 g 1   famotidine (PEPCID) 40 MG tablet Take 40 mg by mouth at bedtime.     fexofenadine (ALLEGRA) 180 MG tablet Take 180 mg by mouth in the morning.     fluticasone (FLONASE) 50 MCG/ACT nasal spray Place 1 spray into both nostrils daily as needed for allergies or rhinitis.     guaiFENesin (MUCINEX) 600 MG 12 hr tablet Take 600 mg by mouth in the morning.     hydrochlorothiazide (HYDRODIURIL) 25 MG tablet Take 1 tablet (25 mg total) by mouth daily.     losartan (COZAAR) 25 MG tablet Take 25 mg by mouth in the morning.     methocarbamol (ROBAXIN) 500 MG tablet Take 500 mg by mouth every 8 (eight) hours as needed for muscle spasms.     ondansetron (ZOFRAN) 4 MG tablet Take 4 mg by mouth every 8 (eight) hours as needed for nausea or vomiting.     pantoprazole (PROTONIX) 40 MG tablet TAKE 1 TABLET BY MOUTH TWICE A DAY 180 tablet 2   potassium chloride (MICRO-K) 10 MEQ CR capsule Take 20 mEq by mouth in the morning.     predniSONE (DELTASONE) 10 MG tablet  x 2 days,  x 2 days,  x 2 days,  x 2 days,  x 2 days,  x 2 days     Probiotic Product (ALIGN PO) Take 1 capsule by mouth in the morning.      promethazine (PHENERGAN) 25 MG tablet Take 1 tablet (25 mg total) by mouth every 6 (six) hours as needed. 30 tablet 0  promethazine (PHENERGAN) 25 MG tablet Take 1 tablet by mouth every 6 (six) hours as needed.     sodium chloride (OCEAN) 0.65 % SOLN nasal spray Place 1 spray into both nostrils 3 (three) times daily as needed for congestion.     traMADol (ULTRAM) 50 MG tablet Take 50 mg by mouth every 6 (six) hours as needed for moderate pain.     traMADol (ULTRAM) 50 MG tablet Take 1 tablet (50 mg total) by mouth every 6 (six) hours as needed. 10 tablet 0   traMADol (ULTRAM) 50 MG tablet 2 (two) times daily.     traZODone (DESYREL) 50 MG tablet Take 50 mg by mouth at bedtime.     No current facility-administered medications for this encounter.     REVIEW OF SYSTEMS: On review of systems, the patient reports that she is doing well since surgery. She's been a bit sore but finds that Tylenol helps her pain. She reports her nipple has been more inverted that immediately following surgery. No other complaints are verbalized.      PHYSICAL EXAM:  Wt Readings from Last 3 Encounters:  03/18/23 175 lb 3.2 oz (79.5 kg)  03/16/23 174 lb (78.9 kg)  03/10/23 174 lb (78.9 kg)   Temp Readings from Last 3 Encounters:  03/18/23 98.1 F (36.7 C)  03/16/23 (!) 97 F (36.1 C) (Tympanic)  03/10/23 98 F (36.7 C)   BP Readings from Last 3 Encounters:  03/18/23 131/66  03/16/23 121/67  03/10/23 (!) 164/75   Pulse Readings from Last 3 Encounters:  03/18/23 78  03/16/23 64  03/10/23 75   Unable to assess given encounter type.  ECOG = 1  0 - Asymptomatic (Fully active, able to carry on all predisease activities without restriction)  1 - Symptomatic but completely ambulatory (Restricted in physically strenuous activity but ambulatory and able to carry out work of a light or sedentary nature. For example, light housework, office work)  2 - Symptomatic, <50% in bed during the day (Ambulatory  and capable of all self care but unable to carry out any work activities. Up and about more than 50% of waking hours)  3 - Symptomatic, >50% in bed, but not bedbound (Capable of only limited self-care, confined to bed or chair 50% or more of waking hours)  4 - Bedbound (Completely disabled. Cannot carry on any self-care. Totally confined to bed or chair)  5 - Death   Santiago Glad MM, Creech RH, Tormey DC, et al. [phone]). "Toxicity and response criteria of the [facility] Group". Am. Evlyn Clines. Oncol. 5 (6): 649-55    LABORATORY DATA:  Lab Results  Component Value Date   WBC 5.7 03/03/2023   HGB 12.3 03/03/2023   HCT 37.5 03/03/2023   MCV 89.5 03/03/2023   PLT 231 03/03/2023   Lab Results  Component Value Date   NA 135 03/03/2023   K 3.8 03/03/2023   CL 95 (L) 03/03/2023   CO2 29 03/03/2023   Lab Results  Component Value Date   ALT 14 08/11/2021   AST 15 08/11/2021   ALKPHOS 45 08/11/2021   BILITOT 0.6 08/11/2021      RADIOGRAPHY: MM Breast Surgical Specimen  Result Date: 03/10/2023 CLINICAL DATA:  Status post seed localized lumpectomy of the RIGHT breast. EXAM: SPECIMEN RADIOGRAPH OF THE RIGHT BREAST COMPARISON:  Previous exam(s). FINDINGS: Status post excision of the right breast. The radioactive seed and coil shaped biopsy marker clip are present, completely intact, and were marked  for pathology. IMPRESSION: Specimen radiograph of the RIGHT breast. Electronically Signed   By: Norva Pavlov M.D.   On: 03/10/2023 10:03  MM Breast Surgical Specimen  Result Date: 03/10/2023 CLINICAL DATA:  Status post seed localized RIGHT lumpectomy. EXAM: SPECIMEN RADIOGRAPH OF THE RIGHT BREAST COMPARISON:  Previous exam(s). FINDINGS: Status post excision of the right breast. The radioactive seed and venous shaped biopsy marker clip are present, completely intact, and were marked for pathology. IMPRESSION: Specimen radiograph of the right breast. Electronically Signed   By:  Norva Pavlov M.D.   On: 03/10/2023 10:01  MM RT RADIOACTIVE SEED LOC MAMMO GUIDE  Result Date: 03/09/2023 CLINICAL DATA:  Localization prior to surgery EXAM: MAMMOGRAPHIC GUIDED RADIOACTIVE SEED LOCALIZATION OF THE RIGHT BREAST COMPARISON:  Previous exam(s). FINDINGS: Patient presents for radioactive seed localization prior to surgery. I met with the patient and we discussed the procedure of seed localization including benefits and alternatives. We discussed the high likelihood of a successful procedure. We discussed the risks of the procedure including infection, bleeding, tissue injury and further surgery. We discussed the low dose of radioactivity involved in the procedure. Informed, written consent was given. The usual time-out protocol was performed immediately prior to the procedure. Using mammographic guidance, sterile technique, 1% lidocaine and an I-125 radioactive seed, the biopsy clip and residual calcifications at the site of DCIS was localized using a superior approach. The follow-up mammogram images confirm the seed in the expected location and were marked for the surgeon. Follow-up survey of the patient confirms presence of the radioactive seed. Order number of I-125 seed:  [id number]. Total activity:  0.254 millicurie reference Date: February 05, 2023 Patient presents for radioactive seed localization prior to surgery. I met with the patient and we discussed the procedure of seed localization including benefits and alternatives. We discussed the high likelihood of a successful procedure. We discussed the risks of the procedure including infection, bleeding, tissue injury and further surgery. We discussed the low dose of radioactivity involved in the procedure. Informed, written consent was given. The usual time-out protocol was performed immediately prior to the procedure. Using mammographic guidance, sterile technique, 1% lidocaine and an I-125 radioactive seed, the estimated site of biopsied  atypia was localized using a lateral approach. The follow-up mammogram images confirm the seed in the expected location and were marked for the surgeon. Follow-up survey of the patient confirms presence of the radioactive seed. Order number of I-125 seed:  [id number]. Total activity:  0.254 millicurie reference Date: February 05, 2023 The patient tolerated the procedure well and was released from the Breast Center. She was given instructions regarding seed removal. IMPRESSION: Radioactive seed localization right breast. No apparent complications. Electronically Signed   By: Gerome Sam III M.D.   On: 03/09/2023 15:42  MM RT RADIO SEED EA ADD LESION LOC MAMMO  Result Date: 03/09/2023 CLINICAL DATA:  Localization prior to surgery EXAM: MAMMOGRAPHIC GUIDED RADIOACTIVE SEED LOCALIZATION OF THE RIGHT BREAST COMPARISON:  Previous exam(s). FINDINGS: Patient presents for radioactive seed localization prior to surgery. I met with the patient and we discussed the procedure of seed localization including benefits and alternatives. We discussed the high likelihood of a successful procedure. We discussed the risks of the procedure including infection, bleeding, tissue injury and further surgery. We discussed the low dose of radioactivity involved in the procedure. Informed, written consent was given. The usual time-out protocol was performed immediately prior to the procedure. Using mammographic guidance, sterile technique, 1% lidocaine and an I-125 radioactive  seed, the biopsy clip and residual calcifications at the site of DCIS was localized using a superior approach. The follow-up mammogram images confirm the seed in the expected location and were marked for the surgeon. Follow-up survey of the patient confirms presence of the radioactive seed. Order number of I-125 seed:  [id number]. Total activity:  0.254 millicurie reference Date: February 05, 2023 Patient presents for radioactive seed localization prior to surgery. I met  with the patient and we discussed the procedure of seed localization including benefits and alternatives. We discussed the high likelihood of a successful procedure. We discussed the risks of the procedure including infection, bleeding, tissue injury and further surgery. We discussed the low dose of radioactivity involved in the procedure. Informed, written consent was given. The usual time-out protocol was performed immediately prior to the procedure. Using mammographic guidance, sterile technique, 1% lidocaine and an I-125 radioactive seed, the estimated site of biopsied atypia was localized using a lateral approach. The follow-up mammogram images confirm the seed in the expected location and were marked for the surgeon. Follow-up survey of the patient confirms presence of the radioactive seed. Order number of I-125 seed:  [id number]. Total activity:  0.254 millicurie reference Date: February 05, 2023 The patient tolerated the procedure well and was released from the Breast Center. She was given instructions regarding seed removal. IMPRESSION: Radioactive seed localization right breast. No apparent complications. Electronically Signed   By: Gerome Sam III M.D.   On: 03/09/2023 15:42      IMPRESSION/PLAN: 1. Intermediate grade, ER/PR positive DCIS of the right breast. Dr. Mitzi Hansen discusses the pathology findings and reviews the nature of early-stage invasive breast disease.  She has done well since surgery.  Dr. Mitzi Hansen discusses the rationale for external radiotherapy to the breast  to reduce risks of local recurrence followed by antiestrogen therapy.  We discussed the risks, benefits, short, and long term effects of radiotherapy, as well as the curative intent, and the patient is interested in proceeding. Dr. Mitzi Hansen discusses the delivery and logistics of radiotherapy and anticipates a course of 4 weeks of radiotherapy to the right breast. The patient will be contacted to coordinate treatment planning by our  simulation department.      In a visit lasting 60 minutes, greater than 50% of the time was spent face to face reviewing her case, as well as in preparation of, discussing, and coordinating the patient's care. I was involved remotely during the encounter while Dr. Mitzi Hansen was in clinic with the patient.  The above documentation reflects my direct findings during this shared patient visit. Please see the separate note by Dr. Mitzi Hansen on this date for the remainder of the patient's plan of care.    Osker Mason, [facility]    **Disclaimer: This note was dictated with voice recognition software. Similar sounding words can inadvertently be transcribed and this note may contain transcription errors which may not have been corrected upon publication of note.**

## 2023-03-24 ENCOUNTER — Other Ambulatory Visit: Payer: Self-pay | Admitting: General Surgery

## 2023-03-25 DIAGNOSIS — D0511 Intraductal carcinoma in situ of right breast: Secondary | ICD-10-CM | POA: Diagnosis not present

## 2023-03-26 ENCOUNTER — Encounter (HOSPITAL_BASED_OUTPATIENT_CLINIC_OR_DEPARTMENT_OTHER): Payer: Self-pay | Admitting: General Surgery

## 2023-03-26 ENCOUNTER — Other Ambulatory Visit: Payer: Self-pay

## 2023-03-29 ENCOUNTER — Telehealth: Payer: Self-pay | Admitting: Radiation Oncology

## 2023-03-29 DIAGNOSIS — G4733 Obstructive sleep apnea (adult) (pediatric): Secondary | ICD-10-CM | POA: Diagnosis not present

## 2023-03-29 NOTE — Telephone Encounter (Signed)
The patient will go back to the OR tomorrow to address her asymmetry and retraction since her previous lumpectomy. We will cancel her simulation and follow up with her pathology results to determine next steps and or plans for radiation.

## 2023-03-29 NOTE — Telephone Encounter (Signed)
The patient returned my call and we reviewed discussion per last phone note.

## 2023-03-30 ENCOUNTER — Ambulatory Visit (HOSPITAL_BASED_OUTPATIENT_CLINIC_OR_DEPARTMENT_OTHER): Payer: HMO | Admitting: Certified Registered"

## 2023-03-30 ENCOUNTER — Other Ambulatory Visit: Payer: Self-pay

## 2023-03-30 ENCOUNTER — Encounter (HOSPITAL_BASED_OUTPATIENT_CLINIC_OR_DEPARTMENT_OTHER)
Admission: RE | Disposition: A | Discharge status: Home / Self Care | Payer: Self-pay | Source: Home / Self Care | Attending: General Surgery

## 2023-03-30 ENCOUNTER — Ambulatory Visit (HOSPITAL_BASED_OUTPATIENT_CLINIC_OR_DEPARTMENT_OTHER)
Admission: RE | Admit: 2023-03-30 | Discharge: 2023-03-30 | Disposition: A | Discharge status: Home / Self Care | Payer: HMO | Attending: General Surgery | Admitting: General Surgery

## 2023-03-30 ENCOUNTER — Encounter (HOSPITAL_BASED_OUTPATIENT_CLINIC_OR_DEPARTMENT_OTHER): Payer: Self-pay | Admitting: General Surgery

## 2023-03-30 DIAGNOSIS — F419 Anxiety disorder, unspecified: Secondary | ICD-10-CM | POA: Insufficient documentation

## 2023-03-30 DIAGNOSIS — D759 Disease of blood and blood-forming organs, unspecified: Secondary | ICD-10-CM | POA: Diagnosis not present

## 2023-03-30 DIAGNOSIS — D0511 Intraductal carcinoma in situ of right breast: Secondary | ICD-10-CM | POA: Diagnosis not present

## 2023-03-30 DIAGNOSIS — I10 Essential (primary) hypertension: Secondary | ICD-10-CM | POA: Insufficient documentation

## 2023-03-30 DIAGNOSIS — Z8673 Personal history of transient ischemic attack (TIA), and cerebral infarction without residual deficits: Secondary | ICD-10-CM | POA: Insufficient documentation

## 2023-03-30 DIAGNOSIS — G4730 Sleep apnea, unspecified: Secondary | ICD-10-CM

## 2023-03-30 DIAGNOSIS — D649 Anemia, unspecified: Secondary | ICD-10-CM | POA: Diagnosis not present

## 2023-03-30 DIAGNOSIS — G4733 Obstructive sleep apnea (adult) (pediatric): Secondary | ICD-10-CM | POA: Insufficient documentation

## 2023-03-30 DIAGNOSIS — K219 Gastro-esophageal reflux disease without esophagitis: Secondary | ICD-10-CM | POA: Insufficient documentation

## 2023-03-30 DIAGNOSIS — J45909 Unspecified asthma, uncomplicated: Secondary | ICD-10-CM

## 2023-03-30 HISTORY — PX: RE-EXCISION OF BREAST LUMPECTOMY: SHX6048

## 2023-03-30 SURGERY — EXCISION, LESION, BREAST
Anesthesia: General | Site: Breast | Laterality: Right

## 2023-03-30 MED ORDER — ENSURE PRE-SURGERY PO LIQD: 296.0000 mL | Freq: Once | ORAL | Status: DC

## 2023-03-30 MED ORDER — OXYCODONE HCL 5 MG/5ML PO SOLN: 5.0000 mg | Freq: Once | ORAL | Status: AC | PRN

## 2023-03-30 MED ORDER — ONDANSETRON HCL 4 MG/2ML IJ SOLN: 4.0000 mg | Freq: Once | INTRAMUSCULAR | Status: DC | PRN

## 2023-03-30 MED ORDER — CHLORHEXIDINE GLUCONATE CLOTH 2 % EX PADS: 6.0000 | MEDICATED_PAD | Freq: Once | CUTANEOUS | Status: DC

## 2023-03-30 MED ORDER — FENTANYL CITRATE (PF) 100 MCG/2ML IJ SOLN
25.0000 ug | INTRAMUSCULAR | Status: DC | PRN
  Administered 2023-03-30 (×2): 50 ug via INTRAVENOUS

## 2023-03-30 MED ORDER — OXYCODONE HCL 5 MG PO TABS
ORAL_TABLET | ORAL | Status: AC
  Filled 2023-03-30: qty 1

## 2023-03-30 MED ORDER — FENTANYL CITRATE (PF) 100 MCG/2ML IJ SOLN
INTRAMUSCULAR | Status: DC | PRN
  Administered 2023-03-30 (×4): 25 ug via INTRAVENOUS

## 2023-03-30 MED ORDER — ONDANSETRON HCL 4 MG/2ML IJ SOLN
INTRAMUSCULAR | Status: AC
  Filled 2023-03-30: qty 2

## 2023-03-30 MED ORDER — ACETAMINOPHEN 500 MG PO TABS: 1000.0000 mg | ORAL_TABLET | Freq: Once | ORAL | Status: AC

## 2023-03-30 MED ORDER — LACTATED RINGERS IV SOLN: INTRAVENOUS | Status: DC

## 2023-03-30 MED ORDER — OXYCODONE HCL 5 MG PO TABS: 5.0000 mg | ORAL_TABLET | Freq: Four times a day (QID) | ORAL | 0 refills | Status: DC | PRN

## 2023-03-30 MED ORDER — PROPOFOL 10 MG/ML IV BOLUS
INTRAVENOUS | Status: AC
  Filled 2023-03-30: qty 20

## 2023-03-30 MED ORDER — LIDOCAINE HCL (CARDIAC) PF 100 MG/5ML IV SOSY
PREFILLED_SYRINGE | INTRAVENOUS | Status: DC | PRN
  Administered 2023-03-30: 60 mg via INTRAVENOUS

## 2023-03-30 MED ORDER — CEFAZOLIN SODIUM-DEXTROSE 2-4 GM/100ML-% IV SOLN
2.0000 g | INTRAVENOUS | Status: AC
  Administered 2023-03-30: 2 g via INTRAVENOUS

## 2023-03-30 MED ORDER — CEFAZOLIN SODIUM-DEXTROSE 2-4 GM/100ML-% IV SOLN
INTRAVENOUS | Status: AC
  Filled 2023-03-30: qty 100

## 2023-03-30 MED ORDER — OXYCODONE HCL 5 MG PO TABS
5.0000 mg | ORAL_TABLET | Freq: Once | ORAL | Status: AC | PRN
  Administered 2023-03-30: 5 mg via ORAL

## 2023-03-30 MED ORDER — ACETAMINOPHEN 325 MG PO TABS: 325.0000 mg | ORAL_TABLET | ORAL | Status: DC | PRN

## 2023-03-30 MED ORDER — PROPOFOL 10 MG/ML IV BOLUS
INTRAVENOUS | Status: DC | PRN
  Administered 2023-03-30: 50 mg via INTRAVENOUS
  Administered 2023-03-30: 150 mg via INTRAVENOUS

## 2023-03-30 MED ORDER — MEPERIDINE HCL 25 MG/ML IJ SOLN: 6.2500 mg | INTRAMUSCULAR | Status: DC | PRN

## 2023-03-30 MED ORDER — TRAMADOL HCL 50 MG PO TABS: 50.0000 mg | ORAL_TABLET | Freq: Four times a day (QID) | ORAL | 0 refills | Status: AC | PRN

## 2023-03-30 MED ORDER — ACETAMINOPHEN 160 MG/5ML PO SOLN: 325.0000 mg | ORAL | Status: DC | PRN

## 2023-03-30 MED ORDER — PROPOFOL 500 MG/50ML IV EMUL
INTRAVENOUS | Status: DC | PRN
  Administered 2023-03-30: 150 ug/kg/min via INTRAVENOUS

## 2023-03-30 MED ORDER — FENTANYL CITRATE (PF) 100 MCG/2ML IJ SOLN
INTRAMUSCULAR | Status: AC
  Filled 2023-03-30: qty 2

## 2023-03-30 MED ORDER — DEXAMETHASONE SODIUM PHOSPHATE 10 MG/ML IJ SOLN
INTRAMUSCULAR | Status: AC
  Filled 2023-03-30: qty 1

## 2023-03-30 MED ORDER — LIDOCAINE 2% (20 MG/ML) 5 ML SYRINGE
INTRAMUSCULAR | Status: AC
  Filled 2023-03-30: qty 5

## 2023-03-30 MED ORDER — ACETAMINOPHEN 500 MG PO TABS
ORAL_TABLET | ORAL | Status: AC
  Filled 2023-03-30: qty 2

## 2023-03-30 MED ORDER — DEXAMETHASONE SODIUM PHOSPHATE 10 MG/ML IJ SOLN
INTRAMUSCULAR | Status: DC | PRN
  Administered 2023-03-30: 5 mg via INTRAVENOUS

## 2023-03-30 MED ORDER — ACETAMINOPHEN 500 MG PO TABS
1000.0000 mg | ORAL_TABLET | ORAL | Status: AC
  Administered 2023-03-30: 1000 mg via ORAL

## 2023-03-30 MED ORDER — ONDANSETRON HCL 4 MG/2ML IJ SOLN
INTRAMUSCULAR | Status: DC | PRN
  Administered 2023-03-30: 4 mg via INTRAVENOUS

## 2023-03-30 MED ORDER — BUPIVACAINE HCL (PF) 0.25 % IJ SOLN
INTRAMUSCULAR | Status: DC | PRN
  Administered 2023-03-30: 5 mL

## 2023-03-30 SURGICAL SUPPLY — 53 items
ADH SKN CLS APL DERMABOND .7 (GAUZE/BANDAGES/DRESSINGS) ×1
APL PRP STRL LF DISP 70% ISPRP (MISCELLANEOUS) ×1
BINDER BREAST LRG (GAUZE/BANDAGES/DRESSINGS) IMPLANT
BINDER BREAST MEDIUM (GAUZE/BANDAGES/DRESSINGS) IMPLANT
BINDER BREAST XLRG (GAUZE/BANDAGES/DRESSINGS) IMPLANT
BINDER BREAST XXLRG (GAUZE/BANDAGES/DRESSINGS) IMPLANT
BLADE SURG 15 STRL LF DISP TIS (BLADE) ×2 IMPLANT
BLADE SURG 15 STRL SS (BLADE) ×1
CANISTER SUCT 1200ML W/VALVE (MISCELLANEOUS) ×2 IMPLANT
CHLORAPREP W/TINT 26 (MISCELLANEOUS) ×2 IMPLANT
CLIP TI WIDE RED SMALL 6 (CLIP) IMPLANT
COVER BACK TABLE 60X90IN (DRAPES) ×2 IMPLANT
COVER MAYO STAND STRL (DRAPES) ×2 IMPLANT
DERMABOND ADVANCED .7 DNX12 (GAUZE/BANDAGES/DRESSINGS) IMPLANT
DRAPE LAPAROSCOPIC ABDOMINAL (DRAPES) ×2 IMPLANT
DRAPE UTILITY XL STRL (DRAPES) ×2 IMPLANT
DRSG TEGADERM 4X4.75 (GAUZE/BANDAGES/DRESSINGS) ×2 IMPLANT
ELECT COATED BLADE 2.86 ST (ELECTRODE) ×2 IMPLANT
ELECT REM PT RETURN 9FT ADLT (ELECTROSURGICAL) ×1
ELECTRODE REM PT RTRN 9FT ADLT (ELECTROSURGICAL) ×2 IMPLANT
GAUZE SPONGE 4X4 12PLY STRL LF (GAUZE/BANDAGES/DRESSINGS) ×2 IMPLANT
GLOVE BIO SURGEON STRL SZ7 (GLOVE) ×2 IMPLANT
GLOVE BIOGEL PI IND STRL 7.0 (GLOVE) IMPLANT
GLOVE BIOGEL PI IND STRL 7.5 (GLOVE) ×2 IMPLANT
GOWN STRL REUS W/ TWL LRG LVL3 (GOWN DISPOSABLE) ×6 IMPLANT
GOWN STRL REUS W/TWL LRG LVL3 (GOWN DISPOSABLE) ×2
HEMOSTAT ARISTA ABSORB 3G PWDR (HEMOSTASIS) IMPLANT
KIT MARKER MARGIN INK (KITS) IMPLANT
NDL HYPO 25X1 1.5 SAFETY (NEEDLE) ×2 IMPLANT
NEEDLE HYPO 25X1 1.5 SAFETY (NEEDLE) ×1 IMPLANT
NS IRRIG 1000ML POUR BTL (IV SOLUTION) IMPLANT
PACK BASIN DAY SURGERY FS (CUSTOM PROCEDURE TRAY) ×2 IMPLANT
PENCIL SMOKE EVACUATOR (MISCELLANEOUS) ×2 IMPLANT
RETRACTOR ONETRAX LX 90X20 (MISCELLANEOUS) IMPLANT
SLEEVE SCD COMPRESS KNEE MED (STOCKING) ×2 IMPLANT
SPIKE FLUID TRANSFER (MISCELLANEOUS) IMPLANT
SPONGE T-LAP 4X18 ~~LOC~~+RFID (SPONGE) ×2 IMPLANT
STRIP CLOSURE SKIN 1/2X4 (GAUZE/BANDAGES/DRESSINGS) IMPLANT
SUT ETHILON 3 0 PS 1 (SUTURE) IMPLANT
SUT MNCRL AB 3-0 PS2 18 (SUTURE) IMPLANT
SUT MNCRL AB 4-0 PS2 18 (SUTURE) IMPLANT
SUT MON AB 5-0 PS2 18 (SUTURE) IMPLANT
SUT SILK 2 0 SH (SUTURE) IMPLANT
SUT VIC AB 2-0 SH 27 (SUTURE) ×4
SUT VIC AB 2-0 SH 27XBRD (SUTURE) ×2 IMPLANT
SUT VIC AB 3-0 SH 27 (SUTURE) ×2
SUT VIC AB 3-0 SH 27X BRD (SUTURE) ×2 IMPLANT
SUT VIC AB 5-0 PS2 18 (SUTURE) IMPLANT
SUT VICRYL AB 3 0 TIES (SUTURE) IMPLANT
SYR CONTROL 10ML LL (SYRINGE) ×2 IMPLANT
TOWEL GREEN STERILE FF (TOWEL DISPOSABLE) ×2 IMPLANT
TUBE CONNECTING 20X1/4 (TUBING) ×2 IMPLANT
YANKAUER SUCT BULB TIP NO VENT (SUCTIONS) ×2 IMPLANT

## 2023-03-30 NOTE — Anesthesia Preprocedure Evaluation (Addendum)
Anesthesia Evaluation  Patient identified by MRN, date of birth, ID band Patient awake    Reviewed: Allergy & Precautions, NPO status , Patient's Chart, lab work & pertinent test results  History of Anesthesia Complications (+) PONV and history of anesthetic complications  Airway Mallampati: II  TM Distance: >3 FB Neck ROM: Full    Dental  (+) Dental Advisory Given   Pulmonary asthma , sleep apnea    breath sounds clear to auscultation       Cardiovascular hypertension, Pt. on medications  Rhythm:Regular Rate:Normal     Neuro/Psych  PSYCHIATRIC DISORDERS Anxiety      Neuromuscular disease CVA    GI/Hepatic Neg liver ROS,GERD  ,,  Endo/Other  negative endocrine ROS    Renal/GU negative Renal ROS     Musculoskeletal  (+) Arthritis ,    Abdominal   Peds  Hematology negative hematology ROS (+) Blood dyscrasia, anemia   Anesthesia Other Findings   Reproductive/Obstetrics                             Anesthesia Physical Anesthesia Plan  ASA: 2  Anesthesia Plan: General   Post-op Pain Management: Tylenol PO (pre-op)* and Toradol IV (intra-op)*   Induction: Intravenous  PONV Risk Score and Plan: 4 or greater and Ondansetron, Treatment may vary due to age or medical condition and TIVA  Airway Management Planned: LMA  Additional Equipment:   Intra-op Plan:   Post-operative Plan: Extubation in OR  Informed Consent: I have reviewed the patients History and Physical, chart, labs and discussed the procedure including the risks, benefits and alternatives for the proposed anesthesia with the patient or authorized representative who has indicated his/her understanding and acceptance.     Dental advisory given  Plan Discussed with:   Anesthesia Plan Comments: (PAT note by Antionette Poles, PA-C: History includes never smoker, post-operative N/V, HTN, GERD, hiatal hernia, anemia, OSA (uses  CPAP), seasonal asthma, TIA? (2008 versus sleep deprivation), cholecystectomy (03/25/21), sinus surgery (septoplasty & turbinate reduction 11/18/16), spinal surgery (C6-7 10/25/17).    Last cardiologist visit was on 12/31/20 with Dr. Mariah Milling.  He noted chronic lateral negative T waves on EKG with prior negative stress test in 2017. She has had work-up for chronic dyspnea (most studies done in 2017) including CTA chest showing no PE or ILD, normal PFTs except decreased DLCO, non-ischemic Cardiolite, near normal CPX primarily limited by body habitus, normal RHC (with pulmonary HTN by previous echo), and most recently a 2020 echo showed normal LVEF, impaired LV relaxation, normal RVSF, mild MI/MR. She is using CPAP for OSA. He advised as needed cardiology follow-up.   Preop labs reviewed, unremarkable.  EKG 03/03/2023: NSR.  Rate 66.  Anteroseptal infarct, age undetermined.  Lateral ST and T wave abnormality.  Does not appear significantly changed.  Echo 04/21/19: Sonographer Comments: Suboptimal subcostal window.  IMPRESSIONS  1. The left ventricle has normal systolic function with an ejection  fraction of 60-65%. The cavity size was normal. There is mildly increased  left ventricular wall thickness. Left ventricular diastolic Doppler  parameters are consistent with impaired  relaxation.  2. The right ventricle has normal systolic function. The cavity was  normal. There is no increase in right ventricular wall thickness.Unable to  estimate RVSP  3. Aortic valve regurgitation is mild by color flow Doppler.  4. Mild mitral valve regurgitation   RHC 08/13/16: Normal study. No evidence for pulmonary hypertension and normal filling  pressures.   CPX 05/26/16: Conclusion: Exercise testing with gas-exchange demonstrates  normal functional capacity when compared to matched sedentary  norms. There is no evidence of cardiopulmonary limitation or  exercise-induced bronchospasm. Patient appears  primarily limited  due to her body habitus. There was also chronotropic  incompetence.   Nuclear stress test 04/30/16: ? Nuclear stress EF: 72%. ? There was no ST segment deviation noted during stress. ? This is a low risk study. ? The left ventricular ejection fraction is hyperdynamic (>65%). Breast artifact no ischemia or infarction EF normal 72% no RWMAls   )        Anesthesia Quick Evaluation

## 2023-03-30 NOTE — Op Note (Signed)
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 Harden Mo Anesthesia: General Estimated blood loss: Minimal Complications: None Drains: None Specimens: Right breast posterior margin marked short superior, long lateral, double deep Sponge needle count was correct completion Disposition recovery stable condition  Indications: This is a 77 year old female who underwent a right breast lumpectomy for what ended up being focal residual DCIS.  Her posterior margin was 0.75 mm.  The options were given of proceeding with radiation but she had a significant amount of scarring that was pulling her nipple back towards the muscle.  I thought it would be better to go reexcised this area and potentially avoid radiation given how the scarring had occurred.  Procedure: After informed consent was obtained she was taken to the operating room.  She was given antibiotics.  SCDs were in place.  She was placed under general anesthesia without complication.  She was prepped and draped in the standard sterile surgical fashion.  A surgical timeout was then performed.  I reentered her old incision and released all the sutures.  I think that there was just an exuberant scar reaction associated with where I had sutured the breast tissue back together which was leading this to be pulled and especially with standing.  I did remove the posterior margin.  The entire posterior margin is now just the muscle.  I then mobilized the breast tissue.  I removed some of the skin around the old incision to close this.  I then closed all of the breast tissue down inferiorly with 2-0 Vicryl suture.  This was done in several layers.  I then closed the skin with 3-0 Vicryl and 4-0 Monocryl.  I sat her up while I was in the operating room and there was no puckering present of her nipple and the areola at  that time.  I then did place a couple of 3-0 nylon sutures glue and Steri-Strips to close the wound.  She tolerated this well was extubated and transferred to recovery stable.

## 2023-03-30 NOTE — Anesthesia Procedure Notes (Signed)
Procedure Name: LMA Insertion Date/Time: 03/30/2023 12:51 PM  Performed by: Lauralyn Primes, CRNAPre-anesthesia Checklist: Patient identified, Emergency Drugs available, Suction available and Patient being monitored Patient Re-evaluated:Patient Re-evaluated prior to induction Oxygen Delivery Method: Circle system utilized Preoxygenation: Pre-oxygenation with 100% oxygen Induction Type: IV induction Ventilation: Mask ventilation without difficulty LMA: LMA inserted LMA Size: 4.0 Number of attempts: 1 Airway Equipment and Method: Bite block Placement Confirmation: positive ETCO2 Tube secured with: Tape Dental Injury: Teeth and Oropharynx as per pre-operative assessment

## 2023-03-30 NOTE — Discharge Instructions (Addendum)
[place] Office Phone Number [phone]  POST OP INSTRUCTIONS Take 400 mg of ibuprofen every 8 hours or 650 mg tylenol every 6 hours for next 72 hours then as needed. Use ice several times daily also.  A prescription for pain medication may be given to you upon discharge.  Take your pain medication as prescribed, if needed.  If narcotic pain medicine is not needed, then you may take acetaminophen (Tylenol), naprosyn (Alleve) or ibuprofen (Advil) as needed. Take your usually prescribed medications unless otherwise directed If you need a refill on your pain medication, please contact your pharmacy.  They will contact our office to request authorization.  Prescriptions will not be filled after 5pm or on week-ends. You should eat very light the first 24 hours after surgery, such as soup, crackers, pudding, etc.  Resume your normal diet the day after surgery. Most patients will experience some swelling and bruising in the breast.  Ice packs and a good support bra will help.  Wear the breast binder provided or a sports bra for 72 hours day and night.  After that wear a sports bra during the day until you return to the office. Swelling and bruising can take several days to resolve.  It is common to experience some constipation if taking pain medication after surgery.  Increasing fluid intake and taking a stool softener will usually help or prevent this problem from occurring.  A mild laxative (Milk of Magnesia or Miralax) should be taken according to package directions if there are no bowel movements after 48 hours. I used skin glue on the incision, you may shower in 24 hours.  The glue will flake off over the next 2-3 weeks.  Any sutures or staples will be removed at the office during your follow-up visit. ACTIVITIES:  You may resume regular daily activities (gradually increasing) beginning the next day.  Wearing a good support bra or sports bra minimizes pain and swelling.  You may have  sexual intercourse when it is comfortable. You may drive when you no longer are taking prescription pain medication, you can comfortably wear a seatbelt, and you can safely maneuver your car and apply brakes. RETURN TO WORK:  ______________________________________________________________________________________ Dennis Bast should see your doctor in the office for a follow-up appointment approximately two weeks after your surgery.  Your doctor's nurse will typically make your follow-up appointment when she calls you with your pathology report.  Expect your pathology report 3-4 business days after your surgery.  You may call to check if you do not hear from [place] after three days. OTHER INSTRUCTIONS: _______________________________________________________________________________________________ _____________________________________________________________________________________________________________________________________ _____________________________________________________________________________________________________________________________________ _____________________________________________________________________________________________________________________________________  WHEN TO CALL DR WAKEFIELD: Fever over 101.0 Nausea and/or vomiting. Extreme swelling or bruising. Continued bleeding from incision. Increased pain, redness, or drainage from the incision.  The clinic staff is available to answer your questions during regular business hours.  Please don't hesitate to call and ask to speak to one of the nurses for clinical concerns.  If you have a medical emergency, go to the nearest emergency room or call 911.  A surgeon from [facility] is always on call at the hospital.  For further questions, please visit [email or web address]   Post Anesthesia Home Care Instructions  Activity: Get plenty of rest for the remainder of the day. A responsible individual must stay  with you for 24 hours following the procedure.  For the next 24 hours, DO NOT: -Drive a car -Paediatric nurse -Drink alcoholic beverages -Take any medication unless instructed by  your physician -Make any legal decisions or sign important papers.  Meals: Start with liquid foods such as gelatin or soup. Progress to regular foods as tolerated. Avoid greasy, spicy, heavy foods. If nausea and/or vomiting occur, drink only clear liquids until the nausea and/or vomiting subsides. Call your physician if vomiting continues.  Special Instructions/Symptoms: Your throat may feel dry or sore from the anesthesia or the breathing tube placed in your throat during surgery. If this causes discomfort, gargle with warm salt water. The discomfort should disappear within 24 hours.  If you had a scopolamine patch placed behind your ear for the management of post- operative nausea and/or vomiting:  1. The medication in the patch is effective for 72 hours, after which it should be removed.  Wrap patch in a tissue and discard in the trash. Wash hands thoroughly with soap and water. 2. You may remove the patch earlier than 72 hours if you experience unpleasant side effects which may include dry mouth, dizziness or visual disturbances. 3. Avoid touching the patch. Wash your hands with soap and water after contact with the patch.    Next dose of tylenol at 5:15pm Oxycodone given at 2:39pm

## 2023-03-30 NOTE — Transfer of Care (Signed)
Immediate Anesthesia Transfer of Care Note  Patient: Amy Vang  Procedure(s) Performed: RE-EXCISION OF RIGHT BREAST LUMPECTOMY (Right: Breast)  Patient Location: PACU  Anesthesia Type:General  Level of Consciousness: drowsy  Airway & Oxygen Therapy: Patient Spontanous Breathing and Patient connected to face mask oxygen  Post-op Assessment: Report given to RN and Post -op Vital signs reviewed and stable  Post vital signs: Reviewed and stable  Last Vitals:  Vitals Value Taken Time  BP 166/75 (103)   Temp    Pulse 68 03/30/23 1348  Resp 100   SpO2 99 % 03/30/23 1348  Vitals shown include unvalidated device data.  Last Pain:  Vitals:   03/30/23 1111  TempSrc: Oral  PainSc: 3       Patients Stated Pain Goal: 3 (03/30/23 1111)  Complications: No notable events documented.

## 2023-03-30 NOTE — Interval H&P Note (Signed)
History and Physical Interval Note:  03/30/2023 12:26 PM  Amy Vang  has presented today for surgery, with the diagnosis of RIGHT BREAST DCIS.  The various methods of treatment have been discussed with the patient and family. After consideration of risks, benefits and other options for treatment, the patient has consented to  Procedure(s): RE-EXCISION OF RIGHT BREAST LUMPECTOMY (Right) as a surgical intervention.  The patient's history has been reviewed, patient examined, no change in status, stable for surgery.  I have reviewed the patient's chart and labs.  Questions were answered to the patient's satisfaction.     Emelia Loron

## 2023-03-31 ENCOUNTER — Inpatient Hospital Stay: Payer: HMO | Attending: Oncology | Admitting: Hospice and Palliative Medicine

## 2023-03-31 ENCOUNTER — Encounter (HOSPITAL_BASED_OUTPATIENT_CLINIC_OR_DEPARTMENT_OTHER): Payer: Self-pay | Admitting: General Surgery

## 2023-03-31 DIAGNOSIS — Z79810 Long term (current) use of selective estrogen receptor modulators (SERMs): Secondary | ICD-10-CM | POA: Insufficient documentation

## 2023-03-31 DIAGNOSIS — D0511 Intraductal carcinoma in situ of right breast: Secondary | ICD-10-CM | POA: Insufficient documentation

## 2023-03-31 NOTE — Progress Notes (Signed)
Multidisciplinary Oncology Council Documentation  Amy Vang was presented by our [facility] on 03/31/2023, which included representatives from:  Palliative Care Dietitian  Physical/Occupational Therapist Nurse Navigator Genetics Speech Therapist Social work Survivorship RN Financial Navigator Research RN   [name] currently presents with history of breast cancer  We reviewed previous medical and familial history, history of present illness, and recent lab results along with all available histopathologic and imaging studies. The MOC considered available treatment options and made the following recommendations/referrals:  Rehab screening  The MOC is a meeting of clinicians from various specialty areas who evaluate and discuss patients for whom a multidisciplinary approach is being considered. Final determinations in the plan of care are those of the provider(s).   Today's extended care, comprehensive team conference, Amy Vang was not present for the discussion and was not examined.

## 2023-04-01 LAB — SURGICAL PATHOLOGY

## 2023-04-01 NOTE — Anesthesia Postprocedure Evaluation (Signed)
Anesthesia Post Note  Patient: Amy Vang  Procedure(s) Performed: RE-EXCISION OF RIGHT BREAST LUMPECTOMY (Right: Breast)     Patient location during evaluation: PACU Anesthesia Type: General Level of consciousness: awake and alert Pain management: pain level controlled Vital Signs Assessment: post-procedure vital signs reviewed and stable Respiratory status: spontaneous breathing, nonlabored ventilation, respiratory function stable and patient connected to nasal cannula oxygen Cardiovascular status: blood pressure returned to baseline and stable Postop Assessment: no apparent nausea or vomiting Anesthetic complications: no   No notable events documented.  Last Vitals:  Vitals:   03/30/23 1434 03/30/23 1512  BP: (!) 176/79 (!) 174/74  Pulse: 67 (!) 59  Resp: 17 17  Temp: (!) 36.3 C   SpO2: 94% 98%    Last Pain:  Vitals:   03/30/23 1447  TempSrc:   PainSc: 6    Pain Goal: Patients Stated Pain Goal: 3 (03/30/23 1111)                 Shiana Rappleye

## 2023-04-05 ENCOUNTER — Ambulatory Visit: Payer: HMO | Admitting: Radiation Oncology

## 2023-04-06 ENCOUNTER — Telehealth: Payer: Self-pay | Admitting: Radiation Oncology

## 2023-04-06 NOTE — Telephone Encounter (Signed)
I called to let the patient know that Dr. Mitzi Hansen reviewed her pathology from her re-excision and that she may forgo radiotherapy. I had to leave a voicemail for her to share the information and encouraged her to follow back up with me if she has questions or concerns.

## 2023-04-16 ENCOUNTER — Inpatient Hospital Stay: Payer: HMO | Admitting: Oncology

## 2023-04-19 ENCOUNTER — Encounter: Payer: Self-pay | Admitting: Oncology

## 2023-04-19 ENCOUNTER — Other Ambulatory Visit: Payer: Self-pay | Admitting: *Deleted

## 2023-04-19 ENCOUNTER — Inpatient Hospital Stay (HOSPITAL_BASED_OUTPATIENT_CLINIC_OR_DEPARTMENT_OTHER): Payer: HMO | Admitting: Oncology

## 2023-04-19 ENCOUNTER — Other Ambulatory Visit: Payer: Self-pay | Admitting: Oncology

## 2023-04-19 VITALS — BP 138/67 | HR 64 | Temp 97.9°F | Resp 18 | Wt 171.9 lb

## 2023-04-19 DIAGNOSIS — D0511 Intraductal carcinoma in situ of right breast: Secondary | ICD-10-CM

## 2023-04-19 DIAGNOSIS — Z79810 Long term (current) use of selective estrogen receptor modulators (SERMs): Secondary | ICD-10-CM | POA: Diagnosis not present

## 2023-04-19 MED ORDER — TAMOXIFEN CITRATE 10 MG PO TABS: 5.0000 mg | ORAL_TABLET | Freq: Every day | ORAL | 3 refills | Status: DC

## 2023-04-19 NOTE — Progress Notes (Signed)
Hematology/Oncology Consult note [facility]  [phone] Fax:([phone]  Patient Care Team: Merri Brunette, MD as PCP - General (Family Medicine)   Name of the patient: Amy Vang  [id number]  May 12, 1946   Date of visit: 04/19/23  Diagnosis-right breast DCIS ER positive  Chief complaint/ Reason for visit-routine follow-up of right breast DCIS  Heme/Onc history: Patient is a 77 year old female who underwent a routine bilateral screening mammogram in February 2024 which showed possible calcifications in the right breast.  This was followed by diagnostic mammogram which showed group of calcifications in the lateral inferior right breast spanning up to 19 mm.  Patient had a biopsy of this area which was consistent with Intermediate grade DCIS 0.3 cm estrogen 100% positive and progesterone 20% positive there was also another biopsy at the 6:00 calcifications which was consistent with atypical ductal hyperplasia   Patient underwent right lumpectomy on 03/10/2023 which showed intermediate nuclear grade cribriform type DCIS without necrosis and negative for invasive carcinoma.  DCIS measures 4 mm in greatest dimension additional medial and superior margin negative for carcinoma margins were negative for DCIS but close at 0.75 mm from the posterior margin.  She underwent reexcision surgery with negative margins.  She was also seen by radiation oncology and was deemed to not require adjuvant radiation therapy due to her age  Interval history-she is doing well presently and denies any specific complaints at this time.  In the past patient has had issues tolerating medications and especially having GI related side effects.  She is recovering slowly from her reexcision surgery  ECOG PS- 1 Pain scale- 0   Review of systems- Review of Systems  Constitutional:  Negative for chills, fever, malaise/fatigue and weight loss.  HENT:  Negative for congestion,  ear discharge and nosebleeds.   Eyes:  Negative for blurred vision.  Respiratory:  Negative for cough, hemoptysis, sputum production, shortness of breath and wheezing.   Cardiovascular:  Negative for chest pain, palpitations, orthopnea and claudication.  Gastrointestinal:  Negative for abdominal pain, blood in stool, constipation, diarrhea, heartburn, melena, nausea and vomiting.  Genitourinary:  Negative for dysuria, flank pain, frequency, hematuria and urgency.  Musculoskeletal:  Negative for back pain, joint pain and myalgias.  Skin:  Negative for rash.  Neurological:  Negative for dizziness, tingling, focal weakness, seizures, weakness and headaches.  Endo/Heme/Allergies:  Does not bruise/bleed easily.  Psychiatric/Behavioral:  Negative for depression and suicidal ideas. The patient does not have insomnia.       Allergies  Allergen Reactions   Nsaids      Abdominal Pain   Ambien [Zolpidem] Other (See Comments)    groggy   Hydroxyzine Hcl     UNSPECIFIED REACTION    Lactose Intolerance (Gi) Other (See Comments)    bloating   Septra [Sulfamethoxazole-Trimethoprim] Rash     Past Medical History:  Diagnosis Date   Allergic rhinitis    Allergy    seasonal   Anemia    Anxiety    Cancer (HCC)    Cataract    removed bilateraly   Chronic constipation    COVID-19 08/2019   Degenerative disc disease, cervical    Essential hypertension    GERD (gastroesophageal reflux disease) 1957   H/O bronchitis    History of echocardiogram    in epic 04-21-2019,  normal w/ ef 60-65% and mild mitral regurg, no stenosis or restriction   History of TIAs    per pt age 17;  2008;  05/ 2013;  pt stated no residual was from sleep doctor more than likely sleep deprived than actual tia   Motion sickness    cars   Nocturia    OSA on CPAP    Severe ,, CPAP 12 MM H2O- Dr Mayford Knife   PONV (postoperative nausea and vomiting)    Restless leg syndrome    Rosacea    Seasonal asthma    no inhaler    Sleep apnea    uses CPAP   Stroke (HCC) 1985   TIAx2     Past Surgical History:  Procedure Laterality Date   ANTERIOR CERVICAL DECOMP/DISCECTOMY FUSION N/A 10/25/2017   Procedure: CERVICAL SIX- CERVICAL SEVEN ANTERIOR CERVICAL DECOMPRESSION/DISCECTOMY FUSION, INTERBODY PROSTHESIS AND ANTERIOR PLATING;  Surgeon: Tressie Stalker, MD;  Location: [facility] OR;  Service: Neurosurgery;  Laterality: N/A;  CERVICAL 6- CERVICAL 7 ANTERIOR CERVICAL DECOMPRESSION/DISCECTOMY FUSION, INTERBODY PROSTHESIS AND ANTERIOR PLATING   BACK SURGERY  04/2022   BILATERAL BENIGN BREAST BX'S  20 YRS AGO   BREAST BIOPSY Right    benign   BREAST BIOPSY Right 01/27/2023   MM RT BREAST BX W LOC DEV 1ST LESION IMAGE BX SPEC STEREO GUIDE 01/27/2023 [facility]   BREAST BIOPSY Right 02/05/2023   MM RT BREAST BX W LOC DEV 1ST LESION IMAGE BX SPEC STEREO GUIDE 02/05/2023 [facility]   BREAST BIOPSY  03/09/2023   MM RT RADIOACTIVE SEED LOC MAMMO GUIDE 03/09/2023 [facility]   BREAST BIOPSY  03/09/2023   MM RT RADIOACTIVE SEED EA ADD LESION LOC MAMMO GUIDE 03/09/2023 [facility]   BREAST EXCISIONAL BIOPSY Bilateral over 10 years ago   benign   BREAST LUMPECTOMY WITH RADIOACTIVE SEED LOCALIZATION Right 03/10/2023   Procedure: RIGHT BREAST LUMPECTOMY WITH RADIOACTIVE SEED LOCALIZATION;  Surgeon: Emelia Loron, MD;  Location: [facility] OR;  Service: General;  Laterality: Right;   CARDIAC CATHETERIZATION N/A 08/13/2016   Procedure: Right Heart Cath;  Surgeon: Laurey Morale, MD;  Location: [facility] INVASIVE CV LAB;  Service: Cardiovascular;  Laterality: N/A;    normal study, no evidence pulm HTN   CARPAL TUNNEL RELEASE  2009   BILATERAL   CATARACT EXTRACTION W/PHACO Right 02/27/2020   Procedure: CATARACT EXTRACTION PHACO AND INTRAOCULAR LENS PLACEMENT (IOC) RIGHT 3.60 00:23.9;  Surgeon: Galen Manila, MD;  Location[facility];  Service: Ophthalmology;  Laterality: Right;  sleep apnea   CATARACT EXTRACTION  W/PHACO Left 05/21/2020   Procedure: CATARACT EXTRACTION PHACO AND INTRAOCULAR LENS PLACEMENT (IOC) LEFT 3.69 00:30.9;  Surgeon: Galen Manila, MD;  Location: [facility]ERY [facility];  Service: Ophthalmology;  Laterality: Left;  sleep apnea   CHOLECYSTECTOMY N/A 03/25/2021   Procedure: LAPAROSCOPIC CHOLECYSTECTOMY;  Surgeon: Emelia Loron, MD;  Location: [facility] OR;  Service: General;  Laterality: N/A;   CYSTOCELE REPAIR  12/11/2011   Procedure: ANTERIOR REPAIR (CYSTOCELE);  Surgeon: Garnett Farm, MD;  Location: [facility];  Service: Urology;  Laterality: N/A;  1 1/2 hour requested for case  Anterior repair and mid Urethral Sling   INCISIONAL HERNIA REPAIR N/A 12/24/2021   Procedure: LAP ASSISTED INCISIONAL HERNIA REPAIR WITH MESH;  Surgeon: Emelia Loron, MD;  Location: [facility] OR;  Service: General;  Laterality: N/A;  GEN & TAP BLOCK RNFA   MASS EXCISION N/A 07/24/2019   Procedure: EXCISION OF VAGINAL FOREIGN BODY;  Surgeon: Ihor Gully, MD;  Location: [facility];  Service: Urology;  Laterality: N/A;   NASAL SEPTOPLASTY W/ TURBINOPLASTY Bilateral 11/18/2016   Procedure: NASAL SEPTOPLASTY WITH  BILATERAL TURBINATE REDUCTION;  Surgeon: Osborn Coho, MD;  Location: [facility] OR;  Service: ENT;  Laterality: Bilateral;   NONINVASIVE VASCULAR CAROTID STUDY  09/14/2007   BILATERAL MILD MIX PLAQUE THROUGHOUT, NO SIGNIFICANT BILATERAL ICA STENOSIS   PUBOVAGINAL SLING  12/11/2011   Procedure: Leonides Grills;  Surgeon: Garnett Farm, MD;  Location[facility];  Service: Urology;  Laterality: N/A;   PULLEY RELEASE RIGHT THUMB  2009   RADIOACTIVE SEED GUIDED EXCISIONAL BREAST BIOPSY Right 03/10/2023   Procedure: RADIOACTIVE SEED GUIDED EXCISIONAL RIGHT BREAST BIOPSY;  Surgeon: Emelia Loron, MD;  Location: [facility] OR;  Service: General;  Laterality: Right;   RE-EXCISION OF BREAST LUMPECTOMY Right 03/30/2023   Procedure: RE-EXCISION OF RIGHT BREAST  LUMPECTOMY;  Surgeon: Emelia Loron, MD;  Location: [facility];  Service: General;  Laterality: Right;   SHOULDER ARTHROSCOPY DISTAL CLAVICLE EXCISION AND OPEN ROTATOR CUFF REPAIR Right 01-17-2019   dr supple @[facility]    SINUS ENDO WITH FUSION Bilateral 11/18/2016   Procedure: BILATERAL ENDOSCOPIC SINUS SURGERY;  Surgeon: Osborn Coho, MD;  Location: [facility] OR;  Service: ENT;  Laterality: Bilateral;   UMBILICAL HERNIA REPAIR N/A 03/25/2021   Procedure: Primary REPAIR UMBILICAL HERNIA;  Surgeon: Emelia Loron, MD;  Location: [facility] OR;  Service: General;  Laterality: N/A;   UPPER GASTROINTESTINAL ENDOSCOPY     VAGINAL HYSTERECTOMY  AGE 29    Social History   Socioeconomic History   Marital status: Married    Spouse name: Not on file   Number of children: 2   Years of education: Not on file   Highest education level: Not on file  Occupational History   Not on file  Tobacco Use   Smoking status: Never   Smokeless tobacco: Never  Vaping Use   Vaping Use: Never used  Substance and Sexual Activity   Alcohol use: No   Drug use: No   Sexual activity: Not on file  Other Topics Concern   Not on file  Social History Narrative   Lives with husband.    Retired Environmental health practitioner   Has 2 children.   Social Determinants of Health   Financial Resource Strain: Not on file  Food Insecurity: No Food Insecurity (03/16/2023)   Hunger Vital Sign    Worried About Running Out of Food in the Last Year: Never true    Ran Out of Food in the Last Year: Never true  Transportation Needs: No Transportation Needs (03/16/2023)   PRAPARE - Administrator, Civil Service (Medical): No    Lack of Transportation (Non-Medical): No  Physical Activity: Not on file  Stress: Not on file  Social Connections: Not on file  Intimate Partner Violence: Not At Risk (03/16/2023)   Humiliation, Afraid, Rape, and Kick questionnaire    Fear of Current or Ex-Partner: No    Emotionally  Abused: No    Physically Abused: No    Sexually Abused: No    Family History  Adopted: Yes  Problem Relation Age of Onset   Diabetes Mother    Stroke Mother    Alzheimer's disease Mother    Hypertension Sister    Diabetes Sister    Heart attack Brother    Leukemia Brother    CAD Brother    Diabetes Brother    Colon cancer Maternal Aunt    Stroke Paternal Grandmother    Heart attack Paternal Grandfather    Esophageal cancer Neg Hx    Rectal cancer Neg Hx  Stomach cancer Neg Hx      Current Outpatient Medications:    acetaminophen (TYLENOL) 650 MG CR tablet, Take 650 mg by mouth every 8 (eight) hours as needed for pain., Disp: , Rfl:    chlorpheniramine-HYDROcodone (TUSSIONEX) 10-8 MG/5ML, TAKE BY MOUTH EVERY 12 HOURS AS NEEDED FOR COUGH, Disp: , Rfl:    citalopram (CELEXA) 10 MG tablet, Take 5 mg by mouth in the morning and at bedtime., Disp: , Rfl:    clonazePAM (KLONOPIN) 0.5 MG tablet, Take 0.5 mg by mouth at bedtime., Disp: , Rfl:    cyclobenzaprine (FLEXERIL) 10 MG tablet, Take 1 tablet (10 mg total) by mouth 3 (three) times daily as needed for muscle spasms. (Patient taking differently: Take 10 mg by mouth at bedtime as needed for muscle spasms.), Disp: 50 tablet, Rfl: 1   dicyclomine (BENTYL) 10 MG capsule, TAKE 1 CAPSULE (10 MG TOTAL) BY MOUTH 4 (FOUR) TIMES DAILY AS NEEDED FOR SPASMS., Disp: 540 capsule, Rfl: 0   estradiol (ESTRACE) 0.1 MG/GM vaginal cream, Apply a pea-sized amount to fingertip and wipe in vaginal introitus twice weekly, Disp: 42.5 g, Rfl: 1   famotidine (PEPCID) 40 MG tablet, Take 40 mg by mouth at bedtime., Disp: , Rfl:    fexofenadine (ALLEGRA) 180 MG tablet, Take 180 mg by mouth in the morning., Disp: , Rfl:    fluticasone (FLONASE) 50 MCG/ACT nasal spray, Place 1 spray into both nostrils daily as needed for allergies or rhinitis., Disp: , Rfl:    guaiFENesin (MUCINEX) 600 MG 12 hr tablet, Take 600 mg by mouth in the morning., Disp: , Rfl:     hydrochlorothiazide (HYDRODIURIL) 25 MG tablet, Take 1 tablet (25 mg total) by mouth daily., Disp:  , Rfl:    losartan (COZAAR) 25 MG tablet, Take 25 mg by mouth in the morning., Disp: , Rfl:    methocarbamol (ROBAXIN) 500 MG tablet, Take 500 mg by mouth every 8 (eight) hours as needed for muscle spasms., Disp: , Rfl:    ondansetron (ZOFRAN) 4 MG tablet, Take 4 mg by mouth every 8 (eight) hours as needed for nausea or vomiting., Disp: , Rfl:    oxyCODONE (OXY IR/ROXICODONE) 5 MG immediate release tablet, Take 1 tablet (5 mg total) by mouth every 6 (six) hours as needed., Disp: 10 tablet, Rfl: 0   pantoprazole (PROTONIX) 40 MG tablet, TAKE 1 TABLET BY MOUTH TWICE A DAY, Disp: 180 tablet, Rfl: 2   potassium chloride (MICRO-K) 10 MEQ CR capsule, Take 20 mEq by mouth in the morning., Disp: , Rfl:    predniSONE (DELTASONE) 10 MG tablet, 60mg  x 2 days, 50mg  x 2 days, 40mg  x 2 days, 30mg  x 2 days, 20mg  x 2 days, 10mg  x 2 days, Disp: , Rfl:    Probiotic Product (ALIGN PO), Take 1 capsule by mouth in the morning., Disp: , Rfl:    promethazine (PHENERGAN) 25 MG tablet, Take 1 tablet (25 mg total) by mouth every 6 (six) hours as needed., Disp: 30 tablet, Rfl: 0   promethazine (PHENERGAN) 25 MG tablet, Take 1 tablet by mouth every 6 (six) hours as needed., Disp: , Rfl:    sodium chloride (OCEAN) 0.65 % SOLN nasal spray, Place 1 spray into both nostrils 3 (three) times daily as needed for congestion., Disp: , Rfl:    tamoxifen (NOLVADEX) 10 MG tablet, Take 0.5 tablets (5 mg total) by mouth daily. Patient just needs to take 1/2 tablet daily, Disp: 15 tablet, Rfl: 3  traMADol (ULTRAM) 50 MG tablet, Take 50 mg by mouth every 6 (six) hours as needed for moderate pain., Disp: , Rfl:    traMADol (ULTRAM) 50 MG tablet, Take 1 tablet (50 mg total) by mouth every 6 (six) hours as needed., Disp: 10 tablet, Rfl: 0   traMADol (ULTRAM) 50 MG tablet, 2 (two) times daily., Disp: , Rfl:    traMADol (ULTRAM) 50 MG tablet, Take 1  tablet (50 mg total) by mouth every 6 (six) hours as needed for moderate pain or severe pain., Disp: 10 tablet, Rfl: 0   traZODone (DESYREL) 50 MG tablet, Take 50 mg by mouth at bedtime., Disp: , Rfl:   Physical exam:  Vitals:   04/19/23 0955  BP: 138/67  Pulse: 64  Resp: 18  Temp: 97.9 F (36.6 C)  TempSrc: Tympanic  SpO2: 100%  Weight: 171 lb 14.4 oz (78 kg)   Physical Exam Cardiovascular:     Rate and Rhythm: Normal rate and regular rhythm.     Heart sounds: Normal heart sounds.  Pulmonary:     Effort: Pulmonary effort is normal.     Breath sounds: Normal breath sounds.  Abdominal:     General: Bowel sounds are normal.     Palpations: Abdomen is soft.  Skin:    General: Skin is warm and dry.  Neurological:     Mental Status: She is alert and oriented to person, place, and time.         Latest Ref Rng & Units 03/03/2023    2:00 PM  CMP  Glucose 70 - 99 mg/dL 161   BUN 8 - 23 mg/dL 7   Creatinine 0.96 - 0.45 mg/dL 4.09   Sodium 811 - 914 mmol/L 135   Potassium 3.5 - 5.1 mmol/L 3.8   Chloride 98 - 111 mmol/L 95   CO2 22 - 32 mmol/L 29   Calcium 8.9 - 10.3 mg/dL 9.1       Latest Ref Rng & Units 03/03/2023    2:00 PM  CBC  WBC 4.0 - 10.5 K/uL 5.7   Hemoglobin 12.0 - 15.0 g/dL 78.2   Hematocrit 95.6 - 46.0 % 37.5   Platelets 150 - 400 K/uL 231     No images are attached to the encounter.  No results found.   Assessment and plan- Patient is a 77 y.o. female with right breast DCIS ER positive here to discuss further management  Patient is s/p reexcision surgery with negative margins for right breast DCIS.  She was deemed not to require adjuvant radiation therapy due to her age.  I think it would be reasonable to try adjuvant endocrine therapy with ER positive DCIS.  Her 5-year and 10-year risk of recurrence without radiation and without endocrine therapy is 11 and 18% respectively.Typically the dose of tamoxifen with DCIS is 20 mg for 5 years.  I did discuss the  TAM 01 study which was a randomized placebo controlled trial of low-dose tamoxifen which was given at 5 mg once daily for 3 years in women with intraepithelial neoplasia which showed there was 50% reduction in the risk of recurrence with tamoxifen and the DCIS cohort and 70% in the overall population.  In this study 69% of women had DCIS.  Although this was not compared head-to-head with 20 mg dosing I think it would be a good start and then we can decide if we really want to bump her dosing up patient would like to start taking the drug  after a couple of weeks.  I will see her back in 4 months with labs   Visit Diagnosis 1. Ductal carcinoma in situ (DCIS) of right breast      Dr. Owens Shark, MD, MPH [facility] [phone] 04/19/2023 1:02 PM

## 2023-04-27 DIAGNOSIS — T8149XA Infection following a procedure, other surgical site, initial encounter: Secondary | ICD-10-CM | POA: Diagnosis not present

## 2023-04-27 DIAGNOSIS — I10 Essential (primary) hypertension: Secondary | ICD-10-CM | POA: Diagnosis not present

## 2023-04-27 DIAGNOSIS — D0511 Intraductal carcinoma in situ of right breast: Secondary | ICD-10-CM | POA: Diagnosis not present

## 2023-04-27 DIAGNOSIS — F419 Anxiety disorder, unspecified: Secondary | ICD-10-CM | POA: Diagnosis not present

## 2023-04-27 DIAGNOSIS — F39 Unspecified mood [affective] disorder: Secondary | ICD-10-CM | POA: Diagnosis not present

## 2023-04-27 DIAGNOSIS — K219 Gastro-esophageal reflux disease without esophagitis: Secondary | ICD-10-CM | POA: Diagnosis not present

## 2023-04-27 DIAGNOSIS — E7800 Pure hypercholesterolemia, unspecified: Secondary | ICD-10-CM | POA: Diagnosis not present

## 2023-04-27 DIAGNOSIS — G4733 Obstructive sleep apnea (adult) (pediatric): Secondary | ICD-10-CM | POA: Diagnosis not present

## 2023-05-06 ENCOUNTER — Other Ambulatory Visit: Payer: HMO

## 2023-05-06 ENCOUNTER — Ambulatory Visit (INDEPENDENT_AMBULATORY_CARE_PROVIDER_SITE_OTHER): Payer: HMO | Admitting: Internal Medicine

## 2023-05-06 ENCOUNTER — Encounter: Payer: Self-pay | Admitting: Internal Medicine

## 2023-05-06 ENCOUNTER — Ambulatory Visit: Payer: HMO | Admitting: Internal Medicine

## 2023-05-06 VITALS — BP 136/50 | HR 84 | Ht 64.0 in | Wt 172.1 lb

## 2023-05-06 DIAGNOSIS — Z86010 Personal history of colonic polyps: Secondary | ICD-10-CM | POA: Diagnosis not present

## 2023-05-06 DIAGNOSIS — K219 Gastro-esophageal reflux disease without esophagitis: Secondary | ICD-10-CM | POA: Diagnosis not present

## 2023-05-06 DIAGNOSIS — R159 Full incontinence of feces: Secondary | ICD-10-CM | POA: Diagnosis not present

## 2023-05-06 DIAGNOSIS — Z9049 Acquired absence of other specified parts of digestive tract: Secondary | ICD-10-CM | POA: Diagnosis not present

## 2023-05-06 DIAGNOSIS — R197 Diarrhea, unspecified: Secondary | ICD-10-CM

## 2023-05-06 DIAGNOSIS — K580 Irritable bowel syndrome with diarrhea: Secondary | ICD-10-CM

## 2023-05-06 DIAGNOSIS — R110 Nausea: Secondary | ICD-10-CM | POA: Diagnosis not present

## 2023-05-06 MED ORDER — ONDANSETRON HCL 4 MG PO TABS: 4.0000 mg | ORAL_TABLET | ORAL | 3 refills | Status: DC | PRN

## 2023-05-06 MED ORDER — PROMETHAZINE HCL 25 MG PO TABS: 25.0000 mg | ORAL_TABLET | Freq: Four times a day (QID) | ORAL | 3 refills | Status: DC | PRN

## 2023-05-06 NOTE — Patient Instructions (Addendum)
We have sent  medications to your pharmacy for you to pick up at your convenience.  Stop the stevia per Dr Leone Payor.   Your provider has requested that you go to the basement level for lab work before leaving today. Press "B" on the elevator. The lab is located at the first door on the left as you exit the elevator.  _______________________________________________________  If your blood pressure at your visit was 140/90 or greater, please contact your primary care physician to follow up on this.  _______________________________________________________  If you are age 76 or older, your body mass index should be between 23-30. Your Body mass index is 29.55 kg/m. If this is out of the aforementioned range listed, please consider follow up with your Primary Care Provider.  If you are age 52 or younger, your body mass index should be between 19-25. Your Body mass index is 29.55 kg/m. If this is out of the aformentioned range listed, please consider follow up with your Primary Care Provider.   ________________________________________________________  The [facility] providers would like to encourage you to use [facility] to communicate with providers for non-urgent requests or questions.  Due to long hold times on the telephone, sending your provider a message by [facility] may be a faster and more efficient way to get a response.  Please allow 48 business hours for a response.  Please remember that this is for non-urgent requests.  _______________________________________________________   I appreciate the opportunity to care for you. Stan Head, MD, [facility]

## 2023-05-06 NOTE — Progress Notes (Signed)
Amy Vang 76 y.o. 1945/12/25 [id number]  Assessment & Plan:   Encounter Diagnoses  Name Primary?   Diarrhea, unspecified type Yes   Irritable bowel syndrome with diarrhea    Full incontinence of feces    S/P cholecystectomy    Gastroesophageal reflux disease, unspecified whether esophagitis present    Chronic nausea    Hx of adenomatous colonic polyps     Orders Placed This Encounter  Procedures   Calprotectin, Fecal   [id number]   Meds ordered this encounter  Medications   promethazine (PHENERGAN) 25 MG tablet    Sig: Take 1 tablet (25 mg total) by mouth every 6 (six) hours as needed.    Dispense:  30 tablet    Refill:  3   ondansetron (ZOFRAN) 4 MG tablet    Sig: Take 1 tablet (4 mg total) by mouth as needed for nausea or vomiting.    Dispense:  20 tablet    Refill:  3   She is to stop Stevia to see if that has an effect.  In general I do not think that causes bad diarrhea but sometimes there are sugar alcohol additives to the sweetener that could cause problems.  It is worth a try.  Further plans pending the results above.  Promethazine and ondansetron were refilled, she takes these for her chronic intermittent nausea episodes.  She did have an episode of vomiting recently but in general those have been rare.  She is not keen on repeating a colonoscopy.  She may have an indication because of history of polyps, however to forego it.  I think it would depend on what happens with her evaluation above and her symptoms. Subjective:   Chief Complaint: Diarrhea and fecal incontinence  HPI Amy Vang is a 77 year old white woman with a history of functional GI disturbances that include chronic epigastric pain and nausea with some vomiting as well as IBS-D.  Previously followed by Dr. Janae Bridgeman God and now me since September 2022.  She is also status postcholecystectomy.  She tells me that in March of this year she had a month of diarrhea problems, she was on a soft bland  diet.  Eventually that subsided but she is still having episodes of diarrhea often with unannounced or urgency to stool and incontinence of "hot liquid" at times.  There is an increase in her gastrocolic reflex.  She had a cholecystectomy in 2022, she did not have diarrhea after it initially.  Maybe sometime later she had intermittent diarrhea.  She is not 100% certain.  She does use Stevia but no other artificial sweeteners.  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  She does not have urinary incontinence since having a sling procedure from Dr. Vernie Ammons a number of years ago.  She did have some history of pelvic floor PT related to problems prior to that.  She has soft formed bowel movements generally outside of the several times a month episodes described above.  She has not had any recent antibiotics other than perioperative antibiotics related to 2 lumpectomy procedures by Dr. Adrian Blackwater in April and a short course of amoxicillin or Augmentin.  However the antibiotic usage postdates onset of diarrhea symptoms.  Her last colonoscopy was in 2019 and there was 1 small adenoma.  A 5-year recall was recommended by Dr. Ewing Schlein at that point apparently there was a prior history of polyps as well.  The 5-year anniversary is in the fall.  Wt Readings from Last 3 Encounters:  05/06/23 172 lb 2 oz (78.1 kg)  04/19/23 171 lb 14.4 oz (78 kg)  03/30/23 175 lb 4.3 oz (79.5 kg)  EGD 10/14/2021 Impression:               - Tortuous esophagus.                           - Erythematous mucosa in the antrum. Biopsied.  Reactive gastropathy                           - A single gastric polyp. Biopsied.  Hyperplastic polyp                           - Small sliding hiatal hernia.                           - The examination was otherwise normal. Allergies  Allergen Reactions   Nsaids      Abdominal Pain   Ambien [Zolpidem] Other (See Comments)    groggy   Hydroxyzine Hcl     UNSPECIFIED REACTION    Lactose Intolerance  (Gi) Other (See Comments)    bloating   Septra [Sulfamethoxazole-Trimethoprim] Rash   Current Meds  Medication Sig   guaiFENesin (MUCINEX) 600 MG 12 hr tablet Take 600 mg by mouth as needed.   ondansetron (ZOFRAN) 4 MG tablet Take 4 mg by mouth as needed for nausea or vomiting.   Past Medical History:  Diagnosis Date   Allergic rhinitis    Allergy    seasonal   Anemia    Anxiety    Cancer (HCC)    Cataract    removed bilateraly   Chronic constipation    COVID-19 08/2019   Degenerative disc disease, cervical    Essential hypertension    GERD (gastroesophageal reflux disease) 1957   H/O bronchitis    History of echocardiogram    in epic 04-21-2019,  normal w/ ef 60-65% and mild mitral regurg, no stenosis or restriction   History of TIAs    per pt age 71;  2008;  05/ 2013;  pt stated no residual was from sleep doctor more than likely sleep deprived than actual tia   Motion sickness    cars   Nocturia    OSA on CPAP    Severe ,, CPAP 12 MM H2O- Dr Mayford Knife   PONV (postoperative nausea and vomiting)    Restless leg syndrome    Rosacea    Seasonal asthma    no inhaler   Sleep apnea    uses CPAP   Stroke (HCC) 1985   TIAx2   Past Surgical History:  Procedure Laterality Date   ANTERIOR CERVICAL DECOMP/DISCECTOMY FUSION N/A 10/25/2017   Procedure: CERVICAL SIX- CERVICAL SEVEN ANTERIOR CERVICAL DECOMPRESSION/DISCECTOMY FUSION, INTERBODY PROSTHESIS AND ANTERIOR PLATING;  Surgeon: Tressie Stalker, MD;  Location: [facility] OR;  Service: Neurosurgery;  Laterality: N/A;  CERVICAL 6- CERVICAL 7 ANTERIOR CERVICAL DECOMPRESSION/DISCECTOMY FUSION, INTERBODY PROSTHESIS AND ANTERIOR PLATING   BACK SURGERY  04/2022   BILATERAL BENIGN BREAST BX'S  20 YRS AGO   BREAST BIOPSY Right    benign   BREAST BIOPSY Right 01/27/2023   MM RT BREAST BX W LOC DEV 1ST LESION IMAGE BX SPEC STEREO GUIDE 01/27/2023 [facility]   BREAST BIOPSY Right 02/05/2023   MM RT BREAST  BX W LOC DEV 1ST LESION IMAGE  BX SPEC STEREO GUIDE 02/05/2023 [facility]   BREAST BIOPSY  03/09/2023   MM RT RADIOACTIVE SEED LOC MAMMO GUIDE 03/09/2023 [facility]   BREAST BIOPSY  03/09/2023   MM RT RADIOACTIVE SEED EA ADD LESION LOC MAMMO GUIDE 03/09/2023 [facility]   BREAST EXCISIONAL BIOPSY Bilateral over 10 years ago   benign   BREAST LUMPECTOMY WITH RADIOACTIVE SEED LOCALIZATION Right 03/10/2023   Procedure: RIGHT BREAST LUMPECTOMY WITH RADIOACTIVE SEED LOCALIZATION;  Surgeon: Emelia Loron, MD;  Location: [facility] OR;  Service: General;  Laterality: Right;   CARDIAC CATHETERIZATION N/A 08/13/2016   Procedure: Right Heart Cath;  Surgeon: Laurey Morale, MD;  Location: [facility]VAS[facility] CV LAB;  Service: Cardiovascular;  Laterality: N/A;    normal study, no evidence pulm HTN   CARPAL TUNNEL RELEASE  2009   BILATERAL   CATARACT EXTRACTION W/PHACO Right 02/27/2020   Procedure: CATARACT EXTRACTION PHACO AND INTRAOCULAR LENS PLACEMENT (IOC) RIGHT 3.60 00:23.9;  Surgeon: Galen Manila, MD;  Location: [facility];  Service: Ophthalmology;  Laterality: Right;  sleep apnea   CATARACT EXTRACTION W/PHACO Left 05/21/2020   Procedure: CATARACT EXTRACTION PHACO AND INTRAOCULAR LENS PLACEMENT (IOC) LEFT 3.69 00:30.9;  Surgeon: Galen Manila, MD;  Location: [facility];  Service: Ophthalmology;  Laterality: Left;  sleep apnea   CHOLECYSTECTOMY N/A 03/25/2021   Procedure: LAPAROSCOPIC CHOLECYSTECTOMY;  Surgeon: Emelia Loron, MD;  Location: [facility] OR;  Service: General;  Laterality: N/A;   CYSTOCELE REPAIR  12/11/2011   Procedure: ANTERIOR REPAIR (CYSTOCELE);  Surgeon: Garnett Farm, MD;  Location[facility];  Service: Urology;  Laterality: N/A;  1 1/2 hour requested for case  Anterior repair and mid Urethral Sling   INCISIONAL HERNIA REPAIR N/A 12/24/2021   Procedure: LAP ASSISTED INCISIONAL HERNIA REPAIR WITH MESH;  Surgeon: Emelia Loron, MD;  Location: [facility] OR;  Service:  General;  Laterality: N/A;  GEN & TAP BLOCK RNFA   MASS EXCISION N/A 07/24/2019   Procedure: EXCISION OF VAGINAL FOREIGN BODY;  Surgeon: Ihor Gully, MD;  Location: [facility];  Service: Urology;  Laterality: N/A;   NASAL SEPTOPLASTY W/ TURBINOPLASTY Bilateral 11/18/2016   Procedure: NASAL SEPTOPLASTY WITH BILATERAL TURBINATE REDUCTION;  Surgeon: Osborn Coho, MD;  Location: [facility] OR;  Service: ENT;  Laterality: Bilateral;   NONINVASIVE VASCULAR CAROTID STUDY  09/14/2007   BILATERAL MILD MIX PLAQUE THROUGHOUT, NO SIGNIFICANT BILATERAL ICA STENOSIS   PUBOVAGINAL SLING  12/11/2011   Procedure: Leonides Grills;  Surgeon: Garnett Farm, MD;  Location: [facility];  Service: Urology;  Laterality: N/A;   PULLEY RELEASE RIGHT THUMB  2009   RADIOACTIVE SEED GUIDED EXCISIONAL BREAST BIOPSY Right 03/10/2023   Procedure: RADIOACTIVE SEED GUIDED EXCISIONAL RIGHT BREAST BIOPSY;  Surgeon: Emelia Loron, MD;  Location: [facility] OR;  Service: General;  Laterality: Right;   RE-EXCISION OF BREAST LUMPECTOMY Right 03/30/2023   Procedure: RE-EXCISION OF RIGHT BREAST LUMPECTOMY;  Surgeon: Emelia Loron, MD;  Location: [facility];  Service: General;  Laterality: Right;   SHOULDER ARTHROSCOPY DISTAL CLAVICLE EXCISION AND OPEN ROTATOR CUFF REPAIR Right 01-17-2019   dr supple @[facility]    SINUS ENDO WITH FUSION Bilateral 11/18/2016   Procedure: BILATERAL ENDOSCOPIC SINUS SURGERY;  Surgeon: Osborn Coho, MD;  [facility];  Service: ENT;  Laterality: Bilateral;   UMBILICAL HERNIA REPAIR N/A 03/25/2021   Procedure: Primary REPAIR UMBILICAL HERNIA;  Surgeon: Emelia Loron, MD;  Location: [facility] OR;  Service: General;  Laterality: N/A;   UPPER GASTROINTESTINAL ENDOSCOPY     VAGINAL HYSTERECTOMY  AGE 33   Social History   Social History Narrative   Lives with husband.    Retired Environmental health practitioner   Has 2 children.   family history includes Alzheimer's  disease in her mother; CAD in her brother; Colon cancer in her maternal aunt; Diabetes in her brother, mother, and sister; Heart attack in her brother and paternal grandfather; Hypertension in her sister; Leukemia in her brother; Stroke in her mother and paternal grandmother. She was adopted.   Review of Systems See above  Objective:   Physical Exam @BP  (!) 136/50 (BP Location: Left Arm, Patient Position: Sitting, Cuff Size: Normal)   Pulse 84   Ht 5\' 4"  (1.626 m) Comment: height measured without shoes  Wt 172 lb 2 oz (78.1 kg)   BMI 29.55 kg/m @  General:  NAD Eyes:   anicteric Lungs:  clear Heart::  S1S2 no rubs, murmurs or gallops Abdomen:  soft and nontender, BS+  Patti [place], CMA present.   Anoderm inspection revealed noabnormalities  Digital exam revealed mildly decreasedresting tone and significantly reduced voluntary squeeze. Gluteal squeeze as opposed to anal noticed. small rectocele present. Simulated defecation with valsalva revealed appropriate abdominal contraction and some exaggerated descent.     Data Reviewed:  See HPI

## 2023-05-12 ENCOUNTER — Ambulatory Visit: Payer: HMO

## 2023-05-12 DIAGNOSIS — R197 Diarrhea, unspecified: Secondary | ICD-10-CM

## 2023-05-17 ENCOUNTER — Encounter: Payer: Self-pay | Admitting: Internal Medicine

## 2023-05-19 LAB — 7ALPHAC4: 7AlphaC4: 3.7 ng/mL

## 2023-05-21 LAB — CALPROTECTIN, FECAL: Calprotectin, Fecal: 97 ug/g (ref 0–120)

## 2023-05-23 ENCOUNTER — Other Ambulatory Visit: Payer: Self-pay | Admitting: Internal Medicine

## 2023-05-23 DIAGNOSIS — K219 Gastro-esophageal reflux disease without esophagitis: Secondary | ICD-10-CM

## 2023-05-28 ENCOUNTER — Encounter: Payer: Self-pay | Admitting: Internal Medicine

## 2023-06-01 ENCOUNTER — Other Ambulatory Visit: Payer: Self-pay | Admitting: *Deleted

## 2023-06-01 ENCOUNTER — Encounter: Payer: Self-pay | Admitting: Oncology

## 2023-06-01 MED ORDER — TAMOXIFEN CITRATE 10 MG PO TABS: 10.0000 mg | ORAL_TABLET | Freq: Every day | ORAL | 2 refills | Status: DC

## 2023-06-02 ENCOUNTER — Ambulatory Visit: Payer: BC Managed Care – PPO | Admitting: Podiatry

## 2023-06-24 DIAGNOSIS — R051 Acute cough: Secondary | ICD-10-CM | POA: Diagnosis not present

## 2023-06-24 DIAGNOSIS — J069 Acute upper respiratory infection, unspecified: Secondary | ICD-10-CM | POA: Diagnosis not present

## 2023-06-24 DIAGNOSIS — E871 Hypo-osmolality and hyponatremia: Secondary | ICD-10-CM | POA: Diagnosis not present

## 2023-06-28 DIAGNOSIS — G4733 Obstructive sleep apnea (adult) (pediatric): Secondary | ICD-10-CM | POA: Diagnosis not present

## 2023-07-03 DIAGNOSIS — G4733 Obstructive sleep apnea (adult) (pediatric): Secondary | ICD-10-CM | POA: Diagnosis not present

## 2023-07-05 ENCOUNTER — Ambulatory Visit: Payer: Medicare Other | Admitting: Neurology

## 2023-07-13 ENCOUNTER — Other Ambulatory Visit
Admission: RE | Admit: 2023-07-13 | Discharge: 2023-07-13 | Disposition: A | Discharge status: Home / Self Care | Payer: HMO | Source: Ambulatory Visit | Attending: Pulmonary Disease | Admitting: Pulmonary Disease

## 2023-07-13 ENCOUNTER — Ambulatory Visit (INDEPENDENT_AMBULATORY_CARE_PROVIDER_SITE_OTHER): Payer: HMO | Admitting: Pulmonary Disease

## 2023-07-13 ENCOUNTER — Encounter: Payer: Self-pay | Admitting: Pulmonary Disease

## 2023-07-13 VITALS — BP 122/78 | HR 63 | Temp 98.1°F | Ht 64.0 in | Wt 174.0 lb

## 2023-07-13 DIAGNOSIS — J4540 Moderate persistent asthma, uncomplicated: Secondary | ICD-10-CM

## 2023-07-13 DIAGNOSIS — R053 Chronic cough: Secondary | ICD-10-CM | POA: Diagnosis not present

## 2023-07-13 DIAGNOSIS — K219 Gastro-esophageal reflux disease without esophagitis: Secondary | ICD-10-CM

## 2023-07-13 DIAGNOSIS — J329 Chronic sinusitis, unspecified: Secondary | ICD-10-CM | POA: Diagnosis not present

## 2023-07-13 LAB — NITRIC OXIDE: Nitric Oxide: 27

## 2023-07-13 MED ORDER — ARNUITY ELLIPTA 100 MCG/ACT IN AEPB: 1.0000 | INHALATION_SPRAY | Freq: Every day | RESPIRATORY_TRACT | 6 refills | Status: DC

## 2023-07-13 MED ORDER — ALBUTEROL SULFATE HFA 108 (90 BASE) MCG/ACT IN AERS: 2.0000 | INHALATION_SPRAY | Freq: Four times a day (QID) | RESPIRATORY_TRACT | 2 refills | Status: DC | PRN

## 2023-07-13 NOTE — Patient Instructions (Addendum)
We have started an inhaler called Arnuity Ellipta this is 1 puff daily.  Make sure you rinse your mouth well after you use it.  We have sent in a prescription for albuterol this will be to use as needed for chest tightness or shortness of breath.  We are going to get breathing tests.  We are going to get blood test to check for allergy.  We will see him in follow-up in 4 to 6 weeks time call sooner should any new problems arise.

## 2023-07-13 NOTE — Progress Notes (Signed)
Subjective:    Patient ID: Amy Vang, female    DOB: 1946-09-09, 77 y.o.   MRN: [id number]  Patient Care Team: Merri Brunette, MD as PCP - [facility])  Chief Complaint  Patient presents with   Consult    3 times a year she gets a cough, hoarseness, sore throat, and swollen glands. Dry cough. Little wheezing. Chest tightness. DOE. Diagnosed with asthma in 2018.   HPI Amy Vang is a 77 year old lifelong never smoker who presents for the issue of cough, hoarseness and wheezing that occurs 2-3 times per year.  Patient also notes throat clearing pretty much year-round.  She is self-referred.  Her primary care physician is Dr. Merri Brunette.  The patient states that she has had issues for a number of years (6 to 8 years).  Of note she had previously been evaluated for this issue by Dr. Marchelle Gearing in 2017.  She last saw Rubye Oaks, NP for this issue on 16 February 2017.  She had had normal PFTs at that time.  She has previously been given a diagnosis of asthma.  She has been evaluated by Dr. Eileen Stanford as well.  Dr. Gary Fleet had told her in the past that she had allergies to mold and trees.  Previously she had tried maintenance inhalers but did not feel that these helped her however she was also not consistent using them.  She does note that albuterol helps her symptoms.  She has issues with chronic nasal congestion and sneezing.  She uses Flonase which helps.  He occasionally gets chest tightness and this is relieved by use of albuterol.  She has had issues with gastroesophageal reflux and heartburn.  She is currently on Protonix which helps some of this.  Has not had any fevers, chills or sweats.  Currently only on albuterol inhaler as noted.  No maintenance inhaler.  All histories have been reviewed.  Review of Systems A 10 point review of systems was performed and it is as noted above otherwise negative.   Past Medical History:  Diagnosis Date   Allergic rhinitis     Allergy    seasonal   Anemia    Anxiety    Cancer (HCC)    Cataract    removed bilateraly   Chronic constipation    COVID-19 08/2019   Degenerative disc disease, cervical    Essential hypertension    GERD (gastroesophageal reflux disease) 1957   H/O bronchitis    History of echocardiogram    in epic 04-21-2019,  normal w/ ef 60-65% and mild mitral regurg, no stenosis or restriction   History of TIAs    per pt age 40;  2008;  05/ 2013;  pt stated no residual was from sleep doctor more than likely sleep deprived than actual tia   Motion sickness    cars   Nocturia    OSA on CPAP    Severe ,, CPAP 12 MM H2O- Dr Mayford Knife   PONV (postoperative nausea and vomiting)    Restless leg syndrome    Rosacea    Seasonal asthma    no inhaler   Sleep apnea    uses CPAP   Stroke (HCC) 1985   TIAx2    Past Surgical History:  Procedure Laterality Date   ANTERIOR CERVICAL DECOMP/DISCECTOMY FUSION N/A 10/25/2017   Procedure: CERVICAL SIX- CERVICAL SEVEN ANTERIOR CERVICAL DECOMPRESSION/DISCECTOMY FUSION, INTERBODY PROSTHESIS AND ANTERIOR PLATING;  Surgeon: Tressie Stalker, MD;  Location: [facility] OR;  Service: Neurosurgery;  Laterality: N/A;  CERVICAL 6- CERVICAL 7 ANTERIOR CERVICAL DECOMPRESSION/DISCECTOMY FUSION, INTERBODY PROSTHESIS AND ANTERIOR PLATING   BACK SURGERY  04/2022   BILATERAL BENIGN BREAST BX'S  20 YRS AGO   BREAST BIOPSY Right    benign   BREAST BIOPSY Right 01/27/2023   MM RT BREAST BX W LOC DEV 1ST LESION IMAGE BX SPEC STEREO GUIDE 01/27/2023 [facility]   BREAST BIOPSY Right 02/05/2023   MM RT BREAST BX W LOC DEV 1ST LESION IMAGE BX SPEC STEREO GUIDE 02/05/2023 [facility]   BREAST BIOPSY  03/09/2023   MM RT RADIOACTIVE SEED LOC MAMMO GUIDE 03/09/2023 [facility]   BREAST BIOPSY  03/09/2023   MM RT RADIOACTIVE SEED EA ADD LESION LOC MAMMO GUIDE 03/09/2023 [facility]   BREAST EXCISIONAL BIOPSY Bilateral over 10 years ago   benign   BREAST LUMPECTOMY WITH  RADIOACTIVE SEED LOCALIZATION Right 03/10/2023   Procedure: RIGHT BREAST LUMPECTOMY WITH RADIOACTIVE SEED LOCALIZATION;  Surgeon: Emelia Loron, MD;  Location: [facility] OR;  Service: General;  Laterality: Right;   CARDIAC CATHETERIZATION N/A 08/13/2016   Procedure: Right Heart Cath;  Surgeon: Laurey Morale, MD;  Location: [facility] INVASIVE CV LAB;  Service: Cardiovascular;  Laterality: N/A;    normal study, no evidence pulm HTN   CARPAL TUNNEL RELEASE  2009   BILATERAL   CATARACT EXTRACTION W/PHACO Right 02/27/2020   Procedure: CATARACT EXTRACTION PHACO AND INTRAOCULAR LENS PLACEMENT (IOC) RIGHT 3.60 00:23.9;  Surgeon: Galen Manila, MD;  Location: [facility];  Service: Ophthalmology;  Laterality: Right;  sleep apnea   CATARACT EXTRACTION W/PHACO Left 05/21/2020   Procedure: CATARACT EXTRACTION PHACO AND INTRAOCULAR LENS PLACEMENT (IOC) LEFT 3.69 00:30.9;  Surgeon: Galen Manila, MD;  Location: [facility];  Service: Ophthalmology;  Laterality: Left;  sleep apnea   CHOLECYSTECTOMY N/A 03/25/2021   Procedure: LAPAROSCOPIC CHOLECYSTECTOMY;  Surgeon: Emelia Loron, MD;  Location: [facility] OR;  Service: General;  Laterality: N/A;   CYSTOCELE REPAIR  12/11/2011   Procedure: ANTERIOR REPAIR (CYSTOCELE);  Surgeon: Garnett Farm, MD;  Location: [facility];  Service: Urology;  Laterality: N/A;  1 1/2 hour requested for case  Anterior repair and mid Urethral Sling   INCISIONAL HERNIA REPAIR N/A 12/24/2021   Procedure: LAP ASSISTED INCISIONAL HERNIA REPAIR WITH MESH;  Surgeon: Emelia Loron, MD;  Location: [facility] OR;  Service: General;  Laterality: N/A;  GEN & TAP BLOCK RNFA   MASS EXCISION N/A 07/24/2019   Procedure: EXCISION OF VAGINAL FOREIGN BODY;  Surgeon: Ihor Gully, MD;  Location: [facility];  Service: Urology;  Laterality: N/A;   NASAL SEPTOPLASTY W/ TURBINOPLASTY Bilateral 11/18/2016   Procedure: NASAL SEPTOPLASTY WITH BILATERAL TURBINATE  REDUCTION;  Surgeon: Osborn Coho, MD;  Location: [facility] OR;  Service: ENT;  Laterality: Bilateral;   NONINVASIVE VASCULAR CAROTID STUDY  09/14/2007   BILATERAL MILD MIX PLAQUE THROUGHOUT, NO SIGNIFICANT BILATERAL ICA STENOSIS   PUBOVAGINAL SLING  12/11/2011   Procedure: Leonides Grills;  Surgeon: Garnett Farm, MD;  Location: [facility];  Service: Urology;  Laterality: N/A;   PULLEY RELEASE RIGHT THUMB  2009   RADIOACTIVE SEED GUIDED EXCISIONAL BREAST BIOPSY Right 03/10/2023   Procedure: RADIOACTIVE SEED GUIDED EXCISIONAL RIGHT BREAST BIOPSY;  Surgeon: Emelia Loron, MD;  Location: [facility] OR;  Service: General;  Laterality: Right;   RE-EXCISION OF BREAST LUMPECTOMY Right 03/30/2023   Procedure: RE-EXCISION OF RIGHT BREAST LUMPECTOMY;  Surgeon: Emelia Loron, MD;  Location: [facility];  Service:  General;  Laterality: Right;   SHOULDER ARTHROSCOPY DISTAL CLAVICLE EXCISION AND OPEN ROTATOR CUFF REPAIR Right 01-17-2019   dr supple @[facility]    SINUS ENDO WITH FUSION Bilateral 11/18/2016   Procedure: BILATERAL ENDOSCOPIC SINUS SURGERY;  Surgeon: Osborn Coho, MD;  Location: [facility] OR;  Service: ENT;  Laterality: Bilateral;   UMBILICAL HERNIA REPAIR N/A 03/25/2021   Procedure: Primary REPAIR UMBILICAL HERNIA;  Surgeon: Emelia Loron, MD;  Location: [facility] OR;  Service: General;  Laterality: N/A;   UPPER GASTROINTESTINAL ENDOSCOPY     VAGINAL HYSTERECTOMY  AGE 89    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   Abnormal vaginal bleeding 12/17/2021   Allergic rhinitis 12/17/2021   Adjustment disorder 12/17/2021   Cholelithiasis without obstruction 12/17/2021   Constipation 12/17/2021   Degeneration of lumbar intervertebral disc 12/17/2021   Diverticulosis of sigmoid colon 12/17/2021   Fatty liver 12/17/2021   Functional abdominal pain syndrome 12/17/2021   Gastro-esophageal reflux disease  without esophagitis 12/17/2021   Hypo-osmolality and hyponatremia 12/17/2021   Internal hemorrhoids 12/17/2021   Irritable bowel syndrome without diarrhea 12/17/2021   Lymphocytosis 12/17/2021   Nausea and vomiting 12/17/2021   Overactive bladder 12/17/2021   Personal history of colonic polyps 12/17/2021   Personal history of transient ischemic attack (TIA), and cerebral infarction without residual deficits 12/17/2021   Insomnia 12/17/2021   Pure hypercholesterolemia 12/17/2021   Restless legs syndrome 12/17/2021   Rosacea 12/17/2021   Sleep-wake schedule disorder, 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   Piriformis syndrome of right side 01/01/2021   Lower limb pain, inferior, right 09/19/2020   Lumbosacral radiculitis 09/19/2020   Body mass index (BMI) 25.0-25.9, adult 06/17/2020   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   Annular tear of lumbar disc 01/24/2020   Acute back pain with sciatica 12/28/2019   Encounter for orthopedic follow-up care 01/17/2019   Partial thickness rotator cuff tear 11/07/2018   Acquired trigger finger 10/28/2018   Pain in joint of right shoulder 05/06/2018   Hoarseness 12/22/2017   Cervical spondylosis with radiculopathy 10/25/2017   Neck pain 08/31/2017   Other cervical disc displacement, unspecified cervical region 08/31/2017   Sinusitis, chronic 11/18/2016   Deviated septum 09/18/2016   Dyspnea and respiratory abnormality 05/21/2016   Wheeze 05/21/2016   Chronic throat clearing 05/21/2016   Pulmonary HTN (HCC) 04/23/2016   Moderate persistent asthma 11/12/2015   Allergic rhinoconjunctivitis 11/12/2015   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   SOB (shortness of breath) 03/06/2015   OSA (obstructive sleep apnea) 01/01/2014   Benign essential HTN 01/01/2014   Obesity 01/01/2014    Family History  Adopted: Yes  Problem Relation Age of Onset   Diabetes Mother    Stroke Mother    Alzheimer's disease Mother    Hypertension Sister    Diabetes Sister    Heart  attack Brother    Leukemia Brother    CAD Brother    Diabetes Brother    Colon cancer Maternal Aunt    Stroke Paternal Grandmother    Heart attack Paternal Grandfather    Esophageal cancer Neg Hx    Rectal cancer Neg Hx    Stomach cancer Neg Hx     Social History   Tobacco Use   Smoking status: Never   Smokeless tobacco: Never  Substance Use Topics   Alcohol use: No    Allergies  Allergen Reactions   Nsaids      Abdominal Pain   Ambien [  Zolpidem] Other (See Comments)    groggy   Hydroxyzine Hcl     UNSPECIFIED REACTION    Lactose Intolerance (Gi) Other (See Comments)    bloating   Septra [Sulfamethoxazole-Trimethoprim] Rash    Current Meds  Medication Sig   acetaminophen (TYLENOL) 650 MG CR tablet Take 650 mg by mouth every 8 (eight) hours as needed for pain.   albuterol (VENTOLIN HFA) 108 (90 Base) MCG/ACT inhaler Inhale 1 puff into the lungs every 6 (six) hours as needed for wheezing or shortness of breath.   chlorpheniramine-HYDROcodone (TUSSIONEX) 10-8 MG/5ML Take 5 mLs by mouth as needed.   citalopram (CELEXA) 10 MG tablet Take 5 mg by mouth in the morning and at bedtime.   clonazePAM (KLONOPIN) 0.5 MG tablet Take 0.5 mg by mouth at bedtime.   cyclobenzaprine (FLEXERIL) 10 MG tablet Take 1 tablet (10 mg total) by mouth 3 (three) times daily as needed for muscle spasms. (Patient taking differently: Take 10 mg by mouth at bedtime as needed for muscle spasms.)   dicyclomine (BENTYL) 10 MG capsule TAKE 1 CAPSULE (10 MG TOTAL) BY MOUTH 4 (FOUR) TIMES DAILY AS NEEDED FOR SPASMS.   fexofenadine (ALLEGRA) 180 MG tablet Take 180 mg by mouth in the morning.   fluticasone (FLONASE) 50 MCG/ACT nasal spray Place 1 spray into both nostrils daily as needed for allergies or rhinitis.   guaiFENesin (MUCINEX) 600 MG 12 hr tablet Take 600 mg by mouth as needed.   hydrochlorothiazide (HYDRODIURIL) 25 MG tablet Take 1 tablet (25 mg total) by mouth daily.   losartan (COZAAR) 25 MG  tablet Take 25 mg by mouth in the morning.   methocarbamol (ROBAXIN) 500 MG tablet Take 500 mg by mouth as needed for muscle spasms.   ondansetron (ZOFRAN) 4 MG tablet Take 1 tablet (4 mg total) by mouth as needed for nausea or vomiting.   pantoprazole (PROTONIX) 40 MG tablet TAKE 1 TABLET BY MOUTH TWICE A DAY   potassium chloride (MICRO-K) 10 MEQ CR capsule Take 20 mEq by mouth in the morning.   promethazine (PHENERGAN) 25 MG tablet Take 1 tablet (25 mg total) by mouth every 6 (six) hours as needed.   sodium chloride (OCEAN) 0.65 % SOLN nasal spray Place 1 spray into both nostrils 3 (three) times daily as needed for congestion.   tamoxifen (NOLVADEX) 10 MG tablet Take 1 tablet (10 mg total) by mouth daily.   traMADol (ULTRAM) 50 MG tablet Take 1 tablet (50 mg total) by mouth every 6 (six) hours as needed for moderate pain or severe pain.   traZODone (DESYREL) 50 MG tablet Take 50 mg by mouth at bedtime.    Immunization History  Administered Date(s) Administered   Fluad Quad(high Dose 65+) 08/30/2022   Influenza Split 09/03/2009, 09/17/2010, 09/03/2012, 08/22/2015, 08/11/2019, 01/29/2021   Influenza, High Dose Seasonal PF 09/05/2015, 09/01/2016, 09/06/2017, 10/11/2018, 08/18/2019   Influenza,inj,Quad PF,6+ Mos 08/30/2016   Influenza-Unspecified 08/26/2017   PFIZER(Purple Top)SARS-COV-2 Vaccination 12/23/2019, 01/13/2020, 08/27/2020, 05/06/2021   Pneumococcal Conjugate-13 12/13/2013, 09/01/2016   Pneumococcal Polysaccharide-23 09/28/2011, 09/01/2016   Tdap 03/07/2007, 09/08/2018   Zoster Recombinant(Shingrix) 11/09/2017   Zoster, Live 03/28/2008, 08/20/2017, 11/09/2017        Objective:     BP 122/78 (BP Location: Left Arm, Cuff Size: Normal)   Pulse 63   Temp 98.1 F (36.7 C)   Ht 5\' 4"  (1.626 m)   Wt 174 lb (78.9 kg)   SpO2 96%   BMI 29.87 kg/m   SpO2:  96 % O2 Device: None (Room air)  GENERAL: Well-developed, somewhat overweight woman, no acute distress, fully  ambulatory.  No conversational dyspnea. HEAD: Normocephalic, atraumatic.  EYES: Pupils equal, round, reactive to light.  No scleral icterus.  MOUTH: Teeth intact, oral mucosa moist.  No thrush. NECK: Supple. No thyromegaly. Trachea midline. No JVD.  No adenopathy. PULMONARY: Good air entry bilaterally.  No adventitious sounds. CARDIOVASCULAR: S1 and S2. Regular rate and rhythm.  No rubs, murmurs or gallops heard. ABDOMEN: Benign. MUSCULOSKELETAL: No joint deformity, no clubbing, no edema.  NEUROLOGIC: No overt focal deficit, no gait disturbance, speech is fluent. SKIN: Intact,warm,dry. PSYCH: Mood and behavior normal  Lab Results  Component Value Date   NITRICOXIDE 27 07/13/2023        Assessment & Plan:     ICD-10-CM   1. Moderate persistent asthma without complication  J45.40    Obtain PFTs Trial of albuterol Ellipta 100, 1 inhalation daily Continue as needed albuterol    2. Chronic cough  R05.3 Nitric oxide    Allergen Panel (27) + IGE    Pulmonary Function Test [facility] Only    CBC With Differential    Alpha-1 antitrypsin phenotype   Suspect element of cough variant asthma Poor control of asthma likely etiology Possible component of GERD/LPR    3. Chronic rhinosinusitis  J32.9    See to her management Continue nasal hygiene Continue Flonase as needed    4. Laryngopharyngeal reflux (LPR)  K21.9    Continue management of GERD Antireflux measures     Orders Placed This Encounter  Procedures   Allergen Panel (27) + IGE    Standing Status:   Future    Standing Expiration Date:   01/13/2024   CBC With Differential    Standing Status:   Future    Standing Expiration Date:   01/13/2024   Nitric oxide   Pulmonary Function Test [facility] Only    Standing Status:   Future    Standing Expiration Date:   07/12/2024    Order Specific Question:   Full PFT: includes the following: basic spirometry, spirometry pre & post bronchodilator, diffusion capacity (DLCO), lung volumes     Answer:   Full PFT    Order Specific Question:   This test can only be performed at    Answer:   [facility]    Meds ordered this encounter  Medications   Fluticasone Furoate (ARNUITY ELLIPTA) 100 MCG/ACT AEPB    Sig: Inhale 1 puff into the lungs daily.    Dispense:  30 each    Refill:  6   albuterol (VENTOLIN HFA) 108 (90 Base) MCG/ACT inhaler    Sig: Inhale 2 puffs into the lungs every 6 (six) hours as needed for wheezing or shortness of breath.    Dispense:  8 g    Refill:  2   Will see the patient in follow-up in 4 to 6 weeks time she is to contact us prior to that time should any new difficulties arise.   Gailen Shelter, MD Advanced Bronchoscopy P[facility]    *This note was dictated using voice recognition software/Dragon.  Despite best efforts to proofread, errors can occur which can change the meaning. Any transcriptional errors that result from this process are unintentional and may not be fully corrected at the time of dictation.

## 2023-07-29 ENCOUNTER — Encounter: Payer: Self-pay | Admitting: Internal Medicine

## 2023-08-10 ENCOUNTER — Ambulatory Visit: Payer: HMO | Attending: Pulmonary Disease

## 2023-08-10 DIAGNOSIS — R053 Chronic cough: Secondary | ICD-10-CM | POA: Insufficient documentation

## 2023-08-10 LAB — PULMONARY FUNCTION TEST ARMC ONLY
DL/VA % pred: 80 %
DL/VA: 3.29 ml/min/mmHg/L
DLCO unc % pred: 66 %
DLCO unc: 12.62 ml/min/mmHg
FEF 25-75 Post: 1.41 L/s
FEF 25-75 Pre: 1 L/s
FEF2575-%Change-Post: 40 %
FEF2575-%Pred-Post: 85 %
FEF2575-%Pred-Pre: 60 %
FEV1-%Change-Post: 15 %
FEV1-%Pred-Post: 85 %
FEV1-%Pred-Pre: 73 %
FEV1-Post: 1.8 L
FEV1-Pre: 1.55 L
FEV1FVC-%Change-Post: 11 %
FEV1FVC-%Pred-Pre: 79 %
FEV6-%Change-Post: 6 %
FEV6-%Pred-Post: 101 %
FEV6-%Pred-Pre: 95 %
FEV6-Post: 2.72 L
FEV6-Pre: 2.54 L
FEV6FVC-%Change-Post: 0 %
FEV6FVC-%Pred-Post: 105 %
FEV6FVC-%Pred-Pre: 105 %
FVC-%Change-Post: 4 %
FVC-%Pred-Post: 96 %
FVC-%Pred-Pre: 92 %
FVC-Post: 2.72 L
FVC-Pre: 2.61 L
Post FEV1/FVC ratio: 66 %
Post FEV6/FVC ratio: 100 %
Pre FEV1/FVC ratio: 60 %
Pre FEV6/FVC Ratio: 100 %
RV % pred: 105 %
RV: 2.41 L
TLC % pred: 102 %
TLC: 5.16 L

## 2023-08-10 MED ORDER — ALBUTEROL SULFATE (2.5 MG/3ML) 0.083% IN NEBU
2.5000 mg | INHALATION_SOLUTION | Freq: Once | RESPIRATORY_TRACT | Status: AC
  Administered 2023-08-10: 2.5 mg via RESPIRATORY_TRACT
  Filled 2023-08-10: qty 3

## 2023-08-16 DIAGNOSIS — G4733 Obstructive sleep apnea (adult) (pediatric): Secondary | ICD-10-CM | POA: Diagnosis not present

## 2023-08-18 DIAGNOSIS — M545 Low back pain, unspecified: Secondary | ICD-10-CM | POA: Diagnosis not present

## 2023-08-18 DIAGNOSIS — M5451 Vertebrogenic low back pain: Secondary | ICD-10-CM | POA: Diagnosis not present

## 2023-08-18 DIAGNOSIS — M791 Myalgia, unspecified site: Secondary | ICD-10-CM | POA: Diagnosis not present

## 2023-08-20 ENCOUNTER — Inpatient Hospital Stay (HOSPITAL_BASED_OUTPATIENT_CLINIC_OR_DEPARTMENT_OTHER): Payer: HMO | Admitting: Oncology

## 2023-08-20 ENCOUNTER — Inpatient Hospital Stay: Payer: HMO | Attending: Oncology

## 2023-08-20 ENCOUNTER — Encounter: Payer: Self-pay | Admitting: Oncology

## 2023-08-20 VITALS — BP 139/55 | HR 63 | Temp 98.6°F | Resp 18 | Wt 171.3 lb

## 2023-08-20 DIAGNOSIS — Z86000 Personal history of in-situ neoplasm of breast: Secondary | ICD-10-CM | POA: Diagnosis not present

## 2023-08-20 DIAGNOSIS — Z79810 Long term (current) use of selective estrogen receptor modulators (SERMs): Secondary | ICD-10-CM | POA: Insufficient documentation

## 2023-08-20 DIAGNOSIS — Z08 Encounter for follow-up examination after completed treatment for malignant neoplasm: Secondary | ICD-10-CM

## 2023-08-20 DIAGNOSIS — D0511 Intraductal carcinoma in situ of right breast: Secondary | ICD-10-CM | POA: Insufficient documentation

## 2023-08-20 DIAGNOSIS — Z5181 Encounter for therapeutic drug level monitoring: Secondary | ICD-10-CM

## 2023-08-20 LAB — COMPREHENSIVE METABOLIC PANEL
ALT: 15 U/L (ref 0–44)
AST: 18 U/L (ref 15–41)
Albumin: 4.1 g/dL (ref 3.5–5.0)
Alkaline Phosphatase: 49 U/L (ref 38–126)
Anion gap: 5 (ref 5–15)
BUN: 15 mg/dL (ref 8–23)
CO2: 28 mmol/L (ref 22–32)
Calcium: 8.5 mg/dL — ABNORMAL LOW (ref 8.9–10.3)
Chloride: 102 mmol/L (ref 98–111)
Creatinine, Ser: 0.76 mg/dL (ref 0.44–1.00)
GFR, Estimated: >60 mL/min (ref >60)
Glucose, Bld: 117 mg/dL — ABNORMAL HIGH (ref 70–99)
Potassium: 3.7 mmol/L (ref 3.5–5.1)
Sodium: 135 mmol/L (ref 135–145)
Total Bilirubin: 0.7 mg/dL (ref 0.3–1.2)
Total Protein: 6.8 g/dL (ref 6.5–8.1)

## 2023-08-20 NOTE — Progress Notes (Signed)
Hematology/Oncology Consult note [facility]  [phone] [phone]  Patient Care Team: Merri Brunette, MD as PCP - General (Family Medicine)   Name of the patient: Amy Vang  [id number]  09-01-1946   Date of visit: 08/20/23  Diagnosis-right breast DCIS ER positive  Chief complaint/ Reason for visit-routine follow-up of right breast DCIS  Heme/Onc history: Patient is a 77 year old female diagnosed with right breast DCIS in February 2024.Patient underwent right lumpectomy on 03/10/2023 which showed intermediate nuclear grade cribriform type DCIS without necrosis and negative for invasive carcinoma.  DCIS measures 4 mm in greatest dimension additional medial and superior margin negative for carcinoma margins were negative for DCIS but close at 0.75 mm from the posterior margin.  She underwent reexcision surgery with negative margins.  Given the small size of DCIS and her age adjuvant radiation therapy was not deemed to be necessary.  Patient startedAching tamoxifen and but is taking a lower dose of 10 mg daily since May 2024  Interval history-tolerating 10 mg tamoxifen well without any significant side effects.  Denies any breast concerns other than occasional pain at the lumpectomy site.   ECOG PS- 1 Pain scale- 0  Review of systems- Review of Systems  Constitutional:  Negative for chills, fever, malaise/fatigue and weight loss.  HENT:  Negative for congestion, ear discharge and nosebleeds.   Eyes:  Negative for blurred vision.  Respiratory:  Negative for cough, hemoptysis, sputum production, shortness of breath and wheezing.   Cardiovascular:  Negative for chest pain, palpitations, orthopnea and claudication.  Gastrointestinal:  Negative for abdominal pain, blood in stool, constipation, diarrhea, heartburn, melena, nausea and vomiting.  Genitourinary:  Negative for dysuria, flank pain, frequency, hematuria and urgency.   Musculoskeletal:  Negative for back pain, joint pain and myalgias.  Skin:  Negative for rash.  Neurological:  Negative for dizziness, tingling, focal weakness, seizures, weakness and headaches.  Endo/Heme/Allergies:  Does not bruise/bleed easily.  Psychiatric/Behavioral:  Negative for depression and suicidal ideas. The patient does not have insomnia.       Allergies  Allergen Reactions   Nsaids      Abdominal Pain   Ambien [Zolpidem] Other (See Comments)    groggy   Hydroxyzine Hcl     UNSPECIFIED REACTION    Lactose Intolerance (Gi) Other (See Comments)    bloating   Septra [Sulfamethoxazole-Trimethoprim] Rash     Past Medical History:  Diagnosis Date   Allergic rhinitis    Allergy    seasonal   Anemia    Anxiety    Cancer (HCC)    Cataract    removed bilateraly   Chronic constipation    COVID-19 08/2019   Degenerative disc disease, cervical    Essential hypertension    GERD (gastroesophageal reflux disease) 1957   H/O bronchitis    History of echocardiogram    in epic 04-21-2019,  normal w/ ef 60-65% and mild mitral regurg, no stenosis or restriction   History of TIAs    per pt age 21;  2008;  05/ 2013;  pt stated no residual was from sleep doctor more than likely sleep deprived than actual tia   Motion sickness    cars   Nocturia    OSA on CPAP    Severe ,, CPAP 12 MM H2O- Dr Mayford Knife   PONV (postoperative nausea and vomiting)    Restless leg syndrome    Rosacea    Seasonal asthma    no inhaler  Sleep apnea    uses CPAP   Stroke (HCC) 1985   TIAx2     Past Surgical History:  Procedure Laterality Date   ANTERIOR CERVICAL DECOMP/DISCECTOMY FUSION N/A 10/25/2017   Procedure: CERVICAL SIX- CERVICAL SEVEN ANTERIOR CERVICAL DECOMPRESSION/DISCECTOMY FUSION, INTERBODY PROSTHESIS AND ANTERIOR PLATING;  Surgeon: Tressie Stalker, MD;  Location: [facility] OR;  Service: Neurosurgery;  Laterality: N/A;  CERVICAL 6- CERVICAL 7 ANTERIOR CERVICAL DECOMPRESSION/DISCECTOMY  FUSION, INTERBODY PROSTHESIS AND ANTERIOR PLATING   BACK SURGERY  04/2022   BILATERAL BENIGN BREAST BX'S  20 YRS AGO   BREAST BIOPSY Right    benign   BREAST BIOPSY Right 01/27/2023   MM RT BREAST BX W LOC DEV 1ST LESION IMAGE BX SPEC STEREO GUIDE 01/27/2023 [facility]   BREAST BIOPSY Right 02/05/2023   MM RT BREAST BX W LOC DEV 1ST LESION IMAGE BX SPEC STEREO GUIDE 02/05/2023 [facility]   BREAST BIOPSY  03/09/2023   MM RT RADIOACTIVE SEED LOC MAMMO GUIDE 03/09/2023 [facility]   BREAST BIOPSY  03/09/2023   MM RT RADIOACTIVE SEED EA ADD LESION LOC MAMMO GUIDE 03/09/2023 [facility]   BREAST EXCISIONAL BIOPSY Bilateral over 10 years ago   benign   BREAST LUMPECTOMY WITH RADIOACTIVE SEED LOCALIZATION Right 03/10/2023   Procedure: RIGHT BREAST LUMPECTOMY WITH RADIOACTIVE SEED LOCALIZATION;  Surgeon: Emelia Loron, MD;  Location: [facility] OR;  Service: General;  Laterality: Right;   CARDIAC CATHETERIZATION N/A 08/13/2016   Procedure: Right Heart Cath;  Surgeon: Laurey Morale, MD;  Location: [facility] INVASIVE CV LAB;  Service: Cardiovascular;  Laterality: N/A;    normal study, no evidence pulm HTN   CARPAL TUNNEL RELEASE  2009   BILATERAL   CATARACT EXTRACTION W/PHACO Right 02/27/2020   Procedure: CATARACT EXTRACTION PHACO AND INTRAOCULAR LENS PLACEMENT (IOC) RIGHT 3.60 00:23.9;  Surgeon: Galen Manila, MD;  Location: [facility];  Service: Ophthalmology;  Laterality: Right;  sleep apnea   CATARACT EXTRACTION W/PHACO Left 05/21/2020   Procedure: CATARACT EXTRACTION PHACO AND INTRAOCULAR LENS PLACEMENT (IOC) LEFT 3.69 00:30.9;  Surgeon: Galen Manila, MD;  Location: [facility];  Service: Ophthalmology;  Laterality: Left;  sleep apnea   CHOLECYSTECTOMY N/A 03/25/2021   Procedure: LAPAROSCOPIC CHOLECYSTECTOMY;  Surgeon: Emelia Loron, MD;  Location: [facility] OR;  Service: General;  Laterality: N/A;   CYSTOCELE REPAIR  12/11/2011   Procedure: ANTERIOR REPAIR  (CYSTOCELE);  Surgeon: Garnett Farm, MD;  Location: [facility];  Service: Urology;  Laterality: N/A;  1 1/2 hour requested for case  Anterior repair and mid Urethral Sling   INCISIONAL HERNIA REPAIR N/A 12/24/2021   Procedure: LAP ASSISTED INCISIONAL HERNIA REPAIR WITH MESH;  Surgeon: Emelia Loron, MD;  Location: [facility] OR;  Service: General;  Laterality: N/A;  GEN & TAP BLOCK RNFA   MASS EXCISION N/A 07/24/2019   Procedure: EXCISION OF VAGINAL FOREIGN BODY;  Surgeon: Ihor Gully, MD;  Location: [facility];  Service: Urology;  Laterality: N/A;   NASAL SEPTOPLASTY W/ TURBINOPLASTY Bilateral 11/18/2016   Procedure: NASAL SEPTOPLASTY WITH BILATERAL TURBINATE REDUCTION;  Surgeon: Osborn Coho, MD;  Location: [facility] OR;  Service: ENT;  Laterality: Bilateral;   NONINVASIVE VASCULAR CAROTID STUDY  09/14/2007   BILATERAL MILD MIX PLAQUE THROUGHOUT, NO SIGNIFICANT BILATERAL ICA STENOSIS   PUBOVAGINAL SLING  12/11/2011   Procedure: Leonides Grills;  Surgeon: Garnett Farm, MD;  Location: [facility];  Service: Urology;  Laterality: N/A;   PULLEY RELEASE RIGHT THUMB  2009  RADIOACTIVE SEED GUIDED EXCISIONAL BREAST BIOPSY Right 03/10/2023   Procedure: RADIOACTIVE SEED GUIDED EXCISIONAL RIGHT BREAST BIOPSY;  Surgeon: Emelia Loron, MD;  Location: [facility] OR;  Service: General;  Laterality: Right;   RE-EXCISION OF BREAST LUMPECTOMY Right 03/30/2023   Procedure: RE-EXCISION OF RIGHT BREAST LUMPECTOMY;  Surgeon: Emelia Loron, MD;  Location: [facility];  Service: General;  Laterality: Right;   SHOULDER ARTHROSCOPY DISTAL CLAVICLE EXCISION AND OPEN ROTATOR CUFF REPAIR Right 01-17-2019   dr supple @[facility]    SINUS ENDO WITH FUSION Bilateral 11/18/2016   Procedure: BILATERAL ENDOSCOPIC SINUS SURGERY;  Surgeon: Osborn Coho, MD;  Location: [facility] OR;  Service: ENT;  Laterality: Bilateral;   UMBILICAL HERNIA REPAIR N/A 03/25/2021   Procedure:  Primary REPAIR UMBILICAL HERNIA;  Surgeon: Emelia Loron, MD;  Location: [facility] OR;  Service: General;  Laterality: N/A;   UPPER GASTROINTESTINAL ENDOSCOPY     VAGINAL HYSTERECTOMY  AGE 38    Social History   Socioeconomic History   Marital status: Married    Spouse name: Not on file   Number of children: 2   Years of education: Not on file   Highest education level: Not on file  Occupational History   Not on file  Tobacco Use   Smoking status: Never   Smokeless tobacco: Never  Vaping Use   Vaping status: Never Used  Substance and Sexual Activity   Alcohol use: No   Drug use: No   Sexual activity: Not on file  Other Topics Concern   Not on file  Social History Narrative   Lives with husband.    Retired Environmental health practitioner   Has 2 children.   Social Determinants of Health   Financial Resource Strain: Not on file  Food Insecurity: No Food Insecurity (03/16/2023)   Hunger Vital Sign    Worried About Running Out of Food in the Last Year: Never true    Ran Out of Food in the Last Year: Never true  Transportation Needs: No Transportation Needs (03/16/2023)   PRAPARE - Administrator, Civil Service (Medical): No    Lack of Transportation (Non-Medical): No  Physical Activity: Not on file  Stress: Not on file  Social Connections: Not on file  Intimate Partner Violence: Not At Risk (03/16/2023)   Humiliation, Afraid, Rape, and Kick questionnaire    Fear of Current or Ex-Partner: No    Emotionally Abused: No    Physically Abused: No    Sexually Abused: No    Family History  Adopted: Yes  Problem Relation Age of Onset   Diabetes Mother    Stroke Mother    Alzheimer's disease Mother    Hypertension Sister    Diabetes Sister    Heart attack Brother    Leukemia Brother    CAD Brother    Diabetes Brother    Colon cancer Maternal Aunt    Stroke Paternal Grandmother    Heart attack Paternal Grandfather    Esophageal cancer Neg Hx    Rectal cancer Neg  Hx    Stomach cancer Neg Hx      Current Outpatient Medications:    acetaminophen (TYLENOL) 650 MG CR tablet, Take 650 mg by mouth every 8 (eight) hours as needed for pain., Disp: , Rfl:    albuterol (VENTOLIN HFA) 108 (90 Base) MCG/ACT inhaler, Inhale 2 puffs into the lungs every 6 (six) hours as needed for wheezing or shortness of breath., Disp: 8 g, Rfl: 2   chlorpheniramine-HYDROcodone (TUSSIONEX) 10-8  MG/5ML, Take 5 mLs by mouth as needed., Disp: , Rfl:    citalopram (CELEXA) 10 MG tablet, Take 5 mg by mouth in the morning and at bedtime., Disp: , Rfl:    clonazePAM (KLONOPIN) 0.5 MG tablet, Take 0.5 mg by mouth at bedtime., Disp: , Rfl:    cyclobenzaprine (FLEXERIL) 10 MG tablet, Take 1 tablet (10 mg total) by mouth 3 (three) times daily as needed for muscle spasms. (Patient taking differently: Take 10 mg by mouth at bedtime as needed for muscle spasms.), Disp: 50 tablet, Rfl: 1   dicyclomine (BENTYL) 10 MG capsule, TAKE 1 CAPSULE (10 MG TOTAL) BY MOUTH 4 (FOUR) TIMES DAILY AS NEEDED FOR SPASMS., Disp: 360 capsule, Rfl: 1   estradiol (ESTRACE) 0.1 MG/GM vaginal cream, Apply a pea-sized amount to fingertip and wipe in vaginal introitus twice weekly (Patient not taking: Reported on 05/06/2023), Disp: 42.5 g, Rfl: 1   fexofenadine (ALLEGRA) 180 MG tablet, Take 180 mg by mouth in the morning., Disp: , Rfl:    fluticasone (FLONASE) 50 MCG/ACT nasal spray, Place 1 spray into both nostrils daily as needed for allergies or rhinitis., Disp: , Rfl:    Fluticasone Furoate (ARNUITY ELLIPTA) 100 MCG/ACT AEPB, Inhale 1 puff into the lungs daily., Disp: 30 each, Rfl: 6   guaiFENesin (MUCINEX) 600 MG 12 hr tablet, Take 600 mg by mouth as needed., Disp: , Rfl:    hydrochlorothiazide (HYDRODIURIL) 25 MG tablet, Take 1 tablet (25 mg total) by mouth daily., Disp:  , Rfl:    losartan (COZAAR) 25 MG tablet, Take 25 mg by mouth in the morning., Disp: , Rfl:    methocarbamol (ROBAXIN) 500 MG tablet, Take 500 mg by  mouth as needed for muscle spasms., Disp: , Rfl:    ondansetron (ZOFRAN) 4 MG tablet, Take 1 tablet (4 mg total) by mouth as needed for nausea or vomiting., Disp: 20 tablet, Rfl: 3   pantoprazole (PROTONIX) 40 MG tablet, TAKE 1 TABLET BY MOUTH TWICE A DAY, Disp: 180 tablet, Rfl: 2   potassium chloride (MICRO-K) 10 MEQ CR capsule, Take 20 mEq by mouth in the morning., Disp: , Rfl:    promethazine (PHENERGAN) 25 MG tablet, Take 1 tablet (25 mg total) by mouth every 6 (six) hours as needed., Disp: 30 tablet, Rfl: 3   sodium chloride (OCEAN) 0.65 % SOLN nasal spray, Place 1 spray into both nostrils 3 (three) times daily as needed for congestion., Disp: , Rfl:    tamoxifen (NOLVADEX) 10 MG tablet, Take 1 tablet (10 mg total) by mouth daily., Disp: 30 tablet, Rfl: 2   traMADol (ULTRAM) 50 MG tablet, Take 1 tablet (50 mg total) by mouth every 6 (six) hours as needed for moderate pain or severe pain., Disp: 10 tablet, Rfl: 0   traZODone (DESYREL) 50 MG tablet, Take 50 mg by mouth at bedtime., Disp: , Rfl:   Physical exam:  Vitals:   08/20/23 1036  BP: (!) 139/55  Pulse: 63  Resp: 18  Temp: 98.6 F (37 C)  TempSrc: Tympanic  SpO2: 98%  Weight: 171 lb 4.8 oz (77.7 kg)   Physical Exam Cardiovascular:     Rate and Rhythm: Normal rate and regular rhythm.     Heart sounds: Normal heart sounds.  Pulmonary:     Effort: Pulmonary effort is normal.  Skin:    General: Skin is warm and dry.  Neurological:     Mental Status: She is alert and oriented to person, place, and time.  Breast exam was performed in seated and lying down position. Patient is status post right lumpectomy with a well-healed surgical scar. No evidence of any palpable masses. No evidence of axillary adenopathy. No evidence of any palpable masses or lumps in the left breast. No evidence of leftt axillary adenopathy      Latest Ref Rng & Units 03/03/2023    2:00 PM  CMP  Glucose 70 - 99 mg/dL 347   BUN 8 - 23 mg/dL 7    Creatinine 4.25 - 1.00 mg/dL 9.56   Sodium 387 - 564 mmol/L 135   Potassium 3.5 - 5.1 mmol/L 3.8   Chloride 98 - 111 mmol/L 95   CO2 22 - 32 mmol/L 29   Calcium 8.9 - 10.3 mg/dL 9.1       Latest Ref Rng & Units 03/03/2023    2:00 PM  CBC  WBC 4.0 - 10.5 K/uL 5.7   Hemoglobin 12.0 - 15.0 g/dL 33.2   Hematocrit 95.1 - 46.0 % 37.5   Platelets 150 - 400 K/uL 231     Assessment and plan- Patient is a 77 y.o. female with history of right breast DCIS ER positive currently on tamoxifen here for routine follow-up  Clinically patient is doing well with no signs and symptoms of recurrence based on today's exam.  She is tolerating low-dose tamoxifen of 10 mg daily without any significant side effects.  I will see her back in 6 months no labs.  She will need a mammogram in February 2025.  LFTs are presently normal   Visit Diagnosis 1. Encounter for follow-up surveillance of ductal carcinoma in situ (DCIS) of breast   2. Encounter for monitoring tamoxifen therapy      Dr. Owens Shark, MD, MPH [facility] [phone] 08/20/2023 10:17 AM

## 2023-08-23 ENCOUNTER — Other Ambulatory Visit: Payer: Self-pay | Admitting: Oncology

## 2023-08-23 DIAGNOSIS — Z08 Encounter for follow-up examination after completed treatment for malignant neoplasm: Secondary | ICD-10-CM

## 2023-08-24 ENCOUNTER — Ambulatory Visit: Payer: HMO | Admitting: Pulmonary Disease

## 2023-08-24 ENCOUNTER — Encounter: Payer: Self-pay | Admitting: Pulmonary Disease

## 2023-08-24 VITALS — BP 110/70 | HR 62 | Temp 98.2°F | Ht 64.0 in | Wt 171.6 lb

## 2023-08-24 DIAGNOSIS — J4540 Moderate persistent asthma, uncomplicated: Secondary | ICD-10-CM

## 2023-08-24 DIAGNOSIS — K219 Gastro-esophageal reflux disease without esophagitis: Secondary | ICD-10-CM | POA: Diagnosis not present

## 2023-08-24 DIAGNOSIS — J329 Chronic sinusitis, unspecified: Secondary | ICD-10-CM

## 2023-08-24 DIAGNOSIS — R053 Chronic cough: Secondary | ICD-10-CM | POA: Diagnosis not present

## 2023-08-24 LAB — NITRIC OXIDE: Nitric Oxide: 22

## 2023-08-24 NOTE — Patient Instructions (Signed)
Your level of inflammation today was low which is good.  Continue your Arnuity Ellipta 1 inhalation daily.  Sure you rinse your mouth well after you use it.  Use your albuterol as needed.  If you notice that you have to use your albuterol more often please let us know as this may be the first indication that you need to have your asthma management stepped up.  You do have asthma which is mild to moderate.  Did not test positive for any allergens that we measured by the blood test done during her last visit.  We will see you in follow-up in 3 months time, call sooner should any new problems arise.

## 2023-08-24 NOTE — Progress Notes (Signed)
Subjective:    Patient ID: Amy Vang, female    DOB: 07-Apr-1946, 77 y.o.   MRN: [id number]  Patient Care Team: Merri Brunette, MD as PCP - General (Family Medicine) Creig Hines, MD as Consulting Physician (Oncology)  Chief Complaint  Patient presents with   Follow-up    NO SOB, wheezing or cough.     HPI Amy Vang is a 77 year old lifelong never smoker who presents for follow-up on the issue of cough, hoarseness and wheezing that occurs 2-3 times per year.  Patient also notes throat clearing pretty much year-round.  I first saw the patient on 13 July 2023.  Recall that at the time the patient stated that she has had issues for a number of years (6 to 8 years).  Of note she had previously been evaluated for this issue by Dr. Marchelle Gearing in 2017.  She last saw Rubye Oaks, NP for this issue on 16 February 2017.  She had had normal PFTs at that time.  She has previously been given a diagnosis of asthma.  She has been evaluated by Dr. Eileen Stanford as well.  Dr. Barnetta Chapel had told her in the past that she had allergies to mold and trees.  Previously she had tried maintenance inhalers but did not feel that these helped her however she was also not consistent using them.  She does note that albuterol helps her symptoms.  She has issues with chronic nasal congestion and sneezing.  She uses Flonase which helps.  He occasionally gets chest tightness and this is relieved by use of albuterol.  She has had issues with gastroesophageal reflux and heartburn but currently these are well-controlled.  She is currently on Protonix which helps this issue.  Since her initial visit with me she has not had any fevers, chills or sweats.  At her initial visit with me she was started on Arnuity Ellipta 100 mcg, 1 inhalation daily and pulmonary function testing were performed that shows that she has reversible mild to moderate obstructive airways disease.  This is consistent with asthma.  Notes that since  starting Arnuity, within 2 days she noticed no shortness of breath, no wheezing or cough.  She continues to do some throat clearing but this is also improving.  Her symptoms are markedly better.  She is now requiring albuterol only rarely less than once a week.  She did have an allergen panel checked and this was negative.  IgE total was 94 IU/mL (normal).  She does not endorse any other symptomatology.  She is to have her flu vaccine in October which is her preference.   DATA 07/13/2023 allergen panel/IgE: No specific allergen IgE detected.  IgE total 94 IU/mL (normal) 08/10/2023 PFTs: FEV1 1.55 L or 73% predicted FVC 2.61 L or 92% predicted, FEV1/FVC 60%, lung volumes normal diffusion capacity mildly reduced, corrects for alveolar volume.  There is significant bronchodilator response.  Consistent with mild to moderate obstructive airways disease, reversible.  Review of Systems A 10 point review of systems was performed and it is as noted above otherwise negative.   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   Abnormal vaginal bleeding 12/17/2021   Allergic rhinitis 12/17/2021   Adjustment disorder 12/17/2021   Cholelithiasis without obstruction 12/17/2021   Constipation 12/17/2021   Degeneration of lumbar intervertebral disc 12/17/2021   Diverticulosis of sigmoid colon 12/17/2021   Fatty liver 12/17/2021   Functional  abdominal pain syndrome 12/17/2021   Gastro-esophageal reflux disease without esophagitis 12/17/2021   Hypo-osmolality and hyponatremia 12/17/2021   Internal hemorrhoids 12/17/2021   Irritable bowel syndrome without diarrhea 12/17/2021   Lymphocytosis 12/17/2021   Nausea and vomiting 12/17/2021   Overactive bladder 12/17/2021   Personal history of colonic polyps 12/17/2021   Personal history of transient ischemic attack (TIA), and cerebral infarction without residual deficits 12/17/2021   Insomnia  12/17/2021   Pure hypercholesterolemia 12/17/2021   Restless legs syndrome 12/17/2021   Rosacea 12/17/2021   Sleep-wake schedule disorder, 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   Piriformis syndrome of right side 01/01/2021   Lower limb pain, inferior, right 09/19/2020   Lumbosacral radiculitis 09/19/2020   Body mass index (BMI) 25.0-25.9, adult 06/17/2020   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   Annular tear of lumbar disc 01/24/2020   Acute back pain with sciatica 12/28/2019   Encounter for orthopedic follow-up care 01/17/2019   Partial thickness rotator cuff tear 11/07/2018   Acquired trigger finger 10/28/2018   Pain in joint of right shoulder 05/06/2018   Hoarseness 12/22/2017   Cervical spondylosis with radiculopathy 10/25/2017   Neck pain 08/31/2017   Other cervical disc displacement, unspecified cervical region 08/31/2017   Sinusitis, chronic 11/18/2016   Deviated septum 09/18/2016   Dyspnea and respiratory abnormality 05/21/2016   Wheeze 05/21/2016   Chronic throat clearing 05/21/2016   Pulmonary HTN (HCC) 04/23/2016   Moderate persistent asthma 11/12/2015   Allergic rhinoconjunctivitis 11/12/2015   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   SOB (shortness of breath) 03/06/2015   OSA (obstructive sleep apnea) 01/01/2014   Benign essential HTN 01/01/2014   Obesity 01/01/2014    Social History   Tobacco Use   Smoking status: Never   Smokeless tobacco: Never  Substance Use Topics   Alcohol use: No    Allergies  Allergen Reactions   Nsaids      Abdominal Pain   Ambien [Zolpidem] Other (See Comments)    groggy   Hydroxyzine Hcl     UNSPECIFIED REACTION    Lactose Intolerance (Gi) Other (See Comments)    bloating   Septra [Sulfamethoxazole-Trimethoprim] Rash   Current Meds  Medication Sig   acetaminophen (TYLENOL) 650 MG CR tablet Take 650 mg by mouth every 8 (eight) hours as needed for pain.   albuterol (VENTOLIN HFA) 108 (90 Base) MCG/ACT inhaler Inhale 2 puffs into the lungs  every 6 (six) hours as needed for wheezing or shortness of breath.   citalopram (CELEXA) 10 MG tablet Take 5 mg by mouth in the morning and at bedtime.   clonazePAM (KLONOPIN) 0.5 MG tablet Take 0.5 mg by mouth at bedtime.   cyclobenzaprine (FLEXERIL) 10 MG tablet Take 1 tablet (10 mg total) by mouth 3 (three) times daily as needed for muscle spasms. (Patient taking differently: Take 10 mg by mouth at bedtime as needed for muscle spasms.)   dicyclomine (BENTYL) 10 MG capsule TAKE 1 CAPSULE (10 MG TOTAL) BY MOUTH 4 (FOUR) TIMES DAILY AS NEEDED FOR SPASMS.   fexofenadine (ALLEGRA) 180 MG tablet Take 180 mg by mouth in the morning.   fluticasone (FLONASE) 50 MCG/ACT nasal spray Place 1 spray into both nostrils daily as needed for allergies or rhinitis.   Fluticasone Furoate (ARNUITY ELLIPTA) 100 MCG/ACT AEPB Inhale 1 puff into the lungs daily.   losartan (COZAAR) 25 MG tablet Take 25 mg by mouth in the morning.   methocarbamol (ROBAXIN) 500 MG tablet Take 500 mg by mouth as needed for muscle spasms.  ondansetron (ZOFRAN) 4 MG tablet Take 1 tablet (4 mg total) by mouth as needed for nausea or vomiting.   pantoprazole (PROTONIX) 40 MG tablet TAKE 1 TABLET BY MOUTH TWICE A DAY   potassium chloride (MICRO-K) 10 MEQ CR capsule Take 20 mEq by mouth in the morning.   promethazine (PHENERGAN) 25 MG tablet Take 1 tablet (25 mg total) by mouth every 6 (six) hours as needed.   sodium chloride (OCEAN) 0.65 % SOLN nasal spray Place 1 spray into both nostrils 3 (three) times daily as needed for congestion.   tamoxifen (NOLVADEX) 10 MG tablet Take 1 tablet (10 mg total) by mouth daily.   traMADol (ULTRAM) 50 MG tablet Take 1 tablet (50 mg total) by mouth every 6 (six) hours as needed for moderate pain or severe pain.   traZODone (DESYREL) 50 MG tablet Take 50 mg by mouth at bedtime.   Immunization History  Administered Date(s) Administered   Fluad Quad(high Dose 65+) 08/30/2022   Influenza Split 09/03/2009,  09/17/2010, 09/03/2012, 08/22/2015, 08/11/2019, 01/29/2021   Influenza, High Dose Seasonal PF 09/05/2015, 09/01/2016, 09/06/2017, 10/11/2018, 08/18/2019   Influenza,inj,Quad PF,6+ Mos 08/30/2016   Influenza-Unspecified 08/26/2017   PFIZER(Purple Top)SARS-COV-2 Vaccination 12/23/2019, 01/13/2020, 08/27/2020, 05/06/2021   Pneumococcal Conjugate-13 12/13/2013, 09/01/2016   Pneumococcal Polysaccharide-23 09/28/2011, 09/01/2016   Tdap 03/07/2007, 09/08/2018   Zoster Recombinant(Shingrix) 11/09/2017   Zoster, Live 03/28/2008, 08/20/2017, 11/09/2017        Objective:     BP 110/70 (BP Location: Right Arm, Cuff Size: Normal)   Pulse 62   Temp 98.2 F (36.8 C)   Ht 5\' 4"  (1.626 m)   Wt 171 lb 9.6 oz (77.8 kg)   SpO2 96%   BMI 29.46 kg/m   SpO2: 96 % O2 Device: None (Room air)  GENERAL: Well-developed, somewhat overweight woman, no acute distress, fully ambulatory.  No conversational dyspnea. HEAD: Normocephalic, atraumatic.  EYES: Pupils equal, round, reactive to light.  No scleral icterus.  MOUTH: Teeth intact, oral mucosa moist.  No thrush. NECK: Supple. No thyromegaly. Trachea midline. No JVD.  No adenopathy. PULMONARY: Good air entry bilaterally.  No adventitious sounds. CARDIOVASCULAR: S1 and S2. Regular rate and rhythm.  No rubs, murmurs or gallops heard. ABDOMEN: Benign. MUSCULOSKELETAL: No joint deformity, no clubbing, no edema.  NEUROLOGIC: No overt focal deficit, no gait disturbance, speech is fluent. SKIN: Intact,warm,dry. PSYCH: Mood and behavior normal  Lab Results  Component Value Date   NITRICOXIDE 22 08/24/2023     Assessment & Plan:     ICD-10-CM   1. Moderate persistent asthma without complication  J45.40 Nitric oxide   Well compensated Continue Arnuity Ellipta 100, 1 puff daily Continue as needed albuterol Asthma action plan in place    2. Chronic cough  R05.3 Nitric oxide   Resolved on Arnuity Likely cough variant asthma    3. Chronic  rhinosinusitis  J32.9    Nasal hygiene As needed Allegra    4. Laryngopharyngeal reflux (LPR)  K21.9    Continue antireflux measures Continue Protonix (PPI)     Orders Placed This Encounter  Procedures   Nitric oxide   The patient was advised on symptoms to monitor for that may indicate worsening of asthma.  She will see [place] in follow-up in 3 months time she is to contact us prior to that time should any new difficulties arise.   Gailen Shelter, MD Advanced Bronchoscopy [facility]    *This note was dictated using voice recognition software/Dragon.  Despite  best efforts to proofread, errors can occur which can change the meaning. Any transcriptional errors that result from this process are unintentional and may not be fully corrected at the time of dictation.

## 2023-09-21 ENCOUNTER — Other Ambulatory Visit: Payer: Self-pay | Admitting: Internal Medicine

## 2023-09-22 ENCOUNTER — Other Ambulatory Visit: Payer: Self-pay | Admitting: Internal Medicine

## 2023-10-01 DIAGNOSIS — G4733 Obstructive sleep apnea (adult) (pediatric): Secondary | ICD-10-CM | POA: Diagnosis not present

## 2023-10-06 DIAGNOSIS — M25551 Pain in right hip: Secondary | ICD-10-CM | POA: Diagnosis not present

## 2023-10-20 ENCOUNTER — Other Ambulatory Visit: Payer: Self-pay | Admitting: Internal Medicine

## 2023-10-20 DIAGNOSIS — K219 Gastro-esophageal reflux disease without esophagitis: Secondary | ICD-10-CM

## 2023-10-31 NOTE — Progress Notes (Signed)
Cardiology Office Note  Date:  11/01/2023   ID:  Amy Vang, DOB 1945/12/19, MRN [id number]  PCP:  Merri Brunette, MD   Chief Complaint  Patient presents with   Follow-up    Patient c/o pounding in head as if a feeling of "heart beating" in her head. Medications reviewed by the patient verbally.     HPI:  Amy Vang is a 77 y.o. female with a history of  OSA, on CPAP HTN  GERD pulm HTN, by previous echocardiography previously seen [facility] August 2017 Normal right heart pressure by [facility] 07/2016  mild  diffuse aortic athero on CT Breast cancer, surgery x 2, no xrt Presents for follow-up of her SOB, hypertension, aortic atherosclerosis on CT scan  Last seen in clinic by myself February 2022 In follow-up today reports that she has appreciated a throbbing on left side of head Denies pain in her left temporal area  Has appreciated elevated blood pressure at home, BP 154/80 on recent measurement Previously on hydrochlorothiazide,  Stopped hydrochlorothiazide and potassium on her own  Followed by pulmonary, has asthma, reports symptoms are fine on her inhalers  No regular exercise program but stays active No chest pain concerning for angina Denies shortness of breath, no leg swelling no PND orthopnea  Prior CT chest May 2017 images reviewed  mild  diffuse aortic athero  Lab work reviewed  Total cholesterol 124, LDL 57, not on cholesterol medication  Prior abnormal EKG noted previously EKG personally reviewed by myself on todays visit EKG Interpretation Date/Time:  Monday November 01 2023 12:19:27 EST Ventricular Rate:  64 PR Interval:  168 QRS Duration:  84 QT Interval:  416 QTC Calculation: 429 R Axis:   -18  Text Interpretation: Normal sinus rhythm Poor R wave progression Nonspecific T wave abnormality When compared with ECG of 03-Mar-2023 13:48, No significant change was found Confirmed by Julien Nordmann [phone]) on 11/01/2023 12:28:35 PM   severe  GERD history, symptoms stable on PPI twice daily  Previous cardiac work-up reviewed with her  2D echo showed normal LVF with diastolic dysfunction, moderate TR and moderate pulmonary HTN.    chest CT angio which was negative for pulmonary emboli and PFTs showed reduced DLCO at 64% predicted.    CPAP.  She tolerates the mask and feels the pressure is adequate.   C6-7 herniated disc, spondylosis, cervicalgia, cervical radiculopathy  extensive workup for her dyspnea.   Echo showed elevated PA pressure at 46 mmHg with normal LV and RV systolic function.    CTA chest showed no PE or interstitial lung disease.   PFTs were normal except for decreased DLCO.   Cardiolite showed no ischemia or infarction.   CPX showed near-normal functional capacity, primarily limited by body habitus.  Echocardiogram May 2020  1. The left ventricle has normal systolic function with an ejection fraction of 60-65%. The cavity size was normal. There is mildly increased left ventricular wall thickness. Left ventricular diastolic Doppler parameters are consistent with impaired  relaxation.  2. The right ventricle has normal systolic function. The cavity was normal. There is no increase in right ventricular wall thickness.Unable to estimate RVSP  3. Aortic valve regurgitation is mild by color flow Doppler.  4. Mild mitral valve regurgitation  PMH:   has a past medical history of Allergic rhinitis, Allergy, Anemia, Anxiety, Cancer (HCC), Cataract, Chronic constipation, COVID-19 (08/2019), Degenerative disc disease, cervical, Essential hypertension, GERD (gastroesophageal reflux disease) (1957), H/O bronchitis, History of echocardiogram, History of TIAs, Motion  sickness, Nocturia, OSA on CPAP, PONV (postoperative nausea and vomiting), Restless leg syndrome, Rosacea, Seasonal asthma, Sleep apnea, and Stroke (HCC) (1985).  PSH:    Past Surgical History:  Procedure Laterality Date   ANTERIOR CERVICAL DECOMP/DISCECTOMY  FUSION N/A 10/25/2017   Procedure: CERVICAL SIX- CERVICAL SEVEN ANTERIOR CERVICAL DECOMPRESSION/DISCECTOMY FUSION, INTERBODY PROSTHESIS AND ANTERIOR PLATING;  Surgeon: Tressie Stalker, MD;  Location: [facility] OR;  Service: Neurosurgery;  Laterality: N/A;  CERVICAL 6- CERVICAL 7 ANTERIOR CERVICAL DECOMPRESSION/DISCECTOMY FUSION, INTERBODY PROSTHESIS AND ANTERIOR PLATING   BACK SURGERY  04/2022   BILATERAL BENIGN BREAST BX'S  20 YRS AGO   BREAST BIOPSY Right    benign   BREAST BIOPSY Right 01/27/2023   MM RT BREAST BX W LOC DEV 1ST LESION IMAGE BX SPEC STEREO GUIDE 01/27/2023 [facility]   BREAST BIOPSY Right 02/05/2023   MM RT BREAST BX W LOC DEV 1ST LESION IMAGE BX SPEC STEREO GUIDE 02/05/2023 [facility]   BREAST BIOPSY  03/09/2023   MM RT RADIOACTIVE SEED LOC MAMMO GUIDE 03/09/2023 [facility]   BREAST BIOPSY  03/09/2023   MM RT RADIOACTIVE SEED EA ADD LESION LOC MAMMO GUIDE 03/09/2023 [facility]   BREAST EXCISIONAL BIOPSY Bilateral over 10 years ago   benign   BREAST LUMPECTOMY WITH RADIOACTIVE SEED LOCALIZATION Right 03/10/2023   Procedure: RIGHT BREAST LUMPECTOMY WITH RADIOACTIVE SEED LOCALIZATION;  Surgeon: Emelia Loron, MD;  Location: [facility] OR;  Service: General;  Laterality: Right;   CARDIAC CATHETERIZATION N/A 08/13/2016   Procedure: Right Heart Cath;  Surgeon: Laurey Morale, MD;  Location: [facility] INVASIVE CV LAB;  Service: Cardiovascular;  Laterality: N/A;    normal study, no evidence pulm HTN   CARPAL TUNNEL RELEASE  2009   BILATERAL   CATARACT EXTRACTION W/PHACO Right 02/27/2020   Procedure: CATARACT EXTRACTION PHACO AND INTRAOCULAR LENS PLACEMENT (IOC) RIGHT 3.60 00:23.9;  Surgeon: Galen Manila, MD;  Location: [facility];  Service: Ophthalmology;  Laterality: Right;  sleep apnea   CATARACT EXTRACTION W/PHACO Left 05/21/2020   Procedure: CATARACT EXTRACTION PHACO AND INTRAOCULAR LENS PLACEMENT (IOC) LEFT 3.69 00:30.9;  Surgeon: Galen Manila, MD;   Location: [facility];  Service: Ophthalmology;  Laterality: Left;  sleep apnea   CHOLECYSTECTOMY N/A 03/25/2021   Procedure: LAPAROSCOPIC CHOLECYSTECTOMY;  Surgeon: Emelia Loron, MD;  Location: [facility] OR;  Service: General;  Laterality: N/A;   CYSTOCELE REPAIR  12/11/2011   Procedure: ANTERIOR REPAIR (CYSTOCELE);  Surgeon: Garnett Farm, MD;  Location: [facility];  Service: Urology;  Laterality: N/A;  1 1/2 hour requested for case  Anterior repair and mid Urethral Sling   INCISIONAL HERNIA REPAIR N/A 12/24/2021   Procedure: LAP ASSISTED INCISIONAL HERNIA REPAIR WITH MESH;  Surgeon: Emelia Loron, MD;  Location: [facility] OR;  Service: General;  Laterality: N/A;  GEN & TAP BLOCK RNFA   MASS EXCISION N/A 07/24/2019   Procedure: EXCISION OF VAGINAL FOREIGN BODY;  Surgeon: Ihor Gully, MD;  Location: [facility];  Service: Urology;  Laterality: N/A;   NASAL SEPTOPLASTY W/ TURBINOPLASTY Bilateral 11/18/2016   Procedure: NASAL SEPTOPLASTY WITH BILATERAL TURBINATE REDUCTION;  Surgeon: Osborn Coho, MD;  Location: [facility] OR;  Service: ENT;  Laterality: Bilateral;   NONINVASIVE VASCULAR CAROTID STUDY  09/14/2007   BILATERAL MILD MIX PLAQUE THROUGHOUT, NO SIGNIFICANT BILATERAL ICA STENOSIS   PUBOVAGINAL SLING  12/11/2011   Procedure: Leonides Grills;  Surgeon: Garnett Farm, MD;  Location[facility];  Service: Urology;  Laterality: N/A;  PULLEY RELEASE RIGHT THUMB  2009   RADIOACTIVE SEED GUIDED EXCISIONAL BREAST BIOPSY Right 03/10/2023   Procedure: RADIOACTIVE SEED GUIDED EXCISIONAL RIGHT BREAST BIOPSY;  Surgeon: Emelia Loron, MD;  Location: [facility] OR;  Service: General;  Laterality: Right;   RE-EXCISION OF BREAST LUMPECTOMY Right 03/30/2023   Procedure: RE-EXCISION OF RIGHT BREAST LUMPECTOMY;  Surgeon: Emelia Loron, MD;  Location: [facility];  Service: General;  Laterality: Right;   SHOULDER ARTHROSCOPY DISTAL  CLAVICLE EXCISION AND OPEN ROTATOR CUFF REPAIR Right 01-17-2019   dr supple @[facility]    SINUS ENDO WITH FUSION Bilateral 11/18/2016   Procedure: BILATERAL ENDOSCOPIC SINUS SURGERY;  Surgeon: Osborn Coho, MD;  Location: [facility] OR;  Service: ENT;  Laterality: Bilateral;   UMBILICAL HERNIA REPAIR N/A 03/25/2021   Procedure: Primary REPAIR UMBILICAL HERNIA;  Surgeon: Emelia Loron, MD;  Location: [facility] OR;  Service: General;  Laterality: N/A;   UPPER GASTROINTESTINAL ENDOSCOPY     VAGINAL HYSTERECTOMY  AGE 52    Current Outpatient Medications  Medication Sig Dispense Refill   acetaminophen (TYLENOL) 650 MG CR tablet Take 650 mg by mouth every 8 (eight) hours as needed for pain.     albuterol (VENTOLIN HFA) 108 (90 Base) MCG/ACT inhaler Inhale 2 puffs into the lungs every 6 (six) hours as needed for wheezing or shortness of breath. 8 g 2   citalopram (CELEXA) 10 MG tablet Take 5 mg by mouth in the morning and at bedtime.     clonazePAM (KLONOPIN) 0.5 MG tablet Take 0.5 mg by mouth at bedtime.     cyclobenzaprine (FLEXERIL) 10 MG tablet Take 1 tablet (10 mg total) by mouth 3 (three) times daily as needed for muscle spasms. (Patient taking differently: Take 10 mg by mouth at bedtime as needed for muscle spasms.) 50 tablet 1   dicyclomine (BENTYL) 10 MG capsule TAKE 1 CAPSULE (10 MG TOTAL) BY MOUTH 4 (FOUR) TIMES DAILY AS NEEDED FOR SPASMS. 360 capsule 1   estradiol (ESTRACE) 0.1 MG/GM vaginal cream Apply a pea-sized amount to fingertip and wipe in vaginal introitus twice weekly 42.5 g 1   fexofenadine (ALLEGRA) 180 MG tablet Take 180 mg by mouth in the morning.     fluticasone (FLONASE) 50 MCG/ACT nasal spray Place 1 spray into both nostrils daily as needed for allergies or rhinitis.     Fluticasone Furoate (ARNUITY ELLIPTA) 100 MCG/ACT AEPB Inhale 1 puff into the lungs daily. 30 each 6   HYDROcodone-acetaminophen (NORCO/VICODIN) 5-325 MG tablet Take 1 tablet by mouth every 4 (four) hours as needed.      losartan (COZAAR) 25 MG tablet Take 25 mg by mouth in the morning.     methocarbamol (ROBAXIN) 500 MG tablet Take 500 mg by mouth as needed for muscle spasms.     ondansetron (ZOFRAN) 4 MG tablet TAKE 1 TABLET (4 MG TOTAL) BY MOUTH AS NEEDED FOR NAUSEA OR VOMITING. 18 tablet 4   promethazine (PHENERGAN) 25 MG tablet TAKE 1 TABLET BY MOUTH EVERY 6 HOURS AS NEEDED. 30 tablet 3   sodium chloride (OCEAN) 0.65 % SOLN nasal spray Place 1 spray into both nostrils 3 (three) times daily as needed for congestion.     tamoxifen (NOLVADEX) 10 MG tablet Take 1 tablet (10 mg total) by mouth daily. 30 tablet 2   traMADol (ULTRAM) 50 MG tablet Take 1 tablet (50 mg total) by mouth every 6 (six) hours as needed for moderate pain or severe pain. 10 tablet 0   traZODone (DESYREL) 50  MG tablet Take 50 mg by mouth at bedtime.     pantoprazole (PROTONIX) 40 MG tablet TAKE 1 TABLET BY MOUTH TWICE A DAY (Patient not taking: Reported on 11/01/2023) 180 tablet 2   No current facility-administered medications for this visit.     Allergies:   Nsaids, Ambien [zolpidem], Cefuroxime, Hydroxyzine hcl, Penicillin g sodium, Sulfamethoxazole-trimethoprim, Lactose intolerance (gi), and Septra [sulfamethoxazole-trimethoprim]   Social History:  The patient  reports that she has never smoked. She has never used smokeless tobacco. She reports that she does not drink alcohol and does not use drugs.   Family History:   family history includes Alzheimer's disease in her mother; CAD in her brother; Colon cancer in her maternal aunt; Diabetes in her brother, mother, and sister; Heart attack in her brother and paternal grandfather; Hypertension in her sister; Leukemia in her brother; Stroke in her mother and paternal grandmother. She was adopted.    Review of Systems: Review of Systems  Constitutional: Negative.   HENT: Negative.    Respiratory: Negative.    Cardiovascular: Negative.   Gastrointestinal: Negative.   Musculoskeletal:  Negative.   Neurological: Negative.   Psychiatric/Behavioral: Negative.    All other systems reviewed and are negative.   PHYSICAL EXAM: VS:  BP 130/70 (BP Location: Left Arm, Patient Position: Sitting, Cuff Size: Normal)   Pulse 64   Ht 5\' 4"  (1.626 m)   Wt 171 lb 6 oz (77.7 kg)   SpO2 96%   BMI 29.42 kg/m  , BMI Body mass index is 29.42 kg/m. Constitutional:  oriented to person, place, and time. No distress.  HENT:  Head: Grossly normal Eyes:  no discharge. No scleral icterus.  Neck: No JVD, no carotid bruits  Cardiovascular: Regular rate and rhythm, no murmurs appreciated Pulmonary/Chest: Clear to auscultation bilaterally, no wheezes or rails Abdominal: Soft.  no distension.  no tenderness.  Musculoskeletal: Normal range of motion Neurological:  normal muscle tone. Coordination normal. No atrophy Skin: Skin warm and dry Psychiatric: normal affect, pleasant  Recent Labs: 03/03/2023: Hemoglobin 12.3; Platelets 231 08/20/2023: ALT 15; BUN 15; Creatinine, Ser 0.76; Potassium 3.7; Sodium 135    Lipid Panel Lab Results  Component Value Date   CHOL 142 04/12/2012   HDL 29 (L) 04/12/2012   LDLCALC 75 04/12/2012   TRIG 192 (H) 04/12/2012      Wt Readings from Last 3 Encounters:  11/01/23 171 lb 6 oz (77.7 kg)  08/24/23 171 lb 9.6 oz (77.8 kg)  08/20/23 171 lb 4.8 oz (77.7 kg)     ASSESSMENT AND PLAN:  Problem List Items Addressed This Visit       Cardiology Problems   Benign essential HTN   Relevant Orders   EKG 12-Lead (Completed)     Other   OSA (obstructive sleep apnea)   Other Visit Diagnoses     Aortic atherosclerosis (HCC)    -  Primary   Relevant Orders   EKG 12-Lead (Completed)   Shortness of breath       Relevant Orders   EKG 12-Lead (Completed)       Essential hypertension Blood pressure running high at home 150 systolic Recommend she increase losartan up to 50 mg daily Recommend close monitoring of blood pressure at home, scheduled to  meet with primary care next week  Aortic atherosclerosis Mild disease on CT scan Low-cholesterol on no medication by history  Shortness of breath Recommended exercise program, calorie restriction  GERD On PPI  Abnormal EKG Noted previously  Discussed with her, going back several years Prior stress testing Denies anginal symptoms    Orders Placed This Encounter  Procedures   EKG 12-Lead     Signed, Dossie Arbour, M.D., Ph.D. 11/01/2023  [facility] [place], [place] [phone]

## 2023-11-01 ENCOUNTER — Encounter: Payer: Self-pay | Admitting: Cardiovascular Disease

## 2023-11-01 ENCOUNTER — Encounter: Payer: Self-pay | Admitting: Internal Medicine

## 2023-11-01 ENCOUNTER — Ambulatory Visit: Payer: HMO | Attending: Cardiovascular Disease | Admitting: Cardiovascular Disease

## 2023-11-01 VITALS — BP 130/70 | HR 64 | Ht 64.0 in | Wt 171.4 lb

## 2023-11-01 DIAGNOSIS — I700 Atherosclerosis of aorta: Secondary | ICD-10-CM

## 2023-11-01 DIAGNOSIS — R0602 Shortness of breath: Secondary | ICD-10-CM

## 2023-11-01 DIAGNOSIS — G4733 Obstructive sleep apnea (adult) (pediatric): Secondary | ICD-10-CM

## 2023-11-01 DIAGNOSIS — I10 Essential (primary) hypertension: Secondary | ICD-10-CM | POA: Diagnosis not present

## 2023-11-01 HISTORY — DX: Atherosclerosis of aorta: I70.0

## 2023-11-01 MED ORDER — LOSARTAN POTASSIUM 50 MG PO TABS: 50.0000 mg | ORAL_TABLET | Freq: Every morning | ORAL | 3 refills | Status: DC

## 2023-11-01 NOTE — Patient Instructions (Addendum)
Medication Instructions:  Please increase the losartan up to 50 mg daily  If you need a refill on your cardiac medications before your next appointment, please call your pharmacy.   Lab work: No new labs needed  Testing/Procedures: No new testing needed  Follow-Up: At [facility], you and your health needs are our priority.  As part of our continuing mission to provide you with exceptional heart care, we have created designated Provider Care Teams.  These Care Teams include your primary Cardiologist (physician) and Advanced Practice Providers (APPs -  Physician Assistants and Nurse Practitioners) who all work together to provide you with the care you need, when you need it.  You will need a follow up appointment in 12 months  Providers on your designated Care Team:   Nicolasa Ducking, NP Eula Listen, PA-C Cadence Fransico [place], [place]  Dr. Mariah Milling recommends checking and keeping a log of your blood pressure daily.   It is best to check your blood pressure 1-2 hours after taking your medications.  Below are some tips that our clinical pharmacists share for home BP monitoring:          Rest 10 minutes before taking your blood pressure.          Don't smoke or drink caffeinated beverages for at least 30 minutes before.          Take your blood pressure before (not after) you eat.          Sit comfortably with your back supported and both feet on the floor (don't cross your legs).          Elevate your arm to heart level on a table or a desk.          Use the proper sized cuff. It should fit smoothly and snugly around your bare upper arm. There should be enough room to slip a fingertip under the cuff. The bottom edge of the cuff should be 1 inch above the crease of the elbow.

## 2023-11-03 DIAGNOSIS — I272 Pulmonary hypertension, unspecified: Secondary | ICD-10-CM | POA: Diagnosis not present

## 2023-11-03 DIAGNOSIS — J4540 Moderate persistent asthma, uncomplicated: Secondary | ICD-10-CM | POA: Diagnosis not present

## 2023-11-03 DIAGNOSIS — Z23 Encounter for immunization: Secondary | ICD-10-CM | POA: Diagnosis not present

## 2023-11-03 DIAGNOSIS — G4733 Obstructive sleep apnea (adult) (pediatric): Secondary | ICD-10-CM | POA: Diagnosis not present

## 2023-11-03 DIAGNOSIS — F419 Anxiety disorder, unspecified: Secondary | ICD-10-CM | POA: Diagnosis not present

## 2023-11-03 DIAGNOSIS — R399 Unspecified symptoms and signs involving the genitourinary system: Secondary | ICD-10-CM | POA: Diagnosis not present

## 2023-11-03 DIAGNOSIS — Z0000 Encounter for general adult medical examination without abnormal findings: Secondary | ICD-10-CM | POA: Diagnosis not present

## 2023-11-03 DIAGNOSIS — Z1331 Encounter for screening for depression: Secondary | ICD-10-CM | POA: Diagnosis not present

## 2023-11-03 DIAGNOSIS — D0511 Intraductal carcinoma in situ of right breast: Secondary | ICD-10-CM | POA: Diagnosis not present

## 2023-11-03 DIAGNOSIS — E7800 Pure hypercholesterolemia, unspecified: Secondary | ICD-10-CM | POA: Diagnosis not present

## 2023-11-03 DIAGNOSIS — K219 Gastro-esophageal reflux disease without esophagitis: Secondary | ICD-10-CM | POA: Diagnosis not present

## 2023-11-03 DIAGNOSIS — F5101 Primary insomnia: Secondary | ICD-10-CM | POA: Diagnosis not present

## 2023-11-05 ENCOUNTER — Ambulatory Visit: Payer: Medicare Other | Admitting: Urology

## 2023-11-10 ENCOUNTER — Ambulatory Visit (INDEPENDENT_AMBULATORY_CARE_PROVIDER_SITE_OTHER): Payer: HMO | Admitting: Podiatry

## 2023-11-10 ENCOUNTER — Encounter: Payer: Self-pay | Admitting: Podiatry

## 2023-11-10 DIAGNOSIS — D2372 Other benign neoplasm of skin of left lower limb, including hip: Secondary | ICD-10-CM | POA: Diagnosis not present

## 2023-11-10 DIAGNOSIS — M7752 Other enthesopathy of left foot: Secondary | ICD-10-CM | POA: Diagnosis not present

## 2023-11-10 MED ORDER — DEXAMETHASONE SODIUM PHOSPHATE 120 MG/30ML IJ SOLN
2.0000 mg | Freq: Once | INTRAMUSCULAR | Status: AC
  Administered 2023-11-10: 2 mg via INTRA_ARTICULAR

## 2023-11-10 NOTE — Progress Notes (Signed)
She presents today after not been seen in almost a year with a chief concern of pain beneath the fifth metatarsal head of the left foot.  She states it feels like I am stepping on a rock and that there is something rolling underneath that bone.  Objective: Vital signs are stable she is alert oriented x 3 there is no erythema edema/drainage odor she does have a palpable movable mass beneath fifth metatarsal head of the left foot with an overlying benign skin lesion.  Assessment: Benign skin lesion with probable bursitis subfifth metatarsal head of the left foot.  Plan: I injected the area today with 2 mg of dexamethasone and local anesthetic debrided the benign skin lesion placed Salinocaine under occlusion to be left in 3 days and then washed off thoroughly I will follow-up with her on an as-needed basis.

## 2023-11-17 ENCOUNTER — Other Ambulatory Visit: Payer: Self-pay | Admitting: Internal Medicine

## 2023-11-17 DIAGNOSIS — K219 Gastro-esophageal reflux disease without esophagitis: Secondary | ICD-10-CM

## 2023-11-19 ENCOUNTER — Telehealth: Payer: Self-pay | Admitting: Pulmonary Disease

## 2023-11-19 ENCOUNTER — Encounter: Payer: Self-pay | Admitting: Pulmonary Disease

## 2023-11-19 MED ORDER — METHYLPREDNISOLONE 4 MG PO TBPK: ORAL_TABLET | ORAL | 0 refills | Status: DC

## 2023-11-19 MED ORDER — AZITHROMYCIN 250 MG PO TABS: ORAL_TABLET | ORAL | 0 refills | Status: AC

## 2023-11-19 NOTE — Telephone Encounter (Signed)
See telephone encounter from 12/20. Medications have been sent in and the patient is aware.  Nothing further needed.

## 2023-11-19 NOTE — Telephone Encounter (Signed)
Per patient advise request from the patient- Covid test is Negative. Oxygen is 96/73. Also, with the wheezing in my chest and of course the coughing. do you think I am contagious?

## 2023-11-19 NOTE — Telephone Encounter (Signed)
I have notified the patient. Nothing further needed. 

## 2023-11-19 NOTE — Telephone Encounter (Signed)
I sent the prescription of Medrol and azithromycin to her pharmacy.  Likely this is a bronchitis and the chances of being contagious are low but do exercise handwashing and cover cough.

## 2023-11-19 NOTE — Telephone Encounter (Signed)
How long have your symptoms been going on for? 2-3 weeks Any fevers, chills or sweats? No Any cough? If so are you getting anything up? What color? Cough with sputum, but does not know what color Any SOB? No increased SOB Any wheezing? Wheezing Do you monitor your oxygen at home? Has not checked her O2.  Taking- Albuterol - 2-3 times a day Arnuity- daily Mucinex- once a day Delsym or Robitussin for cough Flonase- 3-4 times day  [facility] [place] [facility]  Patient will test for Covid and send a MyChart message with the results.

## 2023-11-19 NOTE — Telephone Encounter (Signed)
Patient has been wheezing and coughing for two weeks and would like for something to be called in. Please call and advise [phone]  [facility] [place]

## 2023-12-03 ENCOUNTER — Encounter: Payer: Self-pay | Admitting: Pulmonary Disease

## 2023-12-03 ENCOUNTER — Ambulatory Visit: Payer: HMO | Admitting: Pulmonary Disease

## 2023-12-03 VITALS — BP 118/82 | HR 61 | Ht 64.0 in | Wt 172.6 lb

## 2023-12-03 DIAGNOSIS — R053 Chronic cough: Secondary | ICD-10-CM | POA: Diagnosis not present

## 2023-12-03 DIAGNOSIS — J329 Chronic sinusitis, unspecified: Secondary | ICD-10-CM

## 2023-12-03 DIAGNOSIS — J4540 Moderate persistent asthma, uncomplicated: Secondary | ICD-10-CM | POA: Diagnosis not present

## 2023-12-03 DIAGNOSIS — K219 Gastro-esophageal reflux disease without esophagitis: Secondary | ICD-10-CM | POA: Diagnosis not present

## 2023-12-03 LAB — NITRIC OXIDE: Nitric Oxide: 13

## 2023-12-03 NOTE — Patient Instructions (Signed)
 VISIT SUMMARY:  Today, you came in for a follow-up visit to discuss your asthma and recent cold. You mentioned that your recent cold improved significantly with antibiotics, and you are experiencing fewer symptoms than in the past. You also noted some residual chest symptoms and occasional cough and hoarseness. We discussed your current asthma management and the effectiveness of Arnuity in controlling your symptoms.  YOUR PLAN:  -ASTHMA: Asthma is a condition where your airways narrow and swell, producing extra mucus, which can make breathing difficult. Your asthma is well-controlled with Arnuity, but you experience occasional chest tightness during minimal exertion, likely due to a lack of recent exercise. We discussed the potential need to increase your Arnuity dose if your symptoms worsen, especially in the spring. For now, continue taking Arnuity as prescribed and follow up in 4 months.  -BRONCHITIS: Recently treated and resolved with antibiotics. Your recent cold episode resolved within 7-10 days, which is a significant improvement from previous episodes that lasted up to a month. You have some residual symptoms like occasional cough and hoarseness, but no significant shortness of breath or complications. We will monitor for any recurrence of symptoms.  INSTRUCTIONS:  Please continue taking Arnuity as prescribed and follow up in 4 months. If your asthma symptoms worsen, especially in the spring, consider increasing your Arnuity dose. Monitor for any recurrence of cold symptoms and report any significant changes.

## 2023-12-03 NOTE — Progress Notes (Signed)
 Subjective:    Patient ID: Amy Vang, female    DOB: 09-07-1946, 78 y.o.   MRN: [id number]  Patient Care Team: Claudene Pellet, MD as PCP - General (Family Medicine) Melanee Annah BROCKS, MD as Consulting Physician (Oncology)  Chief Complaint  Patient presents with   Follow-up    DOE. No wheezing. Cough, dry.    BACKGROUND/INTERVAL:Amy Vang is a 78 year old lifelong never smoker who presents for follow-up on the issue of cough, hoarseness and wheezing that occurs 2-3 times per year.  She has moderate persistent asthma and chronic rhinosinusitis.  She was last seen 24 August 2023 and at that time instructed to continue Arnuity Ellipta  100, 1 puff daily and albuterol  as needed.  Previously had issues with chronic cough resolved with the Arnuity.  HPI Discussed the use of AI scribe software for clinical note transcription with the patient, who gave verbal consent to proceed.  History of Present Illness   Amy Vang, a patient with a history of asthma and chronic rhinosinusitis, presents for a follow-up visit. She reports a recent upper respiratory infection that has improved with antibiotics. However, she notes residual chest symptoms, including a sensation of crackling. This episode of illness was resolved within a week to ten days, a significant improvement from previous episodes that would last up to a month. She attributes this improvement to the use of Arnuity.  She occasionally experiences cough and hoarseness, but denies any significant shortness of breath. She does note some tightness in the chest when walking short distances, such as to the mailbox, but attributes this to a lack of recent exercise. She has been taking Arnuity daily and expresses gratitude for its effectiveness in managing her symptoms.   DATA 07/13/2023 allergen panel/IgE: No specific allergen IgE detected.  IgE total 94 IU/mL (normal) 08/10/2023 PFTs: FEV1 1.55 L or 73% predicted FVC 2.61 L or 92%  predicted, FEV1/FVC 60%, lung volumes normal diffusion capacity mildly reduced, corrects for alveolar volume.  There is significant bronchodilator response.  Consistent with mild to moderate obstructive airways disease, reversible.  Review of Systems A 10 point review of systems was performed and it is as noted above otherwise negative.   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   Abnormal vaginal bleeding 12/17/2021   Allergic rhinitis 12/17/2021   Adjustment disorder 12/17/2021   Cholelithiasis without obstruction 12/17/2021   Constipation 12/17/2021   Degeneration of lumbar intervertebral disc 12/17/2021   Diverticulosis of sigmoid colon 12/17/2021   Fatty liver 12/17/2021   Functional abdominal pain syndrome 12/17/2021   Gastro-esophageal reflux disease without esophagitis 12/17/2021   Hypo-osmolality and hyponatremia 12/17/2021   Internal hemorrhoids 12/17/2021   Irritable bowel syndrome without diarrhea 12/17/2021   Lymphocytosis 12/17/2021   Nausea and vomiting 12/17/2021   Overactive bladder 12/17/2021   History of colonic polyps 12/17/2021   Personal history of transient ischemic attack (TIA), and cerebral infarction without residual deficits 12/17/2021   Insomnia 12/17/2021   Pure hypercholesterolemia 12/17/2021   Restless legs syndrome 12/17/2021   Rosacea 12/17/2021   Sleep-wake schedule disorder, 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   Piriformis syndrome of right side 01/01/2021   Lower limb pain, inferior, right 09/19/2020   Lumbosacral radiculitis 09/19/2020   Body mass index (BMI) 25.0-25.9, adult 06/17/2020   Multiple thyroid  nodules 03/11/2020   Annular tear of lumbar disc 01/24/2020   Acute back pain with sciatica 12/28/2019   Encounter for orthopedic  follow-up care 01/17/2019   Partial thickness rotator cuff tear 11/07/2018   Acquired trigger finger 10/28/2018   Pain in joint of right shoulder  05/06/2018   Hoarseness 12/22/2017   Cervical spondylosis with radiculopathy 10/25/2017   Neck pain 08/31/2017   Other cervical disc displacement, unspecified cervical region 08/31/2017   Sinusitis, chronic 11/18/2016   Deviated septum 09/18/2016   Dyspnea and respiratory abnormality 05/21/2016   Wheeze 05/21/2016   Chronic throat clearing 05/21/2016   Pulmonary HTN (HCC) 04/23/2016   Moderate persistent asthma 11/12/2015   Allergic rhinoconjunctivitis 11/12/2015   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   SOB (shortness of breath) 03/06/2015   OSA (obstructive sleep apnea) 01/01/2014   Benign essential HTN 01/01/2014   Obesity 01/01/2014    Social History   Tobacco Use   Smoking status: Never   Smokeless tobacco: Never  Substance Use Topics   Alcohol use: No    Allergies  Allergen Reactions   Nsaids      Abdominal Pain   Ambien  [Zolpidem ] Other (See Comments)    groggy   Cefuroxime     Other Reaction(s): doesn't work   Hydroxyzine Hcl Other (See Comments)    UNSPECIFIED REACTION   Penicillin G Sodium Hives   Sulfamethoxazole-Trimethoprim Other (See Comments) and Swelling   Lactose Intolerance (Gi) Other (See Comments)    bloating   Septra [Sulfamethoxazole-Trimethoprim] Rash    Current Meds  Medication Sig   acetaminophen  (TYLENOL ) 650 MG CR tablet Take 650 mg by mouth every 8 (eight) hours as needed for pain.   albuterol  (VENTOLIN  HFA) 108 (90 Base) MCG/ACT inhaler Inhale 2 puffs into the lungs every 6 (six) hours as needed for wheezing or shortness of breath.   citalopram  (CELEXA ) 10 MG tablet Take 5 mg by mouth in the morning and at bedtime.   clonazePAM (KLONOPIN) 0.5 MG tablet Take 0.5 mg by mouth at bedtime.   cyclobenzaprine  (FLEXERIL ) 10 MG tablet Take 1 tablet (10 mg total) by mouth 3 (three) times daily as needed for muscle spasms. (Patient taking differently: Take 10 mg by mouth at bedtime as needed for muscle spasms.)   dicyclomine  (BENTYL ) 10 MG capsule TAKE 1  CAPSULE (10 MG TOTAL) BY MOUTH 4 (FOUR) TIMES DAILY AS NEEDED FOR SPASMS.   estradiol  (ESTRACE ) 0.1 MG/GM vaginal cream Apply a pea-sized amount to fingertip and wipe in vaginal introitus twice weekly   famotidine  (PEPCID ) 40 MG tablet SMARTSIG:1.0 Tablet(s) By Mouth Daily   fexofenadine (ALLEGRA) 180 MG tablet Take 180 mg by mouth in the morning.   fluticasone  (FLONASE ) 50 MCG/ACT nasal spray Place 1 spray into both nostrils daily as needed for allergies or rhinitis.   Fluticasone  Furoate (ARNUITY ELLIPTA ) 100 MCG/ACT AEPB Inhale 1 puff into the lungs daily.   hydrochlorothiazide  (HYDRODIURIL ) 25 MG tablet    HYDROcodone -acetaminophen  (NORCO/VICODIN) 5-325 MG tablet Take 1 tablet by mouth every 4 (four) hours as needed.   losartan  (COZAAR ) 50 MG tablet Take 1 tablet (50 mg total) by mouth in the morning.   methocarbamol (ROBAXIN) 500 MG tablet Take 500 mg by mouth as needed for muscle spasms.   ondansetron  (ZOFRAN ) 4 MG tablet TAKE 1 TABLET (4 MG TOTAL) BY MOUTH AS NEEDED FOR NAUSEA OR VOMITING.   pantoprazole  (PROTONIX ) 40 MG tablet TAKE 1 TABLET BY MOUTH TWICE A DAY   promethazine  (PHENERGAN ) 25 MG tablet TAKE 1 TABLET BY MOUTH EVERY 6 HOURS AS NEEDED.   sodium chloride  (OCEAN) 0.65 % SOLN nasal spray Place 1 spray  into both nostrils 3 (three) times daily as needed for congestion.   tamoxifen  (NOLVADEX ) 10 MG tablet Take 1 tablet (10 mg total) by mouth daily.   traMADol  (ULTRAM ) 50 MG tablet Take 1 tablet (50 mg total) by mouth every 6 (six) hours as needed for moderate pain or severe pain.   traZODone  (DESYREL ) 50 MG tablet Take 50 mg by mouth at bedtime.    Immunization History  Administered Date(s) Administered   Fluad Quad(high Dose 65+) 08/30/2022, 08/01/2023   Influenza Split 09/03/2009, 09/17/2010, 09/03/2012, 08/22/2015, 08/11/2019, 01/29/2021   Influenza, High Dose Seasonal PF 09/05/2015, 09/01/2016, 09/06/2017, 10/11/2018, 08/18/2019   Influenza,inj,Quad PF,6+ Mos 08/30/2016    Influenza-Unspecified 08/26/2017   PFIZER(Purple Top)SARS-COV-2 Vaccination 12/23/2019, 01/13/2020, 08/27/2020, 05/06/2021   Pneumococcal Conjugate-13 12/13/2013, 09/01/2016   Pneumococcal Polysaccharide-23 09/28/2011, 09/01/2016   Tdap 03/07/2007, 09/08/2018   Zoster Recombinant(Shingrix) 11/09/2017   Zoster, Live 03/28/2008, 08/20/2017, 11/09/2017        Objective:   BP 118/82 (BP Location: Right Arm, Cuff Size: Normal)   Pulse 61   Ht 5' 4 (1.626 m)   Wt 172 lb 9.6 oz (78.3 kg)   SpO2 99%   BMI 29.63 kg/m   SpO2: 99 % O2 Device: None (Room air)  GENERAL: Well-developed, somewhat overweight woman, no acute distress, fully ambulatory.  No conversational dyspnea. HEAD: Normocephalic, atraumatic.  EYES: Pupils equal, round, reactive to light.  No scleral icterus.  MOUTH: Teeth intact, oral mucosa moist.  No thrush. NECK: Supple. No thyromegaly. Trachea midline. No JVD.  No adenopathy. PULMONARY: Good air entry bilaterally.  No adventitious sounds. CARDIOVASCULAR: S1 and S2. Regular rate and rhythm.  No rubs, murmurs or gallops heard. ABDOMEN: Benign. MUSCULOSKELETAL: No joint deformity, no clubbing, no edema.  NEUROLOGIC: No overt focal deficit, no gait disturbance, speech is fluent. SKIN: Intact,warm,dry. PSYCH: Mood and behavior normal  Lab Results  Component Value Date   NITRICOXIDE 13 12/03/2023   Assessment & Plan:     ICD-10-CM   1. Moderate persistent asthma without complication  J45.40 Nitric oxide     2. Chronic cough  R05.3     3. Chronic rhinosinusitis  J32.9     4. Laryngopharyngeal reflux (LPR)  K21.9       Orders Placed This Encounter  Procedures   Nitric oxide     Discussion:    Asthma Asthma is well-controlled with Arnuity. Reports occasional chest tightness during minimal exertion, likely due to deconditioning. No significant dyspnea. Low nitric oxide  levels indicate good control. Discussed potential need to increase Arnuity if symptoms  exacerbate, particularly in spring. - Continue Arnuity as prescribed - Follow up in 4 months - Consider increasing Arnuity if symptoms worsen, especially in spring  Chronic rhinosinusitis Recent upper respiratory infection episode resolved within 7-10 days, an improvement from previous month-long episodes. Residual symptoms include occasional cough and hoarseness. No significant dyspnea or complications. - Monitor for recurrence of symptoms.     Advised if symptoms do not improve or worsen, to please contact office for sooner follow up or seek emergency care.    I spent 32 minutes of dedicated to the care of this patient on the date of this encounter to include pre-visit review of records, face-to-face time with the patient discussing conditions above, post visit ordering of testing, clinical documentation with the electronic health record, making appropriate referrals as documented, and communicating necessary findings to members of the patients care team.     C. Leita Sanders, MD Advanced Bronchoscopy P[facility]    *  This note was generated using voice recognition software/Dragon and/or AI transcription program.  Despite best efforts to proofread, errors can occur which can change the meaning. Any transcriptional errors that result from this process are unintentional and may not be fully corrected at the time of dictation.

## 2023-12-06 ENCOUNTER — Encounter: Payer: Self-pay | Admitting: Pulmonary Disease

## 2023-12-11 ENCOUNTER — Other Ambulatory Visit: Payer: Self-pay | Admitting: Oncology

## 2023-12-14 ENCOUNTER — Encounter: Payer: Self-pay | Admitting: Pulmonary Disease

## 2023-12-14 MED ORDER — IPRATROPIUM BROMIDE 0.03 % NA SOLN: 2.0000 | Freq: Two times a day (BID) | NASAL | 12 refills | Status: AC

## 2023-12-14 NOTE — Telephone Encounter (Signed)
 Prescription for Atrovent sent to her pharmacy.

## 2024-01-06 ENCOUNTER — Other Ambulatory Visit
Admission: RE | Admit: 2024-01-06 | Discharge: 2024-01-06 | Disposition: A | Discharge status: Home / Self Care | Payer: HMO | Attending: Ophthalmology | Admitting: Ophthalmology

## 2024-01-06 DIAGNOSIS — G4452 New daily persistent headache (NDPH): Secondary | ICD-10-CM | POA: Diagnosis present

## 2024-01-06 LAB — CBC
HCT: 36.6 % (ref 36.0–46.0)
Hemoglobin: 12.3 g/dL (ref 12.0–15.0)
MCH: 31.4 pg (ref 26.0–34.0)
MCHC: 33.6 g/dL (ref 30.0–36.0)
MCV: 93.4 fL (ref 80.0–100.0)
Platelets: 179 10*3/uL (ref 150–400)
RBC: 3.92 MIL/uL (ref 3.87–5.11)
RDW: 12.6 % (ref 11.5–15.5)
WBC: 6.4 10*3/uL (ref 4.0–10.5)
nRBC: 0 % (ref 0.0–0.2)

## 2024-01-06 LAB — C-REACTIVE PROTEIN: CRP: 1 mg/dL — ABNORMAL HIGH (ref <1.0)

## 2024-01-06 LAB — SEDIMENTATION RATE: Sed Rate: 2 mm/h (ref 0–30)

## 2024-01-10 ENCOUNTER — Encounter: Payer: Self-pay | Admitting: Pulmonary Disease

## 2024-01-10 ENCOUNTER — Ambulatory Visit
Admission: RE | Admit: 2024-01-10 | Discharge: 2024-01-10 | Disposition: A | Discharge status: Home / Self Care | Payer: HMO | Source: Ambulatory Visit | Attending: Oncology | Admitting: Oncology

## 2024-01-10 DIAGNOSIS — Z08 Encounter for follow-up examination after completed treatment for malignant neoplasm: Secondary | ICD-10-CM

## 2024-01-10 MED ORDER — ARNUITY ELLIPTA 200 MCG/ACT IN AEPB: 1.0000 | INHALATION_SPRAY | Freq: Every day | RESPIRATORY_TRACT | 6 refills | Status: DC

## 2024-01-10 NOTE — Telephone Encounter (Signed)
 I sent prescription for Arnuity 200 to her pharmacy.

## 2024-01-27 ENCOUNTER — Encounter: Payer: Self-pay | Admitting: Pulmonary Disease

## 2024-01-27 DIAGNOSIS — U071 COVID-19: Secondary | ICD-10-CM

## 2024-01-27 NOTE — Telephone Encounter (Signed)
 It may be related to the Arnuity if she is not rinsing well after she uses it.  I recommend that she got a small amount of baking soda to the water that she uses to rinse with.  Not only rinse and spit but also do 1 final gargle and spit.  If this does not resolve it and may be related to thrush and she would need medication for that.

## 2024-01-28 MED ORDER — MOLNUPIRAVIR EUA 200MG CAPSULE: 4.0000 | ORAL_CAPSULE | Freq: Two times a day (BID) | ORAL | 0 refills | Status: AC

## 2024-01-28 NOTE — Telephone Encounter (Signed)
 I sent a prescription for molnupiravir as we have been encountering significant shortages on Paxlovid.  Molnupiravir should work as well.  This explains her sore throat.

## 2024-02-09 MED ORDER — AZITHROMYCIN 250 MG PO TABS: ORAL_TABLET | ORAL | 0 refills | Status: AC

## 2024-02-09 MED ORDER — METHYLPREDNISOLONE 4 MG PO TBPK: ORAL_TABLET | ORAL | 0 refills | Status: DC

## 2024-02-09 NOTE — Addendum Note (Signed)
 Addended by: Salena Saner on: 02/09/2024 10:37 AM   Modules accepted: Orders

## 2024-02-18 ENCOUNTER — Inpatient Hospital Stay: Payer: HMO | Attending: Oncology | Admitting: Nurse Practitioner

## 2024-02-18 ENCOUNTER — Encounter: Payer: Self-pay | Admitting: Nurse Practitioner

## 2024-02-18 VITALS — BP 134/70 | HR 70 | Temp 97.3°F | Resp 16 | Wt 173.0 lb

## 2024-02-18 DIAGNOSIS — D0511 Intraductal carcinoma in situ of right breast: Secondary | ICD-10-CM | POA: Insufficient documentation

## 2024-02-18 DIAGNOSIS — Z79810 Long term (current) use of selective estrogen receptor modulators (SERMs): Secondary | ICD-10-CM | POA: Diagnosis not present

## 2024-02-18 DIAGNOSIS — Z08 Encounter for follow-up examination after completed treatment for malignant neoplasm: Secondary | ICD-10-CM

## 2024-02-18 DIAGNOSIS — Z5181 Encounter for therapeutic drug level monitoring: Secondary | ICD-10-CM

## 2024-02-18 MED ORDER — TAMOXIFEN CITRATE 10 MG PO TABS
5.0000 mg | ORAL_TABLET | Freq: Every day | ORAL | 1 refills | Status: DC
Start: 2024-02-18 — End: 2024-02-18

## 2024-02-18 MED ORDER — TAMOXIFEN CITRATE 10 MG PO TABS: 10.0000 mg | ORAL_TABLET | Freq: Every day | ORAL | 3 refills | Status: AC

## 2024-02-18 NOTE — Progress Notes (Signed)
 Hematology/Oncology Consult Note [facility]  Telephone:([phone] [phone]  Patient Care Team: Merri Brunette, MD as PCP - General (Family Medicine) Creig Hines, MD as Consulting Physician (Oncology)   Name of the patient: Amy Vang  [id number]  1946/05/31   Date of visit: 02/18/24  Diagnosis-right breast DCIS ER positive  Chief complaint/ Reason for visit- routine follow-up of right breast DCIS  Heme/Onc history: Patient presented as a 78 year old female diagnosed with right breast DCIS in February 2024. Patient underwent right lumpectomy on 03/10/2023 which showed intermediate nuclear grade cribriform type DCIS without necrosis and negative for invasive carcinoma. DCIS measured 4 mm in greatest dimension additional medial and superior margin negative for carcinoma margins were negative for DCIS but close at 0.75 mm from the posterior margin.  She underwent reexcision surgery with negative margins.  Given the small size of DCIS and her age adjuvant radiation therapy was not deemed to be necessary.  Patient started tamoxifen and but is taking a lower dose of 10 mg daily since May 2024 d/t arthralgias and headache.   Interval history- Patient is 78 year old female with above history of right breast dcis s/p lumpectomy followed by reexcision with negative margins, no radiation, on tamoxifen, who returns to clinic for follow up. She was worried about possible right breast mass/lump felt during self exam. No skin changes. Continues to tolerate dose reduced tamoxifen well without significant side effects.   ECOG PS- 1 Pain scale- 0  Review of systems- Review of Systems  Constitutional:  Negative for chills, fever, malaise/fatigue and weight loss.  HENT:  Negative for congestion, ear discharge and nosebleeds.   Eyes:  Negative for blurred vision.  Respiratory:  Negative for cough, hemoptysis, sputum production, shortness of breath and wheezing.    Cardiovascular:  Negative for chest pain, palpitations, orthopnea and claudication.  Gastrointestinal:  Negative for abdominal pain, blood in stool, constipation, diarrhea, heartburn, melena, nausea and vomiting.  Genitourinary:  Negative for dysuria, flank pain, frequency, hematuria and urgency.  Musculoskeletal:  Negative for back pain, joint pain and myalgias.  Skin:  Negative for rash.  Neurological:  Negative for dizziness, tingling, focal weakness, seizures, weakness and headaches.  Endo/Heme/Allergies:  Does not bruise/bleed easily.  Psychiatric/Behavioral:  Negative for depression and suicidal ideas. The patient does not have insomnia.     Allergies  Allergen Reactions   Nsaids      Abdominal Pain   Ambien [Zolpidem] Other (See Comments)    groggy   Cefuroxime     Other Reaction(s): doesn't work   Hydroxyzine Hcl Other (See Comments)    UNSPECIFIED REACTION   Penicillin G Sodium Hives   Sulfamethoxazole-Trimethoprim Other (See Comments) and Swelling   Lactose Intolerance (Gi) Other (See Comments)    bloating   Septra [Sulfamethoxazole-Trimethoprim] Rash   Past Medical History:  Diagnosis Date   Allergic rhinitis    Allergy    seasonal   Anemia    Anxiety    Cancer (HCC)    Cataract    removed bilateraly   Chronic constipation    COVID-19 08/2019   Degenerative disc disease, cervical    Essential hypertension    GERD (gastroesophageal reflux disease) 1957   H/O bronchitis    History of echocardiogram    in epic 04-21-2019,  normal w/ ef 60-65% and mild mitral regurg, no stenosis or restriction   History of TIAs    per pt age 54;  2008;  05/ 2013;  pt  stated no residual was from sleep doctor more than likely sleep deprived than actual tia   Motion sickness    cars   Nocturia    OSA on CPAP    Severe ,, CPAP 12 MM H2O- Dr Mayford Knife   PONV (postoperative nausea and vomiting)    Restless leg syndrome    Rosacea    Seasonal asthma    no inhaler   Sleep apnea     uses CPAP   Stroke (HCC) 1985   TIAx2   Past Surgical History:  Procedure Laterality Date   ANTERIOR CERVICAL DECOMP/DISCECTOMY FUSION N/A 10/25/2017   Procedure: CERVICAL SIX- CERVICAL SEVEN ANTERIOR CERVICAL DECOMPRESSION/DISCECTOMY FUSION, INTERBODY PROSTHESIS AND ANTERIOR PLATING;  Surgeon: Tressie Stalker, MD;  Location: [facility];  Service: Neurosurgery;  Laterality: N/A;  CERVICAL 6- CERVICAL 7 ANTERIOR CERVICAL DECOMPRESSION/DISCECTOMY FUSION, INTERBODY PROSTHESIS AND ANTERIOR PLATING   BACK SURGERY  04/2022   BILATERAL BENIGN BREAST BX'S  20 YRS AGO   BREAST BIOPSY Right    benign   BREAST BIOPSY Right 01/27/2023   MM RT BREAST BX W LOC DEV 1ST LESION IMAGE BX SPEC STEREO GUIDE 01/27/2023 [facility]   BREAST BIOPSY Right 02/05/2023   MM RT BREAST BX W LOC DEV 1ST LESION IMAGE BX SPEC STEREO GUIDE 02/05/2023 [facility]   BREAST BIOPSY  03/09/2023   MM RT RADIOACTIVE SEED LOC MAMMO GUIDE 03/09/2023 [facility]   BREAST BIOPSY  03/09/2023   MM RT RADIOACTIVE SEED EA ADD LESION LOC MAMMO GUIDE 03/09/2023 [facility]   BREAST EXCISIONAL BIOPSY Bilateral over 10 years ago   benign   BREAST LUMPECTOMY WITH RADIOACTIVE SEED LOCALIZATION Right 03/10/2023   Procedure: RIGHT BREAST LUMPECTOMY WITH RADIOACTIVE SEED LOCALIZATION;  Surgeon: Emelia Loron, MD;  Location: [facility] OR;  Service: General;  Laterality: Right;   CARDIAC CATHETERIZATION N/A 08/13/2016   Procedure: Right Heart Cath;  Surgeon: Laurey Morale, MD;  Location: [facility] INVASIVE CV LAB;  Service: Cardiovascular;  Laterality: N/A;    normal study, no evidence pulm HTN   CARPAL TUNNEL RELEASE  2009   BILATERAL   CATARACT EXTRACTION W/PHACO Right 02/27/2020   Procedure: CATARACT EXTRACTION PHACO AND INTRAOCULAR LENS PLACEMENT (IOC) RIGHT 3.60 00:23.9;  Surgeon: Galen Manila, MD;  Location: [facility];  Service: Ophthalmology;  Laterality: Right;  sleep apnea   CATARACT EXTRACTION W/PHACO Left  05/21/2020   Procedure: CATARACT EXTRACTION PHACO AND INTRAOCULAR LENS PLACEMENT (IOC) LEFT 3.69 00:30.9;  Surgeon: Galen Manila, MD;  Location: [facility];  Service: Ophthalmology;  Laterality: Left;  sleep apnea   CHOLECYSTECTOMY N/A 03/25/2021   Procedure: LAPAROSCOPIC CHOLECYSTECTOMY;  Surgeon: Emelia Loron, MD;  Location: [facility] OR;  Service: General;  Laterality: N/A;   CYSTOCELE REPAIR  12/11/2011   Procedure: ANTERIOR REPAIR (CYSTOCELE);  Surgeon: Garnett Farm, MD;  Location: [facility];  Service: Urology;  Laterality: N/A;  1 1/2 hour requested for case  Anterior repair and mid Urethral Sling   INCISIONAL HERNIA REPAIR N/A 12/24/2021   Procedure: LAP ASSISTED INCISIONAL HERNIA REPAIR WITH MESH;  Surgeon: Emelia Loron, MD;  Location: [facility] OR;  Service: General;  Laterality: N/A;  GEN & TAP BLOCK RNFA   MASS EXCISION N/A 07/24/2019   Procedure: EXCISION OF VAGINAL FOREIGN BODY;  Surgeon: Ihor Gully, MD;  Location: [facility];  Service: Urology;  Laterality: N/A;   NASAL SEPTOPLASTY W/ TURBINOPLASTY Bilateral 11/18/2016   Procedure: NASAL SEPTOPLASTY WITH BILATERAL TURBINATE REDUCTION;  Surgeon: Onalee Hua  Annalee Genta, MD;  Location: [facility] OR;  Service: ENT;  Laterality: Bilateral;   NONINVASIVE VASCULAR CAROTID STUDY  09/14/2007   BILATERAL MILD MIX PLAQUE THROUGHOUT, NO SIGNIFICANT BILATERAL ICA STENOSIS   PUBOVAGINAL SLING  12/11/2011   Procedure: Leonides Grills;  Surgeon: Garnett Farm, MD;  Location: [facility];  Service: Urology;  Laterality: N/A;   PULLEY RELEASE RIGHT THUMB  2009   RADIOACTIVE SEED GUIDED EXCISIONAL BREAST BIOPSY Right 03/10/2023   Procedure: RADIOACTIVE SEED GUIDED EXCISIONAL RIGHT BREAST BIOPSY;  Surgeon: Emelia Loron, MD;  Location: [facility] OR;  Service: General;  Laterality: Right;   RE-EXCISION OF BREAST LUMPECTOMY Right 03/30/2023   Procedure: RE-EXCISION OF RIGHT BREAST LUMPECTOMY;   Surgeon: Emelia Loron, MD;  Location: [facility];  Service: General;  Laterality: Right;   SHOULDER ARTHROSCOPY DISTAL CLAVICLE EXCISION AND OPEN ROTATOR CUFF REPAIR Right 01-17-2019   dr supple @[facility]    SINUS ENDO WITH FUSION Bilateral 11/18/2016   Procedure: BILATERAL ENDOSCOPIC SINUS SURGERY;  Surgeon: Osborn Coho, MD;  Location: [facility] OR;  Service: ENT;  Laterality: Bilateral;   UMBILICAL HERNIA REPAIR N/A 03/25/2021   Procedure: Primary REPAIR UMBILICAL HERNIA;  Surgeon: Emelia Loron, MD;  Location: [facility] OR;  Service: General;  Laterality: N/A;   UPPER GASTROINTESTINAL ENDOSCOPY     VAGINAL HYSTERECTOMY  AGE 78   Social History   Socioeconomic History   Marital status: Married    Spouse name: Not on file   Number of children: 2   Years of education: Not on file   Highest education level: Not on file  Occupational History   Not on file  Tobacco Use   Smoking status: Never   Smokeless tobacco: Never  Vaping Use   Vaping status: Never Used  Substance and Sexual Activity   Alcohol use: No   Drug use: No   Sexual activity: Not on file  Other Topics Concern   Not on file  Social History Narrative   Lives with husband.    Retired Environmental health practitioner   Has 2 children.   Social Drivers of Corporate investment banker Strain: Not on file  Food Insecurity: No Food Insecurity (03/16/2023)   Hunger Vital Sign    Worried About Running Out of Food in the Last Year: Never true    Ran Out of Food in the Last Year: Never true  Transportation Needs: No Transportation Needs (03/16/2023)   PRAPARE - Administrator, Civil Service (Medical): No    Lack of Transportation (Non-Medical): No  Physical Activity: Not on file  Stress: Not on file  Social Connections: Not on file  Intimate Partner Violence: Not At Risk (03/16/2023)   Humiliation, Afraid, Rape, and Kick questionnaire    Fear of Current or Ex-Partner: No    Emotionally Abused: No     Physically Abused: No    Sexually Abused: No   Family History  Adopted: Yes  Problem Relation Age of Onset   Diabetes Mother    Stroke Mother    Alzheimer's disease Mother    Hypertension Sister    Diabetes Sister    Heart attack Brother    Leukemia Brother    CAD Brother    Diabetes Brother    Colon cancer Maternal Aunt    Stroke Paternal Grandmother    Heart attack Paternal Grandfather    Esophageal cancer Neg Hx    Rectal cancer Neg Hx    Stomach cancer Neg Hx  Current Outpatient Medications:    acetaminophen (TYLENOL) 650 MG CR tablet, Take 650 mg by mouth every 8 (eight) hours as needed for pain., Disp: , Rfl:    albuterol (VENTOLIN HFA) 108 (90 Base) MCG/ACT inhaler, Inhale 2 puffs into the lungs every 6 (six) hours as needed for wheezing or shortness of breath., Disp: 8 g, Rfl: 2   citalopram (CELEXA) 10 MG tablet, Take 5 mg by mouth in the morning and at bedtime., Disp: , Rfl:    clonazePAM (KLONOPIN) 0.5 MG tablet, Take 0.5 mg by mouth at bedtime., Disp: , Rfl:    cyclobenzaprine (FLEXERIL) 10 MG tablet, Take 1 tablet (10 mg total) by mouth 3 (three) times daily as needed for muscle spasms. (Patient taking differently: Take 10 mg by mouth at bedtime as needed for muscle spasms.), Disp: 50 tablet, Rfl: 1   dicyclomine (BENTYL) 10 MG capsule, TAKE 1 CAPSULE (10 MG TOTAL) BY MOUTH 4 (FOUR) TIMES DAILY AS NEEDED FOR SPASMS., Disp: 360 capsule, Rfl: 1   estradiol (ESTRACE) 0.1 MG/GM vaginal cream, Apply a pea-sized amount to fingertip and wipe in vaginal introitus twice weekly, Disp: 42.5 g, Rfl: 1   famotidine (PEPCID) 40 MG tablet, SMARTSIG:1.0 Tablet(s) By Mouth Daily, Disp: , Rfl:    fexofenadine (ALLEGRA) 180 MG tablet, Take 180 mg by mouth in the morning., Disp: , Rfl:    fluticasone (FLONASE) 50 MCG/ACT nasal spray, Place 1 spray into both nostrils daily as needed for allergies or rhinitis., Disp: , Rfl:    Fluticasone Furoate (ARNUITY ELLIPTA) 200 MCG/ACT AEPB, Inhale 1  puff into the lungs daily., Disp: 30 each, Rfl: 6   hydrochlorothiazide (HYDRODIURIL) 25 MG tablet, , Disp: , Rfl:    HYDROcodone-acetaminophen (NORCO/VICODIN) 5-325 MG tablet, Take 1 tablet by mouth every 4 (four) hours as needed., Disp: , Rfl:    ipratropium (ATROVENT) 0.03 % nasal spray, Place 2 sprays into both nostrils every 12 (twelve) hours., Disp: 30 mL, Rfl: 12   losartan (COZAAR) 50 MG tablet, Take 1 tablet (50 mg total) by mouth in the morning., Disp: 90 tablet, Rfl: 3   methocarbamol (ROBAXIN) 500 MG tablet, Take 500 mg by mouth as needed for muscle spasms., Disp: , Rfl:    methylPREDNISolone (MEDROL DOSEPAK) 4 MG TBPK tablet, Take as directed on the package, Disp: 21 tablet, Rfl: 0   ondansetron (ZOFRAN) 4 MG tablet, TAKE 1 TABLET (4 MG TOTAL) BY MOUTH AS NEEDED FOR NAUSEA OR VOMITING., Disp: 18 tablet, Rfl: 4   pantoprazole (PROTONIX) 40 MG tablet, TAKE 1 TABLET BY MOUTH TWICE A DAY, Disp: 180 tablet, Rfl: 2   promethazine (PHENERGAN) 25 MG tablet, TAKE 1 TABLET BY MOUTH EVERY 6 HOURS AS NEEDED., Disp: 30 tablet, Rfl: 3   sodium chloride (OCEAN) 0.65 % SOLN nasal spray, Place 1 spray into both nostrils 3 (three) times daily as needed for congestion., Disp: , Rfl:    tamoxifen (NOLVADEX) 10 MG tablet, TAKE 0.5 TABLETS (5 MG TOTAL) BY MOUTH DAILY. PATIENT JUST NEEDS TO TAKE 1/2 TABLET DAILY, Disp: 45 tablet, Rfl: 1   traMADol (ULTRAM) 50 MG tablet, Take 1 tablet (50 mg total) by mouth every 6 (six) hours as needed for moderate pain or severe pain., Disp: 10 tablet, Rfl: 0   traZODone (DESYREL) 50 MG tablet, Take 50 mg by mouth at bedtime., Disp: , Rfl:   Physical exam:  Vitals:   02/18/24 1037  BP: 134/70  Pulse: 70  Resp: 16  Temp: (!) 97.3  F (36.3 C)  TempSrc: Tympanic  SpO2: 100%  Weight: 173 lb (78.5 kg)   Physical Exam Vitals reviewed.  Constitutional:      Appearance: She is not ill-appearing.  Cardiovascular:     Rate and Rhythm: Normal rate and regular rhythm.   Pulmonary:     Effort: Pulmonary effort is normal.     Breath sounds: Normal breath sounds.  Chest:     Comments: Breast exam was performed in seated and lying down position. Patient is status post right lumpectomy with a well-healed surgical scar. No evidence of any palpable masses. No evidence of axillary adenopathy. No evidence of any palpable masses or lumps in the left breast. No evidence of left axillary adenopathy   Skin:    General: Skin is warm and dry.  Neurological:     Mental Status: She is alert and oriented to person, place, and time.  Psychiatric:        Mood and Affect: Mood normal.        Behavior: Behavior normal.       Latest Ref Rng & Units 08/20/2023   10:00 AM  CMP  Glucose 70 - 99 mg/dL 244   BUN 8 - 23 mg/dL 15   Creatinine 0.10 - 1.00 mg/dL 2.72   Sodium 536 - 644 mmol/L 135   Potassium 3.5 - 5.1 mmol/L 3.7   Chloride 98 - 111 mmol/L 102   CO2 22 - 32 mmol/L 28   Calcium 8.9 - 10.3 mg/dL 8.5   Total Protein 6.5 - 8.1 g/dL 6.8   Total Bilirubin 0.3 - 1.2 mg/dL 0.7   Alkaline Phos 38 - 126 U/L 49   AST 15 - 41 U/L 18   ALT 0 - 44 U/L 15       Latest Ref Rng & Units 01/06/2024    2:55 PM  CBC  WBC 4.0 - 10.5 K/uL 6.4   Hemoglobin 12.0 - 15.0 g/dL 03.4   Hematocrit 74.2 - 46.0 % 36.6   Platelets 150 - 400 K/uL 179    Radiology review:  01/10/24- MM 3D Diagnostic Mammogram Bilateral ACR Breast Density Category c: The breasts are heterogeneously dense, which may obscure small masses.   FINDINGS: Lumpectomy changes are seen in the right breast. No suspicious mass or malignant type microcalcifications identified in either breast.   IMPRESSION: No evidence of malignancy in either breast.   RECOMMENDATION: Bilateral diagnostic mammogram in 1 year is recommended.    BI-RADS CATEGORY  2: Benign.   Electronically Signed   By: Baird Lyons M.D.   On: 01/10/2024 12:20   Assessment and plan- Patient is a 78 y.o. female   History of right breast DCIS  - ER positive, currently on tamoxifen. 01/2024 mammogram was benign appearing. Clinically without signs or symptoms of recurrence today. She will continue low dose tamoxifen 10 mg daily. Refill provided today. Plan to repeat mammogram Feb 2026. LFTs previously normal.   Disposition:  6 mo- Dr Smith Robert- [place]   Visit Diagnosis 1. Encounter for follow-up surveillance of ductal carcinoma in situ (DCIS) of breast   2. Encounter for monitoring tamoxifen therapy    Consuello Masse, DNP, AGNP-C, [facility] [phone] (clinic) 02/18/2024

## 2024-02-26 ENCOUNTER — Encounter: Payer: Self-pay | Admitting: Internal Medicine

## 2024-02-29 ENCOUNTER — Encounter: Payer: Self-pay | Admitting: Internal Medicine

## 2024-02-29 DIAGNOSIS — H93A9 Pulsatile tinnitus, unspecified ear: Secondary | ICD-10-CM

## 2024-02-29 DIAGNOSIS — Z8679 Personal history of other diseases of the circulatory system: Secondary | ICD-10-CM

## 2024-02-29 HISTORY — DX: Pulsatile tinnitus, unspecified ear: H93.A9

## 2024-02-29 HISTORY — DX: Personal history of other diseases of the circulatory system: Z86.79

## 2024-03-15 DIAGNOSIS — G441 Vascular headache, not elsewhere classified: Secondary | ICD-10-CM | POA: Diagnosis not present

## 2024-03-16 ENCOUNTER — Other Ambulatory Visit: Payer: Self-pay | Admitting: Family Medicine

## 2024-03-16 DIAGNOSIS — G441 Vascular headache, not elsewhere classified: Secondary | ICD-10-CM

## 2024-03-18 ENCOUNTER — Ambulatory Visit
Admission: RE | Admit: 2024-03-18 | Discharge: 2024-03-18 | Disposition: A | Discharge status: Home / Self Care | Source: Ambulatory Visit | Attending: Family Medicine | Admitting: Family Medicine

## 2024-03-18 DIAGNOSIS — I6622 Occlusion and stenosis of left posterior cerebral artery: Secondary | ICD-10-CM | POA: Diagnosis not present

## 2024-03-18 DIAGNOSIS — G441 Vascular headache, not elsewhere classified: Secondary | ICD-10-CM

## 2024-03-21 DIAGNOSIS — G4733 Obstructive sleep apnea (adult) (pediatric): Secondary | ICD-10-CM | POA: Diagnosis not present

## 2024-03-22 ENCOUNTER — Other Ambulatory Visit (HOSPITAL_COMMUNITY): Payer: Self-pay | Admitting: Interventional Radiology

## 2024-03-22 DIAGNOSIS — I771 Stricture of artery: Secondary | ICD-10-CM

## 2024-03-28 ENCOUNTER — Ambulatory Visit (HOSPITAL_COMMUNITY)
Admission: RE | Admit: 2024-03-28 | Discharge: 2024-03-28 | Disposition: A | Discharge status: Home / Self Care | Source: Ambulatory Visit | Attending: Interventional Radiology | Admitting: Interventional Radiology

## 2024-03-28 DIAGNOSIS — I63512 Cerebral infarction due to unspecified occlusion or stenosis of left middle cerebral artery: Secondary | ICD-10-CM | POA: Diagnosis not present

## 2024-03-28 DIAGNOSIS — I771 Stricture of artery: Secondary | ICD-10-CM

## 2024-03-30 HISTORY — PX: IR RADIOLOGIST EVAL & MGMT: IMG5224

## 2024-03-31 ENCOUNTER — Ambulatory Visit (AMBULATORY_SURGERY_CENTER)

## 2024-03-31 ENCOUNTER — Other Ambulatory Visit (HOSPITAL_COMMUNITY): Payer: Self-pay | Admitting: Interventional Radiology

## 2024-03-31 VITALS — Ht 64.5 in | Wt 167.0 lb

## 2024-03-31 DIAGNOSIS — H93A2 Pulsatile tinnitus, left ear: Secondary | ICD-10-CM

## 2024-03-31 DIAGNOSIS — I771 Stricture of artery: Secondary | ICD-10-CM

## 2024-03-31 DIAGNOSIS — Z860100 Personal history of colon polyps, unspecified: Secondary | ICD-10-CM

## 2024-03-31 MED ORDER — NA SULFATE-K SULFATE-MG SULF 17.5-3.13-1.6 GM/177ML PO SOLN: 1.0000 | Freq: Once | ORAL | 0 refills | Status: AC

## 2024-03-31 NOTE — Progress Notes (Signed)
 Pre visit completed via phone call; Patient verified name, DOB, and address; No egg or soy allergy known to patient;  No issues known to pt with past sedation with any surgeries or procedures; Patient denies ever being told they had issues or difficulty with intubation;  No FH of Malignant Hyperthermia; Pt is not on diet pills; Pt is not on home 02;  Pt is not on blood thinners;  Pt reports issues with constipation/diarrhea- patient reports she takes Miralax  if she feels that she needs it;   No A fib or A flutter; Have any cardiac testing pending--NO Insurance verified during PV appt--- H[employer] Medicare  Pt can ambulate without assistance;  Pt denies use of chewing tobacco; Discussed diabetic/weight loss medication holds; Discussed NSAID holds; Checked BMI to be less than 50; Pt instructed to use Single[employer] or GoodRx for a price reduction on prep;  Patient's chart reviewed by Rogena Class CNRA prior to previsit and patient appropriate for the [facility]; Pre visit completed and red dot placed by patient's name on their procedure day (on provider's schedule);   Instructions sent to MyChart per patient request;

## 2024-04-01 DIAGNOSIS — G4733 Obstructive sleep apnea (adult) (pediatric): Secondary | ICD-10-CM | POA: Diagnosis not present

## 2024-04-02 DIAGNOSIS — G4733 Obstructive sleep apnea (adult) (pediatric): Secondary | ICD-10-CM | POA: Diagnosis not present

## 2024-04-05 ENCOUNTER — Ambulatory Visit: Payer: HMO | Admitting: Pulmonary Disease

## 2024-04-05 ENCOUNTER — Encounter: Payer: Self-pay | Admitting: Pulmonary Disease

## 2024-04-05 VITALS — BP 128/76 | HR 64 | Temp 97.8°F | Ht 64.5 in | Wt 171.8 lb

## 2024-04-05 DIAGNOSIS — K219 Gastro-esophageal reflux disease without esophagitis: Secondary | ICD-10-CM

## 2024-04-05 DIAGNOSIS — R053 Chronic cough: Secondary | ICD-10-CM | POA: Diagnosis not present

## 2024-04-05 DIAGNOSIS — J4540 Moderate persistent asthma, uncomplicated: Secondary | ICD-10-CM

## 2024-04-05 LAB — NITRIC OXIDE: Nitric Oxide: 26

## 2024-04-05 NOTE — Patient Instructions (Signed)
 VISIT SUMMARY:  Today, you came in because of a chronic cough that has worsened over the last few days, possibly due to spending more time outdoors. We discussed your asthma management and current symptoms.  YOUR PLAN:  -ASTHMA, COUGH VARIANT: Asthma is a condition where your airways narrow and swell, producing extra mucus, which can make breathing difficult. Your asthma is currently well-controlled with your Arnuity inhaler, which was recently increased to 200 micrograms. You rarely need to use your albuterol  inhaler. To help with your throat irritation and chronic cough, you can use throat lozenges or take sips of water .  Make sure throat lozenges do not have any mint or menthol  in them.  We will continue with your current medication and monitor your symptoms.  INSTRUCTIONS:  Please schedule a follow-up appointment in six months unless your symptoms worsen before then.

## 2024-04-05 NOTE — Progress Notes (Signed)
 Subjective:    Patient ID: Amy Vang, female    DOB: August 18, 1946, 78 y.o.   MRN: [id number]  Patient Care Team: Faustina Hood, MD as PCP - General (Family Medicine) Avonne Boettcher, MD as Consulting Physician (Oncology)  Chief Complaint  Patient presents with   Follow-up    BACKGROUND/INTERVAL: Amy Vang is a 78 year old lifelong never smoker who presents for follow-up on the issue of cough, hoarseness and wheezing that occurs 2-3 times per year.  She has moderate persistent asthma and chronic rhinosinusitis.  She was last seen 03 December 2023 she is maintained on Arnuity Ellipta  200, 1 puff daily and albuterol  as needed.  Previously had issues with chronic cough resolved with the Arnuity.   HPI Discussed the use of AI scribe software for clinical note transcription with the patient, who gave verbal consent to proceed.  History of Present Illness   Amy Vang is a 78 year old female who presents with chronic cough.  Since with her husband today.  She has experienced increased coughing over the last two to three days, which she attributes to spending more time outdoors. She describes a sensation of a 'bubble' in her throat, prompting frequent throat clearing, which may be further irritating her throat.  Her asthma is currently well-controlled with Arnuity, which was recently increased from 100 to 200 micrograms, resulting in improved symptoms. She rarely uses albuterol , only during extra exertion such as walking to the street and back.  She has not had any fevers, chills or sweats.  No chest pain.  No gastroesophageal reflux symptoms.  No lower extremity edema.  No orthopnea or paroxysmal nocturnal dyspnea.  Overall, she feels well and looks well.    Review of Systems A 10 point review of systems was performed and it is as noted above otherwise negative.   Patient Active Problem List   Diagnosis Date Noted   Ductal carcinoma in situ (DCIS) of right breast  03/15/2023   Abnormal vaginal bleeding 12/17/2021   Allergic rhinitis 12/17/2021   Adjustment disorder 12/17/2021   Cholelithiasis without obstruction 12/17/2021   Constipation 12/17/2021   Degeneration of lumbar intervertebral disc 12/17/2021   Diverticulosis of sigmoid colon 12/17/2021   Fatty liver 12/17/2021   Functional abdominal pain syndrome 12/17/2021   Gastro-esophageal reflux disease without esophagitis 12/17/2021   Hypo-osmolality and hyponatremia 12/17/2021   Internal hemorrhoids 12/17/2021   Irritable bowel syndrome without diarrhea 12/17/2021   Lymphocytosis 12/17/2021   Nausea and vomiting 12/17/2021   Overactive bladder 12/17/2021   History of colonic polyps 12/17/2021   Personal history of transient ischemic attack (TIA), and cerebral infarction without residual deficits 12/17/2021   Insomnia 12/17/2021   Pure hypercholesterolemia 12/17/2021   Restless legs syndrome 12/17/2021   Rosacea 12/17/2021   Sleep-wake schedule disorder, 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   Piriformis syndrome of right side 01/01/2021   Lower limb pain, inferior, right 09/19/2020   Lumbosacral radiculitis 09/19/2020   Body mass index (BMI) 25.0-25.9, adult 06/17/2020   Multiple thyroid  nodules 03/11/2020   Annular tear of lumbar disc 01/24/2020   Acute back pain with sciatica 12/28/2019   Encounter for orthopedic follow-up care 01/17/2019   Partial thickness rotator cuff tear 11/07/2018   Acquired trigger finger 10/28/2018   Pain in joint of right shoulder 05/06/2018   Hoarseness 12/22/2017   Cervical spondylosis with radiculopathy 10/25/2017   Neck pain 08/31/2017   Other cervical disc displacement, unspecified cervical region 08/31/2017   Sinusitis, chronic 11/18/2016  Deviated septum 09/18/2016   Dyspnea and respiratory abnormality 05/21/2016   Wheeze 05/21/2016   Chronic throat clearing 05/21/2016   Pulmonary HTN (HCC) 04/23/2016   Moderate persistent asthma 11/12/2015   Allergic  rhinoconjunctivitis 11/12/2015   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   SOB (shortness of breath) 03/06/2015   OSA (obstructive sleep apnea) 01/01/2014   Benign essential HTN 01/01/2014   Obesity 01/01/2014    Social History   Tobacco Use   Smoking status: Never   Smokeless tobacco: Never  Substance Use Topics   Alcohol use: No    Allergies  Allergen Reactions   Nsaids Other (See Comments)    Abdominal Pain   Ambien  [Zolpidem ] Other (See Comments)    groggy   Cefuroxime     Other Reaction(s): doesn't work   Actuary Other (See Comments) and Swelling   Hydroxyzine Hcl Other (See Comments) and Rash    UNSPECIFIED REACTION   Lactose Intolerance (Gi) Other (See Comments)    bloating   Septra [Sulfamethoxazole-Trimethoprim] Rash    Current Meds  Medication Sig   acetaminophen  (TYLENOL ) 650 MG CR tablet Take 650 mg by mouth every 8 (eight) hours as needed for pain.   albuterol  (VENTOLIN  HFA) 108 (90 Base) MCG/ACT inhaler Inhale 2 puffs into the lungs every 6 (six) hours as needed for wheezing or shortness of breath.   citalopram  (CELEXA ) 10 MG tablet Take 5 mg by mouth daily.   clonazePAM (KLONOPIN) 0.5 MG tablet Take 0.5 mg by mouth at bedtime.   cyclobenzaprine  (FLEXERIL ) 10 MG tablet Take 1 tablet (10 mg total) by mouth 3 (three) times daily as needed for muscle spasms. (Patient taking differently: Take 10 mg by mouth at bedtime as needed for muscle spasms.)   dicyclomine  (BENTYL ) 10 MG capsule TAKE 1 CAPSULE (10 MG TOTAL) BY MOUTH 4 (FOUR) TIMES DAILY AS NEEDED FOR SPASMS.   famotidine  (PEPCID ) 40 MG tablet SMARTSIG:1.0 Tablet(s) By Mouth Daily   fexofenadine (ALLEGRA) 180 MG tablet Take 180 mg by mouth in the morning.   fluticasone  (FLONASE ) 50 MCG/ACT nasal spray Place 1 spray into both nostrils daily as needed for allergies or rhinitis.   Fluticasone  Furoate (ARNUITY ELLIPTA ) 200 MCG/ACT AEPB Inhale 1 puff into the lungs daily.   HYDROcodone -acetaminophen   (NORCO/VICODIN) 5-325 MG tablet Take 1 tablet by mouth every 4 (four) hours as needed.   ipratropium (ATROVENT ) 0.03 % nasal spray Place 2 sprays into both nostrils every 12 (twelve) hours. (Patient taking differently: Place 2 sprays into both nostrils every 12 (twelve) hours as needed.)   losartan  (COZAAR ) 50 MG tablet Take 1 tablet (50 mg total) by mouth in the morning.   methocarbamol (ROBAXIN) 500 MG tablet Take 500 mg by mouth as needed for muscle spasms.   ondansetron  (ZOFRAN ) 4 MG tablet TAKE 1 TABLET (4 MG TOTAL) BY MOUTH AS NEEDED FOR NAUSEA OR VOMITING.   pantoprazole  (PROTONIX ) 40 MG tablet TAKE 1 TABLET BY MOUTH TWICE A DAY   promethazine  (PHENERGAN ) 25 MG tablet TAKE 1 TABLET BY MOUTH EVERY 6 HOURS AS NEEDED.   sodium chloride  (OCEAN) 0.65 % SOLN nasal spray Place 1 spray into both nostrils 3 (three) times daily as needed for congestion.   tamoxifen  (NOLVADEX ) 10 MG tablet Take 1 tablet (10 mg total) by mouth daily.   traMADol  (ULTRAM ) 50 MG tablet Take 1 tablet (50 mg total) by mouth every 6 (six) hours as needed for moderate pain or severe pain.   traZODone  (DESYREL ) 50 MG tablet Take 50  mg by mouth at bedtime.    Immunization History  Administered Date(s) Administered   Fluad Quad(high Dose 65+) 08/30/2022, 08/01/2023   Fluzone Influenza virus vaccine,trivalent (IIV3), split virus 09/17/2010, 08/11/2019, 01/29/2021   Influenza Split 09/03/2009, 09/03/2012, 08/22/2015   Influenza, High Dose Seasonal PF 09/05/2015, 09/01/2016, 09/06/2017, 10/11/2018, 08/18/2019   Influenza,inj,Quad PF,6+ Mos 08/30/2016   Influenza-Unspecified 08/26/2017   PFIZER(Purple Top)SARS-COV-2 Vaccination 12/23/2019, 01/13/2020, 08/27/2020, 05/06/2021   Pneumococcal Conjugate-13 12/13/2013, 09/01/2016   Pneumococcal Polysaccharide-23 09/28/2011, 09/01/2016   Tdap 03/07/2007, 09/08/2018   Zoster Recombinant(Shingrix) 11/09/2017   Zoster, Live 03/28/2008, 08/20/2017, 11/09/2017        Objective:      BP 128/76 (BP Location: Left Arm, Patient Position: Sitting, Cuff Size: Normal)   Pulse 64   Temp 97.8 F (36.6 C) (Temporal)   Ht 5' 4.5" (1.638 m)   Wt 171 lb 12.8 oz (77.9 kg)   SpO2 95%   BMI 29.03 kg/m   SpO2: 95 %  GENERAL: Well-developed, somewhat overweight woman, no acute distress, fully ambulatory.  No conversational dyspnea. HEAD: Normocephalic, atraumatic.  EYES: Pupils equal, round, reactive to light.  No scleral icterus.  MOUTH: Teeth intact, oral mucosa moist.  No thrush. NECK: Supple. No thyromegaly. Trachea midline. No JVD.  No adenopathy. PULMONARY: Good air entry bilaterally.  No adventitious sounds. CARDIOVASCULAR: S1 and S2. Regular rate and rhythm.  No rubs, murmurs or gallops heard. ABDOMEN: Benign. MUSCULOSKELETAL: No joint deformity, no clubbing, no edema.  NEUROLOGIC: No overt focal deficit, no gait disturbance, speech is fluent. SKIN: Intact,warm,dry. PSYCH: Mood and behavior normal  Lab Results  Component Value Date   NITRICOXIDE 26 04/05/2024  *Trend:13>>22>>26 *No significant elevation of type II inflammation      Assessment & Plan:     ICD-10-CM   1. Moderate persistent asthma without complication  J45.40 Nitric oxide     2. Laryngopharyngeal reflux (LPR)  K21.9     3. Chronic cough  R05.3       Orders Placed This Encounter  Procedures   Nitric oxide    Discussion:    Asthma/cough variant Chronic asthma with recent mild exacerbation likely due to increased outdoor activity. Symptoms include chronic cough and throat irritation. Currently managed with Arnuity 200 mcg, showing improvement. Rare use of albuterol  for exertional symptoms. Inflammation levels are within normal range, indicating well-controlled asthma. - Continue Arnuity 200 mcg inhaler. - Advise use of throat lozenges or sips of water  to reduce throat clearing and irritation. - Schedule follow-up in six months unless symptoms worsen.     Advised if symptoms do not  improve or worsen, to please contact office for sooner follow up or seek emergency care.    I spent 30 minutes of dedicated to the care of this patient on the date of this encounter to include pre-visit review of records, face-to-face time with the patient discussing conditions above, post visit ordering of testing, clinical documentation with the electronic health record, making appropriate referrals as documented, and communicating necessary findings to members of the patients care team.     C. Chloe Counter, MD Advanced Bronchoscopy P[facility]    *This note was generated using voice recognition software/Dragon and/or AI transcription program.  Despite best efforts to proofread, errors can occur which can change the meaning. Any transcriptional errors that result from this process are unintentional and may not be fully corrected at the time of dictation.

## 2024-04-08 ENCOUNTER — Ambulatory Visit
Admission: RE | Admit: 2024-04-08 | Discharge: 2024-04-08 | Disposition: A | Discharge status: Home / Self Care | Source: Ambulatory Visit | Attending: Interventional Radiology | Admitting: Interventional Radiology

## 2024-04-08 DIAGNOSIS — R531 Weakness: Secondary | ICD-10-CM | POA: Diagnosis not present

## 2024-04-08 DIAGNOSIS — I771 Stricture of artery: Secondary | ICD-10-CM | POA: Diagnosis not present

## 2024-04-08 DIAGNOSIS — H93A9 Pulsatile tinnitus, unspecified ear: Secondary | ICD-10-CM | POA: Diagnosis not present

## 2024-04-08 DIAGNOSIS — H93A2 Pulsatile tinnitus, left ear: Secondary | ICD-10-CM

## 2024-04-08 DIAGNOSIS — H9312 Tinnitus, left ear: Secondary | ICD-10-CM | POA: Diagnosis not present

## 2024-04-10 ENCOUNTER — Encounter: Payer: Self-pay | Admitting: Pulmonary Disease

## 2024-04-10 ENCOUNTER — Encounter: Payer: Self-pay | Admitting: Internal Medicine

## 2024-04-11 ENCOUNTER — Encounter (HOSPITAL_COMMUNITY): Payer: Self-pay | Admitting: Interventional Radiology

## 2024-04-11 ENCOUNTER — Other Ambulatory Visit (HOSPITAL_COMMUNITY): Payer: Self-pay | Admitting: Interventional Radiology

## 2024-04-11 DIAGNOSIS — H93A2 Pulsatile tinnitus, left ear: Secondary | ICD-10-CM

## 2024-04-11 DIAGNOSIS — I771 Stricture of artery: Secondary | ICD-10-CM

## 2024-04-13 ENCOUNTER — Telehealth: Payer: Self-pay

## 2024-04-13 NOTE — Telephone Encounter (Signed)
     Good afternoon,   Does she want to call when she is ready to reschedule?  I will cancel for now. Just let me know.   I am not sure about the insurance and if it is an expiration.   From: [place], [name]> Sent: Thursday, Apr 13, 2024 9:33 AM To: Seward [email or web address]> Subject: postpone my mom's colon   Brock Mokry,     I'm reaching out for my mom Amy Vang 12/25/2045, she is scheduled for colon with [facility] 5/29 I think it is. My stepdad had shoulder replacement a week ago and she is concerned she will not be able to leave him for that time frame (she has a ride lined up) and especially him be her caregiver that evening. She would like to have that one canceled and will reschedule.     She was wondering since it was approved by insurance, does that approval have a time frame expiration? I told her I didn't think so but would ask.        Thanks     Demetrio Finder, BSN, RN, [employer], Corporate investment banker. [employer] [facility]  [phone] office  [phone] cell

## 2024-04-17 ENCOUNTER — Other Ambulatory Visit (HOSPITAL_COMMUNITY): Payer: Self-pay | Admitting: Interventional Radiology

## 2024-04-17 ENCOUNTER — Telehealth (HOSPITAL_COMMUNITY): Payer: Self-pay

## 2024-04-17 DIAGNOSIS — I771 Stricture of artery: Secondary | ICD-10-CM

## 2024-04-17 DIAGNOSIS — H93A2 Pulsatile tinnitus, left ear: Secondary | ICD-10-CM

## 2024-04-17 NOTE — Telephone Encounter (Signed)
 Called to schedule consult discuss recent imaging, no answer, left vm. AB

## 2024-04-18 ENCOUNTER — Telehealth (HOSPITAL_COMMUNITY): Payer: Self-pay

## 2024-04-18 NOTE — Telephone Encounter (Signed)
-----   Message from Marilu Shown sent at 04/17/2024  3:56 PM EDT ----- Regarding: MRV results Hi!  Dr. Alvira Josephs reviewed this patient's MRV and noted no acute concerns. He would like her to come in and see him to discuss her tinnitus/review MRV - he said this is already scheduled but I don't see it in her chart soooo probably Dev making things up again ;)  Thanks, Cathleen Coach

## 2024-04-19 ENCOUNTER — Encounter: Admitting: Internal Medicine

## 2024-04-25 DIAGNOSIS — I10 Essential (primary) hypertension: Secondary | ICD-10-CM | POA: Diagnosis not present

## 2024-04-26 ENCOUNTER — Ambulatory Visit (HOSPITAL_COMMUNITY)
Admission: RE | Admit: 2024-04-26 | Discharge: 2024-04-26 | Disposition: A | Discharge status: Home / Self Care | Source: Ambulatory Visit | Attending: Interventional Radiology | Admitting: Interventional Radiology

## 2024-04-26 DIAGNOSIS — I771 Stricture of artery: Secondary | ICD-10-CM

## 2024-04-26 DIAGNOSIS — H93A2 Pulsatile tinnitus, left ear: Secondary | ICD-10-CM

## 2024-04-27 ENCOUNTER — Encounter: Admitting: Internal Medicine

## 2024-04-27 ENCOUNTER — Other Ambulatory Visit (HOSPITAL_COMMUNITY): Payer: Self-pay | Admitting: Interventional Radiology

## 2024-04-27 DIAGNOSIS — I771 Stricture of artery: Secondary | ICD-10-CM

## 2024-04-29 DIAGNOSIS — J4540 Moderate persistent asthma, uncomplicated: Secondary | ICD-10-CM | POA: Diagnosis not present

## 2024-04-29 DIAGNOSIS — I10 Essential (primary) hypertension: Secondary | ICD-10-CM | POA: Diagnosis not present

## 2024-04-29 DIAGNOSIS — D0511 Intraductal carcinoma in situ of right breast: Secondary | ICD-10-CM | POA: Diagnosis not present

## 2024-04-29 DIAGNOSIS — E7800 Pure hypercholesterolemia, unspecified: Secondary | ICD-10-CM | POA: Diagnosis not present

## 2024-05-02 DIAGNOSIS — G4733 Obstructive sleep apnea (adult) (pediatric): Secondary | ICD-10-CM | POA: Diagnosis not present

## 2024-05-02 NOTE — H&P (Signed)
 Chief Complaint: Cerebral stenosis; pulsatile tinnitus - image guided diagnostic cerebral angiogram   Referring Provider(s): Faustina Hood   Supervising Physician: Luellen Sages  Patient Status: [facility] - Out-pt  History of Present Illness: Amy Vang is a 78 y.o. 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  Pt is followed by Dr. Felipe Horton of neurology who obtained an MR angiogram 03/28/24 revealing the stenosis, pt is also with pulsatile tinnitus and  headaches.  Pt met with Dr. Alvira Josephs in consultation on 03/28/24 where a plan was made for the workup of pulsatile tinnitis.  An MRI brain was obtained 04/08/24 revealing no acute intracranial findings and mild chronic microvascular ischemic changes.  Pt met with Dr. Alvira Josephs 04/26/24 to discuss the results and subsequently scheduled an image guided diagnostic cerebral angiogram for 05/04/24.    *** Patient is Full Code  Past Medical History:  Diagnosis Date   Allergic rhinitis    Allergy    seasonal   Anemia    Anxiety    Breast cancer (HCC) 2024   RIGHT   Cataract    removed bilateraly   Chronic constipation    COVID-19 08/2019   Degenerative disc disease, cervical    Essential hypertension    GERD (gastroesophageal reflux disease) 1957   H/O bronchitis    History of echocardiogram    in epic 04-21-2019,  normal w/ ef 60-65% and mild mitral regurg, no stenosis or restriction   History of TIAs    per pt age 89;  2008;  05/ 2013;  pt stated no residual was from sleep doctor more than likely sleep deprived than actual tia   Motion sickness    cars   Nocturia    OSA on CPAP    Severe ,, CPAP 12 MM H2O- Dr Micael Adas   PONV (postoperative nausea and vomiting)    Restless leg syndrome    Rosacea    Seasonal asthma    no inhaler   Sleep apnea    uses CPAP   Stroke (HCC) 1985   TIAx2    Past Surgical History:   Procedure Laterality Date   ANTERIOR CERVICAL DECOMP/DISCECTOMY FUSION N/A 10/25/2017   Procedure: CERVICAL SIX- CERVICAL SEVEN ANTERIOR CERVICAL DECOMPRESSION/DISCECTOMY FUSION, INTERBODY PROSTHESIS AND ANTERIOR PLATING;  Surgeon: Garry Kansas, MD;  Location: [facility] OR;  Service: Neurosurgery;  Laterality: N/A;  CERVICAL 6- CERVICAL 7 ANTERIOR CERVICAL DECOMPRESSION/DISCECTOMY FUSION, INTERBODY PROSTHESIS AND ANTERIOR PLATING   BACK SURGERY  04/2022   BILATERAL BENIGN BREAST BX'S  20 YRS AGO   BREAST BIOPSY Right    benign   BREAST BIOPSY Right 01/27/2023   MM RT BREAST BX W LOC DEV 1ST LESION IMAGE BX SPEC STEREO GUIDE 01/27/2023 [facility]   BREAST BIOPSY Right 02/05/2023   MM RT BREAST BX W LOC DEV 1ST LESION IMAGE BX SPEC STEREO GUIDE 02/05/2023 [facility]   BREAST BIOPSY  03/09/2023   MM RT RADIOACTIVE SEED LOC MAMMO GUIDE 03/09/2023 [facility]   BREAST BIOPSY  03/09/2023   MM RT RADIOACTIVE SEED EA ADD LESION LOC MAMMO GUIDE 03/09/2023 [facility]   BREAST EXCISIONAL BIOPSY Bilateral over 10 years ago   benign   BREAST LUMPECTOMY WITH RADIOACTIVE SEED LOCALIZATION Right 03/10/2023   Procedure: RIGHT BREAST LUMPECTOMY WITH RADIOACTIVE SEED LOCALIZATION;  Surgeon: Enid Harry, MD;  Location: [facility];  Service: General;  Laterality: Right;   CARDIAC CATHETERIZATION N/A 08/13/2016  Procedure: Right Heart Cath;  Surgeon: Darlis Eisenmenger, MD;  Location: [facility] INVASIVE CV LAB;  Service: Cardiovascular;  Laterality: N/A;    normal study, no evidence pulm HTN   CARPAL TUNNEL RELEASE  2009   BILATERAL   CATARACT EXTRACTION W/PHACO Right 02/27/2020   Procedure: CATARACT EXTRACTION PHACO AND INTRAOCULAR LENS PLACEMENT (IOC) RIGHT 3.60 00:23.9;  Surgeon: Clair Crews, MD;  Location: [facility];  Service: Ophthalmology;  Laterality: Right;  sleep apnea   CATARACT EXTRACTION W/PHACO Left 05/21/2020   Procedure: CATARACT EXTRACTION PHACO AND INTRAOCULAR  LENS PLACEMENT (IOC) LEFT 3.69 00:30.9;  Surgeon: Clair Crews, MD;  Location: [facility];  Service: Ophthalmology;  Laterality: Left;  sleep apnea   CHOLECYSTECTOMY N/A 03/25/2021   Procedure: LAPAROSCOPIC CHOLECYSTECTOMY;  Surgeon: Enid Harry, MD;  Location: [facility] OR;  Service: General;  Laterality: N/A;   CYSTOCELE REPAIR  12/11/2011   Procedure: ANTERIOR REPAIR (CYSTOCELE);  Surgeon: Mark C Ottelin, MD;  Location: [facility];  Service: Urology;  Laterality: N/A;  1 1/2 hour requested for case  Anterior repair and mid Urethral Sling   INCISIONAL HERNIA REPAIR N/A 12/24/2021   Procedure: LAP ASSISTED INCISIONAL HERNIA REPAIR WITH MESH;  Surgeon: Enid Harry, MD;  Location: [facility] OR;  Service: General;  Laterality: N/A;  GEN & TAP BLOCK RNFA   IR RADIOLOGIST EVAL & MGMT  03/30/2024   MASS EXCISION N/A 07/24/2019   Procedure: EXCISION OF VAGINAL FOREIGN BODY;  Surgeon: Ottelin, Mark, MD;  Location: [facility];  Service: Urology;  Laterality: N/A;   NASAL SEPTOPLASTY W/ TURBINOPLASTY Bilateral 11/18/2016   Procedure: NASAL SEPTOPLASTY WITH BILATERAL TURBINATE REDUCTION;  Surgeon: Ammon Bales, MD;  Location: [facility] OR;  Service: ENT;  Laterality: Bilateral;   NONINVASIVE VASCULAR CAROTID STUDY  09/14/2007   BILATERAL MILD MIX PLAQUE THROUGHOUT, NO SIGNIFICANT BILATERAL ICA STENOSIS   PUBOVAGINAL SLING  12/11/2011   Procedure: Gino Lais;  Surgeon: Alanson Alliance, MD;  Location: [facility];  Service: Urology;  Laterality: N/A;   PULLEY RELEASE RIGHT THUMB  2009   RADIOACTIVE SEED GUIDED EXCISIONAL BREAST BIOPSY Right 03/10/2023   Procedure: RADIOACTIVE SEED GUIDED EXCISIONAL RIGHT BREAST BIOPSY;  Surgeon: Enid Harry, MD;  Location: [facility] OR;  Service: General;  Laterality: Right;   RE-EXCISION OF BREAST LUMPECTOMY Right 03/30/2023   Procedure: RE-EXCISION OF RIGHT BREAST LUMPECTOMY;  Surgeon: Enid Harry,  MD;  Location: [facility];  Service: General;  Laterality: Right;   SHOULDER ARTHROSCOPY DISTAL CLAVICLE EXCISION AND OPEN ROTATOR CUFF REPAIR Right 01-17-2019   dr supple @[facility]    SINUS ENDO WITH FUSION Bilateral 11/18/2016   Procedure: BILATERAL ENDOSCOPIC SINUS SURGERY;  Surgeon: Ammon Bales, MD;  Location: [facility] OR;  Service: ENT;  Laterality: Bilateral;   UMBILICAL HERNIA REPAIR N/A 03/25/2021   Procedure: Primary REPAIR UMBILICAL HERNIA;  Surgeon: Enid Harry, MD;  Location: [facility] OR;  Service: General;  Laterality: N/A;   UPPER GASTROINTESTINAL ENDOSCOPY     VAGINAL HYSTERECTOMY  AGE 61    Allergies: Nsaids, Ambien  [zolpidem ], Cefuroxime, Sulfamethoxazole-trimethoprim, Hydroxyzine hcl, Lactose intolerance (gi), and Septra [sulfamethoxazole-trimethoprim]  Medications: Prior to Admission medications   Medication Sig Start Date End Date Taking? Authorizing Provider  acetaminophen  (TYLENOL ) 650 MG CR tablet Take 650 mg by mouth every 8 (eight) hours as needed for pain.    [provider]  albuterol  (VENTOLIN  HFA) 108 (90 Base) MCG/ACT inhaler Inhale 2 puffs into the lungs every 6 (six) hours as needed  for wheezing or shortness of breath. 07/13/23   Marc Senior, MD  citalopram  (CELEXA ) 10 MG tablet Take 5 mg by mouth daily.    [provider]  clonazePAM (KLONOPIN) 0.5 MG tablet Take 0.5 mg by mouth at bedtime.    [provider]  cyclobenzaprine  (FLEXERIL ) 10 MG tablet Take 1 tablet (10 mg total) by mouth 3 (three) times daily as needed for muscle spasms. Patient taking differently: Take 10 mg by mouth at bedtime as needed for muscle spasms. 10/25/17   Garry Kansas, MD  dicyclomine  (BENTYL ) 10 MG capsule TAKE 1 CAPSULE (10 MG TOTAL) BY MOUTH 4 (FOUR) TIMES DAILY AS NEEDED FOR SPASMS. 11/17/23   Kenney Peacemaker, MD  famotidine  (PEPCID ) 40 MG tablet SMARTSIG:1.0 Tablet(s) By Mouth Daily    [provider]  fexofenadine (ALLEGRA)  180 MG tablet Take 180 mg by mouth in the morning.    [provider]  fluticasone  (FLONASE ) 50 MCG/ACT nasal spray Place 1 spray into both nostrils daily as needed for allergies or rhinitis.    [provider]  Fluticasone  Furoate (ARNUITY ELLIPTA ) 200 MCG/ACT AEPB Inhale 1 puff into the lungs daily. 01/10/24   Marc Senior, MD  HYDROcodone -acetaminophen  (NORCO/VICODIN) 5-325 MG tablet Take 1 tablet by mouth every 4 (four) hours as needed. 10/06/23   [provider]  ipratropium (ATROVENT ) 0.03 % nasal spray Place 2 sprays into both nostrils every 12 (twelve) hours. Patient taking differently: Place 2 sprays into both nostrils every 12 (twelve) hours as needed. 12/14/23   Marc Senior, MD  losartan  (COZAAR ) 50 MG tablet Take 1 tablet (50 mg total) by mouth in the morning. 11/01/23   Gollan, Timothy J, MD  methocarbamol (ROBAXIN) 500 MG tablet Take 500 mg by mouth as needed for muscle spasms.    [provider]  ondansetron  (ZOFRAN ) 4 MG tablet TAKE 1 TABLET (4 MG TOTAL) BY MOUTH AS NEEDED FOR NAUSEA OR VOMITING. 09/22/23   Kenney Peacemaker, MD  pantoprazole  (PROTONIX ) 40 MG tablet TAKE 1 TABLET BY MOUTH TWICE A DAY 10/20/23   Kenney Peacemaker, MD  promethazine  (PHENERGAN ) 25 MG tablet TAKE 1 TABLET BY MOUTH EVERY 6 HOURS AS NEEDED. 09/22/23   Kenney Peacemaker, MD  sodium chloride  (OCEAN) 0.65 % SOLN nasal spray Place 1 spray into both nostrils 3 (three) times daily as needed for congestion.    [provider]  tamoxifen  (NOLVADEX ) 10 MG tablet Take 1 tablet (10 mg total) by mouth daily. 02/18/24   Nelda Balsam, NP  traMADol  (ULTRAM ) 50 MG tablet Take 1 tablet (50 mg total) by mouth every 6 (six) hours as needed for moderate pain or severe pain. 03/30/23   Enid Harry, MD  traZODone  (DESYREL ) 50 MG tablet Take 50 mg by mouth at bedtime.    [provider]     Family History  Adopted: Yes  Problem Relation Age of Onset   Diabetes  Mother    Stroke Mother    Alzheimer's disease Mother    Hypertension Sister    Diabetes Sister    Heart attack Brother    Leukemia Brother    CAD Brother    Diabetes Brother    Colon cancer Maternal Aunt    Stroke Paternal Grandmother    Heart attack Paternal Grandfather    Esophageal cancer Neg Hx    Rectal cancer Neg Hx    Stomach cancer Neg Hx    Colon polyps Neg Hx  Social History   Socioeconomic History   Marital status: Married    Spouse name: Not on file   Number of children: 2   Years of education: Not on file   Highest education level: Not on file  Occupational History   Not on file  Tobacco Use   Smoking status: Never   Smokeless tobacco: Never  Vaping Use   Vaping status: Never Used  Substance and Sexual Activity   Alcohol use: No   Drug use: No   Sexual activity: Not on file  Other Topics Concern   Not on file  Social History Narrative   Lives with husband.    Retired Environmental health practitioner   Has 2 children.   Social Drivers of Corporate investment banker Strain: Not on file  Food Insecurity: No Food Insecurity (03/16/2023)   Hunger Vital Sign    Worried About Running Out of Food in the Last Year: Never true    Ran Out of Food in the Last Year: Never true  Transportation Needs: No Transportation Needs (03/16/2023)   PRAPARE - Administrator, Civil Service (Medical): No    Lack of Transportation (Non-Medical): No  Physical Activity: Not on file  Stress: Not on file  Social Connections: Not on file     Review of Systems: A 12 point ROS discussed and pertinent positives are indicated in the HPI above.  All other systems are negative.  Review of Systems  Vital Signs: There were no vitals taken for this visit.  Advance Care Plan: The advanced care place/surrogate decision maker was discussed at the time of visit and the patient did not wish to discuss or was not able to name a surrogate decision maker or provide an advance care  plan.  Physical Exam  Imaging: MR MRV HEAD WO CM Result Date: 04/15/2024 CLINICAL DATA:  78 year old female with left side pulsatile tinnitus, left side headache persistent for 6 months. EXAM: MR VENOGRAM HEAD WITHOUT CONTRAST TECHNIQUE: Angiographic images of the intracranial venous structures were acquired using MRV technique without intravenous contrast. COMPARISON:  Brain MRI without contrast the same day reported separately, intracranial MRA 03/18/2024. FINDINGS: Noncontrast 2D and 3D time-of-flight MRV images. Preserved flow signal in the superior sagittal sinus, torcula, straight sinus, vein of Galen, internal cerebral veins, basal veins of Rosenthal, bilateral transverse sinuses, bilateral sigmoid sinuses, bilateral IJ bulbs. Preserved flow signal in the major draining cortical veins including Trolard and Labbe. No evidence of intracranial mass effect or ventriculomegaly. IMPRESSION: Normal intracranial MRV, negative for venous sinus thrombosis. Electronically Signed   By: Marlise Simpers M.D.   On: 04/15/2024 11:44   MR BRAIN WO CONTRAST Result Date: 04/11/2024 EXAMINATION: MR BRAIN WO CONTRAST HISTORY: Pulsatile tinnitus TECHNIQUE: MRI of the brain performed without IV contrast. COMPARISON: 06/26/2016 FINDINGS: No evidence for intracranial mass, hemorrhage, or acute infarct. There are mild bilateral periventricular and subcortical white matter T2 hyperintensities, which are nonspecific, but most likely secondary to chronic microvascular ischemia. The ventricles are symmetric and the basilar cisterns are patent. Major intracranial flow voids appear preserved. The paranasal sinuses and mastoid air cells appear well aerated. The orbits are normal. IMPRESSION: No acute intracranial findings. Mild chronic microvascular ischemic changes. Electronically signed by: [place] Engel MD 04/11/2024 07:22 AM EDT RP Workstation: [id number]    Labs:  CBC: Recent Labs    01/06/24 1455  WBC 6.4  HGB 12.3  HCT 36.6   PLT 179    COAGS: No results for input(s): "  INR", "APTT" in the last 8760 hours.  BMP: Recent Labs    08/20/23 1000  NA 135  K 3.7  CL 102  CO2 28  GLUCOSE 117*  BUN 15  CALCIUM  8.5*  CREATININE 0.76  GFRNONAA >60    LIVER FUNCTION TESTS: Recent Labs    08/20/23 1000  BILITOT 0.7  AST 18  ALT 15  ALKPHOS 49  PROT 6.8  ALBUMIN 4.1    TUMOR MARKERS: No results for input(s): "AFPTM", "CEA", "CA199", "CHROMGRNA" in the last 8760 hours.  Assessment and Plan:  Pt is with cerebral stenosis and pulsatile tinnitus scheduled for image guided diagnostic cerebral angiogram 05/04/24.    Risks and benefits of image guided diagnostic cerebral angiogram were discussed with the patient including, but not limited to bleeding, infection, vascular injury or contrast induced renal failure.  This interventional procedure involves the use of X-rays and because of the nature of the planned procedure, it is possible that we will have prolonged use of X-ray fluoroscopy.  Potential radiation risks to you include (but are not limited to) the following: - A slightly elevated risk for cancer  several years later in life. This risk is typically less than 0.5% percent. This risk is low in comparison to the normal incidence of human cancer, which is 33% for women and 50% for men according to the American Cancer Society. - Radiation induced injury can include skin redness, resembling a rash, tissue breakdown / ulcers and hair loss (which can be temporary or permanent).   The likelihood of either of these occurring depends on the difficulty of the procedure and whether you are sensitive to radiation due to previous procedures, disease, or genetic conditions.   IF your procedure requires a prolonged use of radiation, you will be notified and given written instructions for further action.  It is your responsibility to monitor the irradiated area for the 2 weeks following the procedure and to notify  your physician if you are concerned that you have suffered a radiation induced injury.    All of the patient's questions were answered, patient is agreeable to proceed.  Consent signed and in chart.  Thank you for allowing our service to participate in Amy Vang 's care.  Electronically Signed: Pasty Bongo, PA-C   05/02/2024, 1:47 PM    I spent a total of    15 Minutes in face to face in clinical consultation, greater than 50% of which was counseling/coordinating care for image guided diagnostic cerebral angiogram.

## 2024-05-03 ENCOUNTER — Other Ambulatory Visit: Payer: Self-pay | Admitting: Interventional Radiology

## 2024-05-03 DIAGNOSIS — F419 Anxiety disorder, unspecified: Secondary | ICD-10-CM | POA: Diagnosis not present

## 2024-05-03 DIAGNOSIS — Z01818 Encounter for other preprocedural examination: Secondary | ICD-10-CM

## 2024-05-03 DIAGNOSIS — F5101 Primary insomnia: Secondary | ICD-10-CM | POA: Diagnosis not present

## 2024-05-03 DIAGNOSIS — J4540 Moderate persistent asthma, uncomplicated: Secondary | ICD-10-CM | POA: Diagnosis not present

## 2024-05-03 DIAGNOSIS — G4733 Obstructive sleep apnea (adult) (pediatric): Secondary | ICD-10-CM | POA: Diagnosis not present

## 2024-05-03 DIAGNOSIS — I272 Pulmonary hypertension, unspecified: Secondary | ICD-10-CM | POA: Diagnosis not present

## 2024-05-03 DIAGNOSIS — I10 Essential (primary) hypertension: Secondary | ICD-10-CM | POA: Diagnosis not present

## 2024-05-03 DIAGNOSIS — D0511 Intraductal carcinoma in situ of right breast: Secondary | ICD-10-CM | POA: Diagnosis not present

## 2024-05-03 DIAGNOSIS — K219 Gastro-esophageal reflux disease without esophagitis: Secondary | ICD-10-CM | POA: Diagnosis not present

## 2024-05-03 DIAGNOSIS — E7800 Pure hypercholesterolemia, unspecified: Secondary | ICD-10-CM | POA: Diagnosis not present

## 2024-05-03 DIAGNOSIS — R8290 Unspecified abnormal findings in urine: Secondary | ICD-10-CM | POA: Diagnosis not present

## 2024-05-03 DIAGNOSIS — G2581 Restless legs syndrome: Secondary | ICD-10-CM | POA: Diagnosis not present

## 2024-05-03 DIAGNOSIS — K760 Fatty (change of) liver, not elsewhere classified: Secondary | ICD-10-CM | POA: Diagnosis not present

## 2024-05-04 ENCOUNTER — Encounter (HOSPITAL_COMMUNITY): Payer: Self-pay

## 2024-05-04 ENCOUNTER — Other Ambulatory Visit (HOSPITAL_COMMUNITY): Payer: Self-pay | Admitting: Interventional Radiology

## 2024-05-04 ENCOUNTER — Ambulatory Visit (HOSPITAL_COMMUNITY)
Admission: RE | Admit: 2024-05-04 | Discharge: 2024-05-04 | Disposition: A | Discharge status: Home / Self Care | Source: Ambulatory Visit | Attending: Interventional Radiology | Admitting: Interventional Radiology

## 2024-05-04 ENCOUNTER — Other Ambulatory Visit: Payer: Self-pay

## 2024-05-04 DIAGNOSIS — I771 Stricture of artery: Secondary | ICD-10-CM

## 2024-05-04 DIAGNOSIS — I671 Cerebral aneurysm, nonruptured: Secondary | ICD-10-CM | POA: Diagnosis not present

## 2024-05-04 DIAGNOSIS — M4802 Spinal stenosis, cervical region: Secondary | ICD-10-CM | POA: Diagnosis not present

## 2024-05-04 DIAGNOSIS — M542 Cervicalgia: Secondary | ICD-10-CM | POA: Diagnosis not present

## 2024-05-04 DIAGNOSIS — H93A2 Pulsatile tinnitus, left ear: Secondary | ICD-10-CM | POA: Insufficient documentation

## 2024-05-04 DIAGNOSIS — Z01818 Encounter for other preprocedural examination: Secondary | ICD-10-CM

## 2024-05-04 DIAGNOSIS — I669 Occlusion and stenosis of unspecified cerebral artery: Secondary | ICD-10-CM | POA: Diagnosis not present

## 2024-05-04 DIAGNOSIS — I6522 Occlusion and stenosis of left carotid artery: Secondary | ICD-10-CM | POA: Diagnosis not present

## 2024-05-04 HISTORY — PX: IR 3D INDEPENDENT WKST: IMG2385

## 2024-05-04 HISTORY — PX: IR ANGIO INTRA EXTRACRAN SEL COM CAROTID INNOMINATE BILAT MOD SED: IMG5360

## 2024-05-04 HISTORY — PX: IR ANGIO VERTEBRAL SEL VERTEBRAL BILAT MOD SED: IMG5369

## 2024-05-04 LAB — CBC
HCT: 37.6 % (ref 36.0–46.0)
Hemoglobin: 12.7 g/dL (ref 12.0–15.0)
MCH: 30.8 pg (ref 26.0–34.0)
MCHC: 33.8 g/dL (ref 30.0–36.0)
MCV: 91.3 fL (ref 80.0–100.0)
Platelets: 181 10*3/uL (ref 150–400)
RBC: 4.12 MIL/uL (ref 3.87–5.11)
RDW: 12.5 % (ref 11.5–15.5)
WBC: 9.8 10*3/uL (ref 4.0–10.5)
nRBC: 0 % (ref 0.0–0.2)

## 2024-05-04 LAB — PROTIME-INR
INR: 0.9 (ref 0.8–1.2)
Prothrombin Time: 12.5 s (ref 11.4–15.2)

## 2024-05-04 LAB — BASIC METABOLIC PANEL WITH GFR
Anion gap: 7 (ref 5–15)
BUN: 7 mg/dL — ABNORMAL LOW (ref 8–23)
CO2: 28 mmol/L (ref 22–32)
Calcium: 8.7 mg/dL — ABNORMAL LOW (ref 8.9–10.3)
Chloride: 104 mmol/L (ref 98–111)
Creatinine, Ser: 0.6 mg/dL (ref 0.44–1.00)
GFR, Estimated: >60 mL/min (ref >60)
Glucose, Bld: 97 mg/dL (ref 70–99)
Potassium: 3.5 mmol/L (ref 3.5–5.1)
Sodium: 139 mmol/L (ref 135–145)

## 2024-05-04 MED ORDER — MIDAZOLAM HCL 2 MG/2ML IJ SOLN
INTRAMUSCULAR | Status: AC
  Filled 2024-05-04: qty 2

## 2024-05-04 MED ORDER — SODIUM CHLORIDE 0.9 % IV SOLN: INTRAVENOUS | Status: AC

## 2024-05-04 MED ORDER — LIDOCAINE HCL 1 % IJ SOLN
20.0000 mL | Freq: Once | INTRAMUSCULAR | Status: AC
  Administered 2024-05-04: 10 mL via INTRADERMAL

## 2024-05-04 MED ORDER — HEPARIN SODIUM (PORCINE) 1000 UNIT/ML IJ SOLN
INTRAMUSCULAR | Status: AC | PRN
  Administered 2024-05-04: 1000 [IU] via INTRAVENOUS

## 2024-05-04 MED ORDER — FENTANYL CITRATE (PF) 100 MCG/2ML IJ SOLN
INTRAMUSCULAR | Status: AC | PRN
  Administered 2024-05-04: 25 ug via INTRAVENOUS

## 2024-05-04 MED ORDER — IOHEXOL 300 MG/ML  SOLN
150.0000 mL | Freq: Once | INTRAMUSCULAR | Status: AC | PRN
  Administered 2024-05-04: 126 mL via INTRA_ARTERIAL

## 2024-05-04 MED ORDER — IOHEXOL 300 MG/ML  SOLN
100.0000 mL | Freq: Once | INTRAMUSCULAR | Status: AC | PRN
Start: 2024-05-04 — End: 2024-05-04
  Administered 2024-05-04: 17 mL via INTRA_ARTERIAL

## 2024-05-04 MED ORDER — LIDOCAINE HCL 1 % IJ SOLN
INTRAMUSCULAR | Status: AC
  Filled 2024-05-04: qty 20

## 2024-05-04 MED ORDER — MIDAZOLAM HCL 2 MG/2ML IJ SOLN
INTRAMUSCULAR | Status: AC | PRN
  Administered 2024-05-04: 1 mg via INTRAVENOUS

## 2024-05-04 MED ORDER — HEPARIN SODIUM (PORCINE) 1000 UNIT/ML IJ SOLN
INTRAMUSCULAR | Status: AC
  Filled 2024-05-04: qty 10

## 2024-05-04 MED ORDER — FENTANYL CITRATE (PF) 100 MCG/2ML IJ SOLN
INTRAMUSCULAR | Status: AC
  Filled 2024-05-04: qty 2

## 2024-05-04 NOTE — Progress Notes (Signed)
 Up and walked and tolerated well; right groin stable, no bleeding or hematoma; right groin pad removed and redressed with 2x2 and hypafix

## 2024-05-04 NOTE — Procedures (Signed)
 INR.  Status post four-vessel cerebral arteriogram.  Right CFA approach.  Findings.  1.  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  Associated mild fusiform dilatation noted.  2.  High riding jugular bulbs.  3.  Mild stenosis of the left transverse sinus distally.  No acute complications.   Patient tolerated the  procedure well.    Jory Ng MD.

## 2024-05-04 NOTE — Sedation Documentation (Signed)
 Daughter at bedside with pt to discuss findings of scan with Dr. Alvira Josephs.

## 2024-05-09 ENCOUNTER — Other Ambulatory Visit (HOSPITAL_COMMUNITY): Payer: Self-pay | Admitting: Interventional Radiology

## 2024-05-09 ENCOUNTER — Telehealth (HOSPITAL_COMMUNITY): Payer: Self-pay | Admitting: Student

## 2024-05-09 DIAGNOSIS — I771 Stricture of artery: Secondary | ICD-10-CM

## 2024-05-09 MED ORDER — TICAGRELOR 90 MG PO TABS: 90.0000 mg | ORAL_TABLET | Freq: Two times a day (BID) | ORAL | 2 refills | Status: DC

## 2024-05-09 NOTE — Telephone Encounter (Signed)
 Per Dr. Alvira Josephs Brilinta 90 mg BID e-prescribed to patient's pharmacy.  Jetta Morrow, AGACNP-BC 05/09/2024, 10:33 AM

## 2024-05-12 ENCOUNTER — Encounter: Payer: Self-pay | Admitting: Pulmonary Disease

## 2024-05-12 NOTE — Telephone Encounter (Signed)
 She could try switching to Zyrtec over-the-counter.  Take it at bedtime.  If her symptoms persist however make an appointment for follow-up and reevaluation.

## 2024-05-16 DIAGNOSIS — G4733 Obstructive sleep apnea (adult) (pediatric): Secondary | ICD-10-CM | POA: Diagnosis not present

## 2024-05-17 ENCOUNTER — Encounter (HOSPITAL_COMMUNITY): Payer: Self-pay

## 2024-05-17 ENCOUNTER — Inpatient Hospital Stay (HOSPITAL_COMMUNITY)

## 2024-05-17 ENCOUNTER — Telehealth (HOSPITAL_COMMUNITY): Payer: Self-pay | Admitting: Interventional Radiology

## 2024-05-17 NOTE — Telephone Encounter (Signed)
 Called pt to change her procedure date/time with Dr. Alvira Josephs from 6/25 to 7/2. Left VM for her to call me back. JM

## 2024-05-24 ENCOUNTER — Inpatient Hospital Stay (HOSPITAL_COMMUNITY)

## 2024-05-24 DIAGNOSIS — I10 Essential (primary) hypertension: Secondary | ICD-10-CM | POA: Diagnosis not present

## 2024-05-26 DIAGNOSIS — G4733 Obstructive sleep apnea (adult) (pediatric): Secondary | ICD-10-CM | POA: Diagnosis not present

## 2024-05-27 ENCOUNTER — Encounter (HOSPITAL_COMMUNITY): Payer: Self-pay | Admitting: Interventional Radiology

## 2024-05-28 ENCOUNTER — Other Ambulatory Visit: Payer: Self-pay | Admitting: Pulmonary Disease

## 2024-05-28 DIAGNOSIS — J4540 Moderate persistent asthma, uncomplicated: Secondary | ICD-10-CM

## 2024-05-28 DIAGNOSIS — R053 Chronic cough: Secondary | ICD-10-CM

## 2024-05-29 ENCOUNTER — Other Ambulatory Visit (HOSPITAL_COMMUNITY)

## 2024-05-29 ENCOUNTER — Encounter (HOSPITAL_COMMUNITY): Payer: Self-pay | Admitting: Interventional Radiology

## 2024-05-29 ENCOUNTER — Other Ambulatory Visit: Payer: Self-pay

## 2024-05-29 NOTE — Progress Notes (Signed)
 PCP - Dr Coolidge Sharps Cardiologist - Dr Timothy Gollan (Last OV 11/01/23)  Chest x-ray - n/a EKG - 11/01/23 Stress Test - 04/30/16 ECHO - 04/21/19 Cardiac Cath - 08/13/16  ICD Pacemaker/Loop - n/a  Sleep Study -  Yes (03/2012) CPAP - uses CPAP nightly  Diabetes - n/a  Blood Thinner Instructions:  Continue Brilinta , take on DOS  Aspirin  Instructions: n/a  NPO  Anesthesia review: Yes  STOP now taking any Aspirin  (unless otherwise instructed by your surgeon), Aleve, Naproxen, Ibuprofen, Motrin, Advil, Goody's, BC's, all herbal medications, fish oil, and all vitamins.   Coronavirus Screening Do you have any of the following symptoms:  Cough yes/no: No Fever (>100.65F)  yes/no: No Runny nose yes/no: No Sore throat yes/no: No Difficulty breathing/shortness of breath  yes/no: No  Have you traveled in the last 14 days and where? yes/no: No  Patient verbalized understanding of instructions that were given via phone.

## 2024-05-30 ENCOUNTER — Other Ambulatory Visit: Payer: Self-pay | Admitting: Radiology

## 2024-05-30 DIAGNOSIS — I6522 Occlusion and stenosis of left carotid artery: Secondary | ICD-10-CM

## 2024-05-30 NOTE — Progress Notes (Signed)
 Anesthesia Chart Review: Same day workup  78 year old female with pertinent history including never smoker, post-operative N/V, HTN, GERD on PPI, hiatal hernia, anemia, OSA on CPAP, seasonal asthma, TIA? (2008 versus sleep deprivation), spinal surgery (C6-7 10/25/17), left breast cancer s/p lumpectomy 03/2023.     She had prior work-up for chronic dyspnea (most studies done in 2017) including CTA chest showing no PE or ILD, normal PFTs except decreased DLCO, non-ischemic Cardiolite, near normal CPX primarily limited by body habitus, normal RHC (with pulmonary HTN by previous echo), and most recently a 2020 echo showed normal LVEF, impaired LV relaxation, normal RVSF, mild MI/MR. She is using CPAP for OSA.  Last seen by cardiologist Dr. Gollan on 11/01/2023.  At that time blood pressure noted to be elevated, losartan  was increased to 50 mg daily.  She was otherwise recommended to continue risk factor reduction.  Patient recently IR for complaint of left-sided pulsatile tinnitus and neck pain.  Angiography showed focal area of transverse radiolucency of the mid left cervical ICA at the level of C2, associated with significant stenosis, and a moderate size fusiform dilatation suggestive of fibromuscular dysplasia.  She is on Brilinta  90 mg twice daily at the direction of Dr. Dolphus.   BMP and CBC 05/04/2024 reviewed, unremarkable.   EKG 11/01/23: Normal sinus rhythm.  Rate 64. Poor R wave progression. Nonspecific T wave abnormality   Echo 04/21/19: Sonographer Comments: Suboptimal subcostal window.  IMPRESSIONS   1. The left ventricle has normal systolic function with an ejection  fraction of 60-65%. The cavity size was normal. There is mildly increased  left ventricular wall thickness. Left ventricular diastolic Doppler  parameters are consistent with impaired  relaxation.   2. The right ventricle has normal systolic function. The cavity was  normal. There is no increase in right ventricular wall  thickness.Unable to  estimate RVSP   3. Aortic valve regurgitation is mild by color flow Doppler.   4. Mild mitral valve regurgitation    RHC 08/13/16: Normal study.  No evidence for pulmonary hypertension and normal filling pressures.     CPX 05/26/16: Conclusion: Exercise testing with gas-exchange demonstrates  normal functional capacity when compared to matched sedentary  norms. There is no evidence of cardiopulmonary limitation or  exercise-induced bronchospasm. Patient appears primarily limited  due to her body habitus. There was also chronotropic  incompetence.    Nuclear stress test 04/30/16: Nuclear stress EF: 72%. There was no ST segment deviation noted during stress. This is a low risk study. The left ventricular ejection fraction is hyperdynamic (>65%). Breast artifact no ischemia or infarction EF normal 72% no RWMAls     Amy Vang Amy Vang Phone [phone] 05/30/2024 10:00 AM

## 2024-05-30 NOTE — Anesthesia Preprocedure Evaluation (Signed)
 Anesthesia Evaluation  Patient identified by MRN, date of birth, ID band Patient awake    Reviewed: Allergy & Precautions, NPO status , Patient's Chart, lab work & pertinent test results  History of Anesthesia Complications (+) PONV and history of anesthetic complications  Airway Mallampati: III  TM Distance: >3 FB Neck ROM: Full    Dental  (+) Teeth Intact, Dental Advisory Given   Pulmonary asthma , sleep apnea    breath sounds clear to auscultation       Cardiovascular hypertension,  Rhythm:Regular Rate:Normal     Neuro/Psych  Headaches PSYCHIATRIC DISORDERS Anxiety      Neuromuscular disease CVA    GI/Hepatic Neg liver ROS,GERD  Medicated,,  Endo/Other  negative endocrine ROS    Renal/GU negative Renal ROS     Musculoskeletal  (+) Arthritis ,    Abdominal   Peds  Hematology  (+) Blood dyscrasia, anemia   Anesthesia Other Findings   Reproductive/Obstetrics                              Anesthesia Physical Anesthesia Plan  ASA: 3  Anesthesia Plan: General   Post-op Pain Management: Tylenol  PO (pre-op)*   Induction: Intravenous  PONV Risk Score and Plan: 4 or greater and Ondansetron , Dexamethasone  and Treatment may vary due to age or medical condition  Airway Management Planned: Oral ETT  Additional Equipment: Arterial line  Intra-op Plan:   Post-operative Plan: Extubation in OR  Informed Consent: I have reviewed the patients History and Physical, chart, labs and discussed the procedure including the risks, benefits and alternatives for the proposed anesthesia with the patient or authorized representative who has indicated his/her understanding and acceptance.     Dental advisory given  Plan Discussed with: CRNA  Anesthesia Plan Comments: (PAT note by Lynwood Hope, PA-C: 78 year old female with pertinent history including never smoker, post-operative N/V, HTN, GERD on  PPI, hiatal hernia, anemia, OSA on CPAP, seasonal asthma, TIA? (2008 versus sleep deprivation), spinal surgery (C6-7 10/25/17), left breast cancer s/p lumpectomy 03/2023.    She had prior work-up for chronic dyspnea (most studies done in 2017) including CTA chest showing no PE or ILD, normal PFTs except decreased DLCO, non-ischemic Cardiolite, near normal CPX primarily limited by body habitus, normal RHC (with pulmonary HTN by previous echo), and most recently a 2020 echo showed normal LVEF, impaired LV relaxation, normal RVSF, mild MI/MR. She is using CPAP for OSA.  Last seen by cardiologist Dr. Gollan on 11/01/2023.  At that time blood pressure noted to be elevated, losartan  was increased to 50 mg daily.  She was otherwise recommended to continue risk factor reduction.  Patient recently IR for complaint of left-sided pulsatile tinnitus and neck pain.  Angiography showed focal area of transverse radiolucency of the mid left cervical ICA at the level of C2, associated with significant stenosis, and a moderate size fusiform dilatation suggestive of fibromuscular dysplasia.  She is on Brilinta  90 mg twice daily at the direction of Dr. Dolphus.  BMP and CBC 05/04/2024 reviewed, unremarkable.  EKG 11/01/23: Normal sinus rhythm.  Rate 64. Poor R wave progression. Nonspecific T wave abnormality  Echo 04/21/19: Sonographer Comments: Suboptimal subcostal window.  IMPRESSIONS  1. The left ventricle has normal systolic function with an ejection  fraction of 60-65%. The cavity size was normal. There is mildly increased  left ventricular wall thickness. Left ventricular diastolic Doppler  parameters are consistent with impaired  relaxation.  2. The right ventricle has normal systolic function. The cavity was  normal. There is no increase in right ventricular wall thickness.Unable to  estimate RVSP  3. Aortic valve regurgitation is mild by color flow Doppler.  4. Mild mitral valve regurgitation   RHC  08/13/16: Normal study. No evidence for pulmonary hypertension and normal filling pressures.   CPX 05/26/16: Conclusion: Exercise testing with gas-exchange demonstrates  normal functional capacity when compared to matched sedentary  norms. There is no evidence of cardiopulmonary limitation or  exercise-induced bronchospasm. Patient appears primarily limited  due to her body habitus. There was also chronotropic  incompetence.   Nuclear stress test 04/30/16:  Nuclear stress EF: 72%.  There was no ST segment deviation noted during stress.  This is a low risk study.  The left ventricular ejection fraction is hyperdynamic (>65%). Breast artifact no ischemia or infarction EF normal 72% no RWMAls    )         Anesthesia Quick Evaluation

## 2024-05-31 ENCOUNTER — Inpatient Hospital Stay (HOSPITAL_COMMUNITY): Payer: Self-pay | Admitting: Physician Assistant

## 2024-05-31 ENCOUNTER — Other Ambulatory Visit: Payer: Self-pay

## 2024-05-31 ENCOUNTER — Encounter (HOSPITAL_COMMUNITY)
Admission: RE | Disposition: A | Discharge status: Home / Self Care | Payer: Self-pay | Source: Home / Self Care | Attending: Interventional Radiology

## 2024-05-31 ENCOUNTER — Encounter (HOSPITAL_COMMUNITY): Payer: Self-pay | Admitting: Interventional Radiology

## 2024-05-31 ENCOUNTER — Inpatient Hospital Stay (HOSPITAL_COMMUNITY)
Admission: RE | Admit: 2024-05-31 | Discharge: 2024-05-31 | Disposition: A | Discharge status: Home / Self Care | Source: Ambulatory Visit | Attending: Interventional Radiology | Admitting: Interventional Radiology

## 2024-05-31 ENCOUNTER — Other Ambulatory Visit: Payer: Self-pay | Admitting: Internal Medicine

## 2024-05-31 ENCOUNTER — Encounter (HOSPITAL_COMMUNITY): Payer: Self-pay | Admitting: Physician Assistant

## 2024-05-31 ENCOUNTER — Inpatient Hospital Stay (HOSPITAL_COMMUNITY)
Admission: RE | Admit: 2024-05-31 | Discharge: 2024-06-01 | DRG: 027 | Disposition: A | Discharge status: Home / Self Care | Attending: Interventional Radiology | Admitting: Interventional Radiology

## 2024-05-31 DIAGNOSIS — G2581 Restless legs syndrome: Secondary | ICD-10-CM | POA: Diagnosis not present

## 2024-05-31 DIAGNOSIS — Z800 Family history of malignant neoplasm of digestive organs: Secondary | ICD-10-CM

## 2024-05-31 DIAGNOSIS — K219 Gastro-esophageal reflux disease without esophagitis: Secondary | ICD-10-CM | POA: Diagnosis present

## 2024-05-31 DIAGNOSIS — Z7902 Long term (current) use of antithrombotics/antiplatelets: Secondary | ICD-10-CM | POA: Diagnosis not present

## 2024-05-31 DIAGNOSIS — I771 Stricture of artery: Secondary | ICD-10-CM

## 2024-05-31 DIAGNOSIS — Z833 Family history of diabetes mellitus: Secondary | ICD-10-CM

## 2024-05-31 DIAGNOSIS — J45909 Unspecified asthma, uncomplicated: Secondary | ICD-10-CM | POA: Diagnosis not present

## 2024-05-31 DIAGNOSIS — Z823 Family history of stroke: Secondary | ICD-10-CM | POA: Diagnosis not present

## 2024-05-31 DIAGNOSIS — Z8249 Family history of ischemic heart disease and other diseases of the circulatory system: Secondary | ICD-10-CM | POA: Diagnosis not present

## 2024-05-31 DIAGNOSIS — Z8673 Personal history of transient ischemic attack (TIA), and cerebral infarction without residual deficits: Secondary | ICD-10-CM

## 2024-05-31 DIAGNOSIS — Z853 Personal history of malignant neoplasm of breast: Secondary | ICD-10-CM

## 2024-05-31 DIAGNOSIS — Z886 Allergy status to analgesic agent status: Secondary | ICD-10-CM

## 2024-05-31 DIAGNOSIS — Z981 Arthrodesis status: Secondary | ICD-10-CM | POA: Diagnosis not present

## 2024-05-31 DIAGNOSIS — I6522 Occlusion and stenosis of left carotid artery: Secondary | ICD-10-CM | POA: Diagnosis not present

## 2024-05-31 DIAGNOSIS — Z806 Family history of leukemia: Secondary | ICD-10-CM

## 2024-05-31 DIAGNOSIS — Z888 Allergy status to other drugs, medicaments and biological substances status: Secondary | ICD-10-CM | POA: Diagnosis not present

## 2024-05-31 DIAGNOSIS — H93A2 Pulsatile tinnitus, left ear: Secondary | ICD-10-CM | POA: Diagnosis not present

## 2024-05-31 DIAGNOSIS — Z79899 Other long term (current) drug therapy: Secondary | ICD-10-CM | POA: Diagnosis not present

## 2024-05-31 DIAGNOSIS — F419 Anxiety disorder, unspecified: Secondary | ICD-10-CM | POA: Diagnosis present

## 2024-05-31 DIAGNOSIS — I10 Essential (primary) hypertension: Secondary | ICD-10-CM | POA: Diagnosis present

## 2024-05-31 DIAGNOSIS — E739 Lactose intolerance, unspecified: Secondary | ICD-10-CM | POA: Diagnosis present

## 2024-05-31 DIAGNOSIS — Z820 Family history of epilepsy and other diseases of the nervous system: Secondary | ICD-10-CM

## 2024-05-31 DIAGNOSIS — G4733 Obstructive sleep apnea (adult) (pediatric): Secondary | ICD-10-CM | POA: Diagnosis not present

## 2024-05-31 DIAGNOSIS — Z8616 Personal history of COVID-19: Secondary | ICD-10-CM

## 2024-05-31 HISTORY — PX: RADIOLOGY WITH ANESTHESIA: SHX6223

## 2024-05-31 HISTORY — DX: Unspecified osteoarthritis, unspecified site: M19.90

## 2024-05-31 HISTORY — PX: IR ANGIO INTRA EXTRACRAN SEL INTERNAL CAROTID UNI L MOD SED: IMG5361

## 2024-05-31 HISTORY — PX: IR TRANSCATH/EMBOLIZ: IMG695

## 2024-05-31 HISTORY — PX: IR CT HEAD LTD: IMG2386

## 2024-05-31 HISTORY — DX: Headache, unspecified: R51.9

## 2024-05-31 LAB — CBC WITH DIFFERENTIAL/PLATELET
Abs Immature Granulocytes: 0.02 10*3/uL (ref 0.00–0.07)
Basophils Absolute: 0 10*3/uL (ref 0.0–0.1)
Basophils Relative: 1 %
Eosinophils Absolute: 0.4 10*3/uL (ref 0.0–0.5)
Eosinophils Relative: 6 %
HCT: 36.7 % (ref 36.0–46.0)
Hemoglobin: 12.3 g/dL (ref 12.0–15.0)
Immature Granulocytes: 0 %
Lymphocytes Relative: 43 %
Lymphs Abs: 2.8 10*3/uL (ref 0.7–4.0)
MCH: 30.9 pg (ref 26.0–34.0)
MCHC: 33.5 g/dL (ref 30.0–36.0)
MCV: 92.2 fL (ref 80.0–100.0)
Monocytes Absolute: 0.6 10*3/uL (ref 0.1–1.0)
Monocytes Relative: 10 %
Neutro Abs: 2.5 10*3/uL (ref 1.7–7.7)
Neutrophils Relative %: 40 %
Platelets: 187 10*3/uL (ref 150–400)
RBC: 3.98 MIL/uL (ref 3.87–5.11)
RDW: 12.8 % (ref 11.5–15.5)
WBC: 6.4 10*3/uL (ref 4.0–10.5)
nRBC: 0 % (ref 0.0–0.2)

## 2024-05-31 LAB — BASIC METABOLIC PANEL WITH GFR
Anion gap: 10 (ref 5–15)
BUN: 9 mg/dL (ref 8–23)
CO2: 25 mmol/L (ref 22–32)
Calcium: 9.1 mg/dL (ref 8.9–10.3)
Chloride: 103 mmol/L (ref 98–111)
Creatinine, Ser: 0.71 mg/dL (ref 0.44–1.00)
GFR, Estimated: >60 mL/min (ref >60)
Glucose, Bld: 94 mg/dL (ref 70–99)
Potassium: 4 mmol/L (ref 3.5–5.1)
Sodium: 138 mmol/L (ref 135–145)

## 2024-05-31 LAB — TYPE AND SCREEN
ABO/RH(D): O POS
Antibody Screen: NEGATIVE

## 2024-05-31 LAB — POCT ACTIVATED CLOTTING TIME
Activated Clotting Time: 193 s
Activated Clotting Time: 199 s

## 2024-05-31 LAB — HEPARIN LEVEL (UNFRACTIONATED): Heparin Unfractionated: 0.12 [IU]/mL — ABNORMAL LOW (ref 0.30–0.70)

## 2024-05-31 LAB — PROTIME-INR
INR: 0.9 (ref 0.8–1.2)
Prothrombin Time: 12.9 s (ref 11.4–15.2)

## 2024-05-31 LAB — APTT: aPTT: 26 s (ref 24–36)

## 2024-05-31 SURGERY — RADIOLOGY WITH ANESTHESIA
Anesthesia: General

## 2024-05-31 MED ORDER — CHLORHEXIDINE GLUCONATE 0.12 % MT SOLN
15.0000 mL | Freq: Once | OROMUCOSAL | Status: AC
  Administered 2024-05-31: 15 mL via OROMUCOSAL
  Filled 2024-05-31: qty 15

## 2024-05-31 MED ORDER — NITROGLYCERIN 1 MG/10 ML FOR IR/CATH LAB
INTRA_ARTERIAL | Status: AC
  Filled 2024-05-31: qty 10

## 2024-05-31 MED ORDER — HEPARIN (PORCINE) 25000 UT/250ML-% IV SOLN
500.0000 [IU]/h | INTRAVENOUS | Status: DC
  Administered 2024-05-31: 500 [IU]/h via INTRAVENOUS
  Filled 2024-05-31: qty 250

## 2024-05-31 MED ORDER — IOHEXOL 300 MG/ML  SOLN
150.0000 mL | Freq: Once | INTRAMUSCULAR | Status: AC | PRN
  Administered 2024-05-31: 80 mL via INTRA_ARTERIAL

## 2024-05-31 MED ORDER — ASPIRIN 81 MG PO CHEW
81.0000 mg | CHEWABLE_TABLET | Freq: Every day | ORAL | Status: DC
  Administered 2024-06-01: 81 mg via ORAL
  Filled 2024-05-31: qty 1

## 2024-05-31 MED ORDER — CEFAZOLIN SODIUM-DEXTROSE 2-4 GM/100ML-% IV SOLN
2.0000 g | INTRAVENOUS | Status: AC
  Administered 2024-05-31: 2 g via INTRAVENOUS
  Filled 2024-05-31: qty 100

## 2024-05-31 MED ORDER — ORAL CARE MOUTH RINSE: 15.0000 mL | OROMUCOSAL | Status: DC | PRN

## 2024-05-31 MED ORDER — IOHEXOL 300 MG/ML  SOLN: 150.0000 mL | Freq: Once | INTRAMUSCULAR | Status: DC | PRN

## 2024-05-31 MED ORDER — PHENYLEPHRINE HCL-NACL 20-0.9 MG/250ML-% IV SOLN
INTRAVENOUS | Status: DC | PRN
Start: 2024-05-31 — End: 2024-05-31
  Administered 2024-05-31: 20 ug/min via INTRAVENOUS

## 2024-05-31 MED ORDER — CLEVIDIPINE BUTYRATE 0.5 MG/ML IV EMUL: 0.0000 mg/h | INTRAVENOUS | Status: DC

## 2024-05-31 MED ORDER — PROPOFOL 10 MG/ML IV BOLUS
INTRAVENOUS | Status: DC | PRN
  Administered 2024-05-31: 150 mg via INTRAVENOUS
  Administered 2024-05-31: 50 mg via INTRAVENOUS

## 2024-05-31 MED ORDER — ACETAMINOPHEN 160 MG/5ML PO SOLN: 650.0000 mg | ORAL | Status: DC | PRN

## 2024-05-31 MED ORDER — TICAGRELOR 90 MG PO TABS: 90.0000 mg | ORAL_TABLET | Freq: Two times a day (BID) | ORAL | Status: DC

## 2024-05-31 MED ORDER — DEXAMETHASONE SODIUM PHOSPHATE 10 MG/ML IJ SOLN
INTRAMUSCULAR | Status: DC | PRN
  Administered 2024-05-31: 10 mg via INTRAVENOUS

## 2024-05-31 MED ORDER — LIDOCAINE 2% (20 MG/ML) 5 ML SYRINGE
INTRAMUSCULAR | Status: DC | PRN
  Administered 2024-05-31: 100 mg via INTRAVENOUS

## 2024-05-31 MED ORDER — SUGAMMADEX SODIUM 200 MG/2ML IV SOLN
INTRAVENOUS | Status: DC | PRN
  Administered 2024-05-31: 200 mg via INTRAVENOUS

## 2024-05-31 MED ORDER — ACETAMINOPHEN 650 MG RE SUPP: 650.0000 mg | RECTAL | Status: DC | PRN

## 2024-05-31 MED ORDER — LIDOCAINE HCL 1 % IJ SOLN
20.0000 mL | Freq: Once | INTRAMUSCULAR | Status: AC
  Administered 2024-05-31: 10 mL

## 2024-05-31 MED ORDER — ONDANSETRON HCL 4 MG/2ML IJ SOLN
INTRAMUSCULAR | Status: DC | PRN
  Administered 2024-05-31: 4 mg via INTRAVENOUS

## 2024-05-31 MED ORDER — ASPIRIN 81 MG PO CHEW
81.0000 mg | CHEWABLE_TABLET | Freq: Once | ORAL | Status: AC
Start: 2024-05-31 — End: 2024-05-31
  Administered 2024-05-31: 81 mg via ORAL

## 2024-05-31 MED ORDER — FENTANYL CITRATE (PF) 250 MCG/5ML IJ SOLN
INTRAMUSCULAR | Status: DC | PRN
  Administered 2024-05-31 (×2): 50 ug via INTRAVENOUS

## 2024-05-31 MED ORDER — TICAGRELOR 90 MG PO TABS
90.0000 mg | ORAL_TABLET | Freq: Two times a day (BID) | ORAL | Status: DC
  Administered 2024-05-31 – 2024-06-01 (×2): 90 mg via ORAL
  Filled 2024-05-31 (×2): qty 1

## 2024-05-31 MED ORDER — FENTANYL CITRATE (PF) 100 MCG/2ML IJ SOLN
INTRAMUSCULAR | Status: AC
  Filled 2024-05-31: qty 2

## 2024-05-31 MED ORDER — HEPARIN (PORCINE) 25000 UT/250ML-% IV SOLN
650.0000 [IU]/h | INTRAVENOUS | Status: AC
  Administered 2024-05-31: 650 [IU]/h via INTRAVENOUS

## 2024-05-31 MED ORDER — EPTIFIBATIDE 20 MG/10ML IV SOLN
INTRAVENOUS | Status: AC
  Filled 2024-05-31: qty 10

## 2024-05-31 MED ORDER — HEPARIN SODIUM (PORCINE) 1000 UNIT/ML IJ SOLN
INTRAMUSCULAR | Status: DC | PRN
  Administered 2024-05-31: 3000 [IU] via INTRAVENOUS

## 2024-05-31 MED ORDER — NIMODIPINE 30 MG PO CAPS: 0.0000 mg | ORAL_CAPSULE | ORAL | Status: DC

## 2024-05-31 MED ORDER — EPTIFIBATIDE 20 MG/10ML IV SOLN
INTRAVENOUS | Status: AC | PRN
  Administered 2024-05-31 (×2): 1.5 mg via INTRA_ARTERIAL

## 2024-05-31 MED ORDER — LACTATED RINGERS IV SOLN: INTRAVENOUS | Status: DC

## 2024-05-31 MED ORDER — HEPARIN (PORCINE) 25000 UT/250ML-% IV SOLN
INTRAVENOUS | Status: AC
  Filled 2024-05-31: qty 250

## 2024-05-31 MED ORDER — IOHEXOL 300 MG/ML  SOLN
150.0000 mL | Freq: Once | INTRAMUSCULAR | Status: AC | PRN
  Administered 2024-05-31: 10 mL via INTRA_ARTERIAL

## 2024-05-31 MED ORDER — CHLORHEXIDINE GLUCONATE CLOTH 2 % EX PADS
6.0000 | MEDICATED_PAD | Freq: Every day | CUTANEOUS | Status: DC
  Administered 2024-05-31 – 2024-06-01 (×2): 6 via TOPICAL

## 2024-05-31 MED ORDER — LIDOCAINE HCL 1 % IJ SOLN
INTRAMUSCULAR | Status: AC
  Filled 2024-05-31: qty 20

## 2024-05-31 MED ORDER — ORAL CARE MOUTH RINSE: 15.0000 mL | Freq: Once | OROMUCOSAL | Status: AC

## 2024-05-31 MED ORDER — EPHEDRINE SULFATE (PRESSORS) 50 MG/ML IJ SOLN
INTRAMUSCULAR | Status: DC | PRN
  Administered 2024-05-31: 5 mg via INTRAVENOUS

## 2024-05-31 MED ORDER — AMISULPRIDE (ANTIEMETIC) 5 MG/2ML IV SOLN
10.0000 mg | Freq: Once | INTRAVENOUS | Status: AC | PRN
  Administered 2024-05-31: 10 mg via INTRAVENOUS

## 2024-05-31 MED ORDER — ROCURONIUM BROMIDE 10 MG/ML (PF) SYRINGE
PREFILLED_SYRINGE | INTRAVENOUS | Status: DC | PRN
  Administered 2024-05-31: 50 mg via INTRAVENOUS

## 2024-05-31 MED ORDER — LACTATED RINGERS IV SOLN: INTRAVENOUS | Status: DC | PRN

## 2024-05-31 MED ORDER — ASPIRIN 81 MG PO CHEW: 81.0000 mg | CHEWABLE_TABLET | Freq: Every day | ORAL | Status: DC

## 2024-05-31 MED ORDER — ACETAMINOPHEN 325 MG PO TABS
650.0000 mg | ORAL_TABLET | ORAL | Status: DC | PRN
  Administered 2024-05-31: 650 mg via ORAL
  Filled 2024-05-31 (×2): qty 2

## 2024-05-31 MED ORDER — SODIUM CHLORIDE 0.9 % IV SOLN: INTRAVENOUS | Status: DC

## 2024-05-31 MED ORDER — FENTANYL CITRATE (PF) 100 MCG/2ML IJ SOLN
25.0000 ug | INTRAMUSCULAR | Status: DC | PRN
  Administered 2024-05-31: 50 ug via INTRAVENOUS

## 2024-05-31 MED ORDER — LIDOCAINE HCL 1 % IJ SOLN
20.0000 mL | Freq: Once | INTRAMUSCULAR | Status: AC
  Administered 2024-05-31: 20 mL

## 2024-05-31 MED ORDER — AMISULPRIDE (ANTIEMETIC) 5 MG/2ML IV SOLN
INTRAVENOUS | Status: AC
  Filled 2024-05-31: qty 4

## 2024-05-31 MED ORDER — PROPOFOL 500 MG/50ML IV EMUL
INTRAVENOUS | Status: DC | PRN
  Administered 2024-05-31: 50 ug/kg/min via INTRAVENOUS

## 2024-05-31 NOTE — Progress Notes (Signed)
 PHARMACY - ANTICOAGULATION CONSULT NOTE  Pharmacy Consult for Heparin  Indication: post neuro intervention  Allergies  Allergen Reactions   Nsaids Other (See Comments)    Abdominal Pain   Ambien  [Zolpidem ] Other (See Comments)    groggy   Bactrim [Sulfamethoxazole-Trimethoprim] Swelling and Other (See Comments)   Cefuroxime     Other Reaction(s): doesn't work   Hydroxyzine Hcl Other (See Comments) and Rash    UNSPECIFIED REACTION   Lactose Intolerance (Gi) Other (See Comments)    bloating   Septra [Sulfamethoxazole-Trimethoprim] Rash    Patient Measurements: Height: 5' 4 (162.6 cm) Weight: 78 kg (172 lb) IBW/kg (Calculated) : 54.7 HEPARIN  DW (KG): 71.3  Vital Signs: Temp: 97.8 F (36.6 C) (07/02 1345) BP: 135/57 (07/02 1345) Pulse Rate: 81 (07/02 1345)  Labs: Recent Labs    05/31/24 0635  HGB 12.3  HCT 36.7  PLT 187  APTT 26  LABPROT 12.9  INR 0.9  CREATININE 0.71    Estimated Creatinine Clearance: 60.4 mL/min (by C-G formula based on SCr of 0.71 mg/dL).   Medical History: Past Medical History:  Diagnosis Date   Allergic rhinitis    Allergy    seasonal   Anemia    Anxiety    Aortic atherosclerosis (HCC) 11/01/2023   Arthritis    low back   Breast cancer (HCC) 2024   RIGHT   Cataract    removed bilateraly   Chronic constipation    COVID-19 08/2019   Degenerative disc disease, cervical    Essential hypertension    GERD (gastroesophageal reflux disease) 1957   H/O bronchitis    Headache    otc med prn tylenol    History of cerebral artery stenosis 02/2024   History of echocardiogram    in epic 04-21-2019,  normal w/ ef 60-65% and mild mitral regurg, no stenosis or restriction   History of TIAs    per pt age 34;  2008;  05/ 2013;  pt stated no residual was from sleep doctor more than likely sleep deprived than actual tia   Motion sickness    cars   Nocturia    OSA on CPAP    Severe ,, CPAP 12 MM H2O- Dr Shlomo   Palpitations 01/09/2022    hx-no current problems per pt on 05/29/24   PONV (postoperative nausea and vomiting)    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   Restless leg syndrome    Rosacea    Seasonal asthma    albuterol  inhaler prn /elliupta inhaler   Sleep apnea    uses CPAP nightly   Stroke (HCC) 1985   TIAx2    Medications:  Medications Prior to Admission  Medication Sig Dispense Refill Last Dose/Taking   acetaminophen  (TYLENOL ) 650 MG CR tablet Take 650 mg by mouth every 8 (eight) hours as needed for pain.   05/30/2024 Bedtime   cetirizine (ZYRTEC) 10 MG tablet Take 10 mg by mouth daily.   05/31/2024 at  5:00 AM   citalopram  (CELEXA ) 10 MG tablet Take 10 mg by mouth daily.   05/30/2024 Bedtime   clonazePAM (KLONOPIN) 0.5 MG tablet Take 0.5 mg by mouth at bedtime.   05/30/2024 Bedtime   famotidine  (PEPCID ) 40 MG tablet Take 40 mg by mouth at bedtime.   05/30/2024 Bedtime   fluticasone  (FLONASE ) 50 MCG/ACT nasal spray Place 1 spray into both nostrils daily as needed for allergies or rhinitis.   05/31/2024 Morning   Fluticasone  Furoate (ARNUITY ELLIPTA ) 200 MCG/ACT AEPB Inhale 1 puff into the lungs  daily. 30 each 6 05/30/2024 Evening   ipratropium (ATROVENT ) 0.03 % nasal spray Place 2 sprays into both nostrils every 12 (twelve) hours. (Patient taking differently: Place 2 sprays into both nostrils every 12 (twelve) hours as needed for rhinitis.) 30 mL 12 Taking Differently   losartan  (COZAAR ) 100 MG tablet Take 100 mg by mouth daily.   05/31/2024 at  5:00 AM   ondansetron  (ZOFRAN ) 4 MG tablet TAKE 1 TABLET (4 MG TOTAL) BY MOUTH AS NEEDED FOR NAUSEA OR VOMITING. 18 tablet 4 Past Week   pantoprazole  (PROTONIX ) 40 MG tablet TAKE 1 TABLET BY MOUTH TWICE A DAY 180 tablet 2 05/30/2024 Evening   Probiotic Product (ALIGN) 10 MG CAPS Take 1 capsule by mouth daily.   05/31/2024 at  5:00 AM   promethazine  (PHENERGAN ) 25 MG tablet TAKE 1 TABLET BY MOUTH EVERY 6 HOURS AS NEEDED. 30 tablet 3 Past Week   sodium chloride  (OCEAN) 0.65 % SOLN nasal spray Place 1  spray into both nostrils as needed for congestion.   Past Month   tamoxifen  (NOLVADEX ) 10 MG tablet Take 1 tablet (10 mg total) by mouth daily. 90 tablet 3 05/31/2024 at  5:00 AM   ticagrelor  (BRILINTA ) 90 MG TABS tablet Take 1 tablet (90 mg total) by mouth 2 (two) times daily. 60 tablet 2 05/31/2024 at  5:00 AM   traMADol  (ULTRAM ) 50 MG tablet Take 1 tablet (50 mg total) by mouth every 6 (six) hours as needed for moderate pain or severe pain. 10 tablet 0 Past Month   traZODone  (DESYREL ) 50 MG tablet Take 50 mg by mouth at bedtime.   05/30/2024 Bedtime   albuterol  (VENTOLIN  HFA) 108 (90 Base) MCG/ACT inhaler TAKE 2 PUFFS BY MOUTH EVERY 6 HOURS AS NEEDED FOR WHEEZE OR SHORTNESS OF BREATH 6.7 each 4 More than a month   cyclobenzaprine  (FLEXERIL ) 10 MG tablet Take 1 tablet (10 mg total) by mouth 3 (three) times daily as needed for muscle spasms. (Patient taking differently: Take 10 mg by mouth at bedtime as needed for muscle spasms.) 50 tablet 1 More than a month   dicyclomine  (BENTYL ) 10 MG capsule TAKE 1 CAPSULE (10 MG TOTAL) BY MOUTH 4 (FOUR) TIMES DAILY AS NEEDED FOR SPASMS. 360 capsule 0    HYDROcodone -acetaminophen  (NORCO/VICODIN) 5-325 MG tablet Take 1 tablet by mouth every 4 (four) hours as needed for severe pain (pain score 7-10).   More than a month   methocarbamol (ROBAXIN) 500 MG tablet Take 500 mg by mouth every 8 (eight) hours as needed for muscle spasms.   More than a month    Assessment: 78 y.o. F s/p cerebral angiogram with ICA stent placement. Heparin  started at 500 units/hr in PACU. Pharmacy asked to dose heparin . CBC stable. No AC PTA.  Goal of Therapy:  Heparin  level 0.1-0.25 units/ml Monitor platelets by anticoagulation protocol: Yes   Plan: Increase heparin  to 650 units/hr Will f/u 8 hr heparin  level Heparin  to be turned off 7/3 0800 for sheath removal  Vito Ralph, PharmD, BCPS Please see amion for complete clinical pharmacist phone list 05/31/2024,2:21 PM

## 2024-05-31 NOTE — Procedures (Signed)
 INR.  Status post a left common carotid arteriogram  Findings  Well defined radiolucency,web,of the  proximal left internal carotid artery.  Status post placement of a 4.75 mm x 60 mm pipeline flow diverter device with significantly improved caliber and flow through the proximal left internal carotid artery.  8 French Angio-Seal closure device deployed for hemostasis at the right groin puncture site.  Distal pulses all present unchanged.  Post CT of brain no evidence of right intracranial hemorrhage.  Patient extubated.  Denies any headaches,nausea vomiting.  Follows simple commands appropriately.  Pupils 3 mm equal, reactive sluggishly bilaterally.  No facial asymmetry.  Moves all 4s equally proportional to effort.  GORMAN Nash MD.

## 2024-05-31 NOTE — H&P (Signed)
 Chief Complaint: Patient was seen in consultation today for left ICA stenosis; pulsatile tinnitus  Referring Physician(s): Claudene Pellet   Supervising Physician: Dolphus Carrion  Patient Status: [facility] - Out-pt  History of Present Illness: Amy Vang is a 78 y.o. 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 Pt is followed by Dr. Claudene of neurology who obtained an MR angiogram 03/28/24 revealing the stenosis, pt is also with pulsatile tinnitus and headaches. Pt met with Dr. Dolphus in consultation on 03/28/24 where a plan was made for the workup of pulsatile tinnitis. An MRI brain was obtained 04/08/24 revealing no acute intracranial findings and mild chronic microvascular ischemic changes. Pt met with Dr. Dolphus 04/26/24 to discuss the results and subsequently underwent an image guided diagnostic cerebral angiogram for 05/04/24.   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  Dr. Dolphus recommended endovascular intervention with possible stenting. The patient was agreeable to this plan.   Past Medical History:  Diagnosis Date   Allergic rhinitis    Allergy    seasonal   Anemia    Anxiety    Aortic atherosclerosis (HCC) 11/01/2023   Arthritis    low back   Breast cancer (HCC) 2024   RIGHT   Cataract    removed bilateraly   Chronic constipation    COVID-19 08/2019   Degenerative disc disease, cervical    Essential hypertension    GERD (gastroesophageal reflux disease) 1957   H/O bronchitis    Headache    otc med prn tylenol    History of cerebral artery stenosis 02/2024   History of echocardiogram    in epic 04-21-2019,  normal w/ ef 60-65% and mild mitral regurg, no stenosis or restriction   History of TIAs    per pt age 66;  2008;  05/ 2013;  pt stated no residual was  from sleep doctor more than likely sleep deprived than actual tia   Motion sickness    cars   Nocturia    OSA on CPAP    Severe ,, CPAP 12 MM H2O- Dr Shlomo   Palpitations 01/09/2022   hx-no current problems per pt on 05/29/24   PONV (postoperative nausea and vomiting)    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   Restless leg syndrome    Rosacea    Seasonal asthma    albuterol  inhaler prn /elliupta inhaler   Sleep apnea    uses CPAP nightly   Stroke (HCC) 1985   TIAx2    Past Surgical History:  Procedure Laterality Date   ANTERIOR CERVICAL DECOMP/DISCECTOMY FUSION N/A 10/25/2017   Procedure: CERVICAL SIX- CERVICAL SEVEN ANTERIOR CERVICAL DECOMPRESSION/DISCECTOMY FUSION, INTERBODY PROSTHESIS AND ANTERIOR PLATING;  Surgeon: Mavis Purchase, MD;  Location: [facility] OR;  Service: Neurosurgery;  Laterality: N/A;  CERVICAL 6- CERVICAL 7 ANTERIOR CERVICAL DECOMPRESSION/DISCECTOMY FUSION, INTERBODY PROSTHESIS AND ANTERIOR PLATING   BACK SURGERY  04/2022   BILATERAL BENIGN BREAST BX'S  20 YRS AGO   BREAST BIOPSY Right    benign   BREAST BIOPSY Right 01/27/2023   MM RT BREAST BX W LOC DEV 1ST LESION IMAGE BX SPEC STEREO GUIDE 01/27/2023 [facility]   BREAST BIOPSY Right 02/05/2023   MM RT BREAST BX W LOC DEV 1ST LESION IMAGE BX SPEC STEREO GUIDE 02/05/2023 [facility]   BREAST BIOPSY  03/09/2023   MM RT RADIOACTIVE SEED LOC  MAMMO GUIDE 03/09/2023 [facility]   BREAST BIOPSY  03/09/2023   MM RT RADIOACTIVE SEED EA ADD LESION LOC MAMMO GUIDE 03/09/2023 [facility]   BREAST EXCISIONAL BIOPSY Bilateral over 10 years ago   benign   BREAST LUMPECTOMY WITH RADIOACTIVE SEED LOCALIZATION Right 03/10/2023   Procedure: RIGHT BREAST LUMPECTOMY WITH RADIOACTIVE SEED LOCALIZATION;  Surgeon: Ebbie Cough, MD;  Location: [facility] OR;  Service: General;  Laterality: Right;   CARDIAC CATHETERIZATION N/A 08/13/2016   Procedure: Right Heart Cath;  Surgeon: Ezra GORMAN Shuck, MD;  Location: [facility] INVASIVE CV  LAB;  Service: Cardiovascular;  Laterality: N/A;    normal study, no evidence pulm HTN   CARPAL TUNNEL RELEASE  2009   BILATERAL   CATARACT EXTRACTION W/PHACO Right 02/27/2020   Procedure: CATARACT EXTRACTION PHACO AND INTRAOCULAR LENS PLACEMENT (IOC) RIGHT 3.60 00:23.9;  Surgeon: Jaye Fallow, MD;  Location: [facility];  Service: Ophthalmology;  Laterality: Right;  sleep apnea   CATARACT EXTRACTION W/PHACO Left 05/21/2020   Procedure: CATARACT EXTRACTION PHACO AND INTRAOCULAR LENS PLACEMENT (IOC) LEFT 3.69 00:30.9;  Surgeon: Jaye Fallow, MD;  Location: [facility];  Service: Ophthalmology;  Laterality: Left;  sleep apnea   CHOLECYSTECTOMY N/A 03/25/2021   Procedure: LAPAROSCOPIC CHOLECYSTECTOMY;  Surgeon: Ebbie Cough, MD;  Location: [facility] OR;  Service: General;  Laterality: N/A;   CYSTOCELE REPAIR  12/11/2011   Procedure: ANTERIOR REPAIR (CYSTOCELE);  Surgeon: Mark C Ottelin, MD;  Location: [facility];  Service: Urology;  Laterality: N/A;  1 1/2 hour requested for case  Anterior repair and mid Urethral Sling   INCISIONAL HERNIA REPAIR N/A 12/24/2021   Procedure: LAP ASSISTED INCISIONAL HERNIA REPAIR WITH MESH;  Surgeon: Ebbie Cough, MD;  Location: [facility] OR;  Service: General;  Laterality: N/A;  GEN & TAP BLOCK RNFA   IR 3D INDEPENDENT WKST  05/04/2024   IR ANGIO INTRA EXTRACRAN SEL COM CAROTID INNOMINATE BILAT MOD SED  05/04/2024   IR ANGIO VERTEBRAL SEL VERTEBRAL BILAT MOD SED  05/04/2024   IR RADIOLOGIST EVAL & MGMT  03/30/2024   MASS EXCISION N/A 07/24/2019   Procedure: EXCISION OF VAGINAL FOREIGN BODY;  Surgeon: Ottelin, Mark, MD;  Location: [facility];  Service: Urology;  Laterality: N/A;   NASAL SEPTOPLASTY W/ TURBINOPLASTY Bilateral 11/18/2016   Procedure: NASAL SEPTOPLASTY WITH BILATERAL TURBINATE REDUCTION;  Surgeon: Alm Bouche, MD;  Location: [facility] OR;  Service: ENT;  Laterality: Bilateral;   NONINVASIVE VASCULAR  CAROTID STUDY  09/14/2007   BILATERAL MILD MIX PLAQUE THROUGHOUT, NO SIGNIFICANT BILATERAL ICA STENOSIS   PUBOVAGINAL SLING  12/11/2011   Procedure: CARLOYN GLADE;  Surgeon: Oneil JAYSON Rafter, MD;  Location: [facility];  Service: Urology;  Laterality: N/A;   PULLEY RELEASE RIGHT THUMB  2009   RADIOACTIVE SEED GUIDED EXCISIONAL BREAST BIOPSY Right 03/10/2023   Procedure: RADIOACTIVE SEED GUIDED EXCISIONAL RIGHT BREAST BIOPSY;  Surgeon: Ebbie Cough, MD;  Location: [facility] OR;  Service: General;  Laterality: Right;   RE-EXCISION OF BREAST LUMPECTOMY Right 03/30/2023   Procedure: RE-EXCISION OF RIGHT BREAST LUMPECTOMY;  Surgeon: Ebbie Cough, MD;  Location: [facility];  Service: General;  Laterality: Right;   SHOULDER ARTHROSCOPY DISTAL CLAVICLE EXCISION AND OPEN ROTATOR CUFF REPAIR Right 01-17-2019   dr supple @[facility]    SINUS ENDO WITH FUSION Bilateral 11/18/2016   Procedure: BILATERAL ENDOSCOPIC SINUS SURGERY;  Surgeon: Alm Bouche, MD;  Location: [facility] OR;  Service: ENT;  Laterality: Bilateral;   UMBILICAL HERNIA REPAIR N/A 03/25/2021  Procedure: Primary REPAIR UMBILICAL HERNIA;  Surgeon: Ebbie Cough, MD;  Location: [facility] OR;  Service: General;  Laterality: N/A;   UPPER GASTROINTESTINAL ENDOSCOPY     VAGINAL HYSTERECTOMY  AGE 61    Allergies: Nsaids, Ambien  [zolpidem ], Bactrim [sulfamethoxazole-trimethoprim], Cefuroxime, Hydroxyzine hcl, Lactose intolerance (gi), and Septra [sulfamethoxazole-trimethoprim]  Medications: Prior to Admission medications   Medication Sig Start Date End Date Taking? Authorizing Provider  acetaminophen  (TYLENOL ) 650 MG CR tablet Take 650 mg by mouth every 8 (eight) hours as needed for pain.   Yes [provider]  cetirizine (ZYRTEC) 10 MG tablet Take 10 mg by mouth daily.   Yes [provider]  citalopram  (CELEXA ) 10 MG tablet Take 10 mg by mouth daily.   Yes [provider]  clonazePAM  (KLONOPIN) 0.5 MG tablet Take 0.5 mg by mouth at bedtime.   Yes [provider]  dicyclomine  (BENTYL ) 10 MG capsule TAKE 1 CAPSULE (10 MG TOTAL) BY MOUTH 4 (FOUR) TIMES DAILY AS NEEDED FOR SPASMS. 11/17/23  Yes Avram Lupita BRAVO, MD  famotidine  (PEPCID ) 40 MG tablet Take 40 mg by mouth at bedtime.   Yes [provider]  fluticasone  (FLONASE ) 50 MCG/ACT nasal spray Place 1 spray into both nostrils daily as needed for allergies or rhinitis.   Yes [provider]  Fluticasone  Furoate (ARNUITY ELLIPTA ) 200 MCG/ACT AEPB Inhale 1 puff into the lungs daily. 01/10/24  Yes Tamea Dedra CROME, MD  ipratropium (ATROVENT ) 0.03 % nasal spray Place 2 sprays into both nostrils every 12 (twelve) hours. Patient taking differently: Place 2 sprays into both nostrils every 12 (twelve) hours as needed for rhinitis. 12/14/23  Yes Tamea Dedra CROME, MD  losartan  (COZAAR ) 100 MG tablet Take 100 mg by mouth daily. 05/03/24  Yes [provider]  ondansetron  (ZOFRAN ) 4 MG tablet TAKE 1 TABLET (4 MG TOTAL) BY MOUTH AS NEEDED FOR NAUSEA OR VOMITING. 09/22/23  Yes Avram Lupita BRAVO, MD  pantoprazole  (PROTONIX ) 40 MG tablet TAKE 1 TABLET BY MOUTH TWICE A DAY 10/20/23  Yes Avram Lupita BRAVO, MD  Probiotic Product (ALIGN) 10 MG CAPS Take 1 capsule by mouth daily.   Yes [provider]  promethazine  (PHENERGAN ) 25 MG tablet TAKE 1 TABLET BY MOUTH EVERY 6 HOURS AS NEEDED. 09/22/23  Yes Avram Lupita BRAVO, MD  sodium chloride  (OCEAN) 0.65 % SOLN nasal spray Place 1 spray into both nostrils as needed for congestion.   Yes [provider]  tamoxifen  (NOLVADEX ) 10 MG tablet Take 1 tablet (10 mg total) by mouth daily. 02/18/24  Yes Dasie Tinnie MATSU, NP  ticagrelor  (BRILINTA ) 90 MG TABS tablet Take 1 tablet (90 mg total) by mouth 2 (two) times daily. 05/09/24 08/07/24 Yes Keyondra Lagrand R, NP  traMADol  (ULTRAM ) 50 MG tablet Take 1 tablet (50 mg total) by mouth every 6 (six) hours as needed for moderate  pain or severe pain. 03/30/23  Yes Ebbie Cough, MD  traZODone  (DESYREL ) 50 MG tablet Take 50 mg by mouth at bedtime.   Yes [provider]  albuterol  (VENTOLIN  HFA) 108 (90 Base) MCG/ACT inhaler TAKE 2 PUFFS BY MOUTH EVERY 6 HOURS AS NEEDED FOR WHEEZE OR SHORTNESS OF BREATH 05/29/24   Tamea Dedra CROME, MD  cyclobenzaprine  (FLEXERIL ) 10 MG tablet Take 1 tablet (10 mg total) by mouth 3 (three) times daily as needed for muscle spasms. Patient taking differently: Take 10 mg by mouth at bedtime as needed for muscle spasms. 10/25/17   Mavis Purchase, MD  HYDROcodone -acetaminophen  (NORCO/VICODIN)  5-325 MG tablet Take 1 tablet by mouth every 4 (four) hours as needed for severe pain (pain score 7-10). 10/06/23   [provider]  methocarbamol (ROBAXIN) 500 MG tablet Take 500 mg by mouth every 8 (eight) hours as needed for muscle spasms.    [provider]     Family History  Adopted: Yes  Problem Relation Age of Onset   Diabetes Mother    Stroke Mother    Alzheimer's disease Mother    Hypertension Sister    Diabetes Sister    Heart attack Brother    Leukemia Brother    CAD Brother    Diabetes Brother    Colon cancer Maternal Aunt    Stroke Paternal Grandmother    Heart attack Paternal Grandfather    Esophageal cancer Neg Hx    Rectal cancer Neg Hx    Stomach cancer Neg Hx    Colon polyps Neg Hx     Social History   Socioeconomic History   Marital status: Married    Spouse name: Not on file   Number of children: 2   Years of education: Not on file   Highest education level: Not on file  Occupational History   Not on file  Tobacco Use   Smoking status: Never   Smokeless tobacco: Never  Vaping Use   Vaping status: Never Used  Substance and Sexual Activity   Alcohol use: No   Drug use: No   Sexual activity: Not Currently    Birth control/protection: None    Comment: Hysterectomy  Other Topics Concern   Not on file  Social History Narrative    Lives with husband.    Retired Environmental health practitioner   Has 2 children.   Social Drivers of Corporate investment banker Strain: Not on file  Food Insecurity: No Food Insecurity (03/16/2023)   Hunger Vital Sign    Worried About Running Out of Food in the Last Year: Never true    Ran Out of Food in the Last Year: Never true  Transportation Needs: No Transportation Needs (03/16/2023)   PRAPARE - Administrator, Civil Service (Medical): No    Lack of Transportation (Non-Medical): No  Physical Activity: Not on file  Stress: Not on file  Social Connections: Not on file    Review of Systems: A 12 point ROS discussed and pertinent positives are indicated in the HPI above.  All other systems are negative.  Review of Systems  Neurological:  Positive for headaches.  All other systems reviewed and are negative.   Vital Signs: BP (!) 141/42   Pulse (!) 55   Temp 98.1 F (36.7 C)   Resp 18   Ht 5' 4 (1.626 m)   Wt 172 lb (78 kg)   SpO2 96%   BMI 29.52 kg/m   Physical Exam Constitutional:      General: She is not in acute distress.    Appearance: She is not ill-appearing.  Cardiovascular:     Rate and Rhythm: Bradycardia present.  Pulmonary:     Effort: Pulmonary effort is normal.  Neurological:     Mental Status: She is alert and oriented to person, place, and time.     Imaging: IR ANGIO INTRA EXTRACRAN SEL COM CAROTID INNOMINATE BILAT MOD SED Result Date: 05/05/2024 CLINICAL DATA:  Debilitating left-sided pulsatile tinnitus, left-sided neck pain and headache. EXAM: BILATERAL COMMON CAROTID AND INNOMINATE ANGIOGRAPHY COMPARISON:  CT angiogram of the head and neck of  May 2025. MEDICATIONS: Heparin  1000 units IV. No antibiotic was administered within 1 hour of the procedure. ANESTHESIA/SEDATION: Versed  1 mg IV; Fentanyl  25 mcg IV Moderate Sedation Time:  50 minutes The patient was continuously monitored during the procedure by the interventional radiology nurse under  my direct supervision. CONTRAST:  Omnipaque  300 approximately 110 mL. FLUOROSCOPY TIME:  Fluoroscopy Time: 14 minutes 12 seconds (772 mGy). COMPLICATIONS: None immediate. TECHNIQUE: Informed written consent was obtained from the patient after a thorough discussion of the procedural risks, benefits and alternatives. All questions were addressed. Maximal Sterile Barrier Technique was utilized including caps, mask, sterile gowns, sterile gloves, sterile drape, hand hygiene and skin antiseptic. A timeout was performed prior to the initiation of the procedure. The right groin was prepped and draped in the usual sterile fashion. Thereafter using modified Seldinger technique, transfemoral access into the right common femoral artery was obtained without difficulty. Over an 0.035 inch guidewire, a 5 French Pinnacle sheath was inserted. Through this, and also over an 0.035 inch guidewire, a 5 [place] JB 1 catheter was advanced to the aortic arch region and selectively positioned in the right common carotid artery, the right vertebral artery, the left common carotid artery and the left vertebral artery. FINDINGS: The right common carotid arteriogram demonstrates the right external carotid artery and its major branches to be widely patent. The right internal carotid artery at the bulb to the cranial skull base demonstrates patency with moderate tortuosity in the mid cervical ICA associated with a small focal outpouching projecting laterally. Distal to this, the petrous, the cavernous and the supraclinoid right ICA are widely patent, with mild fusiform dilatation of the caval cavernous segment. Right middle cerebral artery and the right anterior cerebral artery opacify into the capillary and venous phases. Bilaterally prominent high-riding jugular bulbs are seen bilaterally slightly more prominent on the right side. Suggestion of mild stenosis of the left transverse sinus at its distal aspect. Patency of the internal jugular  veins is noted. The right vertebral artery origin is widely patent. The vessel opacifies normally to the cranial skull base. Patency is seen of a hypoplastic right posteroinferior cerebellar artery in addition to the right vertebrobasilar junction. The opacified portions of the basilar artery, the posterior cerebral arteries, the superior cerebellar arteries and transiently the right anterior-inferior cerebellar artery demonstrate wide patency into the capillary and venous phases. Unopacified blood is seen in the basilar artery from the contralateral vertebral artery. Left common carotid arteriogram demonstrates the left external carotid artery and its major branches to be widely patent. Left internal carotid artery at its proximal aspect demonstrates wide patency. There is a focal area of a transverse radiolucency of the mid cervical left ICA associated with mild fusiform dilatation. A 3D rotational arteriogram with reconstructions demonstrate there to be a significant asymmetric stenosis associated with a moderate sized fusiform dilatation distal to this. More distally, the left cervical ICA demonstrates wide patency into the petrous, the cavernous and the supraclinoid left ICA. The left middle and the left anterior cerebral artery opacifies into the capillary and venous phases. Again demonstrated are the high-riding jugular bulbs bilaterally. The dominant left vertebral artery origin is widely patent. The vessel opacifies to the cranial skull base. Patency is seen of the left vertebrobasilar junction and the left posterior-inferior cerebellar artery. The basilar artery, the posterior cerebral arteries, the superior cerebellar arteries and the anterior-inferior cerebellar arteries opacify into the capillary and venous phases. A right-sided anterior inferior cerebellar artery/posteroinferior cerebellar artery complex, a developmental  variation is noted. Partial unopacified blood is seen in the proximal basilar  artery. IMPRESSION: Focal area of transverse radiolucency of the mid left cervical ICA at the level of C2, associated with significant stenosis, and a moderate sized fusiform dilatation. This raises the possibility of fibromuscular dysplasia associated with significant hemodynamic sequela causing a debilitating left-sided pulsatile tinnitus and neck pain. Minimal FMD-like changes involving the mid right cervical ICA. Prominent high-riding jugular bulbs bilaterally. PLAN: Findings reviewed with the patient and the patient's daughter. They were informed the changes in the left internal carotid artery mid cervical ICA are suggestive of fibromuscular dysplasia associated with significant stenosis and could be responsible for the patient's debilitating left sided pulsatile tinnitus. Management options discussed were those of continued medical treatment with aspirin  daily, versus consideration of endovascular treatment with stent/flow diverter placement should the patient feel the pulsatile tinnitus is significantly impacting her quality of life in terms of interfering with sleep and her routine activities. Electronically Signed   By: Thyra Nash M.D.   On: 05/05/2024 08:27   IR 3D Independent [place] Result Date: 05/05/2024 CLINICAL DATA:  Debilitating left-sided pulsatile tinnitus, left-sided neck pain and headache. EXAM: BILATERAL COMMON CAROTID AND INNOMINATE ANGIOGRAPHY COMPARISON:  CT angiogram of the head and neck of May 2025. MEDICATIONS: Heparin  1000 units IV. No antibiotic was administered within 1 hour of the procedure. ANESTHESIA/SEDATION: Versed  1 mg IV; Fentanyl  25 mcg IV Moderate Sedation Time:  50 minutes The patient was continuously monitored during the procedure by the interventional radiology nurse under my direct supervision. CONTRAST:  Omnipaque  300 approximately 110 mL. FLUOROSCOPY TIME:  Fluoroscopy Time: 14 minutes 12 seconds (772 mGy). COMPLICATIONS: None immediate. TECHNIQUE: Informed  written consent was obtained from the patient after a thorough discussion of the procedural risks, benefits and alternatives. All questions were addressed. Maximal Sterile Barrier Technique was utilized including caps, mask, sterile gowns, sterile gloves, sterile drape, hand hygiene and skin antiseptic. A timeout was performed prior to the initiation of the procedure. The right groin was prepped and draped in the usual sterile fashion. Thereafter using modified Seldinger technique, transfemoral access into the right common femoral artery was obtained without difficulty. Over an 0.035 inch guidewire, a 5 French Pinnacle sheath was inserted. Through this, and also over an 0.035 inch guidewire, a 5 [place] JB 1 catheter was advanced to the aortic arch region and selectively positioned in the right common carotid artery, the right vertebral artery, the left common carotid artery and the left vertebral artery. FINDINGS: The right common carotid arteriogram demonstrates the right external carotid artery and its major branches to be widely patent. The right internal carotid artery at the bulb to the cranial skull base demonstrates patency with moderate tortuosity in the mid cervical ICA associated with a small focal outpouching projecting laterally. Distal to this, the petrous, the cavernous and the supraclinoid right ICA are widely patent, with mild fusiform dilatation of the caval cavernous segment. Right middle cerebral artery and the right anterior cerebral artery opacify into the capillary and venous phases. Bilaterally prominent high-riding jugular bulbs are seen bilaterally slightly more prominent on the right side. Suggestion of mild stenosis of the left transverse sinus at its distal aspect. Patency of the internal jugular veins is noted. The right vertebral artery origin is widely patent. The vessel opacifies normally to the cranial skull base. Patency is seen of a hypoplastic right posteroinferior cerebellar  artery in addition to the right vertebrobasilar junction. The opacified portions of the basilar artery,  the posterior cerebral arteries, the superior cerebellar arteries and transiently the right anterior-inferior cerebellar artery demonstrate wide patency into the capillary and venous phases. Unopacified blood is seen in the basilar artery from the contralateral vertebral artery. Left common carotid arteriogram demonstrates the left external carotid artery and its major branches to be widely patent. Left internal carotid artery at its proximal aspect demonstrates wide patency. There is a focal area of a transverse radiolucency of the mid cervical left ICA associated with mild fusiform dilatation. A 3D rotational arteriogram with reconstructions demonstrate there to be a significant asymmetric stenosis associated with a moderate sized fusiform dilatation distal to this. More distally, the left cervical ICA demonstrates wide patency into the petrous, the cavernous and the supraclinoid left ICA. The left middle and the left anterior cerebral artery opacifies into the capillary and venous phases. Again demonstrated are the high-riding jugular bulbs bilaterally. The dominant left vertebral artery origin is widely patent. The vessel opacifies to the cranial skull base. Patency is seen of the left vertebrobasilar junction and the left posterior-inferior cerebellar artery. The basilar artery, the posterior cerebral arteries, the superior cerebellar arteries and the anterior-inferior cerebellar arteries opacify into the capillary and venous phases. A right-sided anterior inferior cerebellar artery/posteroinferior cerebellar artery complex, a developmental variation is noted. Partial unopacified blood is seen in the proximal basilar artery. IMPRESSION: Focal area of transverse radiolucency of the mid left cervical ICA at the level of C2, associated with significant stenosis, and a moderate sized fusiform dilatation. This  raises the possibility of fibromuscular dysplasia associated with significant hemodynamic sequela causing a debilitating left-sided pulsatile tinnitus and neck pain. Minimal FMD-like changes involving the mid right cervical ICA. Prominent high-riding jugular bulbs bilaterally. PLAN: Findings reviewed with the patient and the patient's daughter. They were informed the changes in the left internal carotid artery mid cervical ICA are suggestive of fibromuscular dysplasia associated with significant stenosis and could be responsible for the patient's debilitating left sided pulsatile tinnitus. Management options discussed were those of continued medical treatment with aspirin  daily, versus consideration of endovascular treatment with stent/flow diverter placement should the patient feel the pulsatile tinnitus is significantly impacting her quality of life in terms of interfering with sleep and her routine activities. Electronically Signed   By: Thyra Nash M.D.   On: 05/05/2024 08:27   IR ANGIO VERTEBRAL SEL VERTEBRAL BILAT MOD SED Result Date: 05/05/2024 CLINICAL DATA:  Debilitating left-sided pulsatile tinnitus, left-sided neck pain and headache. EXAM: BILATERAL COMMON CAROTID AND INNOMINATE ANGIOGRAPHY COMPARISON:  CT angiogram of the head and neck of May 2025. MEDICATIONS: Heparin  1000 units IV. No antibiotic was administered within 1 hour of the procedure. ANESTHESIA/SEDATION: Versed  1 mg IV; Fentanyl  25 mcg IV Moderate Sedation Time:  50 minutes The patient was continuously monitored during the procedure by the interventional radiology nurse under my direct supervision. CONTRAST:  Omnipaque  300 approximately 110 mL. FLUOROSCOPY TIME:  Fluoroscopy Time: 14 minutes 12 seconds (772 mGy). COMPLICATIONS: None immediate. TECHNIQUE: Informed written consent was obtained from the patient after a thorough discussion of the procedural risks, benefits and alternatives. All questions were addressed. Maximal Sterile  Barrier Technique was utilized including caps, mask, sterile gowns, sterile gloves, sterile drape, hand hygiene and skin antiseptic. A timeout was performed prior to the initiation of the procedure. The right groin was prepped and draped in the usual sterile fashion. Thereafter using modified Seldinger technique, transfemoral access into the right common femoral artery was obtained without difficulty. Over an 0.035 inch guidewire,  a 5 French Pinnacle sheath was inserted. Through this, and also over an 0.035 inch guidewire, a 5 [place] JB 1 catheter was advanced to the aortic arch region and selectively positioned in the right common carotid artery, the right vertebral artery, the left common carotid artery and the left vertebral artery. FINDINGS: The right common carotid arteriogram demonstrates the right external carotid artery and its major branches to be widely patent. The right internal carotid artery at the bulb to the cranial skull base demonstrates patency with moderate tortuosity in the mid cervical ICA associated with a small focal outpouching projecting laterally. Distal to this, the petrous, the cavernous and the supraclinoid right ICA are widely patent, with mild fusiform dilatation of the caval cavernous segment. Right middle cerebral artery and the right anterior cerebral artery opacify into the capillary and venous phases. Bilaterally prominent high-riding jugular bulbs are seen bilaterally slightly more prominent on the right side. Suggestion of mild stenosis of the left transverse sinus at its distal aspect. Patency of the internal jugular veins is noted. The right vertebral artery origin is widely patent. The vessel opacifies normally to the cranial skull base. Patency is seen of a hypoplastic right posteroinferior cerebellar artery in addition to the right vertebrobasilar junction. The opacified portions of the basilar artery, the posterior cerebral arteries, the superior cerebellar arteries and  transiently the right anterior-inferior cerebellar artery demonstrate wide patency into the capillary and venous phases. Unopacified blood is seen in the basilar artery from the contralateral vertebral artery. Left common carotid arteriogram demonstrates the left external carotid artery and its major branches to be widely patent. Left internal carotid artery at its proximal aspect demonstrates wide patency. There is a focal area of a transverse radiolucency of the mid cervical left ICA associated with mild fusiform dilatation. A 3D rotational arteriogram with reconstructions demonstrate there to be a significant asymmetric stenosis associated with a moderate sized fusiform dilatation distal to this. More distally, the left cervical ICA demonstrates wide patency into the petrous, the cavernous and the supraclinoid left ICA. The left middle and the left anterior cerebral artery opacifies into the capillary and venous phases. Again demonstrated are the high-riding jugular bulbs bilaterally. The dominant left vertebral artery origin is widely patent. The vessel opacifies to the cranial skull base. Patency is seen of the left vertebrobasilar junction and the left posterior-inferior cerebellar artery. The basilar artery, the posterior cerebral arteries, the superior cerebellar arteries and the anterior-inferior cerebellar arteries opacify into the capillary and venous phases. A right-sided anterior inferior cerebellar artery/posteroinferior cerebellar artery complex, a developmental variation is noted. Partial unopacified blood is seen in the proximal basilar artery. IMPRESSION: Focal area of transverse radiolucency of the mid left cervical ICA at the level of C2, associated with significant stenosis, and a moderate sized fusiform dilatation. This raises the possibility of fibromuscular dysplasia associated with significant hemodynamic sequela causing a debilitating left-sided pulsatile tinnitus and neck pain. Minimal  FMD-like changes involving the mid right cervical ICA. Prominent high-riding jugular bulbs bilaterally. PLAN: Findings reviewed with the patient and the patient's daughter. They were informed the changes in the left internal carotid artery mid cervical ICA are suggestive of fibromuscular dysplasia associated with significant stenosis and could be responsible for the patient's debilitating left sided pulsatile tinnitus. Management options discussed were those of continued medical treatment with aspirin  daily, versus consideration of endovascular treatment with stent/flow diverter placement should the patient feel the pulsatile tinnitus is significantly impacting her quality of life in terms of interfering  with sleep and her routine activities. Electronically Signed   By: Thyra Nash M.D.   On: 05/05/2024 08:27    Labs:  CBC: Recent Labs    01/06/24 1455 05/04/24 0734 05/31/24 0635  WBC 6.4 9.8 6.4  HGB 12.3 12.7 12.3  HCT 36.6 37.6 36.7  PLT 179 181 187    COAGS: Recent Labs    05/04/24 0734  INR 0.9    BMP: Recent Labs    08/20/23 1000 05/04/24 0734 05/31/24 0635  NA 135 139 138  K 3.7 3.5 4.0  CL 102 104 103  CO2 28 28 25   GLUCOSE 117* 97 94  BUN 15 7* 9  CALCIUM  8.5* 8.7* 9.1  CREATININE 0.76 0.60 0.71  GFRNONAA >60 >60 >60    LIVER FUNCTION TESTS: Recent Labs    08/20/23 1000  BILITOT 0.7  AST 18  ALT 15  ALKPHOS 49  PROT 6.8  ALBUMIN 4.1    TUMOR MARKERS: No results for input(s): AFPTM, CEA, CA199, CHROMGRNA in the last 8760 hours.  Assessment and Plan:  Left ICA stenosis; pulsatile tinnitus: Amy Vang, 78 year old female, presents today for an image-guided diagnostic cerebral angiogram with possible stent placement to the left internal carotid artery. She will be admitted for overnight observation in the Neuro ICU.  Risks and benefits of this procedure were discussed with the patient including, but not limited to bleeding,  infection, vascular injury or contrast induced renal failure.  This interventional procedure involves the use of X-rays and because of the nature of the planned procedure, it is possible that we will have prolonged use of X-ray fluoroscopy.  Potential radiation risks to you include (but are not limited to) the following: - A slightly elevated risk for cancer  several years later in life. This risk is typically less than 0.5% percent. This risk is low in comparison to the normal incidence of human cancer, which is 33% for women and 50% for men according to the [employer]. - Radiation induced injury can include skin redness, resembling a rash, tissue breakdown / ulcers and hair loss (which can be temporary or permanent).   The likelihood of either of these occurring depends on the difficulty of the procedure and whether you are sensitive to radiation due to previous procedures, disease, or genetic conditions.   IF your procedure requires a prolonged use of radiation, you will be notified and given written instructions for further action.  It is your responsibility to monitor the irradiated area for the 2 weeks following the procedure and to notify your physician if you are concerned that you have suffered a radiation induced injury.    All of the patient's questions were answered, patient is agreeable to proceed. She has been NPO. She is a full code. Her last dose of Brilinta  was this morning. She did not take her aspirin  and this will be ordered for this morning.    Consent signed and in chart.  Thank you for this interesting consult.  I greatly enjoyed meeting Amy Vang and look forward to participating in their care.  A copy of this report was sent to the requesting provider on this date.  Electronically Signed: Warren Dais, AGACNP-BC 05/31/2024, 7:31 AM   I spent a total of  30 Minutes   in face to face in clinical consultation, greater than 50% of which was  counseling/coordinating care for left carotid artery stenosis.

## 2024-05-31 NOTE — Anesthesia Procedure Notes (Signed)
 Procedure Name: Intubation Date/Time: 05/31/2024 8:54 AM  Performed by: Scherrie Mast, CRNAPre-anesthesia Checklist: Patient identified, Emergency Drugs available, Suction available and Patient being monitored Patient Re-evaluated:Patient Re-evaluated prior to induction Oxygen Delivery Method: Circle System Utilized Preoxygenation: Pre-oxygenation with 100% oxygen Induction Type: IV induction Ventilation: Mask ventilation without difficulty Laryngoscope Size: Mac, Glidescope and 3 Grade View: Grade I Tube type: Oral Tube size: 7.0 mm Number of attempts: 1 Airway Equipment and Method: Stylet and Oral airway Placement Confirmation: ETT inserted through vocal cords under direct vision, positive ETCO2 and breath sounds checked- equal and bilateral Secured at: 21 cm Tube secured with: Tape Dental Injury: Teeth and Oropharynx as per pre-operative assessment

## 2024-05-31 NOTE — Plan of Care (Signed)

## 2024-05-31 NOTE — Anesthesia Procedure Notes (Signed)
 Arterial Line Insertion Start/End7/01/2024 7:45 AM, 05/31/2024 8:00 AM Performed by: Tilford Franky BIRCH, MD, Scherrie Mast, CRNA, CRNA  Patient location: Pre-op. Preanesthetic checklist: patient identified, IV checked, site marked, risks and benefits discussed, surgical consent, monitors and equipment checked, pre-op evaluation, timeout performed and anesthesia consent Lidocaine  1% used for infiltration Left, radial was placed Catheter size: 20 G Hand hygiene performed , maximum sterile barriers used  and Seldinger technique used Allen's test indicative of satisfactory collateral circulation Attempts: 1 Procedure performed without using ultrasound guided technique. Following insertion, dressing applied and Biopatch. Post procedure assessment: normal  Patient tolerated the procedure well with no immediate complications.

## 2024-05-31 NOTE — Transfer of Care (Signed)
 Immediate Anesthesia Transfer of Care Note  Patient: Amy Vang  Procedure(s) Performed: RADIOLOGY WITH ANESTHESIA  Patient Location: PACU  Anesthesia Type:General  Level of Consciousness: awake, alert , and oriented  Airway & Oxygen Therapy: Patient Spontanous Breathing and Patient connected to nasal cannula oxygen  Post-op Assessment: Report given to RN and Post -op Vital signs reviewed and stable  Post vital signs: Reviewed and stable  Last Vitals:  Vitals Value Taken Time  BP 134/64 05/31/24 11:50  Temp 36.4 C 05/31/24 11:51  Pulse 85 05/31/24 11:56  Resp 12 05/31/24 11:56  SpO2 94 % 05/31/24 11:56  Vitals shown include unfiled device data.  Last Pain:  Vitals:   05/31/24 0644  PainSc: 3       Patients Stated Pain Goal: 1 (05/31/24 9355)  Complications: No notable events documented.

## 2024-05-31 NOTE — Anesthesia Postprocedure Evaluation (Signed)
 Anesthesia Post Note  Patient: Amy Vang  Procedure(s) Performed: RADIOLOGY WITH ANESTHESIA     Patient location during evaluation: PACU Anesthesia Type: General Level of consciousness: awake and alert Pain management: pain level controlled Vital Signs Assessment: post-procedure vital signs reviewed and stable Respiratory status: spontaneous breathing, nonlabored ventilation, respiratory function stable and patient connected to nasal cannula oxygen Cardiovascular status: blood pressure returned to baseline and stable Postop Assessment: no apparent nausea or vomiting Anesthetic complications: no   There were no known notable events for this encounter.  Last Vitals:  Vitals:   05/31/24 1330 05/31/24 1345  BP: (!) 149/65 (!) 135/57  Pulse: 80 81  Resp: 13 12  Temp:  36.6 C  SpO2: 94% 97%    Last Pain:  Vitals:   05/31/24 1300  PainSc: 5                  Franky JONETTA Bald

## 2024-05-31 NOTE — Progress Notes (Signed)
 PHARMACY - ANTICOAGULATION CONSULT NOTE  Pharmacy Consult for heparin  Indication: s/p neuro IR procedure  Labs: Recent Labs    05/31/24 0635 05/31/24 2240  HGB 12.3  --   HCT 36.7  --   PLT 187  --   APTT 26  --   LABPROT 12.9  --   INR 0.9  --   HEPARINUNFRC  --  0.12*  CREATININE 0.71  --     Assessment/Plan:  78yo female therapeutic on heparin  with initial dosing s/p neuro IR procedure. Will continue infusion at current rate of 650 units/hr until off in am.  Marvetta Dauphin, PharmD, BCPS 05/31/2024 11:23 PM

## 2024-06-01 ENCOUNTER — Other Ambulatory Visit: Payer: Self-pay | Admitting: Physician Assistant

## 2024-06-01 ENCOUNTER — Encounter (HOSPITAL_COMMUNITY): Payer: Self-pay | Admitting: Interventional Radiology

## 2024-06-01 LAB — CBC WITH DIFFERENTIAL/PLATELET
Abs Immature Granulocytes: 0.04 10*3/uL (ref 0.00–0.07)
Basophils Absolute: 0 10*3/uL (ref 0.0–0.1)
Basophils Relative: 0 %
Eosinophils Absolute: 0 10*3/uL (ref 0.0–0.5)
Eosinophils Relative: 0 %
HCT: 31.2 % — ABNORMAL LOW (ref 36.0–46.0)
Hemoglobin: 10.1 g/dL — ABNORMAL LOW (ref 12.0–15.0)
Immature Granulocytes: 0 %
Lymphocytes Relative: 20 %
Lymphs Abs: 1.9 10*3/uL (ref 0.7–4.0)
MCH: 30.4 pg (ref 26.0–34.0)
MCHC: 32.4 g/dL (ref 30.0–36.0)
MCV: 94 fL (ref 80.0–100.0)
Monocytes Absolute: 0.6 10*3/uL (ref 0.1–1.0)
Monocytes Relative: 7 %
Neutro Abs: 6.6 10*3/uL (ref 1.7–7.7)
Neutrophils Relative %: 73 %
Platelets: 161 10*3/uL (ref 150–400)
RBC: 3.32 MIL/uL — ABNORMAL LOW (ref 3.87–5.11)
RDW: 12.9 % (ref 11.5–15.5)
WBC: 9.1 10*3/uL (ref 4.0–10.5)
nRBC: 0 % (ref 0.0–0.2)

## 2024-06-01 LAB — BASIC METABOLIC PANEL WITH GFR
Anion gap: 8 (ref 5–15)
BUN: 5 mg/dL — ABNORMAL LOW (ref 8–23)
CO2: 21 mmol/L — ABNORMAL LOW (ref 22–32)
Calcium: 8.2 mg/dL — ABNORMAL LOW (ref 8.9–10.3)
Chloride: 107 mmol/L (ref 98–111)
Creatinine, Ser: 0.9 mg/dL (ref 0.44–1.00)
GFR, Estimated: >60 mL/min (ref >60)
Glucose, Bld: 110 mg/dL — ABNORMAL HIGH (ref 70–99)
Potassium: 3.5 mmol/L (ref 3.5–5.1)
Sodium: 136 mmol/L (ref 135–145)

## 2024-06-01 NOTE — Discharge Summary (Signed)
 Patient ID: Amy Vang MRN: [id number] DOB/AGE: [birth year] 77 y.o.  Admit date: 05/31/2024 Discharge date: 06/01/2024  Supervising Physician: Dolphus Carrion  Patient Status: [facility] - In-pt  Admission Diagnoses: Pulsatile Tinnitus  Discharge Diagnoses:  Principal Problem:   ICAO (internal carotid artery occlusion), left Active Problems:   Pulsatile tinnitus of left ear   Discharged Condition: good  Hospital Course:  Amy Vang is a 78 y.o. 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   She is followed by Dr. Claudene of neurology who obtained an MR angiogram 03/28/24 revealing the stenosis.  She suffers with pulsatile tinnitus and headaches.   She met with Dr. Dolphus in consultation on 03/28/24 where a plan was made for the workup of pulsatile tinnitis.   MRI brain done 04/08/24 revealing no acute intracranial findings and mild chronic microvascular ischemic changes.   She was evaluated by Dr. Dolphus on 04/26/24 to discuss the results and subsequently underwent an image guided diagnostic cerebral angiogram 05/04/24.    Angio revealed = High-grade stenosis related to a circumferential fibrous band of the left internal carotid artery at the level of C2.   Dr. Dolphus recommended endovascular intervention with possible stenting.   She underwent the procedure yesterday. Dr. Jammie procedure note reads= Status post a left common carotid arteriogram   Findings   Well defined radiolucency,web,of the  proximal left internal carotid artery.  Status post placement of a 4.75 mm x 60 mm pipeline flow diverter device with significantly improved caliber and flow through the proximal left internal carotid artery.  She has done well overnight. She didn't sleep well but otherwise no complaints.  Discharge Exam: Blood pressure (!) 137/56, pulse 67, temperature 98.2  F (36.8 C), temperature source Oral, resp. rate 18, height 5' 4 (1.626 m), weight 172 lb (78 kg), SpO2 96%. Alert, awake, and oriented x3. Speech and comprehension intact. PERRL bilaterally. EOMs intact bilaterally without nystagmus or subjective diplopia. Visual fields intact bilaterally. No facial asymmetry. Tongue midline. Motor power symmetric proportional to effort. No pronator drift. Fine motor and coordination intact and symmetric. 5/5 Strength bilaterally Common femoral artery puncture site looks good, no bleeding, no hematoma, no pseudoaneurysm   Disposition: Discharge disposition: 01-Home or Self Care       Discharge Instructions     Diet general   Complete by: As directed    Discharge wound care:   Complete by: As directed    Ok to remove dressing from groin tomorrow and shower   Driving Restrictions   Complete by: As directed    No driving for 2 weeks. Ok to travel in a vehicle. No bending, stooping, pushing or pulling for 2 weeks.   Increase activity slowly   Complete by: As directed    Lifting restrictions   Complete by: As directed    No lifting, pushing, pulling > 10 pounds for 2 weeks      Allergies as of 06/01/2024       Reactions   Bactrim [sulfamethoxazole-trimethoprim] Swelling        Medication List     TAKE these medications    acetaminophen  650 MG CR tablet Commonly known as: TYLENOL  Take 650 mg by mouth every 8 (eight) hours as needed for pain.   albuterol  108 (90 Base) MCG/ACT inhaler Commonly known as: VENTOLIN  HFA TAKE 2 PUFFS BY MOUTH EVERY 6 HOURS AS NEEDED FOR WHEEZE OR SHORTNESS OF BREATH  Align 10 MG Caps Take 1 capsule by mouth daily.   Arnuity Ellipta  200 MCG/ACT Aepb Generic drug: Fluticasone  Furoate Inhale 1 puff into the lungs daily.   cetirizine 10 MG tablet Commonly known as: ZYRTEC Take 10 mg by mouth daily.   citalopram  10 MG tablet Commonly known as: CELEXA  Take 10 mg by mouth daily.   clonazePAM  0.5 MG tablet Commonly known as: KLONOPIN Take 0.5 mg by mouth at bedtime.   cyclobenzaprine  10 MG tablet Commonly known as: FLEXERIL  Take 1 tablet (10 mg total) by mouth 3 (three) times daily as needed for muscle spasms. What changed: when to take this   dicyclomine  10 MG capsule Commonly known as: BENTYL  TAKE 1 CAPSULE (10 MG TOTAL) BY MOUTH 4 (FOUR) TIMES DAILY AS NEEDED FOR SPASMS.   famotidine  40 MG tablet Commonly known as: PEPCID  Take 40 mg by mouth at bedtime.   fluticasone  50 MCG/ACT nasal spray Commonly known as: FLONASE  Place 1 spray into both nostrils daily as needed for allergies or rhinitis.   HYDROcodone -acetaminophen  5-325 MG tablet Commonly known as: NORCO/VICODIN Take 1 tablet by mouth every 4 (four) hours as needed for severe pain (pain score 7-10).   ipratropium 0.03 % nasal spray Commonly known as: ATROVENT  Place 2 sprays into both nostrils every 12 (twelve) hours. What changed:  when to take this reasons to take this   losartan  100 MG tablet Commonly known as: COZAAR  Take 100 mg by mouth daily.   methocarbamol 500 MG tablet Commonly known as: ROBAXIN Take 500 mg by mouth every 8 (eight) hours as needed for muscle spasms.   ondansetron  4 MG tablet Commonly known as: ZOFRAN  TAKE 1 TABLET (4 MG TOTAL) BY MOUTH AS NEEDED FOR NAUSEA OR VOMITING.   pantoprazole  40 MG tablet Commonly known as: PROTONIX  TAKE 1 TABLET BY MOUTH TWICE A DAY   promethazine  25 MG tablet Commonly known as: PHENERGAN  TAKE 1 TABLET BY MOUTH EVERY 6 HOURS AS NEEDED.   sodium chloride  0.65 % Soln nasal spray Commonly known as: OCEAN Place 1 spray into both nostrils as needed for congestion.   tamoxifen  10 MG tablet Commonly known as: NOLVADEX  Take 1 tablet (10 mg total) by mouth daily.   ticagrelor  90 MG Tabs tablet Commonly known as: Brilinta  Take 1 tablet (90 mg total) by mouth 2 (two) times daily.   traMADol  50 MG tablet Commonly known as: ULTRAM  Take 1 tablet  (50 mg total) by mouth every 6 (six) hours as needed for moderate pain or severe pain.   traZODone  50 MG tablet Commonly known as: DESYREL  Take 50 mg by mouth at bedtime.               Discharge Care Instructions  (From admission, onward)           Start     Ordered   06/01/24 0000  Discharge wound care:       Comments: Ok to remove dressing from groin tomorrow and shower   06/01/24 0907           Patient will follow up with Dr. Dolphus in 2 weeks. IR scheduler will call patient with appointment details.  Electronically Signed: SARI GORMAN LAMP, PA-C 06/01/2024, 9:16 AM     I have spent Greater Than 30 Minutes discharging Inocente GORMAN [place].

## 2024-06-01 NOTE — Evaluation (Signed)
 Occupational Therapy Evaluation Patient Details Name: Amy Vang MRN: [id number] DOB: [birth year] Today's Date: 06/01/2024   History of Present Illness   78 y.o. female admitted 05/31/24 for cerebral angiogram with possible stent L internal carotid artery. 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     Clinical Impressions Patient admitted for above and presents at baseline independent level for ADLs, transfers and functional mobility. Cognition, sensation, coordination, and vision are [facility].  Noted RR up to 52 with mobility, but recovered well with decreased speed and once resting in recliner. Scored 2/28 (normal) on short blessed test, dual tasking well. No further OT needs have been identified, pt has no further questions or concerns, and OT will sign off. Thank you for this referral!       If plan is discharge home, recommend the following:         Functional Status Assessment   Patient has not had a recent decline in their functional status     Equipment Recommendations   None recommended by OT     Recommendations for Other Services         Precautions/Restrictions   Precautions Precautions: None Restrictions Weight Bearing Restrictions Per Provider Order: No     Mobility Bed Mobility Overal bed mobility: Independent                  Transfers Overall transfer level: Independent Equipment used: None                      Balance Overall balance assessment: Independent                                         ADL either performed or assessed with clinical judgement   ADL Overall ADL's : Independent                                             Vision   Vision Assessment?: No apparent visual deficits;Wears glasses for reading     Perception         Praxis          Pertinent Vitals/Pain Pain Assessment Pain Assessment: No/denies pain     Extremity/Trunk Assessment Upper Extremity Assessment Upper Extremity Assessment: Overall WFL for tasks assessed   Lower Extremity Assessment Lower Extremity Assessment: Defer to PT evaluation   Cervical / Trunk Assessment Cervical / Trunk Assessment: Normal   Communication Communication Communication: No apparent difficulties   Cognition Arousal: Alert Behavior During Therapy: WFL for tasks assessed/performed Cognition: No apparent impairments             OT - Cognition Comments: pt scoring 2/28 (normal) on short blessed test, able to dual task in hallway.  She does require 1 cue to recall when to turn around while counting in reverse by 3s but able to voice where she was supposed to turn around when asked.  Cognition WFL.                 Following commands: Intact       Cueing  General Comments   Cueing Techniques: Verbal cues  daughter present and supportive; noted RR up to 52 with mobility  in hallway but returned to <30 with slowed speed, 10 at rest.   Exercises     Shoulder Instructions      Home Living Family/patient expects to be discharged to:: Private residence Living Arrangements: Spouse/significant other Available Help at Discharge: Friend(s);Available 24 hours/day Type of Home: House Home Access: Stairs to enter [employer] of Steps: 4 Entrance Stairs-Rails: Right Home Layout: One level     Bathroom Shower/Tub: Producer, television/film/video: Handicapped height     Home Equipment: Grab bars - tub/shower;Shower seat          Prior Functioning/Environment Prior Level of Function : Independent/Modified Independent;Driving                    OT Problem List:     OT Treatment/Interventions:        OT Goals(Current goals can be found in the care plan section)   Acute Rehab OT Goals Patient Stated Goal: home OT Goal Formulation:  With patient   OT Frequency:       Co-evaluation              AM-PAC OT 6 Clicks Daily Activity     Outcome Measure Help from another person eating meals?: None Help from another person taking care of personal grooming?: None Help from another person toileting, which includes using toliet, bedpan, or urinal?: None Help from another person bathing (including washing, rinsing, drying)?: None Help from another person to put on and taking off regular upper body clothing?: None Help from another person to put on and taking off regular lower body clothing?: None 6 Click Score: 24   End of Session Nurse Communication: Mobility status  Activity Tolerance: Patient tolerated treatment well Patient left: in chair;with call bell/phone within reach;with family/visitor present  OT Visit Diagnosis: Other abnormalities of gait and mobility (R26.89)                Time: 9078-9067 OT Time Calculation (min): 11 min Charges:  OT General Charges $OT Visit: 1 Visit OT Evaluation $OT Eval Low Complexity: 1 Low  Etta NOVAK, OT Acute Rehabilitation Services Office [phone] Secure Chat Preferred    Etta GORMAN Hope 06/01/2024, 9:40 AM

## 2024-06-01 NOTE — Evaluation (Signed)
 Physical Therapy Evaluation and discharge Patient Details Name: Amy Vang MRN: [id number] DOB: 1945-12-29 Today's Date: 06/01/2024  History of Present Illness  78 y.o. female admitted 05/31/24 for cerebral angiogram with possible stent L internal carotid artery. 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  Clinical Impression   Patient evaluated by Physical Therapy with no further acute PT needs identified. All education has been completed and the patient has no further questions. Pt scored 23/24 on Dynamic Gait Index.  PT is signing off. Thank you for this referral.         If plan is discharge home, recommend the following: Assist for transportation   Can travel by private vehicle        Equipment Recommendations None recommended by PT  Recommendations for Other Services       Functional Status Assessment Patient has not had a recent decline in their functional status     Precautions / Restrictions Precautions Precautions: None      Mobility  Bed Mobility Overal bed mobility: Independent                  Transfers Overall transfer level: Independent Equipment used: None                    Ambulation/Gait Ambulation/Gait assistance: Independent Gait Distance (Feet): 200 Feet Assistive device: None Gait Pattern/deviations: WFL(Within Functional Limits)   Gait velocity interpretation: >2.62 ft/sec, indicative of community ambulatory      Stairs Stairs: Yes Stairs assistance: Modified independent (Device/Increase time) Stair Management: One rail Right, Forwards Number of Stairs: 5    Wheelchair Mobility     Tilt Bed    Modified Rankin (Stroke Patients Only)       Balance Overall balance assessment: Independent                               Standardized Balance Assessment Standardized Balance  Assessment : Berg Balance Test, Dynamic Gait Index Berg Balance Test Sit to Stand: Able to stand without using hands and stabilize independently Standing Unsupported: Able to stand safely 2 minutes Sitting with Back Unsupported but Feet Supported on Floor or Stool: Able to sit safely and securely 2 minutes Stand to Sit: Sits safely with minimal use of hands Transfers: Able to transfer safely, minor use of hands Standing Unsupported with Eyes Closed: Able to stand 10 seconds safely Standing Ubsupported with Feet Together: Able to place feet together independently and stand 1 minute safely From Standing, Reach Forward with Outstretched Arm: Can reach confidently >25 cm (10) Standing Unsupported, Alternately Place Feet on Step/Stool: Able to stand independently and safely and complete 8 steps in 20 seconds Standing on One Leg: Able to lift leg independently and hold equal to or more than 3 seconds Dynamic Gait Index Level Surface: Normal Change in Gait Speed: Normal Gait with Horizontal Head Turns: Normal Gait with Vertical Head Turns: Normal Gait and Pivot Turn: Normal Step Over Obstacle: Normal Step Around Obstacles: Normal Steps: Mild Impairment Total Score: 23       Pertinent Vitals/Pain Pain Assessment Pain Assessment: No/denies pain    Home Living Family/patient expects to be discharged to:: Private residence Living Arrangements: Spouse/significant other Available Help at Discharge: Friend(s);Available 24 hours/day Type of Home: House Home Access: Stairs to enter Entrance Stairs-Rails: Right Entrance Stairs-Number of Steps: 4  Home Layout: One level Home Equipment: (P) Grab bars - tub/shower;Shower seat      Prior Function Prior Level of Function : Independent/Modified Independent;Driving                     Extremity/Trunk Assessment   Upper Extremity Assessment Upper Extremity Assessment: Defer to OT evaluation    Lower Extremity Assessment Lower  Extremity Assessment: Overall WFL for tasks assessed    Cervical / Trunk Assessment Cervical / Trunk Assessment: Normal  Communication   Communication Communication: No apparent difficulties    Cognition Arousal: Alert Behavior During Therapy: WFL for tasks assessed/performed   PT - Cognitive impairments: No apparent impairments                         Following commands: Intact       Cueing Cueing Techniques: Verbal cues     General Comments General comments (skin integrity, edema, etc.): Daughter present Banker)    Exercises     Assessment/Plan    PT Assessment Patient does not need any further PT services  PT Problem List         PT Treatment Interventions      PT Goals (Current goals can be found in the Care Plan section)  Acute Rehab PT Goals PT Goal Formulation: All assessment and education complete, DC therapy    Frequency       Co-evaluation               AM-PAC PT 6 Clicks Mobility  Outcome Measure Help needed turning from your back to your side while in a flat bed without using bedrails?: None Help needed moving from lying on your back to sitting on the side of a flat bed without using bedrails?: None Help needed moving to and from a bed to a chair (including a wheelchair)?: None Help needed standing up from a chair using your arms (e.g., wheelchair or bedside chair)?: None Help needed to walk in hospital room?: None Help needed climbing 3-5 steps with a railing? : None 6 Click Score: 24    End of Session Equipment Utilized During Treatment: Gait belt Activity Tolerance: Patient tolerated treatment well Patient left: in chair;with call bell/phone within reach;with family/visitor present Nurse Communication: Mobility status PT Visit Diagnosis: Other abnormalities of gait and mobility (R26.89)    Time: 9095-9078 PT Time Calculation (min) (ACUTE ONLY): 17 min   Charges:   PT Evaluation $PT Eval Low Complexity: 1 Low   PT  General Charges $$ ACUTE PT VISIT: 1 Visit          Macario RAMAN, PT Acute Rehabilitation Services  Office [phone]   Macario SHAUNNA Soja 06/01/2024, 9:28 AM

## 2024-06-05 ENCOUNTER — Encounter (HOSPITAL_COMMUNITY): Payer: Self-pay | Admitting: Radiology

## 2024-06-05 ENCOUNTER — Other Ambulatory Visit (HOSPITAL_COMMUNITY): Payer: Self-pay | Admitting: Interventional Radiology

## 2024-06-05 DIAGNOSIS — I771 Stricture of artery: Secondary | ICD-10-CM

## 2024-06-07 ENCOUNTER — Ambulatory Visit (HOSPITAL_COMMUNITY)

## 2024-06-07 DIAGNOSIS — G4733 Obstructive sleep apnea (adult) (pediatric): Secondary | ICD-10-CM | POA: Diagnosis not present

## 2024-06-14 ENCOUNTER — Other Ambulatory Visit (HOSPITAL_COMMUNITY)

## 2024-06-21 ENCOUNTER — Ambulatory Visit (HOSPITAL_COMMUNITY)

## 2024-06-22 ENCOUNTER — Ambulatory Visit (HOSPITAL_COMMUNITY)
Admission: RE | Admit: 2024-06-22 | Discharge: 2024-06-22 | Disposition: A | Discharge status: Home / Self Care | Source: Ambulatory Visit | Attending: Interventional Radiology | Admitting: Interventional Radiology

## 2024-06-22 DIAGNOSIS — M542 Cervicalgia: Secondary | ICD-10-CM | POA: Diagnosis not present

## 2024-06-22 DIAGNOSIS — I771 Stricture of artery: Secondary | ICD-10-CM

## 2024-06-23 DIAGNOSIS — I10 Essential (primary) hypertension: Secondary | ICD-10-CM | POA: Diagnosis not present

## 2024-06-26 HISTORY — PX: IR RADIOLOGIST EVAL & MGMT: IMG5224

## 2024-06-27 ENCOUNTER — Telehealth: Payer: Self-pay | Admitting: Internal Medicine

## 2024-06-27 NOTE — Telephone Encounter (Signed)
 Patient called stating she has been having severe diarrhea, Patient is scheduled for 08/08/24 and requesting to discuss options available in the meantime. Please advise, Thank you.

## 2024-06-28 ENCOUNTER — Encounter: Payer: Self-pay | Admitting: Pulmonary Disease

## 2024-06-28 DIAGNOSIS — J4540 Moderate persistent asthma, uncomplicated: Secondary | ICD-10-CM

## 2024-06-28 DIAGNOSIS — R053 Chronic cough: Secondary | ICD-10-CM

## 2024-06-28 DIAGNOSIS — G4733 Obstructive sleep apnea (adult) (pediatric): Secondary | ICD-10-CM | POA: Diagnosis not present

## 2024-06-28 DIAGNOSIS — J4541 Moderate persistent asthma with (acute) exacerbation: Secondary | ICD-10-CM

## 2024-06-28 NOTE — Telephone Encounter (Signed)
 Recommend testing for COVID.  If negative will send in prednisone  taper and consider switch to Breo Ellipta  from Arnuity.

## 2024-06-28 NOTE — Telephone Encounter (Signed)
 Spoke with the pt and she tells me that she has recently been put on blood thinner and wanted to make the office aware. She will keep appt as planned with Harlene on 9/9 to discuss further.

## 2024-06-29 DIAGNOSIS — I10 Essential (primary) hypertension: Secondary | ICD-10-CM | POA: Diagnosis not present

## 2024-06-29 MED ORDER — METHYLPREDNISOLONE 4 MG PO TBPK
ORAL_TABLET | ORAL | 0 refills | Status: DC
Start: 2024-06-29 — End: 2024-08-21

## 2024-06-29 MED ORDER — FLUTICASONE FUROATE-VILANTEROL 200-25 MCG/ACT IN AEPB: 1.0000 | INHALATION_SPRAY | Freq: Every day | RESPIRATORY_TRACT | 5 refills | Status: DC

## 2024-06-29 NOTE — Telephone Encounter (Signed)
 I have sent prescriptions for Medrol  Dosepak and Breo.  Breo will replace Arnuity.  Rinse mouth well after use of Breo.  If she has difficulty obtaining the Breo she is to call us .

## 2024-07-03 ENCOUNTER — Ambulatory Visit
Admission: RE | Admit: 2024-07-03 | Discharge: 2024-07-03 | Disposition: A | Discharge status: Home / Self Care | Attending: Pulmonary Disease | Admitting: Pulmonary Disease

## 2024-07-03 ENCOUNTER — Encounter: Payer: Self-pay | Admitting: Pulmonary Disease

## 2024-07-03 ENCOUNTER — Ambulatory Visit: Admitting: Pulmonary Disease

## 2024-07-03 ENCOUNTER — Ambulatory Visit
Admission: RE | Admit: 2024-07-03 | Discharge: 2024-07-03 | Disposition: A | Discharge status: Home / Self Care | Source: Ambulatory Visit | Attending: Pulmonary Disease | Admitting: Pulmonary Disease

## 2024-07-03 VITALS — BP 120/70 | HR 67 | Temp 97.1°F | Ht 64.0 in | Wt 171.4 lb

## 2024-07-03 DIAGNOSIS — R053 Chronic cough: Secondary | ICD-10-CM

## 2024-07-03 DIAGNOSIS — R052 Subacute cough: Secondary | ICD-10-CM | POA: Diagnosis not present

## 2024-07-03 DIAGNOSIS — J209 Acute bronchitis, unspecified: Secondary | ICD-10-CM | POA: Diagnosis not present

## 2024-07-03 DIAGNOSIS — J4540 Moderate persistent asthma, uncomplicated: Secondary | ICD-10-CM | POA: Insufficient documentation

## 2024-07-03 DIAGNOSIS — R0602 Shortness of breath: Secondary | ICD-10-CM | POA: Diagnosis not present

## 2024-07-03 LAB — NITRIC OXIDE: Nitric Oxide: 21

## 2024-07-03 MED ORDER — AZITHROMYCIN 250 MG PO TABS: ORAL_TABLET | ORAL | 0 refills | Status: AC

## 2024-07-03 NOTE — Progress Notes (Signed)
 Subjective:    Patient ID: Amy Vang, female    DOB: Feb 10, 1946, 78 y.o.   MRN: [id number]  Patient Care Team: Claudene Pellet, MD as PCP - General (Family Medicine) Melanee Annah BROCKS, MD as Consulting Physician (Oncology)  Chief Complaint  Patient presents with   Follow-up    Symptoms for 10 days. Cough with sputum but does not know what color. Increased SOB. No wheezing.     BACKGROUND/INTERVAL:Amy Vang is a 78 year old lifelong never smoker who presents for follow-up on the issue of cough, hoarseness and wheezing that occurs 2-3 times per year.  She has moderate persistent asthma and chronic rhinosinusitis.  She was last seen 05 Apr 2024 she was maintained on Arnuity Ellipta  200, 1 puff daily and albuterol  as needed.  Recently switched to Breo elliptica 200. She presents today for an acute visit due to symptoms of increasing cough over the last 10 days.  Has not responded to Medrol  Dosepak and switching to Breo Ellipta  from Arnuity.  HPI Discussed the use of AI scribe software for clinical note transcription with the patient, who gave verbal consent to proceed.  History of Present Illness   Amy Vang is a 78 year old female who presents with a cough that has flared up in the last ten days.  She has been experiencing a deep, 'chunky' cough for the past ten days, initially producing white phlegm, which has recently changed to yellow. No shortness of breath is noted.  A month ago, she underwent vascular surgery and was placed on Brilinta . Since starting Brilinta , she has experienced severe diarrhea without warning, which she manages with Imodium.  She is currently on prednisone , with one day remaining, but reports no improvement in her symptoms. She has also started using Breo 200 for three days but has not noticed any significant change.   She has not had any fevers, chills or sweats.  No hemoptysis.  No orthopnea or paroxysmal nocturnal dyspnea.  Chest x-ray was  ordered prior to visit and independently reviewed and reviewed with the patient.  See below.     Review of Systems A 10 point review of systems was performed and it is as noted above otherwise negative.   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   Abnormal vaginal bleeding 12/17/2021   Allergic rhinitis 12/17/2021   Adjustment disorder 12/17/2021   Cholelithiasis without obstruction 12/17/2021   Constipation 12/17/2021   Degeneration of lumbar intervertebral disc 12/17/2021   Diverticulosis of sigmoid colon 12/17/2021   Fatty liver 12/17/2021   Functional abdominal pain syndrome 12/17/2021   Gastro-esophageal reflux disease without esophagitis 12/17/2021   Hypo-osmolality and hyponatremia 12/17/2021   Internal hemorrhoids 12/17/2021   Irritable bowel syndrome without diarrhea 12/17/2021   Lymphocytosis 12/17/2021   Nausea and vomiting 12/17/2021   Overactive bladder 12/17/2021   History of colonic polyps 12/17/2021   Personal history of transient ischemic attack (TIA), and cerebral infarction without residual deficits 12/17/2021   Insomnia 12/17/2021   Pure hypercholesterolemia 12/17/2021   Restless legs syndrome 12/17/2021   Rosacea 12/17/2021   Sleep-wake schedule disorder, 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   Piriformis syndrome of right side 01/01/2021   Lower limb pain, inferior, right 09/19/2020   Lumbosacral radiculitis 09/19/2020   Body mass index (BMI) 25.0-25.9, adult 06/17/2020   Multiple thyroid   nodules 03/11/2020   Annular tear of lumbar disc 01/24/2020   Acute back pain with sciatica 12/28/2019   Encounter for orthopedic follow-up care 01/17/2019   Partial thickness rotator cuff tear 11/07/2018   Acquired trigger finger 10/28/2018   Pain in joint of right shoulder 05/06/2018   Hoarseness  12/22/2017   Cervical spondylosis with radiculopathy 10/25/2017   Neck pain 08/31/2017   Other cervical disc displacement, unspecified cervical region 08/31/2017   Sinusitis, chronic 11/18/2016   Deviated septum 09/18/2016   Dyspnea and respiratory abnormality 05/21/2016   Wheeze 05/21/2016   Chronic throat clearing 05/21/2016   Pulmonary HTN (HCC) 04/23/2016   Moderate persistent asthma 11/12/2015   Allergic rhinoconjunctivitis 11/12/2015   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   SOB (shortness of breath) 03/06/2015   OSA (obstructive sleep apnea) 01/01/2014   Benign essential HTN 01/01/2014   Obesity 01/01/2014    Social History   Tobacco Use   Smoking status: Never   Smokeless tobacco: Never  Substance Use Topics   Alcohol use: No    Allergies  Allergen Reactions   Bactrim [Sulfamethoxazole-Trimethoprim] Swelling    Current Meds  Medication Sig   acetaminophen  (TYLENOL ) 650 MG CR tablet Take 650 mg by mouth every 8 (eight) hours as needed for pain.   albuterol  (VENTOLIN  HFA) 108 (90 Base) MCG/ACT inhaler TAKE 2 PUFFS BY MOUTH EVERY 6 HOURS AS NEEDED FOR WHEEZE OR SHORTNESS OF BREATH   azithromycin  (ZITHROMAX ) 250 MG tablet Take 2 tablets (500 mg) on  Day 1,  followed by 1 tablet (250 mg) once daily on Days 2 through 5.   cetirizine (ZYRTEC) 10 MG tablet Take 10 mg by mouth daily.   citalopram  (CELEXA ) 10 MG tablet Take 10 mg by mouth daily.   clonazePAM (KLONOPIN) 0.5 MG tablet Take 0.5 mg by mouth at bedtime.   cyclobenzaprine  (FLEXERIL ) 10 MG tablet Take 1 tablet (10 mg total) by mouth 3 (three) times daily as needed for muscle spasms. (Patient taking differently: Take 10 mg by mouth at bedtime as needed for muscle spasms.)   dicyclomine  (BENTYL ) 10 MG capsule TAKE 1 CAPSULE (10 MG TOTAL) BY MOUTH 4 (FOUR) TIMES DAILY AS NEEDED FOR SPASMS.   famotidine  (PEPCID ) 40 MG tablet Take 40 mg by mouth at bedtime.   fluticasone  (FLONASE ) 50 MCG/ACT nasal spray Place 1 spray into both  nostrils daily as needed for allergies or rhinitis.   fluticasone  furoate-vilanterol (BREO ELLIPTA ) 200-25 MCG/ACT AEPB Inhale 1 puff into the lungs daily.   HYDROcodone -acetaminophen  (NORCO/VICODIN) 5-325 MG tablet Take 1 tablet by mouth every 4 (four) hours as needed for severe pain (pain score 7-10).   ipratropium (ATROVENT ) 0.03 % nasal spray Place 2 sprays into both nostrils every 12 (twelve) hours. (Patient taking differently: Place 2 sprays into both nostrils every 12 (twelve) hours as needed for rhinitis.)   losartan  (COZAAR ) 100 MG tablet Take 100 mg by mouth daily.   methocarbamol (ROBAXIN) 500 MG tablet Take 500 mg by mouth every 8 (eight) hours as needed for muscle spasms.   methylPREDNISolone  (MEDROL  DOSEPAK) 4 MG TBPK tablet Take as directed on the package.   ondansetron  (ZOFRAN ) 4 MG tablet TAKE 1 TABLET (4 MG TOTAL) BY MOUTH AS NEEDED FOR NAUSEA OR VOMITING.   pantoprazole  (PROTONIX ) 40 MG tablet TAKE 1 TABLET BY MOUTH TWICE A DAY   Probiotic Product (ALIGN) 10 MG CAPS Take 1 capsule by mouth daily.   promethazine  (PHENERGAN ) 25 MG tablet TAKE 1 TABLET BY MOUTH EVERY 6  HOURS AS NEEDED.   sodium chloride  (OCEAN) 0.65 % SOLN nasal spray Place 1 spray into both nostrils as needed for congestion.   tamoxifen  (NOLVADEX ) 10 MG tablet Take 1 tablet (10 mg total) by mouth daily.   ticagrelor  (BRILINTA ) 90 MG TABS tablet Take 1 tablet (90 mg total) by mouth 2 (two) times daily.   traMADol  (ULTRAM ) 50 MG tablet Take 1 tablet (50 mg total) by mouth every 6 (six) hours as needed for moderate pain or severe pain.   traZODone  (DESYREL ) 50 MG tablet Take 50 mg by mouth at bedtime.    Immunization History  Administered Date(s) Administered   Fluad Quad(high Dose 65+) 08/30/2022, 08/01/2023   Fluzone Influenza virus vaccine,trivalent (IIV3), split virus 09/17/2010, 08/11/2019, 01/29/2021   Influenza Split 09/03/2009, 09/03/2012, 08/22/2015   Influenza, High Dose Seasonal PF 09/05/2015,  09/01/2016, 09/06/2017, 10/11/2018, 08/18/2019   Influenza,inj,Quad PF,6+ Mos 08/30/2016   Influenza-Unspecified 08/26/2017   PFIZER(Purple Top)SARS-COV-2 Vaccination 12/23/2019, 01/13/2020, 08/27/2020, 05/06/2021   PNEUMOCOCCAL CONJUGATE-20 11/03/2023   Pneumococcal Conjugate-13 12/13/2013, 09/01/2016   Pneumococcal Polysaccharide-23 09/28/2011, 09/01/2016, 12/27/2020, 09/23/2021   Respiratory Syncytial Virus Vaccine,Recomb Aduvanted(Arexvy) 12/14/2023   Tdap 03/07/2007, 09/08/2018   Zoster Recombinant(Shingrix) 11/09/2017   Zoster, Live 03/28/2008, 08/20/2017, 11/09/2017        Objective:     BP 120/70 (BP Location: Right Arm, Cuff Size: Normal)   Pulse 67   Temp (!) 97.1 F (36.2 C)   Ht 5' 4 (1.626 m)   Wt 171 lb 6.4 oz (77.7 kg)   SpO2 96%   BMI 29.42 kg/m   SpO2: 96 % O2 Device: None (Room air)  GENERAL: Well-developed, somewhat overweight woman, no acute distress, fully ambulatory.  No conversational dyspnea. HEAD: Normocephalic, atraumatic.  EYES: Pupils equal, round, reactive to light.  No scleral icterus.  MOUTH: Teeth intact, oral mucosa moist.  No thrush. NECK: Supple. No thyromegaly. Trachea midline. No JVD.  No adenopathy. PULMONARY: Good air entry bilaterally.  No adventitious sounds. CARDIOVASCULAR: S1 and S2. Regular rate and rhythm.  No rubs, murmurs or gallops heard. ABDOMEN: Benign. MUSCULOSKELETAL: No joint deformity, no clubbing, no edema.  NEUROLOGIC: No overt focal deficit, no gait disturbance, speech is fluent. SKIN: Intact,warm,dry. PSYCH: Mood and behavior normal  Chest x-ray performed today, independently reviewed, no acute pulmonary disease noted, radiology interpretation pending:    Lab Results  Component Value Date   NITRICOXIDE 21 07/03/2024  *Low level of type II inflammation present.   Assessment & Plan:     ICD-10-CM   1. Subacute cough  R05.2 Nitric oxide     2. Moderate persistent asthma without complication  J45.40      3. Acute bronchitis, unspecified organism  J20.9       Orders Placed This Encounter  Procedures   Nitric oxide     Meds ordered this encounter  Medications   azithromycin  (ZITHROMAX ) 250 MG tablet    Sig: Take 2 tablets (500 mg) on  Day 1,  followed by 1 tablet (250 mg) once daily on Days 2 through 5.    Dispense:  6 each    Refill:  0   Question:    Acute bronchitis Acute bronchitis with a deep, productive cough for ten days, producing yellow phlegm. Chest X-ray shows low inflammation. Differential diagnosis includes pertussis, but treatment will proceed as if confirmed due to lab confirmation delay. Azithromycin  is preferred over doxycycline  to avoid exacerbating diarrhea. - Complete current course of prednisone ; no additional prednisone  needed as there is no  wheezing and nitric oxide  level low. - Continue Breo Ellipta  200. - Prescribe azithromycin  (Z-Pak).  Diarrhea due to Brilinta  (ticagrelor ) Severe diarrhea likely due to Brilinta  (ticagrelor ) following recent vascular surgery. Diarrhea is severe and occurs without warning. Imodium is being used to manage symptoms. Avoidance of doxycycline  due to current diarrhea issues. - Continue using Imodium for diarrhea management. -Discussed with vascular surgeon switch in antiplatelet medication. - Avoid doxycycline .      Advised if symptoms do not improve or worsen, to please contact office for sooner follow up or seek emergency care.    I spent 40 minutes of dedicated to the care of this patient on the date of this encounter to include pre-visit review of records, face-to-face time with the patient discussing conditions above, post visit ordering of testing, clinical documentation with the electronic health record, making appropriate referrals as documented, and communicating necessary findings to members of the patients care team.     C. Leita Sanders, MD Advanced Bronchoscopy PCCM [facility]-    *This  note was generated using voice recognition software/Dragon and/or AI transcription program.  Despite best efforts to proofread, errors can occur which can change the meaning. Any transcriptional errors that result from this process are unintentional and may not be fully corrected at the time of dictation.

## 2024-07-03 NOTE — Telephone Encounter (Signed)
 LMTCB. E2C2 please advise when patient calls back.

## 2024-07-03 NOTE — Patient Instructions (Addendum)
 VISIT SUMMARY:  Today, you were seen for a cough that has worsened over the past ten days. You also discussed severe diarrhea that started after your recent surgery and the medication you were prescribed.  YOUR PLAN:  -ACUTE BRONCHITIS: Acute bronchitis is an inflammation of the bronchial tubes in the lungs, causing a deep, productive cough. You should complete your current course of prednisone  and continue using Breo Ellipta . You have been prescribed azithromycin  (Z-Pak) to help with the infection.  -DIARRHEA DUE TO BRILINTA  (TICAGRELOR ): Your severe diarrhea is likely a side effect of Brilinta , a medication you started after your recent surgery.  You have sent a message to your primary physician to discuss switch to Plavix.  INSTRUCTIONS:  We will see you in 2 to 3 weeks time but please follow up as needed if your symptoms do not improve or if you experience any new or worsening symptoms.

## 2024-07-03 NOTE — Telephone Encounter (Signed)
 Xray order has been placed and I have notified the patient.  Nothing further needed.

## 2024-07-03 NOTE — Telephone Encounter (Unsigned)
 Copied from CRM [id number]. Topic: Clinical - Request for Lab/Test Order >> Jul 03, 2024  9:29 AM Celestine FALCON wrote: Reason for CRM: Pt stated she is supposed to have a chest x ray completed today before her appt with Dr. Tamea at 3pm.   Per notes from CMA I have scheduled you an appointment with Dr. Tamea at 3:00pm today. For the chest xray you will go to the medical mall at [facility] to have it done. Stop at the registration desk and tell them you are there to get a chest xray. I would get there at lease 30 mins before your appointment with Dr. Tamea.  I did not see the order in the chart and from my reference material KMS states  Must have orders. If no order, send CRM to [facility] before advising patient to walk in.  Medical Mall - Walk In  Mon-Fri:   7:30 AM - 6:00 PM  Does not close for lunch  Pt will be walking in today before her appt.  Pt's phone number is [phone] ok to leave a vm.

## 2024-07-03 NOTE — Addendum Note (Signed)
 Addended by: VICCI EVALENE DEL on: 07/03/2024 09:44 AM   Modules accepted: Orders

## 2024-07-03 NOTE — Telephone Encounter (Signed)
 Lets see if we can work her in today with CXR prior,

## 2024-07-05 ENCOUNTER — Other Ambulatory Visit (HOSPITAL_COMMUNITY): Payer: Self-pay | Admitting: Radiology

## 2024-07-05 DIAGNOSIS — I771 Stricture of artery: Secondary | ICD-10-CM

## 2024-07-08 DIAGNOSIS — G4733 Obstructive sleep apnea (adult) (pediatric): Secondary | ICD-10-CM | POA: Diagnosis not present

## 2024-07-09 ENCOUNTER — Encounter: Payer: Self-pay | Admitting: Internal Medicine

## 2024-07-10 ENCOUNTER — Telehealth: Payer: Self-pay | Admitting: Internal Medicine

## 2024-07-10 NOTE — Telephone Encounter (Signed)
 Patient has had IBS-D for years.  She was started on Brilinta  after neurovascular intervention and diarrhea was profuse and watery with incontinence.  1 formed stool in the month of July alone.  Sometimes nocturnal but not much.  Pepto-Bismol seems to help.  She was changed to Plavix recently just a few days ago, since she noted worsening of diarrhea with Brilinta .  Stools may be slightly formed now.  Per her MyChart message she has been taking pantoprazole  3 times a day.  I have explained that is a twice daily drug to take before breakfast and supper  She may use Pepcid  at bedtime as needed  Will see how she does with the change to Plavix and the use of Pepto-Bismol.  Consider adding cholestyramine midday since she is postcholecystectomy.  She is to observe for a few days with the new medication regimen

## 2024-07-13 ENCOUNTER — Encounter: Payer: Self-pay | Admitting: Neuroradiology

## 2024-07-13 ENCOUNTER — Ambulatory Visit (INDEPENDENT_AMBULATORY_CARE_PROVIDER_SITE_OTHER): Admitting: Neuroradiology

## 2024-07-13 ENCOUNTER — Other Ambulatory Visit: Payer: Self-pay

## 2024-07-13 VITALS — BP 139/62 | HR 84 | Ht 64.0 in | Wt 169.0 lb

## 2024-07-13 DIAGNOSIS — H93A2 Pulsatile tinnitus, left ear: Secondary | ICD-10-CM

## 2024-07-13 DIAGNOSIS — Z95818 Presence of other cardiac implants and grafts: Secondary | ICD-10-CM | POA: Diagnosis not present

## 2024-07-13 NOTE — Progress Notes (Signed)
 I had the pleasure of seeing Amy Vang in the office today for follow-up of a neurovascular procedure.  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  This was done by Devashwar on May 31, 2024 for the treatment of audible pulsations.  The procedure was done through femoral access and the patient has no concerns about the site.  Her daughter confirms that it looks good.  She was on ticagrelor  but has switched to clopidogrel.  She did not tolerate the ticagrelor .  She is currently on clopidogrel and 81 mg aspirin .  She is tolerating this without difficulty.  She feels like her balance is not as good as before the procedure, but feels like her headaches are better.  I do not hear any audible bruit over the neck or skull.  Alert and oriented with normal speech expression, fluency and comprehension. Visual fields are full to confrontation.   Face is symmetric. Strength in the arms and legs is symmetric with no drift.  Sensation is normal and symmetric. No ataxia. No inattention.  Assessment:  1 month after placement of pipeline in the cervical internal carotid artery for treatment of audible pulsations.  Recommendation:  I have recommended she continue with the clopidogrel for a total of 6 months.  She will continue with the 81 mg aspirin .  We will check an ultrasound in 5 months.  If the flow looks normal we will stop the clopidogrel at that point.  I have told her I am not certain of the cause of her audible pulsations.  I have explained that this is bothersome but benign.

## 2024-07-20 ENCOUNTER — Encounter: Payer: Self-pay | Admitting: Pulmonary Disease

## 2024-07-21 NOTE — Telephone Encounter (Signed)
 Is she having any issues with thrush?  If not, we will need to refer to ENT.

## 2024-07-23 DIAGNOSIS — I10 Essential (primary) hypertension: Secondary | ICD-10-CM | POA: Diagnosis not present

## 2024-07-27 ENCOUNTER — Ambulatory Visit: Admitting: Pulmonary Disease

## 2024-07-27 ENCOUNTER — Encounter: Payer: Self-pay | Admitting: Pulmonary Disease

## 2024-07-27 ENCOUNTER — Telehealth: Payer: Self-pay

## 2024-07-27 VITALS — BP 144/70 | HR 72 | Temp 97.8°F | Ht 64.0 in | Wt 169.4 lb

## 2024-07-27 DIAGNOSIS — K219 Gastro-esophageal reflux disease without esophagitis: Secondary | ICD-10-CM | POA: Diagnosis not present

## 2024-07-27 DIAGNOSIS — J4540 Moderate persistent asthma, uncomplicated: Secondary | ICD-10-CM | POA: Diagnosis not present

## 2024-07-27 DIAGNOSIS — R490 Dysphonia: Secondary | ICD-10-CM | POA: Diagnosis not present

## 2024-07-27 LAB — NITRIC OXIDE: Nitric Oxide: 16

## 2024-07-27 MED ORDER — DEXLANSOPRAZOLE 60 MG PO CPDR: 60.0000 mg | DELAYED_RELEASE_CAPSULE | Freq: Every day | ORAL | 2 refills | Status: DC

## 2024-07-27 NOTE — Patient Instructions (Signed)
 VISIT SUMMARY:  Today, we discussed your persistent hoarseness and cough, which may be related to your gastroesophageal reflux disease (GERD). We also reviewed your current medications and their potential side effects. Additionally, we talked about your asthma management and the importance of proper inhaler use.  YOUR PLAN:  -HOARSENESS SECONDARY TO SUSPECTED GASTROESOPHAGEAL REFLUX DISEASE (GERD): Your hoarseness is likely due to acid reflux affecting your vocal cords. We will switch your proton pump inhibitor medication to help manage your reflux symptoms better. You have an ENT evaluation scheduled for mid-September to further investigate this issue.  -GASTROESOPHAGEAL REFLUX DISEASE (GERD): GERD is a condition where stomach acid frequently flows back into the tube connecting your mouth and stomach, causing irritation. We will switch your current proton pump inhibitor to a different medication to help alleviate your symptoms. Additionally, gargling with baking soda in water  may provide some relief.  -MODERATE PERSISTENT ASTHMA, UNCOMPLICATED: Asthma is a condition in which your airways narrow and swell, producing extra mucus. You should continue using your current inhaler regimen with Breo and ensure you rinse your mouth thoroughly after each use to prevent thrush.  INSTRUCTIONS:  Please follow up with the ENT specialist in mid-September as scheduled. Continue using your current inhalers and follow the new medication regimen for GERD as discussed. If you experience any new or worsening symptoms, please contact our office.

## 2024-07-27 NOTE — Telephone Encounter (Signed)
 [facility] requesting alternative for Dexlansoprazole  DR 60 mg Cap. Or Prior Authorization.  Please advise.

## 2024-07-27 NOTE — Telephone Encounter (Signed)
 Lets refer to pharmacy team to see if we can get prior Auth she is on Plavix and is limited on what PPI she can be on and pantoprazole  is not helping she is having breakthrough symptoms.

## 2024-07-27 NOTE — Progress Notes (Unsigned)
 Subjective:    Patient ID: Amy Vang, female    DOB: 01-19-1946, 78 y.o.   MRN: [id number]  Patient Care Team: Amy Pellet, MD as PCP - General (Family Medicine) Amy Annah BROCKS, MD as Consulting Physician (Oncology)  Chief Complaint  Patient presents with   Asthma    Cough and wheezing.     BACKGROUND/INTERVAL:Amy Vang is a 78 year old lifelong never smoker who presents for follow-up on the issue of cough, hoarseness and wheezing that occurs 2-3 times per year.  She has moderate persistent asthma and chronic rhinosinusitis.  She was last seen 03 July 2024 as an ACUTE visit.  She was  switched to Breo Ellipta  200.  She presents today for follow-up from her acute visit.  She notes persistent issues with hoarseness.  HPI Discussed the use of AI scribe software for clinical note transcription with the patient, who gave verbal consent to proceed.  History of Present Illness   Amy Vang is a 78 year old female with GERD who presents with cough and hoarseness.  She has persistent hoarseness and difficulty regaining her voice. Her husband suggested that acid reflux might be contributing to her symptoms. She has a history of GERD and has noticed an increase in symptoms recently.  She started taking Plavix about three to four weeks ago as a replacement for Brillinta which was giving her diarrhea.  She wonders if it could be contributing to her symptoms as her GERD symptoms have worsened on the Plavix. She has been on tamoxifen  for approximately a year following her cancer diagnosis, tamoxifen  can cause cough even after prolonged use.  She uses inhalers and confirms that she rinses her mouth thoroughly after use. She has been on Protonix  (pantoprazole ) for a long time, taking it twice daily for reflux management. Despite this, she reports significant stomach problems since childhood, with recent exacerbation causing sleep disturbances, as she only managed four hours of  sleep last night due to discomfort.  She is currently using Breo and albuterol  inhalers. She is vigilant about checking for signs of thrush, as her daughter had it when she was young, but has not observed any symptoms herself.   She has an upcoming appointment with ENT.    Review of Systems A 10 point review of systems was performed and it is as noted above otherwise negative.   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   Abnormal vaginal bleeding 12/17/2021   Allergic rhinitis 12/17/2021   Adjustment disorder 12/17/2021   Cholelithiasis without obstruction 12/17/2021   Constipation 12/17/2021   Degeneration of lumbar intervertebral disc 12/17/2021   Diverticulosis of sigmoid colon 12/17/2021   Fatty liver 12/17/2021   Functional abdominal pain syndrome 12/17/2021   Gastro-esophageal reflux disease without esophagitis 12/17/2021   Hypo-osmolality and hyponatremia 12/17/2021   Internal hemorrhoids 12/17/2021   Irritable bowel syndrome without diarrhea 12/17/2021   Lymphocytosis 12/17/2021   Nausea and vomiting 12/17/2021   Overactive bladder 12/17/2021   History of colonic polyps 12/17/2021   Personal history of transient ischemic attack (TIA), and cerebral infarction without residual deficits 12/17/2021   Insomnia 12/17/2021   Pure hypercholesterolemia 12/17/2021   Restless legs syndrome 12/17/2021   Rosacea 12/17/2021   Sleep-wake schedule disorder, 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   Piriformis syndrome of right side 01/01/2021  Lower limb pain, inferior, right 09/19/2020   Lumbosacral radiculitis 09/19/2020   Body mass index (BMI) 25.0-25.9, adult 06/17/2020   Multiple thyroid  nodules 03/11/2020   Annular tear of lumbar disc 01/24/2020   Acute back pain with sciatica 12/28/2019   Encounter for  orthopedic follow-up care 01/17/2019   Partial thickness rotator cuff tear 11/07/2018   Acquired trigger finger 10/28/2018   Pain in joint of right shoulder 05/06/2018   Hoarseness 12/22/2017   Cervical spondylosis with radiculopathy 10/25/2017   Neck pain 08/31/2017   Other cervical disc displacement, unspecified cervical region 08/31/2017   Sinusitis, chronic 11/18/2016   Deviated septum 09/18/2016   Dyspnea and respiratory abnormality 05/21/2016   Wheeze 05/21/2016   Chronic throat clearing 05/21/2016   Pulmonary HTN (HCC) 04/23/2016   Moderate persistent asthma 11/12/2015   Allergic rhinoconjunctivitis 11/12/2015   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   SOB (shortness of breath) 03/06/2015   OSA (obstructive sleep apnea) 01/01/2014   Benign essential HTN 01/01/2014   Obesity 01/01/2014    Social History   Tobacco Use   Smoking status: Never   Smokeless tobacco: Never  Substance Use Topics   Alcohol use: No    Allergies  Allergen Reactions   Bactrim [Sulfamethoxazole-Trimethoprim] Swelling    Current Meds  Medication Sig   acetaminophen  (TYLENOL ) 650 MG CR tablet Take 650 mg by mouth every 8 (eight) hours as needed for pain.   albuterol  (VENTOLIN  HFA) 108 (90 Base) MCG/ACT inhaler TAKE 2 PUFFS BY MOUTH EVERY 6 HOURS AS NEEDED FOR WHEEZE OR SHORTNESS OF BREATH   cetirizine (ZYRTEC) 10 MG tablet Take 10 mg by mouth daily.   citalopram  (CELEXA ) 10 MG tablet Take 10 mg by mouth daily.   clonazePAM (KLONOPIN) 0.5 MG tablet Take 0.5 mg by mouth at bedtime.   clopidogrel (PLAVIX) 75 MG tablet Take 75 mg by mouth daily.   cyclobenzaprine  (FLEXERIL ) 10 MG tablet Take 1 tablet (10 mg total) by mouth 3 (three) times daily as needed for muscle spasms.   dicyclomine  (BENTYL ) 10 MG capsule TAKE 1 CAPSULE (10 MG TOTAL) BY MOUTH 4 (FOUR) TIMES DAILY AS NEEDED FOR SPASMS.   famotidine  (PEPCID ) 40 MG tablet Take 40 mg by mouth at bedtime.   fluticasone  (FLONASE ) 50 MCG/ACT nasal spray Place 1  spray into both nostrils daily as needed for allergies or rhinitis.   fluticasone  furoate-vilanterol (BREO ELLIPTA ) 200-25 MCG/ACT AEPB Inhale 1 puff into the lungs daily.   HYDROcodone -acetaminophen  (NORCO/VICODIN) 5-325 MG tablet Take 1 tablet by mouth every 4 (four) hours as needed for severe pain (pain score 7-10).   ipratropium (ATROVENT ) 0.03 % nasal spray Place 2 sprays into both nostrils every 12 (twelve) hours.   losartan  (COZAAR ) 100 MG tablet Take 100 mg by mouth daily.   methocarbamol (ROBAXIN) 500 MG tablet Take 500 mg by mouth every 8 (eight) hours as needed for muscle spasms.   methylPREDNISolone  (MEDROL  DOSEPAK) 4 MG TBPK tablet Take as directed on the package.   ondansetron  (ZOFRAN ) 4 MG tablet TAKE 1 TABLET (4 MG TOTAL) BY MOUTH AS NEEDED FOR NAUSEA OR VOMITING.   pantoprazole  (PROTONIX ) 40 MG tablet TAKE 1 TABLET BY MOUTH TWICE A DAY   Probiotic Product (ALIGN) 10 MG CAPS Take 1 capsule by mouth daily.   promethazine  (PHENERGAN ) 25 MG tablet TAKE 1 TABLET BY MOUTH EVERY 6 HOURS AS NEEDED.   sodium chloride  (OCEAN) 0.65 % SOLN nasal spray Place 1 spray into both nostrils as needed for congestion.  tamoxifen  (NOLVADEX ) 10 MG tablet Take 1 tablet (10 mg total) by mouth daily.   traMADol  (ULTRAM ) 50 MG tablet Take 1 tablet (50 mg total) by mouth every 6 (six) hours as needed for moderate pain or severe pain.   traZODone  (DESYREL ) 50 MG tablet Take 50 mg by mouth at bedtime.    Immunization History  Administered Date(s) Administered   Fluad Quad(high Dose 65+) 08/30/2022, 08/01/2023   Fluzone Influenza virus vaccine,trivalent (IIV3), split virus 09/17/2010, 08/11/2019, 01/29/2021   INFLUENZA, HIGH DOSE SEASONAL PF 09/05/2015, 09/01/2016, 09/06/2017, 10/11/2018, 08/18/2019   Influenza Split 09/03/2009, 09/03/2012, 08/22/2015   Influenza,inj,Quad PF,6+ Mos 08/30/2016   Influenza-Unspecified 08/26/2017   PFIZER(Purple Top)SARS-COV-2 Vaccination 12/23/2019, 01/13/2020, 08/27/2020,  05/06/2021   PNEUMOCOCCAL CONJUGATE-20 11/03/2023   Pneumococcal Conjugate-13 12/13/2013, 09/01/2016   Pneumococcal Polysaccharide-23 09/28/2011, 09/01/2016, 12/27/2020, 09/23/2021   Respiratory Syncytial Virus Vaccine,Recomb Aduvanted(Arexvy) 12/14/2023   Tdap 03/07/2007, 09/08/2018   Zoster Recombinant(Shingrix) 11/09/2017   Zoster, Live 03/28/2008, 08/20/2017, 11/09/2017        Objective:     BP (!) 144/70   Pulse 72   Temp 97.8 F (36.6 C) (Oral)   Ht 5' 4 (1.626 m)   Wt 169 lb 6.4 oz (76.8 kg)   SpO2 96%   BMI 29.08 kg/m   SpO2: 96 %  GENERAL: Well-developed, somewhat overweight woman, no acute distress, fully ambulatory.  No conversational dyspnea. HEAD: Normocephalic, atraumatic.  EYES: Pupils equal, round, reactive to light.  No scleral icterus.  MOUTH: Teeth intact, oral mucosa moist.  No thrush.  Posterior pharynx difficult to visualize due to crowded airway and intense gag reflex from the patient. NECK: Supple. No thyromegaly. Trachea midline. No JVD.  No adenopathy. PULMONARY: Good air entry bilaterally.  No adventitious sounds. CARDIOVASCULAR: S1 and S2. Regular rate and rhythm.  No rubs, murmurs or gallops heard. ABDOMEN: Benign. MUSCULOSKELETAL: No joint deformity, no clubbing, no edema.  NEUROLOGIC: No overt focal deficit, no gait disturbance, speech is fluent. SKIN: Intact,warm,dry. PSYCH: Mood and behavior normal  Lab Results  Component Value Date   NITRICOXIDE 16 07/27/2024  *Low nitric oxide  level, no significant type II inflammation.       Assessment & Plan:     ICD-10-CM   1. Moderate persistent asthma without complication  J45.40 Nitric oxide     2. Gastroesophageal reflux disease, unspecified whether esophagitis present  K21.9     3. Laryngopharyngeal reflux (LPR)  K21.9     4. Hoarseness of voice  R49.0       Orders Placed This Encounter  Procedures   Nitric oxide     Meds ordered this encounter  Medications   dexlansoprazole   (DEXILANT ) 60 MG capsule    Sig: Take 1 capsule (60 mg total) by mouth daily.    Dispense:  30 capsule    Refill:  2   Discussion:    Hoarseness secondary to suspected gastroesophageal reflux disease Hoarseness likely due to GERD affecting the vocal cords. No evidence of oropharyngeal thrush. Differential diagnosis includes tamoxifen  side effects, known to cause cough. - Switch proton pump inhibitor to address reflux symptoms, Dexilant  60 mg in the morning - Continue Pepcid  AC at bedtime - Avoid PPIs that can interfere with Plavix - Protonix  no longer effective - Await ENT evaluation scheduled for mid-September - Will need to follow-up with GI (Dr. Avram, [facility])  Gastroesophageal reflux disease Long-standing GERD with recent exacerbation. Currently on pantoprazole  (Protonix ) twice daily, but symptoms persist, affecting sleep. - Switch proton pump inhibitor to  a different medication (Dexilant  60 mg in the morning) - Continue Pepcid  AC at bedtime Moderate persistent asthma, uncomplicated Asthma with current inhaler regimen. No significant airway inflammation - Continue current inhaler regimen with Breo - Ensure proper rinsing after inhaler use to prevent thrush     Advised if symptoms do not improve or worsen, to please contact office for sooner follow up or seek emergency care.    I spent 32 minutes of dedicated to the care of this patient on the date of this encounter to include pre-visit review of records, face-to-face time with the patient discussing conditions above, post visit ordering of testing, clinical documentation with the electronic health record, making appropriate referrals as documented, and communicating necessary findings to members of the patients care team.     C. Leita Sanders, MD Advanced Bronchoscopy PCCM [facility]    *This note was generated using voice recognition software/Dragon and/or AI transcription program.   Despite best efforts to proofread, errors can occur which can change the meaning. Any transcriptional errors that result from this process are unintentional and may not be fully corrected at the time of dictation.

## 2024-07-28 ENCOUNTER — Encounter: Payer: Self-pay | Admitting: Pulmonary Disease

## 2024-07-28 ENCOUNTER — Other Ambulatory Visit (HOSPITAL_COMMUNITY): Payer: Self-pay

## 2024-07-28 NOTE — Telephone Encounter (Signed)
 Pharmacy Patient Advocate Encounter   Received notification from Pt Calls Messages that prior authorization for Dexlansoprazole  60MG  dr capsules  is required/requested.   Insurance verification completed.   The patient is insured through [facility] ADV[employer]/RX ADVANCE .   Per test claim: PA required; PA submitted to above mentioned insurance via Latent Key/confirmation #/EOC [id number] Status is pending

## 2024-07-30 DIAGNOSIS — I10 Essential (primary) hypertension: Secondary | ICD-10-CM | POA: Diagnosis not present

## 2024-07-30 DIAGNOSIS — J4540 Moderate persistent asthma, uncomplicated: Secondary | ICD-10-CM | POA: Diagnosis not present

## 2024-07-30 DIAGNOSIS — E7800 Pure hypercholesterolemia, unspecified: Secondary | ICD-10-CM | POA: Diagnosis not present

## 2024-07-30 DIAGNOSIS — D0511 Intraductal carcinoma in situ of right breast: Secondary | ICD-10-CM | POA: Diagnosis not present

## 2024-08-07 NOTE — Telephone Encounter (Signed)
 Will have to continue on the pantoprazole  for now that is the medication she had been on.  She should also be evaluated by her gastroenterologist due to her breakthrough symptoms.  They may be more successful in getting approval for other medications.

## 2024-08-08 ENCOUNTER — Ambulatory Visit: Admitting: Gastroenterology

## 2024-08-08 DIAGNOSIS — G4733 Obstructive sleep apnea (adult) (pediatric): Secondary | ICD-10-CM | POA: Diagnosis not present

## 2024-08-17 DIAGNOSIS — G4733 Obstructive sleep apnea (adult) (pediatric): Secondary | ICD-10-CM | POA: Diagnosis not present

## 2024-08-21 ENCOUNTER — Other Ambulatory Visit: Payer: Self-pay

## 2024-08-21 ENCOUNTER — Encounter: Payer: Self-pay | Admitting: Nurse Practitioner

## 2024-08-21 ENCOUNTER — Inpatient Hospital Stay: Attending: Nurse Practitioner | Admitting: Nurse Practitioner

## 2024-08-21 ENCOUNTER — Inpatient Hospital Stay: Admitting: Nurse Practitioner

## 2024-08-21 VITALS — BP 121/57 | HR 77 | Temp 97.7°F | Resp 18 | Ht 64.0 in | Wt 170.0 lb

## 2024-08-21 DIAGNOSIS — D0511 Intraductal carcinoma in situ of right breast: Secondary | ICD-10-CM | POA: Diagnosis not present

## 2024-08-21 DIAGNOSIS — R059 Cough, unspecified: Secondary | ICD-10-CM | POA: Diagnosis not present

## 2024-08-21 DIAGNOSIS — Z5181 Encounter for therapeutic drug level monitoring: Secondary | ICD-10-CM

## 2024-08-21 DIAGNOSIS — Z79810 Long term (current) use of selective estrogen receptor modulators (SERMs): Secondary | ICD-10-CM | POA: Diagnosis not present

## 2024-08-21 DIAGNOSIS — R490 Dysphonia: Secondary | ICD-10-CM | POA: Diagnosis not present

## 2024-08-21 DIAGNOSIS — D0510 Intraductal carcinoma in situ of unspecified breast: Secondary | ICD-10-CM

## 2024-08-21 NOTE — Progress Notes (Unsigned)
 Hematology/Oncology Consult Note [facility]  [phone] [phone]) [phone]  Patient Care Team: Claudene Pellet, MD as PCP - General (Family Medicine) Melanee Annah BROCKS, MD as Consulting Physician (Oncology)   Name of the patient: Amy Vang  [id number]  06-02-1946   Date of visit: 08/21/24  Diagnosis-right breast DCIS ER positive  Chief complaint/ Reason for visit- routine follow-up of right breast DCIS  Heme/Onc history: Patient presented as a 78 year old female diagnosed with right breast DCIS in February 2024. Patient underwent right lumpectomy on 03/10/2023 which showed intermediate nuclear grade cribriform type DCIS without necrosis and negative for invasive carcinoma. DCIS measured 4 mm in greatest dimension additional medial and superior margin negative for carcinoma margins were negative for DCIS but close at 0.75 mm from the posterior margin.  She underwent reexcision surgery with negative margins.  Given the small size of DCIS and her age adjuvant radiation therapy was not deemed to be necessary.  Patient started tamoxifen  and but is taking a lower dose of 10 mg daily since May 2024 d/t arthralgias and headache.   Interval history- Patient is 78 year old female with above history of right breast dcis s/p lumpectomy followed by reexcision with negative margins, no radiation, on tamoxifen , who returns to clinic for follow up. She was worried about possible right breast mass/lump felt during self exam, first noticed last week. No skin changes. Continues to tolerate dose reduced tamoxifen  well without significant side effects.   ECOG PS- 1 Pain scale- 0  Review of systems- Review of Systems  Constitutional:  Negative for chills, fever, malaise/fatigue and weight loss.  HENT:  Negative for congestion, ear discharge and nosebleeds.   Eyes:  Negative for blurred vision.  Respiratory:  Negative for cough, hemoptysis, sputum production, shortness of  breath and wheezing.   Cardiovascular:  Negative for chest pain, palpitations, orthopnea and claudication.  Gastrointestinal:  Negative for abdominal pain, blood in stool, constipation, diarrhea, heartburn, melena, nausea and vomiting.  Genitourinary:  Negative for dysuria, flank pain, frequency, hematuria and urgency.  Musculoskeletal:  Negative for back pain, joint pain and myalgias.  Skin:  Negative for rash.  Neurological:  Negative for dizziness, tingling, focal weakness, seizures, weakness and headaches.  Endo/Heme/Allergies:  Does not bruise/bleed easily.  Psychiatric/Behavioral:  Negative for depression and suicidal ideas. The patient does not have insomnia.     Allergies  Allergen Reactions   Bactrim [Sulfamethoxazole-Trimethoprim] Swelling   Past Medical History:  Diagnosis Date   Allergic rhinitis    Allergy    seasonal   Anemia    Anxiety    Aortic atherosclerosis (HCC) 11/01/2023   Arthritis    low back   Breast cancer (HCC) 2024   RIGHT   Cataract    removed bilateraly   Chronic constipation    COVID-19 08/2019   Degenerative disc disease, cervical    Essential hypertension    GERD (gastroesophageal reflux disease) 1957   H/O bronchitis    Headache    otc med prn tylenol    History of cerebral artery stenosis 02/2024   History of echocardiogram    in epic 04-21-2019,  normal w/ ef 60-65% and mild mitral regurg, no stenosis or restriction   History of TIAs    per pt age 26;  2008;  05/ 2013;  pt stated no residual was from sleep doctor more than likely sleep deprived than actual tia   Motion sickness    cars   Nocturia    OSA  on CPAP    Severe ,, CPAP 12 MM H2O- Dr Shlomo   Palpitations 01/09/2022   hx-no current problems per pt on 05/29/24   PONV (postoperative nausea and vomiting)    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   Restless leg syndrome    Rosacea    Seasonal asthma    albuterol  inhaler prn /elliupta inhaler   Sleep apnea    uses CPAP nightly   Stroke  (HCC) 1985   TIAx2   Past Surgical History:  Procedure Laterality Date   ANTERIOR CERVICAL DECOMP/DISCECTOMY FUSION N/A 10/25/2017   Procedure: CERVICAL SIX- CERVICAL SEVEN ANTERIOR CERVICAL DECOMPRESSION/DISCECTOMY FUSION, INTERBODY PROSTHESIS AND ANTERIOR PLATING;  Surgeon: Mavis Purchase, MD;  Location: [facility] OR;  Service: Neurosurgery;  Laterality: N/A;  CERVICAL 6- CERVICAL 7 ANTERIOR CERVICAL DECOMPRESSION/DISCECTOMY FUSION, INTERBODY PROSTHESIS AND ANTERIOR PLATING   BACK SURGERY  04/2022   BILATERAL BENIGN BREAST BX'S  20 YRS AGO   BREAST BIOPSY Right    benign   BREAST BIOPSY Right 01/27/2023   MM RT BREAST BX W LOC DEV 1ST LESION IMAGE BX SPEC STEREO GUIDE 01/27/2023 [facility]   BREAST BIOPSY Right 02/05/2023   MM RT BREAST BX W LOC DEV 1ST LESION IMAGE BX SPEC STEREO GUIDE 02/05/2023 [facility]   BREAST BIOPSY  03/09/2023   MM RT RADIOACTIVE SEED LOC MAMMO GUIDE 03/09/2023 [facility]   BREAST BIOPSY  03/09/2023   MM RT RADIOACTIVE SEED EA ADD LESION LOC MAMMO GUIDE 03/09/2023 [facility]   BREAST EXCISIONAL BIOPSY Bilateral over 10 years ago   benign   BREAST LUMPECTOMY WITH RADIOACTIVE SEED LOCALIZATION Right 03/10/2023   Procedure: RIGHT BREAST LUMPECTOMY WITH RADIOACTIVE SEED LOCALIZATION;  Surgeon: Ebbie Cough, MD;  Location: [facility] OR;  Service: General;  Laterality: Right;   CARDIAC CATHETERIZATION N/A 08/13/2016   Procedure: Right Heart Cath;  Surgeon: Ezra GORMAN Shuck, MD;  Location: [facility] INVASIVE CV LAB;  Service: Cardiovascular;  Laterality: N/A;    normal study, no evidence pulm HTN   CARPAL TUNNEL RELEASE  2009   BILATERAL   CATARACT EXTRACTION W/PHACO Right 02/27/2020   Procedure: CATARACT EXTRACTION PHACO AND INTRAOCULAR LENS PLACEMENT (IOC) RIGHT 3.60 00:23.9;  Surgeon: Jaye Fallow, MD;  Location: [facility]ERY [facility];  Service: Ophthalmology;  Laterality: Right;  sleep apnea   CATARACT EXTRACTION W/PHACO Left 05/21/2020    Procedure: CATARACT EXTRACTION PHACO AND INTRAOCULAR LENS PLACEMENT (IOC) LEFT 3.69 00:30.9;  Surgeon: Jaye Fallow, MD;  Location: [facility];  Service: Ophthalmology;  Laterality: Left;  sleep apnea   CHOLECYSTECTOMY N/A 03/25/2021   Procedure: LAPAROSCOPIC CHOLECYSTECTOMY;  Surgeon: Ebbie Cough, MD;  Location: [facility] OR;  Service: General;  Laterality: N/A;   CYSTOCELE REPAIR  12/11/2011   Procedure: ANTERIOR REPAIR (CYSTOCELE);  Surgeon: Mark C Ottelin, MD;  Location: [facility];  Service: Urology;  Laterality: N/A;  1 1/2 hour requested for case  Anterior repair and mid Urethral Sling   INCISIONAL HERNIA REPAIR N/A 12/24/2021   Procedure: LAP ASSISTED INCISIONAL HERNIA REPAIR WITH MESH;  Surgeon: Ebbie Cough, MD;  Location: [facility] OR;  Service: General;  Laterality: N/A;  GEN & TAP BLOCK RNFA   IR 3D INDEPENDENT WKST  05/04/2024   IR ANGIO INTRA EXTRACRAN SEL COM CAROTID INNOMINATE BILAT MOD SED  05/04/2024   IR ANGIO INTRA EXTRACRAN SEL INTERNAL CAROTID UNI L MOD SED  05/31/2024   IR ANGIO VERTEBRAL SEL VERTEBRAL BILAT MOD SED  05/04/2024   IR CT HEAD LTD  05/31/2024  IR RADIOLOGIST EVAL & MGMT  03/30/2024   IR RADIOLOGIST EVAL & MGMT  06/26/2024   IR TRANSCATH/EMBOLIZ  05/31/2024   MASS EXCISION N/A 07/24/2019   Procedure: EXCISION OF VAGINAL FOREIGN BODY;  Surgeon: Ottelin, Mark, MD;  Location: [facility];  Service: Urology;  Laterality: N/A;   NASAL SEPTOPLASTY W/ TURBINOPLASTY Bilateral 11/18/2016   Procedure: NASAL SEPTOPLASTY WITH BILATERAL TURBINATE REDUCTION;  Surgeon: Alm Bouche, MD;  Location: [facility] OR;  Service: ENT;  Laterality: Bilateral;   NONINVASIVE VASCULAR CAROTID STUDY  09/14/2007   BILATERAL MILD MIX PLAQUE THROUGHOUT, NO SIGNIFICANT BILATERAL ICA STENOSIS   PUBOVAGINAL SLING  12/11/2011   Procedure: CARLOYN GLADE;  Surgeon: Oneil JAYSON Rafter, MD;  Location: [facility];  Service: Urology;  Laterality:  N/A;   PULLEY RELEASE RIGHT THUMB  2009   RADIOACTIVE SEED GUIDED EXCISIONAL BREAST BIOPSY Right 03/10/2023   Procedure: RADIOACTIVE SEED GUIDED EXCISIONAL RIGHT BREAST BIOPSY;  Surgeon: Ebbie Cough, MD;  Location: [facility] OR;  Service: General;  Laterality: Right;   RADIOLOGY WITH ANESTHESIA N/A 05/31/2024   Procedure: RADIOLOGY WITH ANESTHESIA;  Surgeon: Dolphus Carrion, MD;  Location: [facility] OR;  Service: Radiology;  Laterality: N/A;   RE-EXCISION OF BREAST LUMPECTOMY Right 03/30/2023   Procedure: RE-EXCISION OF RIGHT BREAST LUMPECTOMY;  Surgeon: Ebbie Cough, MD;  Location: [facility];  Service: General;  Laterality: Right;   SHOULDER ARTHROSCOPY DISTAL CLAVICLE EXCISION AND OPEN ROTATOR CUFF REPAIR Right 01-17-2019   dr supple @[facility]    SINUS ENDO WITH FUSION Bilateral 11/18/2016   Procedure: BILATERAL ENDOSCOPIC SINUS SURGERY;  Surgeon: Alm Bouche, MD;  Location: [facility] OR;  Service: ENT;  Laterality: Bilateral;   UMBILICAL HERNIA REPAIR N/A 03/25/2021   Procedure: Primary REPAIR UMBILICAL HERNIA;  Surgeon: Ebbie Cough, MD;  Location: [facility] OR;  Service: General;  Laterality: N/A;   UPPER GASTROINTESTINAL ENDOSCOPY     VAGINAL HYSTERECTOMY  AGE 74   Social History   Socioeconomic History   Marital status: Married    Spouse name: Not on file   Number of children: 2   Years of education: Not on file   Highest education level: Not on file  Occupational History   Not on file  Tobacco Use   Smoking status: Never   Smokeless tobacco: Never  Vaping Use   Vaping status: Never Used  Substance and Sexual Activity   Alcohol use: No   Drug use: No   Sexual activity: Not Currently    Birth control/protection: None    Comment: Hysterectomy  Other Topics Concern   Not on file  Social History Narrative   Lives with husband.    Retired Environmental health practitioner   Has 2 children.   Social Drivers of Corporate investment banker Strain: Not on file  Food  Insecurity: No Food Insecurity (05/31/2024)   Hunger Vital Sign    Worried About Running Out of Food in the Last Year: Never true    Ran Out of Food in the Last Year: Never true  Transportation Needs: No Transportation Needs (05/31/2024)   PRAPARE - Administrator, Civil Service (Medical): No    Lack of Transportation (Non-Medical): No  Physical Activity: Not on file  Stress: Not on file  Social Connections: Patient Declined (05/31/2024)   Social Connection and Isolation Panel    Frequency of Communication with Friends and Family: Patient declined    Frequency of Social Gatherings with Friends and Family: Patient declined  Attends Religious Services: Patient declined    Active Member of Clubs or Organizations: Patient declined    Attends Banker Meetings: Patient declined    Marital Status: Patient declined  Intimate Partner Violence: Not At Risk (05/31/2024)   Humiliation, Afraid, Rape, and Kick questionnaire    Fear of Current or Ex-Partner: No    Emotionally Abused: No    Physically Abused: No    Sexually Abused: No   Family History  Adopted: Yes  Problem Relation Age of Onset   Diabetes Mother    Stroke Mother    Alzheimer's disease Mother    Hypertension Sister    Diabetes Sister    Heart attack Brother    Leukemia Brother    CAD Brother    Diabetes Brother    Colon cancer Maternal Aunt    Stroke Paternal Grandmother    Heart attack Paternal Grandfather    Esophageal cancer Neg Hx    Rectal cancer Neg Hx    Stomach cancer Neg Hx    Colon polyps Neg Hx    Current Outpatient Medications:    acetaminophen  (TYLENOL ) 650 MG CR tablet, Take 650 mg by mouth every 8 (eight) hours as needed for pain., Disp: , Rfl:    albuterol  (VENTOLIN  HFA) 108 (90 Base) MCG/ACT inhaler, TAKE 2 PUFFS BY MOUTH EVERY 6 HOURS AS NEEDED FOR WHEEZE OR SHORTNESS OF BREATH, Disp: 6.7 each, Rfl: 4   cetirizine (ZYRTEC) 10 MG tablet, Take 10 mg by mouth daily., Disp: , Rfl:     citalopram  (CELEXA ) 10 MG tablet, Take 10 mg by mouth daily., Disp: , Rfl:    clonazePAM (KLONOPIN) 0.5 MG tablet, Take 0.5 mg by mouth at bedtime., Disp: , Rfl:    clopidogrel (PLAVIX) 75 MG tablet, Take 75 mg by mouth daily., Disp: , Rfl:    cyclobenzaprine  (FLEXERIL ) 10 MG tablet, Take 1 tablet (10 mg total) by mouth 3 (three) times daily as needed for muscle spasms., Disp: 50 tablet, Rfl: 1   dexlansoprazole  (DEXILANT ) 60 MG capsule, Take 1 capsule (60 mg total) by mouth daily., Disp: 30 capsule, Rfl: 2   dicyclomine  (BENTYL ) 10 MG capsule, TAKE 1 CAPSULE (10 MG TOTAL) BY MOUTH 4 (FOUR) TIMES DAILY AS NEEDED FOR SPASMS., Disp: 360 capsule, Rfl: 0   famotidine  (PEPCID ) 40 MG tablet, Take 40 mg by mouth at bedtime., Disp: , Rfl:    fluticasone  (FLONASE ) 50 MCG/ACT nasal spray, Place 1 spray into both nostrils daily as needed for allergies or rhinitis., Disp: , Rfl:    fluticasone  furoate-vilanterol (BREO ELLIPTA ) 200-25 MCG/ACT AEPB, Inhale 1 puff into the lungs daily., Disp: 60 each, Rfl: 5   HYDROcodone -acetaminophen  (NORCO/VICODIN) 5-325 MG tablet, Take 1 tablet by mouth every 4 (four) hours as needed for severe pain (pain score 7-10)., Disp: , Rfl:    ipratropium (ATROVENT ) 0.03 % nasal spray, Place 2 sprays into both nostrils every 12 (twelve) hours., Disp: 30 mL, Rfl: 12   losartan  (COZAAR ) 100 MG tablet, Take 100 mg by mouth daily., Disp: , Rfl:    methocarbamol (ROBAXIN) 500 MG tablet, Take 500 mg by mouth every 8 (eight) hours as needed for muscle spasms., Disp: , Rfl:    methylPREDNISolone  (MEDROL  DOSEPAK) 4 MG TBPK tablet, Take as directed on the package., Disp: 21 tablet, Rfl: 0   ondansetron  (ZOFRAN ) 4 MG tablet, TAKE 1 TABLET (4 MG TOTAL) BY MOUTH AS NEEDED FOR NAUSEA OR VOMITING., Disp: 18 tablet, Rfl: 4   Probiotic  Product (ALIGN) 10 MG CAPS, Take 1 capsule by mouth daily., Disp: , Rfl:    promethazine  (PHENERGAN ) 25 MG tablet, TAKE 1 TABLET BY MOUTH EVERY 6 HOURS AS NEEDED., Disp:  30 tablet, Rfl: 3   sodium chloride  (OCEAN) 0.65 % SOLN nasal spray, Place 1 spray into both nostrils as needed for congestion., Disp: , Rfl:    tamoxifen  (NOLVADEX ) 10 MG tablet, Take 1 tablet (10 mg total) by mouth daily., Disp: 90 tablet, Rfl: 3   traMADol  (ULTRAM ) 50 MG tablet, Take 1 tablet (50 mg total) by mouth every 6 (six) hours as needed for moderate pain or severe pain., Disp: 10 tablet, Rfl: 0   traZODone  (DESYREL ) 50 MG tablet, Take 50 mg by mouth at bedtime., Disp: , Rfl:   Physical exam:  Vitals:   08/21/24 1303  BP: (!) 121/57  Pulse: 77  Resp: 18  Temp: 97.7 F (36.5 C)  TempSrc: Tympanic  Weight: 170 lb (77.1 kg)  Height: 5' 4 (1.626 m)    Physical Exam Vitals reviewed.  Constitutional:      Appearance: She is not ill-appearing.  Cardiovascular:     Rate and Rhythm: Normal rate and regular rhythm.  Pulmonary:     Effort: Pulmonary effort is normal.     Breath sounds: Normal breath sounds.  Chest:     Comments: Breast exam was performed in seated and lying down position. Patient is status post right lumpectomy with a well-healed surgical scar. No evidence of any palpable masses. No evidence of axillary adenopathy. No evidence of any palpable masses or lumps in the left breast. No evidence of left axillary adenopathy   Skin:    General: Skin is warm and dry.  Neurological:     Mental Status: She is alert and oriented to person, place, and time.  Psychiatric:        Mood and Affect: Mood normal.        Behavior: Behavior normal.       Latest Ref Rng & Units 06/01/2024    6:35 AM  CMP  Glucose 70 - 99 mg/dL 889   BUN 8 - 23 mg/dL 5   Creatinine 9.55 - 8.99 mg/dL 9.09   Sodium 864 - 854 mmol/L 136   Potassium 3.5 - 5.1 mmol/L 3.5   Chloride 98 - 111 mmol/L 107   CO2 22 - 32 mmol/L 21   Calcium  8.9 - 10.3 mg/dL 8.2       Latest Ref Rng & Units 06/01/2024    6:35 AM  CBC  WBC 4.0 - 10.5 K/uL 9.1   Hemoglobin 12.0 - 15.0 g/dL 89.8   Hematocrit 63.9 -  46.0 % 31.2   Platelets 150 - 400 K/uL 161    Radiology review:  01/10/24- MM 3D Diagnostic Mammogram Bilateral ACR Breast Density Category c: The breasts are heterogeneously dense, which may obscure small masses.   FINDINGS: Lumpectomy changes are seen in the right breast. No suspicious mass or malignant type microcalcifications identified in either breast.   IMPRESSION: No evidence of malignancy in either breast.   RECOMMENDATION: Bilateral diagnostic mammogram in 1 year is recommended.    BI-RADS CATEGORY  2: Benign.   Electronically Signed   By: Dina  Arceo M.D.   On: 01/10/2024 12:20   Assessment and plan- Patient is a 78 y.o. female who returns to clinic for follow up of:   History of right breast DCIS - ER positive, currently on tamoxifen . 01/2024 mammogram was benign  appearing. Clinically without signs or symptoms of recurrence today. She will continue low dose tamoxifen  10 mg daily. Refill provided today. Plan to repeat mammogram Feb 2026. LFTs previously normal.   Disposition:  Feb mammogram 6 mo- Dr Melanee for tamoxifen sheridan f/u & mammo results- la   Visit Diagnosis No diagnosis found.  Tinnie Dawn, DNP, AGNP-C, [facility] [phone] (clinic) 08/21/2024

## 2024-08-22 DIAGNOSIS — I10 Essential (primary) hypertension: Secondary | ICD-10-CM | POA: Diagnosis not present

## 2024-08-23 ENCOUNTER — Ambulatory Visit (INDEPENDENT_AMBULATORY_CARE_PROVIDER_SITE_OTHER)

## 2024-08-23 DIAGNOSIS — W908XXA Exposure to other nonionizing radiation, initial encounter: Secondary | ICD-10-CM | POA: Diagnosis not present

## 2024-08-23 DIAGNOSIS — Z1283 Encounter for screening for malignant neoplasm of skin: Secondary | ICD-10-CM

## 2024-08-23 DIAGNOSIS — L821 Other seborrheic keratosis: Secondary | ICD-10-CM | POA: Diagnosis not present

## 2024-08-23 DIAGNOSIS — D229 Melanocytic nevi, unspecified: Secondary | ICD-10-CM

## 2024-08-23 DIAGNOSIS — L820 Inflamed seborrheic keratosis: Secondary | ICD-10-CM

## 2024-08-23 DIAGNOSIS — L570 Actinic keratosis: Secondary | ICD-10-CM

## 2024-08-23 DIAGNOSIS — D1801 Hemangioma of skin and subcutaneous tissue: Secondary | ICD-10-CM | POA: Diagnosis not present

## 2024-08-23 DIAGNOSIS — L578 Other skin changes due to chronic exposure to nonionizing radiation: Secondary | ICD-10-CM

## 2024-08-23 DIAGNOSIS — Z85828 Personal history of other malignant neoplasm of skin: Secondary | ICD-10-CM

## 2024-08-23 DIAGNOSIS — C4490 Unspecified malignant neoplasm of skin, unspecified: Secondary | ICD-10-CM

## 2024-08-23 DIAGNOSIS — L814 Other melanin hyperpigmentation: Secondary | ICD-10-CM

## 2024-08-23 NOTE — Progress Notes (Signed)
 Subjective   Amy Vang is a 78 y.o. female who presents for the following: Total body skin exam for skin cancer screening and mole check. The patient has spots, moles and lesions to be evaluated, some may be new or changing and the patient may have concern these could be cancer.. Patient is new patient  Today patient reports: Several spots to check on the face, right arm, back, legs.   Review of Systems:    No other skin or systemic complaints except as noted in HPI or Assessment and Plan.  The following portions of the chart were reviewed this encounter and updated as appropriate: medications, allergies, medical history  Relevant Medical History:  Personal history of non melanoma skin cancer - see medical history for full details   Objective  Well appearing patient in no apparent distress; mood and affect are within normal limits. Examination was performed of the: Full Skin Examination: scalp, head, eyes, ears, nose, lips, neck, chest, axillae, abdomen, back, buttocks, bilateral upper extremities, bilateral lower extremities, hands, feet, fingers, toes, fingernails, and toenails.   Examination notable for: Angioma(s): Scattered red vascular papule(s)  , Lentigo/lentigines: Scattered pigmented macules that are tan to brown in color and are somewhat non-uniform in shape and concentrated in the sun-exposed areas, Nevus/nevi: Scattered well-demarcated, regular, pigmented macule(s) and/or papule(s)  , Seborrheic Keratosis(es): Stuck-on appearing keratotic papule(s) on the trunk, some  irritated with redness, crusting, edema, and/or partial avulsion, Actinic Damage/Elastosis: chronic sun damage: dyspigmentation, telangiectasia, and wrinkling  - Multiple scaly erythematous macules consistent with actinic keratoses located on R upper [place] border  Examination limited by: Undergarments and Patient deferred removal     Left Temple Stuck on waxy paps with erythema Right Upper  Vermilion Lip Pink scaly macules  Assessment & Plan   SKIN CANCER SCREENING PERFORMED TODAY.  BENIGN SKIN FINDINGS  - Lentigines  - Seborrheic keratoses  - Hemangiomas   - Nevus/Multiple Benign Nevi - Reassurance provided regarding the benign appearance of lesions noted on exam today; no treatment is indicated in the absence of symptoms/changes. - Reinforced importance of photoprotective strategies including liberal and frequent sunscreen use of a broad-spectrum SPF 30 or greater, use of protective clothing, and sun avoidance for prevention of cutaneous malignancy and photoaging.  Counseled patient on the importance of regular self-skin monitoring as well as routine clinical skin examinations as scheduled.   ACTINIC DAMAGE - Chronic condition, secondary to cumulative UV/sun exposure - Recommend daily broad spectrum sunscreen SPF 30+ to sun-exposed areas, reapply every 2 hours as needed.  - Staying in the shade or wearing long sleeves, sun glasses (UVA+UVB protection) and wide brim hats (4-inch brim around the entire circumference of the hat) are also recommended for sun protection.  - Call for new or changing lesions.  Personal history of non melanoma skin cancer -  unknown type of the back. - Reviewed medical history for full details  - Reviewed sun protective measures as above - Encouraged full body skin exams     Level of service outlined above   Procedures, orders, diagnosis for this visit:  INFLAMED SEBORRHEIC KERATOSIS Left Temple Symptomatic, irritating, patient would like treated. Destruction of lesion - Left Temple Complexity: simple   Destruction method: cryotherapy   Informed consent: discussed and consent obtained   Timeout:  patient name, date of birth, surgical site, and procedure verified Lesion destroyed using liquid nitrogen: Yes   Region frozen until ice ball extended beyond lesion: Yes   Cryo cycles:  1 or 2. Outcome: patient tolerated procedure well with no  complications   Post-procedure details: wound care instructions given   Additional details:  Prior to procedure, discussed risks of blister formation, small wound, skin dyspigmentation, or rare scar following cryotherapy. Recommend Vaseline ointment to treated areas while healing.   ACTINIC KERATOSIS Right Upper Vermilion Lip Actinic keratoses are precancerous spots that appear secondary to cumulative UV radiation exposure/sun exposure over time. They are chronic with expected duration over 1 year. A portion of actinic keratoses will progress to squamous cell carcinoma of the skin. It is not possible to reliably predict which spots will progress to skin cancer and so treatment is recommended to prevent development of skin cancer.  Recommend daily broad spectrum sunscreen SPF 30+ to sun-exposed areas, reapply every 2 hours as needed.  Recommend staying in the shade or wearing long sleeves, sun glasses (UVA+UVB protection) and wide brim hats (4-inch brim around the entire circumference of the hat). Call for new or changing lesions. Destruction of lesion - Right Upper Vermilion Lip Complexity: simple   Destruction method: cryotherapy   Informed consent: discussed and consent obtained   Timeout:  patient name, date of birth, surgical site, and procedure verified Lesion destroyed using liquid nitrogen: Yes   Region frozen until ice ball extended beyond lesion: Yes   Cryo cycles: 1 or 2. Outcome: patient tolerated procedure well with no complications   Post-procedure details: wound care instructions given   Additional details:  Prior to procedure, discussed risks of blister formation, small wound, skin dyspigmentation, or rare scar following cryotherapy. Recommend Vaseline ointment to treated areas while healing.    Inflamed seborrheic keratosis -     Destruction of lesion  Actinic keratosis -     Destruction of lesion    Return to clinic: Return in about 1 year (around 08/23/2025) for  TBSE.  LILLETTE Andrea Kerns, CMA, am acting as scribe for Lauraine JAYSON Kanaris, MD .  Documentation: I have reviewed the above documentation for accuracy and completeness, and I agree with the above.  Lauraine JAYSON Kanaris, MD

## 2024-08-23 NOTE — Patient Instructions (Addendum)

## 2024-08-25 ENCOUNTER — Telehealth: Payer: Self-pay | Admitting: Neuroradiology

## 2024-08-25 ENCOUNTER — Encounter: Payer: Self-pay | Admitting: Neuroradiology

## 2024-08-25 NOTE — Telephone Encounter (Signed)
 She is having left sided headaches similar to before her procedure with Dr. Monna.  No numbness or weakness.  No trouble with speech.  No visual changes.  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  As such the recurrence of symptoms is not unexpected or concerning for a neurovascular issue.    She will follow up with her primary care.

## 2024-08-29 DIAGNOSIS — D0511 Intraductal carcinoma in situ of right breast: Secondary | ICD-10-CM | POA: Diagnosis not present

## 2024-08-29 DIAGNOSIS — J4540 Moderate persistent asthma, uncomplicated: Secondary | ICD-10-CM | POA: Diagnosis not present

## 2024-08-29 DIAGNOSIS — I10 Essential (primary) hypertension: Secondary | ICD-10-CM | POA: Diagnosis not present

## 2024-08-29 DIAGNOSIS — E7800 Pure hypercholesterolemia, unspecified: Secondary | ICD-10-CM | POA: Diagnosis not present

## 2024-08-30 ENCOUNTER — Other Ambulatory Visit: Payer: Self-pay | Admitting: Internal Medicine

## 2024-08-30 DIAGNOSIS — K219 Gastro-esophageal reflux disease without esophagitis: Secondary | ICD-10-CM

## 2024-09-02 ENCOUNTER — Other Ambulatory Visit: Payer: Self-pay | Admitting: Internal Medicine

## 2024-09-06 ENCOUNTER — Other Ambulatory Visit
Admission: RE | Admit: 2024-09-06 | Discharge: 2024-09-06 | Disposition: A | Discharge status: Home / Self Care | Source: Ambulatory Visit | Attending: Ophthalmology | Admitting: Ophthalmology

## 2024-09-06 DIAGNOSIS — G4452 New daily persistent headache (NDPH): Secondary | ICD-10-CM | POA: Insufficient documentation

## 2024-09-06 DIAGNOSIS — M3501 Sicca syndrome with keratoconjunctivitis: Secondary | ICD-10-CM | POA: Diagnosis not present

## 2024-09-06 DIAGNOSIS — H43813 Vitreous degeneration, bilateral: Secondary | ICD-10-CM | POA: Diagnosis not present

## 2024-09-06 LAB — CBC
HCT: 36.7 % (ref 36.0–46.0)
Hemoglobin: 12.3 g/dL (ref 12.0–15.0)
MCH: 30.4 pg (ref 26.0–34.0)
MCHC: 33.5 g/dL (ref 30.0–36.0)
MCV: 90.8 fL (ref 80.0–100.0)
Platelets: 187 K/uL (ref 150–400)
RBC: 4.04 MIL/uL (ref 3.87–5.11)
RDW: 12.5 % (ref 11.5–15.5)
WBC: 5 K/uL (ref 4.0–10.5)
nRBC: 0 % (ref 0.0–0.2)

## 2024-09-06 LAB — SEDIMENTATION RATE: Sed Rate: 1 mm/h (ref 0–30)

## 2024-09-06 LAB — C-REACTIVE PROTEIN: CRP: 0.5 mg/dL (ref <1.0)

## 2024-09-07 ENCOUNTER — Encounter: Payer: Self-pay | Admitting: Pulmonary Disease

## 2024-09-07 ENCOUNTER — Ambulatory Visit (INDEPENDENT_AMBULATORY_CARE_PROVIDER_SITE_OTHER): Admitting: Pulmonary Disease

## 2024-09-07 VITALS — BP 124/60 | HR 69 | Temp 97.9°F | Ht 64.0 in | Wt 170.0 lb

## 2024-09-07 DIAGNOSIS — K219 Gastro-esophageal reflux disease without esophagitis: Secondary | ICD-10-CM | POA: Diagnosis not present

## 2024-09-07 DIAGNOSIS — R053 Chronic cough: Secondary | ICD-10-CM | POA: Diagnosis not present

## 2024-09-07 DIAGNOSIS — J4540 Moderate persistent asthma, uncomplicated: Secondary | ICD-10-CM | POA: Diagnosis not present

## 2024-09-07 DIAGNOSIS — G4733 Obstructive sleep apnea (adult) (pediatric): Secondary | ICD-10-CM | POA: Diagnosis not present

## 2024-09-07 LAB — NITRIC OXIDE: Nitric Oxide: 23

## 2024-09-07 MED ORDER — SPIRIVA RESPIMAT 1.25 MCG/ACT IN AERS
2.0000 | INHALATION_SPRAY | Freq: Every day | RESPIRATORY_TRACT | 0 refills | Status: DC
Start: 2024-09-07 — End: 2024-10-16

## 2024-09-07 NOTE — Progress Notes (Signed)
 Subjective:    Patient ID: Amy Vang, female    DOB: [birth year], 78 y.o.   MRN: [id number]  Patient Care Team: Claudene Pellet, MD as PCP - General (Family Medicine) Melanee Annah BROCKS, MD as Consulting Physician (Oncology)  Chief Complaint  Patient presents with   Asthma    Cough with thick white phlegm x 10 days. No wheezing or shortness of breath.     BACKGROUND/INTERVAL:Amy Vang is a 78 year old lifelong never smoker who presents for follow-up on the issue of cough, hoarseness and wheezing that occurs 2-3 times per year.  She has moderate persistent asthma and chronic rhinosinusitis.  She was seen on 03 July 2024 as an ACUTE visit.  She was  switched to Breo Ellipta  200.  Last follow-up was 27 July 2024.  She saw ENT in the interim.   HPI Discussed the use of AI scribe software for clinical note transcription with the patient, who gave verbal consent to proceed.  History of Present Illness   Amy Vang is a 78 year old female with asthma who presents with persistent cough and reflux symptoms.  She has a persistent cough with occasional expectoration of thick, white sputum, which is not discolored. She uses a Breo inhaler every morning but does not notice a significant difference compared to her previous inhaler (Arnuity).  She experienced hoarseness for about a month, which has since resolved. An ENT evaluation revealed raw and swollen vocal cords.  This was likely mediated by reflux.  She describes epigastric pain, which she refers to as 'nauseous,' particularly severe yesterday, though she feels better today after not eating. She has a history of reflux and attempted to change her reflux medication, but the change was not approved.  She reports experiencing headaches that had previously resolved after a carotid artery procedure but have recently returned.  She had a recent blood workup that showed her blood count had improved from a previous state of anemia.  She had blood work done yesterday at the hospital.  She received a flu shot on September 30th.     Review of Systems A 10 point review of systems was performed and it is as noted above otherwise negative.   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   Abnormal vaginal bleeding 12/17/2021   Allergic rhinitis 12/17/2021   Adjustment disorder 12/17/2021   Cholelithiasis without obstruction 12/17/2021   Constipation 12/17/2021   Degeneration of lumbar intervertebral disc 12/17/2021   Diverticulosis of sigmoid colon 12/17/2021   Fatty liver 12/17/2021   Functional abdominal pain syndrome 12/17/2021   Gastro-esophageal reflux disease without esophagitis 12/17/2021   Hypo-osmolality and hyponatremia 12/17/2021   Internal hemorrhoids 12/17/2021   Irritable bowel syndrome without diarrhea 12/17/2021   Lymphocytosis 12/17/2021   Nausea and vomiting 12/17/2021   Overactive bladder 12/17/2021   History of colonic polyps 12/17/2021   Personal history of transient ischemic attack (TIA), and cerebral infarction without residual deficits 12/17/2021   Insomnia 12/17/2021   Pure hypercholesterolemia 12/17/2021   Restless legs syndrome 12/17/2021   Rosacea 12/17/2021   Sleep-wake schedule disorder, 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   Piriformis syndrome of right side 01/01/2021   Lower limb pain, inferior, right 09/19/2020   Lumbosacral radiculitis 09/19/2020   Body mass index (BMI) 25.0-25.9, adult 06/17/2020   Multiple thyroid  nodules 03/11/2020  Annular tear of lumbar disc 01/24/2020   Acute back pain with sciatica 12/28/2019   Encounter for orthopedic follow-up care 01/17/2019   Partial thickness rotator cuff tear 11/07/2018   Acquired trigger finger 10/28/2018   Pain in joint of right shoulder 05/06/2018   Hoarseness  12/22/2017   Cervical spondylosis with radiculopathy 10/25/2017   Neck pain 08/31/2017   Other cervical disc displacement, unspecified cervical region 08/31/2017   Sinusitis, chronic 11/18/2016   Deviated septum 09/18/2016   Dyspnea and respiratory abnormality 05/21/2016   Wheeze 05/21/2016   Chronic throat clearing 05/21/2016   Pulmonary HTN (HCC) 04/23/2016   Moderate persistent asthma 11/12/2015   Allergic rhinoconjunctivitis 11/12/2015   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   SOB (shortness of breath) 03/06/2015   OSA (obstructive sleep apnea) 01/01/2014   Benign essential HTN 01/01/2014   Obesity 01/01/2014    Social History   Tobacco Use   Smoking status: Never   Smokeless tobacco: Never  Substance Use Topics   Alcohol use: No    Allergies  Allergen Reactions   Bactrim [Sulfamethoxazole-Trimethoprim] Swelling    Current Meds  Medication Sig   acetaminophen  (TYLENOL ) 650 MG CR tablet Take 650 mg by mouth every 8 (eight) hours as needed for pain.   albuterol  (VENTOLIN  HFA) 108 (90 Base) MCG/ACT inhaler TAKE 2 PUFFS BY MOUTH EVERY 6 HOURS AS NEEDED FOR WHEEZE OR SHORTNESS OF BREATH   cetirizine (ZYRTEC) 10 MG tablet Take 10 mg by mouth daily.   citalopram  (CELEXA ) 10 MG tablet Take 10 mg by mouth daily.   clonazePAM (KLONOPIN) 0.5 MG tablet Take 0.5 mg by mouth at bedtime.   clopidogrel (PLAVIX) 75 MG tablet Take 75 mg by mouth daily.   cyclobenzaprine  (FLEXERIL ) 10 MG tablet Take 1 tablet (10 mg total) by mouth 3 (three) times daily as needed for muscle spasms.   dicyclomine  (BENTYL ) 10 MG capsule TAKE 1 CAPSULE (10 MG TOTAL) BY MOUTH 4 (FOUR) TIMES DAILY AS NEEDED FOR SPASMS.   famotidine  (PEPCID ) 40 MG tablet Take 40 mg by mouth at bedtime.   fexofenadine (ALLEGRA) 180 MG tablet Take 180 mg by mouth daily.   fluticasone  (FLONASE ) 50 MCG/ACT nasal spray Place 1 spray into both nostrils daily as needed for allergies or rhinitis.   fluticasone  furoate-vilanterol (BREO ELLIPTA )  200-25 MCG/ACT AEPB Inhale 1 puff into the lungs daily.   ipratropium (ATROVENT ) 0.03 % nasal spray Place 2 sprays into both nostrils every 12 (twelve) hours.   losartan  (COZAAR ) 100 MG tablet Take 100 mg by mouth daily.   methocarbamol (ROBAXIN) 500 MG tablet Take 500 mg by mouth every 8 (eight) hours as needed for muscle spasms.   ondansetron  (ZOFRAN ) 4 MG tablet TAKE 1 TABLET (4 MG TOTAL) BY MOUTH AS NEEDED FOR NAUSEA OR VOMITING.   pantoprazole  (PROTONIX ) 40 MG tablet Take 40 mg by mouth daily.   Probiotic Product (ALIGN) 10 MG CAPS Take 1 capsule by mouth daily.   promethazine  (PHENERGAN ) 25 MG tablet TAKE 1 TABLET BY MOUTH EVERY 6 HOURS AS NEEDED   sodium chloride  (OCEAN) 0.65 % SOLN nasal spray Place 1 spray into both nostrils as needed for congestion.   tamoxifen  (NOLVADEX ) 10 MG tablet Take 1 tablet (10 mg total) by mouth daily.   traMADol  (ULTRAM ) 50 MG tablet Take 1 tablet (50 mg total) by mouth every 6 (six) hours as needed for moderate pain or severe pain.   traZODone  (DESYREL ) 50 MG tablet Take 50 mg by mouth at bedtime.  Immunization History  Administered Date(s) Administered   Fluad Quad(high Dose 65+) 08/30/2022, 08/01/2023   Fluzone Influenza virus vaccine,trivalent (IIV3), split virus 09/17/2010, 08/11/2019, 01/29/2021, 08/30/2022, 09/07/2023   INFLUENZA, HIGH DOSE SEASONAL PF 09/05/2015, 09/01/2016, 09/06/2017, 10/11/2018, 08/18/2019, 08/31/2019, 12/27/2020   Influenza Split 09/03/2009, 09/03/2012, 08/22/2015   Influenza,inj,Quad PF,6+ Mos 08/30/2016   Influenza-Unspecified 08/26/2017   PFIZER(Purple Top)SARS-COV-2 Vaccination 12/23/2019, 01/13/2020, 04/26/2020, 08/27/2020, 05/06/2021   PNEUMOCOCCAL CONJUGATE-20 11/03/2023   Pfizer(Comirnaty)Fall Seasonal Vaccine 12 years and older 01/06/2022   Pneumococcal Conjugate-13 12/13/2013, 09/01/2016   Pneumococcal Polysaccharide-23 09/28/2011, 09/01/2016, 12/27/2020, 09/23/2021   Respiratory Syncytial Virus Vaccine,Recomb  Aduvanted(Arexvy) 12/14/2023   Tdap 03/07/2007, 09/08/2018   Zoster Recombinant(Shingrix) 11/09/2017   Zoster, Live 03/28/2008, 08/20/2017, 11/09/2017        Objective:     BP 124/60   Pulse 69   Temp 97.9 F (36.6 C) (Temporal)   Ht 5' 4 (1.626 m)   Wt 170 lb (77.1 kg)   SpO2 96%   BMI 29.18 kg/m   SpO2: 96 %  GENERAL: Well-developed, somewhat overweight woman, no acute distress, fully ambulatory.  No conversational dyspnea. HEAD: Normocephalic, atraumatic.  EYES: Pupils equal, round, reactive to light.  No scleral icterus.  MOUTH: Teeth intact, oral mucosa moist.  No thrush.  NECK: Supple. No thyromegaly. Trachea midline. No JVD.  No adenopathy. PULMONARY: Good air entry bilaterally.  No adventitious sounds. CARDIOVASCULAR: S1 and S2. Regular rate and rhythm.  No rubs, murmurs or gallops heard. ABDOMEN: Benign. MUSCULOSKELETAL: No joint deformity, no clubbing, no edema.  NEUROLOGIC: No overt focal deficit, no gait disturbance, speech is fluent. SKIN: Intact,warm,dry. PSYCH: Mood and behavior normal  Lab Results  Component Value Date   NITRICOXIDE 23 09/07/2024  *Low level of nitric oxide  consistent with low level of type II inflammation (good asthma control).       Assessment & Plan:     ICD-10-CM   1. Moderate persistent asthma without complication  J45.40 Nitric oxide     Pulmonary function test    2. Gastroesophageal reflux disease, unspecified whether esophagitis present  K21.9     3. Laryngopharyngeal reflux (LPR)  K21.9     4. Chronic cough  R05.3 Pulmonary function test     Orders Placed This Encounter  Procedures   Nitric oxide    Pulmonary function test    Standing Status:   Future    Expiration Date:   09/07/2025    Where should this test be performed?:   Outpatient Pulmonary    What type of PFT is being ordered?:   Full PFT   Meds ordered this encounter  Medications   Tiotropium Bromide (SPIRIVA RESPIMAT) 1.25 MCG/ACT AERS    Sig: Inhale  2 puffs into the lungs daily.    Dispense:  8 g    Refill:  0    Lot Number?:   [id number]    Expiration Date?:   01/28/2026    Quantity:   2   Discussion:    Asthma with chronic cough Chronic cough with intermittent production of thick, white sputum, likely related to asthma and possibly exacerbated by GERD. Previous pulmonary function tests showed significant asthmatic changes. Current symptoms may also be influenced by a recent viral infection. Breathing is clear today, indicating improvement. - Add Spiriva inhaler, two puffs once daily after Breo, to manage cough and asthma symptoms - Provide samples of Spiriva for trial use - Order repeat pulmonary function test to assess current asthma status  Gastroesophageal reflux disease (GERD) GERD  symptoms include epigastric pain and nausea, with possible contribution to chronic cough. Previous attempts to change reflux medication were unsuccessful due to insurance issues. Recent blood work showed low hemoglobin, which has since improved, suggesting possible inflammation. Reflux may have worsened due to a viral infection, contributing to hoarseness and cough. - Patient to make appointment with her gastroenterologist Dr. Avram for further evaluation and management of GERD - Advise her to report epigastric pain and reflux symptoms to Dr. Avram  Anemia, unspecified Previous blood work indicated anemia, but recent tests show improvement in hemoglobin levels.   Advised if symptoms do not improve or worsen, to please contact office for sooner follow up or seek emergency care.    I spent 32 minutes of dedicated to the care of this patient on the date of this encounter to include pre-visit review of records, face-to-face time with the patient discussing conditions above, post visit ordering of testing, clinical documentation with the electronic health record, making appropriate referrals as documented, and communicating necessary findings to members of  the patients care team.     C. Leita Sanders, MD Advanced Bronchoscopy P[facility]    *This note was generated using voice recognition software/Dragon and/or AI transcription program.  Despite best efforts to proofread, errors can occur which can change the meaning. Any transcriptional errors that result from this process are unintentional and may not be fully corrected at the time of dictation.

## 2024-09-07 NOTE — Patient Instructions (Signed)
 VISIT SUMMARY:  Today, we discussed your persistent cough, reflux symptoms, and recent headaches. We reviewed your asthma management and made some adjustments to your treatment plan. We also talked about your GERD symptoms and the improvement in your anemia. You received a flu shot recently, and your breathing is clear today, indicating improvement.  YOUR PLAN:  -ASTHMA WITH CHRONIC COUGH: Asthma is a condition where your airways narrow and swell, producing extra mucus, which can make breathing difficult and trigger coughing. To help manage your cough and asthma symptoms, we are adding a Spiriva inhaler to your daily routine. You should use two puffs of Spiriva once daily after using your Breo inhaler. We have provided samples of Spiriva for you to try. Additionally, we will repeat your pulmonary function test to assess your current asthma status.  -GASTROESOPHAGEAL REFLUX DISEASE (GERD): GERD is a digestive disorder where stomach acid frequently flows back into the tube connecting your mouth and stomach, causing irritation. Your symptoms include epigastric pain and nausea, which may also be contributing to your chronic cough. We are referring you to Dr. Avram, a gastroenterologist, for further evaluation and management of your GERD. Please report any epigastric pain and reflux symptoms to Dr. Avram.  -ANEMIA, UNSPECIFIED: Anemia is a condition where you lack enough healthy red blood cells to carry adequate oxygen to your body's tissues. Your recent blood work shows that your hemoglobin levels have improved, indicating that your anemia is getting better.  INSTRUCTIONS:  Please follow up with Dr. Avram for your GERD symptoms and report any epigastric pain and reflux symptoms to him. We will also schedule a repeat pulmonary function test to assess your asthma status.

## 2024-09-12 DIAGNOSIS — R0789 Other chest pain: Secondary | ICD-10-CM | POA: Diagnosis not present

## 2024-09-12 DIAGNOSIS — F419 Anxiety disorder, unspecified: Secondary | ICD-10-CM | POA: Diagnosis not present

## 2024-09-12 DIAGNOSIS — R519 Headache, unspecified: Secondary | ICD-10-CM | POA: Diagnosis not present

## 2024-09-12 DIAGNOSIS — G2581 Restless legs syndrome: Secondary | ICD-10-CM | POA: Diagnosis not present

## 2024-09-12 DIAGNOSIS — K219 Gastro-esophageal reflux disease without esophagitis: Secondary | ICD-10-CM | POA: Diagnosis not present

## 2024-09-12 DIAGNOSIS — R0781 Pleurodynia: Secondary | ICD-10-CM | POA: Diagnosis not present

## 2024-09-12 DIAGNOSIS — G4733 Obstructive sleep apnea (adult) (pediatric): Secondary | ICD-10-CM | POA: Diagnosis not present

## 2024-09-12 DIAGNOSIS — G4700 Insomnia, unspecified: Secondary | ICD-10-CM | POA: Diagnosis not present

## 2024-09-12 DIAGNOSIS — E7800 Pure hypercholesterolemia, unspecified: Secondary | ICD-10-CM | POA: Diagnosis not present

## 2024-09-12 DIAGNOSIS — D0511 Intraductal carcinoma in situ of right breast: Secondary | ICD-10-CM | POA: Diagnosis not present

## 2024-09-12 DIAGNOSIS — M8580 Other specified disorders of bone density and structure, unspecified site: Secondary | ICD-10-CM | POA: Diagnosis not present

## 2024-09-12 DIAGNOSIS — N3281 Overactive bladder: Secondary | ICD-10-CM | POA: Diagnosis not present

## 2024-09-12 DIAGNOSIS — I272 Pulmonary hypertension, unspecified: Secondary | ICD-10-CM | POA: Diagnosis not present

## 2024-09-21 DIAGNOSIS — I10 Essential (primary) hypertension: Secondary | ICD-10-CM | POA: Diagnosis not present

## 2024-09-29 ENCOUNTER — Encounter: Payer: Self-pay | Admitting: Pulmonary Disease

## 2024-09-29 DIAGNOSIS — I10 Essential (primary) hypertension: Secondary | ICD-10-CM | POA: Diagnosis not present

## 2024-09-29 DIAGNOSIS — D0511 Intraductal carcinoma in situ of right breast: Secondary | ICD-10-CM | POA: Diagnosis not present

## 2024-09-29 DIAGNOSIS — E7800 Pure hypercholesterolemia, unspecified: Secondary | ICD-10-CM | POA: Diagnosis not present

## 2024-09-29 DIAGNOSIS — J4540 Moderate persistent asthma, uncomplicated: Secondary | ICD-10-CM | POA: Diagnosis not present

## 2024-09-29 MED ORDER — PREDNISONE 20 MG PO TABS: 20.0000 mg | ORAL_TABLET | Freq: Every day | ORAL | 0 refills | Status: AC

## 2024-09-29 MED ORDER — AZITHROMYCIN 250 MG PO TABS: ORAL_TABLET | ORAL | 0 refills | Status: AC

## 2024-09-29 NOTE — Telephone Encounter (Signed)
 I have sent prescriptions for azithromycin  and prednisone  to her pharmacy.  She can also use Mucinex DM over-the-counter to help with the cough and sputum production.

## 2024-10-02 ENCOUNTER — Telehealth: Payer: Self-pay | Admitting: Neurology

## 2024-10-02 NOTE — Telephone Encounter (Signed)
 Patient said having a headache with pain level a 8. PCP advised to call and see if can be seen sooner. Would like a call back.

## 2024-10-06 ENCOUNTER — Ambulatory Visit: Admitting: Pulmonary Disease

## 2024-10-09 ENCOUNTER — Ambulatory Visit: Admitting: Pulmonary Disease

## 2024-10-09 ENCOUNTER — Encounter: Payer: Self-pay | Admitting: Pulmonary Disease

## 2024-10-09 NOTE — Telephone Encounter (Signed)
 She could try to rechallenge with the Spiriva.  It was actually the lower dose of Spiriva that I provided her with.  If her headaches return then stop the medicine.  Continue using the Breo like she is doing.

## 2024-10-15 ENCOUNTER — Encounter: Payer: Self-pay | Admitting: Pulmonary Disease

## 2024-10-16 MED ORDER — FLUTICASONE FUROATE-VILANTEROL 200-25 MCG/ACT IN AEPB: 1.0000 | INHALATION_SPRAY | Freq: Every day | RESPIRATORY_TRACT | 3 refills | Status: DC

## 2024-10-16 MED ORDER — SPIRIVA RESPIMAT 1.25 MCG/ACT IN AERS: 2.0000 | INHALATION_SPRAY | Freq: Every day | RESPIRATORY_TRACT | 3 refills | Status: AC

## 2024-10-17 NOTE — Telephone Encounter (Signed)
 Okay to give her 1 sample.

## 2024-10-18 ENCOUNTER — Other Ambulatory Visit: Payer: Self-pay | Admitting: Pulmonary Disease

## 2024-10-18 DIAGNOSIS — J4540 Moderate persistent asthma, uncomplicated: Secondary | ICD-10-CM

## 2024-10-18 DIAGNOSIS — R053 Chronic cough: Secondary | ICD-10-CM

## 2024-10-21 DIAGNOSIS — I10 Essential (primary) hypertension: Secondary | ICD-10-CM | POA: Diagnosis not present

## 2024-10-29 DIAGNOSIS — D0511 Intraductal carcinoma in situ of right breast: Secondary | ICD-10-CM | POA: Diagnosis not present

## 2024-10-29 DIAGNOSIS — E7800 Pure hypercholesterolemia, unspecified: Secondary | ICD-10-CM | POA: Diagnosis not present

## 2024-10-29 DIAGNOSIS — J4540 Moderate persistent asthma, uncomplicated: Secondary | ICD-10-CM | POA: Diagnosis not present

## 2024-10-29 DIAGNOSIS — I10 Essential (primary) hypertension: Secondary | ICD-10-CM | POA: Diagnosis not present

## 2024-11-03 DIAGNOSIS — R300 Dysuria: Secondary | ICD-10-CM | POA: Diagnosis not present

## 2024-11-08 ENCOUNTER — Ambulatory Visit (HOSPITAL_COMMUNITY): Admission: RE | Admit: 2024-11-08 | Discharge: 2024-11-08 | Attending: Neuroradiology | Admitting: Neuroradiology

## 2024-11-08 ENCOUNTER — Ambulatory Visit (INDEPENDENT_AMBULATORY_CARE_PROVIDER_SITE_OTHER): Admitting: Neuroradiology

## 2024-11-08 ENCOUNTER — Encounter: Payer: Self-pay | Admitting: Neuroradiology

## 2024-11-08 VITALS — BP 153/79 | HR 74 | Temp 97.3°F | Ht 64.0 in | Wt 169.0 lb

## 2024-11-08 DIAGNOSIS — Z09 Encounter for follow-up examination after completed treatment for conditions other than malignant neoplasm: Secondary | ICD-10-CM

## 2024-11-08 DIAGNOSIS — Z95828 Presence of other vascular implants and grafts: Secondary | ICD-10-CM

## 2024-11-08 DIAGNOSIS — H93A2 Pulsatile tinnitus, left ear: Secondary | ICD-10-CM

## 2024-11-08 DIAGNOSIS — H9319 Tinnitus, unspecified ear: Secondary | ICD-10-CM

## 2024-11-08 NOTE — Progress Notes (Signed)
 I had the pleasure of seeing Amy Vang for follow-up today.  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  She has had no symptoms of stroke or TIA since I last saw her.  Her ultrasound today shows no restenosis within the stent.  She is continuing to have the audible thumping sound, which really did not change after the stent placement.  I do not think further investigations are likely to reveal a cause for her symptoms.  I have reassured her that while the noise is bothersome, it is not dangerous.  She can stop taking the clopidogrel at this point.  She can continue with the 81 mg aspirin .  The aspirin  can be stopped at any time as needed for medical or dental procedures.  I have not scheduled any routine follow-up with her.  I am glad to see her again at any time as needed.

## 2024-11-09 ENCOUNTER — Ambulatory Visit

## 2024-11-09 ENCOUNTER — Ambulatory Visit: Admitting: Pulmonary Disease

## 2024-11-09 ENCOUNTER — Encounter: Payer: Self-pay | Admitting: Pulmonary Disease

## 2024-11-09 VITALS — BP 138/60 | HR 67 | Temp 98.1°F | Ht 64.0 in | Wt 172.0 lb

## 2024-11-09 DIAGNOSIS — J45991 Cough variant asthma: Secondary | ICD-10-CM | POA: Diagnosis not present

## 2024-11-09 DIAGNOSIS — K219 Gastro-esophageal reflux disease without esophagitis: Secondary | ICD-10-CM

## 2024-11-09 DIAGNOSIS — J4540 Moderate persistent asthma, uncomplicated: Secondary | ICD-10-CM

## 2024-11-09 DIAGNOSIS — R053 Chronic cough: Secondary | ICD-10-CM

## 2024-11-09 LAB — PULMONARY FUNCTION TEST
DL/VA % pred: 100 %
DL/VA: 4.15 ml/min/mmHg/L
DLCO unc % pred: 91 %
DLCO unc: 17.23 ml/min/mmHg
FEF 25-75 Post: 1.73 L/s
FEF 25-75 Pre: 1.71 L/s
FEF2575-%Change-Post: 1 %
FEF2575-%Pred-Post: 111 %
FEF2575-%Pred-Pre: 109 %
FEV1-%Change-Post: -1 %
FEV1-%Pred-Post: 104 %
FEV1-%Pred-Pre: 106 %
FEV1-Post: 2.14 L
FEV1-Pre: 2.18 L
FEV1FVC-%Change-Post: -3 %
FEV1FVC-%Pred-Pre: 104 %
FEV6-%Change-Post: 1 %
FEV6-%Pred-Post: 109 %
FEV6-%Pred-Pre: 108 %
FEV6-Post: 2.85 L
FEV6-Pre: 2.81 L
FEV6FVC-%Change-Post: 0 %
FEV6FVC-%Pred-Post: 105 %
FEV6FVC-%Pred-Pre: 105 %
FVC-%Change-Post: 1 %
FVC-%Pred-Post: 104 %
FVC-%Pred-Pre: 102 %
FVC-Post: 2.86 L
FVC-Pre: 2.81 L
Post FEV1/FVC ratio: 75 %
Post FEV6/FVC ratio: 100 %
Pre FEV1/FVC ratio: 78 %
Pre FEV6/FVC Ratio: 100 %
RV % pred: 86 %
RV: 2.01 L
TLC % pred: 96 %
TLC: 4.85 L

## 2024-11-09 LAB — NITRIC OXIDE: Nitric Oxide: 26

## 2024-11-09 NOTE — Progress Notes (Signed)
 Full PFT completed today ? ?

## 2024-11-09 NOTE — Patient Instructions (Signed)
 VISIT SUMMARY:  Today, you came in because of an increased cough over the past two days. You believe this might be due to a recent upper respiratory infection from your grandson. Your asthma is well-managed with your current medications, and you recently finished a course of Macrobid for a urinary tract infection.  YOUR PLAN:  -COUGH VARIANT ASTHMA: Cough variant asthma is a type of asthma where the main symptom is a dry, non-productive cough. Your asthma is well-controlled with your current medications, Breo and Spiriva , and your lung function is normal. The recent cough is likely a side effect of the Macrobid you took for your urinary tract infection. Please continue with your current asthma medications as prescribed.  INSTRUCTIONS:  Please continue taking your current asthma medications, Breo and Spiriva . We have scheduled a follow-up appointment for you in three months.

## 2024-11-09 NOTE — Patient Instructions (Signed)
 Full PFT completed today ? ?

## 2024-11-09 NOTE — Progress Notes (Unsigned)
 Subjective:    Patient ID: Amy Vang, female    DOB: 1946-10-03, 78 y.o.   MRN: [id number]  Patient Care Team: Claudene Pellet, MD as PCP - General (Family Medicine) Melanee Annah BROCKS, MD as Consulting Physician (Oncology)  Chief Complaint  Patient presents with   Asthma    Occasional shortness of breath on exertion. Cough. No wheezing.     BACKGROUND/INTERVAL:Amy Vang is a 78 year old lifelong never smoker who presents for follow-up on the issue of cough, hoarseness and wheezing that occurs 2-3 times per year.  She has moderate persistent asthma and chronic rhinosinusitis.  She was seen on 03 July 2024 as an ACUTE visit.  She was  switched to Breo Ellipta  200.  Last follow-up was 07 September 2024.  Patient had PFTs today.   HPI Discussed the use of AI scribe software for clinical note transcription with the patient, who gave verbal consent to proceed.  History of Present Illness   Amy Vang is a 78 year old female with asthma who presents with increased coughing.  She has experienced increased coughing over the past two days, which she describes as mild and not noticeable to others. She attributes the onset of the cough to a recent upper respiratory infection, likely contracted from her grandson who was coughing last weekend. No recent episodes of bronchitis or other respiratory infections have occurred.  Her asthma is managed with Spiriva  1.25 and Breo 200, which she finds effective.  She recently completed a course of Macrobid for a urinary tract infection, finishing the last dose today.  It was mentioned to her to discuss with her urologist that this medication can cause significant pulmonary side effects to include cough and pneumonitis.  The potential correlation between her increasing coughing over the past 2 days and the Macrobid was made.  She is mindful of not taking medications that could impair her ability to drive, as she needs to pick up her grandson.      PFTs performed today were normal showing an FEV1 of 2.19 L or 106% predicted, FVC of 2.81 L or 102% predicted, FEV1/FVC of 78%, no bronchodilator response.  Lung volumes normal.  Diffusion capacity normal.   Review of Systems A 10 point review of systems was performed and it is as noted above otherwise negative.   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   Abnormal vaginal bleeding 12/17/2021   Allergic rhinitis 12/17/2021   Adjustment disorder 12/17/2021   Cholelithiasis without obstruction 12/17/2021   Constipation 12/17/2021   Degeneration of lumbar intervertebral disc 12/17/2021   Diverticulosis of sigmoid colon 12/17/2021   Fatty liver 12/17/2021   Functional abdominal pain syndrome 12/17/2021   Gastro-esophageal reflux disease without esophagitis 12/17/2021   Hypo-osmolality and hyponatremia 12/17/2021   Internal hemorrhoids 12/17/2021   Irritable bowel syndrome without diarrhea 12/17/2021   Lymphocytosis 12/17/2021   Nausea and vomiting 12/17/2021   Overactive bladder 12/17/2021   History of colonic polyps 12/17/2021   Personal history of transient ischemic attack (TIA), and cerebral infarction without residual deficits 12/17/2021   Insomnia 12/17/2021   Pure hypercholesterolemia 12/17/2021   Restless legs syndrome 12/17/2021   Rosacea 12/17/2021   Sleep-wake schedule disorder, 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   Piriformis syndrome of right side 01/01/2021   Lower limb pain, inferior, right 09/19/2020  Lumbosacral radiculitis 09/19/2020   Body mass index (BMI) 25.0-25.9, adult 06/17/2020   Multiple thyroid  nodules 03/11/2020   Annular tear of lumbar disc 01/24/2020   Acute back pain with sciatica 12/28/2019   Encounter for orthopedic follow-up care 01/17/2019   Partial thickness rotator cuff  tear 11/07/2018   Acquired trigger finger 10/28/2018   Pain in joint of right shoulder 05/06/2018   Hoarseness 12/22/2017   Cervical spondylosis with radiculopathy 10/25/2017   Neck pain 08/31/2017   Other cervical disc displacement, unspecified cervical region 08/31/2017   Sinusitis, chronic 11/18/2016   Deviated septum 09/18/2016   Dyspnea and respiratory abnormality 05/21/2016   Wheeze 05/21/2016   Chronic throat clearing 05/21/2016   Pulmonary HTN (HCC) 04/23/2016   Moderate persistent asthma 11/12/2015   Allergic rhinoconjunctivitis 11/12/2015   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   SOB (shortness of breath) 03/06/2015   OSA (obstructive sleep apnea) 01/01/2014   Benign essential HTN 01/01/2014   Obesity 01/01/2014    Social History   Tobacco Use   Smoking status: Never   Smokeless tobacco: Never  Substance Use Topics   Alcohol use: No    Allergies[1]  Active Medications[2]  Immunization History  Administered Date(s) Administered   Fluad Quad(high Dose 65+) 08/30/2022, 08/01/2023   Fluzone Influenza virus vaccine,trivalent (IIV3), split virus 09/17/2010, 08/11/2019, 01/29/2021, 08/30/2022, 09/07/2023, 08/29/2024   INFLUENZA, HIGH DOSE SEASONAL PF 09/05/2015, 09/01/2016, 09/06/2017, 10/11/2018, 08/18/2019, 08/31/2019, 12/27/2020   Influenza Split 09/03/2009, 09/03/2012, 08/22/2015   Influenza,inj,Quad PF,6+ Mos 08/30/2016   Influenza-Unspecified 08/26/2017   PFIZER(Purple Top)SARS-COV-2 Vaccination 12/23/2019, 01/13/2020, 04/26/2020, 08/27/2020, 05/06/2021   PNEUMOCOCCAL CONJUGATE-20 11/03/2023   Pfizer(Comirnaty)Fall Seasonal Vaccine 12 years and older 01/06/2022   Pneumococcal Conjugate-13 12/13/2013, 09/01/2016   Pneumococcal Polysaccharide-23 09/28/2011, 09/01/2016, 12/27/2020, 09/23/2021   Respiratory Syncytial Virus Vaccine,Recomb Aduvanted(Arexvy) 12/14/2023   Tdap 03/07/2007, 09/08/2018   Zoster Recombinant(Shingrix) 11/09/2017   Zoster, Live 03/28/2008,  08/20/2017, 11/09/2017        Objective:     Vitals:   11/09/24 1359  BP: 138/60  Pulse: 67  Temp: 98.1 F (36.7 C)  Height: 5' 4 (1.626 m)  Weight: 172 lb (78 kg)  SpO2: 98%  TempSrc: Temporal  BMI (Calculated): 29.51     SpO2: 98 %  GENERAL: Well-developed, somewhat overweight woman, no acute distress, fully ambulatory.  No conversational dyspnea. HEAD: Normocephalic, atraumatic.  EYES: Pupils equal, round, reactive to light.  No scleral icterus.  MOUTH: Teeth intact, oral mucosa moist.  No thrush.  NECK: Supple. No thyromegaly. Trachea midline. No JVD.  No adenopathy. PULMONARY: Good air entry bilaterally.  No adventitious sounds. CARDIOVASCULAR: S1 and S2. Regular rate and rhythm.  No rubs, murmurs or gallops heard. ABDOMEN: Benign. MUSCULOSKELETAL: No joint deformity, no clubbing, no edema.  NEUROLOGIC: No overt focal deficit, no gait disturbance, speech is fluent. SKIN: Intact,warm,dry. PSYCH: Mood and behavior normal  Lab Results  Component Value Date   NITRICOXIDE 26 11/09/2024  This result suggests intermediate (25-49) Type 2 (T2) airway inflammation; clinical correlation required.  Recent Results (from the past 2160 hours)  CBC     Status: None   Collection Time: 09/06/24 11:02 AM  Result Value Ref Range   WBC 5.0 4.0 - 10.5 K/uL   RBC 4.04 3.87 - 5.11 MIL/uL   Hemoglobin 12.3 12.0 - 15.0 g/dL   HCT 63.2 63.9 - 53.9 %   MCV 90.8 80.0 - 100.0 fL   MCH 30.4 26.0 - 34.0 pg   MCHC 33.5 30.0 - 36.0 g/dL  RDW 12.5 11.5 - 15.5 %   Platelets 187 150 - 400 K/uL   nRBC 0.0 0.0 - 0.2 %    Comment: Performed at [facility], [address] [place], [place] [address]  C-reactive protein     Status: None   Collection Time: 09/06/24 11:02 AM  Result Value Ref Range   CRP 0.5 <1.0 mg/dL    Comment: Performed at [facility], [address] [place], [place] [address]  Sedimentation rate     Status: None   Collection Time: 09/06/24 11:02 AM   Result Value Ref Range   Sed Rate 1 0 - 30 mm/hr    Comment: Performed at [facility], [address] [place], [place] [address]  Nitric oxide      Status: None   Collection Time: 09/07/24  9:04 AM  Result Value Ref Range   Nitric Oxide  23   Pulmonary function test     Status: None (Preliminary result)   Collection Time: 11/09/24 12:50 PM  Result Value Ref Range   FVC-Pre 2.81 L   FVC-%Pred-Pre 102 %   FVC-Post 2.86 L   FVC-%Pred-Post 104 %   FVC-%Change-Post 1 %   FEV1-Pre 2.18 L   FEV1-%Pred-Pre 106 %   FEV1-Post 2.14 L   FEV1-%Pred-Post 104 %   FEV1-%Change-Post -1 %   FEV6-Pre 2.81 L   FEV6-%Pred-Pre 108 %   FEV6-Post 2.85 L   FEV6-%Pred-Post 109 %   FEV6-%Change-Post 1 %   Pre FEV1/FVC ratio 78 %   FEV1FVC-%Pred-Pre 104 %   Post FEV1/FVC ratio 75 %   FEV1FVC-%Change-Post -3 %   Pre FEV6/FVC Ratio 100 %   FEV6FVC-%Pred-Pre 105 %   Post FEV6/FVC ratio 100 %   FEV6FVC-%Pred-Post 105 %   FEV6FVC-%Change-Post 0 %   FEF 25-75 Pre 1.71 L/sec   FEF2575-%Pred-Pre 109 %   FEF 25-75 Post 1.73 L/sec   FEF2575-%Pred-Post 111 %   FEF2575-%Change-Post 1 %   RV 2.01 L   RV % pred 86 %   TLC 4.85 L   TLC % pred 96 %   DLCO unc 17.23 ml/min/mmHg   DLCO unc % pred 91 %   DL/VA 5.84 ml/min/mmHg/L   DL/VA % pred 899 %  *Discussed PFT results with the patient.     Assessment & Plan:     ICD-10-CM   1. Moderate persistent asthma without complication  J45.40     2. Gastroesophageal reflux disease, unspecified whether esophagitis present  K21.9     3. Laryngopharyngeal reflux (LPR)  K21.9       Orders Placed This Encounter  Procedures   Nitric oxide    Discussion:    Cough variant asthma Well-controlled with current medication regimen. Lung function is normal, and inflammation levels are reduced. Recent cough likely due to recent UTI treatment with Macrobid, which can cause cough as a side effect. No recent episodes of bronchitis or other respiratory  infections. - Continue current asthma medications: Breo and Spiriva . - Scheduled follow-up appointment in three months.     Discussed potential issues with Macrobid and potential lung toxicity with the patient.  I have urged her to talk to her urologist about these issues.  Follow-up in 3 months time.  Advised if symptoms do not improve or worsen, to please contact office for sooner follow up or seek emergency care.    I spent 32 minutes of dedicated to the care of this patient on the date of this encounter  to include pre-visit review of records, face-to-face time with the patient discussing conditions above, post visit ordering of testing, clinical documentation with the electronic health record, making appropriate referrals as documented, and communicating necessary findings to members of the patients care team.     C. Leita Sanders, MD Advanced Bronchoscopy P[facility]    *This note was generated using voice recognition software/Dragon and/or AI transcription program.  Despite best efforts to proofread, errors can occur which can change the meaning. Any transcriptional errors that result from this process are unintentional and may not be fully corrected at the time of dictation.     [1]  Allergies Allergen Reactions   Bactrim [Sulfamethoxazole-Trimethoprim] Swelling  [2]  Current Meds  Medication Sig   acetaminophen  (TYLENOL ) 650 MG CR tablet Take 650 mg by mouth every 8 (eight) hours as needed for pain.   albuterol  (VENTOLIN  HFA) 108 (90 Base) MCG/ACT inhaler TAKE 2 PUFFS BY MOUTH EVERY 6 HOURS AS NEEDED FOR WHEEZE OR SHORTNESS OF BREATH   cetirizine (ZYRTEC) 10 MG tablet Take 10 mg by mouth daily.   citalopram  (CELEXA ) 10 MG tablet Take 10 mg by mouth daily.   clonazePAM (KLONOPIN) 0.5 MG tablet Take 0.5 mg by mouth at bedtime.   cyclobenzaprine  (FLEXERIL ) 10 MG tablet Take 1 tablet (10 mg total) by mouth 3 (three) times daily as needed for muscle  spasms.   dicyclomine  (BENTYL ) 10 MG capsule TAKE 1 CAPSULE (10 MG TOTAL) BY MOUTH 4 (FOUR) TIMES DAILY AS NEEDED FOR SPASMS.   famotidine  (PEPCID ) 40 MG tablet Take 40 mg by mouth at bedtime.   fexofenadine (ALLEGRA) 180 MG tablet Take 180 mg by mouth daily.   fluticasone  (FLONASE ) 50 MCG/ACT nasal spray Place 1 spray into both nostrils daily as needed for allergies or rhinitis.   fluticasone  furoate-vilanterol (BREO ELLIPTA ) 200-25 MCG/ACT AEPB Inhale 1 puff into the lungs daily.   ipratropium (ATROVENT ) 0.03 % nasal spray Place 2 sprays into both nostrils every 12 (twelve) hours.   losartan  (COZAAR ) 100 MG tablet Take 100 mg by mouth daily.   methocarbamol (ROBAXIN) 500 MG tablet Take 500 mg by mouth every 8 (eight) hours as needed for muscle spasms.   ondansetron  (ZOFRAN ) 4 MG tablet TAKE 1 TABLET (4 MG TOTAL) BY MOUTH AS NEEDED FOR NAUSEA OR VOMITING.   pantoprazole  (PROTONIX ) 40 MG tablet Take 40 mg by mouth daily.   Probiotic Product (ALIGN) 10 MG CAPS Take 1 capsule by mouth daily.   promethazine  (PHENERGAN ) 25 MG tablet TAKE 1 TABLET BY MOUTH EVERY 6 HOURS AS NEEDED   sodium chloride  (OCEAN) 0.65 % SOLN nasal spray Place 1 spray into both nostrils as needed for congestion.   tamoxifen  (NOLVADEX ) 10 MG tablet Take 1 tablet (10 mg total) by mouth daily.   Tiotropium Bromide (SPIRIVA  RESPIMAT) 1.25 MCG/ACT AERS Inhale 2 puffs into the lungs daily.   traMADol  (ULTRAM ) 50 MG tablet Take 1 tablet (50 mg total) by mouth every 6 (six) hours as needed for moderate pain or severe pain.   traZODone  (DESYREL ) 50 MG tablet Take 50 mg by mouth at bedtime.

## 2024-11-10 ENCOUNTER — Encounter: Payer: Self-pay | Admitting: Pulmonary Disease

## 2024-11-13 ENCOUNTER — Ambulatory Visit: Payer: Self-pay | Admitting: Pulmonary Disease

## 2024-11-22 ENCOUNTER — Other Ambulatory Visit: Payer: Self-pay | Admitting: Internal Medicine

## 2024-11-22 DIAGNOSIS — K219 Gastro-esophageal reflux disease without esophagitis: Secondary | ICD-10-CM

## 2024-11-28 ENCOUNTER — Ambulatory Visit: Admitting: Neurology

## 2024-11-28 ENCOUNTER — Encounter: Payer: Self-pay | Admitting: Neurology

## 2024-11-28 VITALS — BP 160/78 | HR 83 | Ht 64.0 in | Wt 170.5 lb

## 2024-11-28 DIAGNOSIS — G4733 Obstructive sleep apnea (adult) (pediatric): Secondary | ICD-10-CM | POA: Diagnosis not present

## 2024-11-28 DIAGNOSIS — G43009 Migraine without aura, not intractable, without status migrainosus: Secondary | ICD-10-CM | POA: Diagnosis not present

## 2024-11-28 DIAGNOSIS — H93A2 Pulsatile tinnitus, left ear: Secondary | ICD-10-CM

## 2024-11-28 DIAGNOSIS — I10 Essential (primary) hypertension: Secondary | ICD-10-CM | POA: Diagnosis not present

## 2024-11-28 NOTE — Progress Notes (Signed)
 "   [facility]  PATIENT: Amy Vang DOB: Feb 21, 1946  REQUESTING CLINICIAN: Claudene Pellet, MD HISTORY FROM: Patient and daughter  REASON FOR VISIT: Left sided headaches    HISTORICAL  CHIEF COMPLAINT:  Chief Complaint  Patient presents with   New Patient (Initial Visit)    Room12 With Daughter Chronic Headaches     HISTORY OF PRESENT ILLNESS:  This is a 78 year old woman with history of hypertension, anxiety, obstructive sleep apnea, GERD who is presenting with her daughter with complaint of left-sided headache.  Patient reports experiencing pulsatile tinnitus and left sided headaches. She met Dr. Dolphus in April and there was a plan for angiogram.  The angiogram show a cervical web and she underwent a placement of a pipeline stent in the left internal carotid.  She was initially on ticagrelor  but she was not able to tolerate it therefore she was switched to Plavix for about 6 months currently she is on aspirin  alone.  She tells me right after the placement of the stent her headaches got better but now she is having a left-sided headache about twice a week that she describes as throbbing pulsatile pain.  The headaches are associated with photophobia and phonophobia but no nausea, no vomiting.  She has to go into a dark place and rest.  She is still experiencing the pulsatile tinnitus at night.  She has seen Dr. Lester for follow-up and the plan is to continue with lifelong aspirin .      OTHER MEDICAL CONDITIONS: Hypertension, anxiety,    REVIEW OF SYSTEMS: Full 14 system review of systems performed and negative with exception of: As noted in the HPI   ALLERGIES: Allergies[1]  HOME MEDICATIONS: Outpatient Medications Prior to Visit  Medication Sig Dispense Refill   acetaminophen  (TYLENOL ) 650 MG CR tablet Take 650 mg by mouth every 8 (eight) hours as needed for pain.     albuterol  (VENTOLIN  HFA) 108 (90 Base) MCG/ACT inhaler TAKE 2 PUFFS BY MOUTH  EVERY 6 HOURS AS NEEDED FOR WHEEZE OR SHORTNESS OF BREATH 6.7 each 4   citalopram  (CELEXA ) 10 MG tablet Take 10 mg by mouth daily.     clonazePAM (KLONOPIN) 0.5 MG tablet Take 0.5 mg by mouth at bedtime.     cyclobenzaprine  (FLEXERIL ) 10 MG tablet Take 1 tablet (10 mg total) by mouth 3 (three) times daily as needed for muscle spasms. 50 tablet 1   dicyclomine  (BENTYL ) 10 MG capsule TAKE 1 CAPSULE (10 MG TOTAL) BY MOUTH 4 (FOUR) TIMES DAILY AS NEEDED FOR SPASMS. 120 capsule 0   famotidine  (PEPCID ) 40 MG tablet Take 40 mg by mouth at bedtime.     fexofenadine (ALLEGRA) 180 MG tablet Take 180 mg by mouth daily.     fluticasone  (FLONASE ) 50 MCG/ACT nasal spray Place 1 spray into both nostrils daily as needed for allergies or rhinitis.     fluticasone  furoate-vilanterol (BREO ELLIPTA ) 200-25 MCG/ACT AEPB Inhale 1 puff into the lungs daily. 180 each 3   ipratropium (ATROVENT ) 0.03 % nasal spray Place 2 sprays into both nostrils every 12 (twelve) hours. 30 mL 12   losartan  (COZAAR ) 100 MG tablet Take 100 mg by mouth daily.     MACROBID 100 MG capsule 1 capsule with food Orally every 12 hrs; Duration: 5 days     methocarbamol (ROBAXIN) 500 MG tablet Take 500 mg by mouth every 8 (eight) hours as needed for muscle spasms.     ondansetron  (ZOFRAN ) 4 MG tablet  TAKE 1 TABLET (4 MG TOTAL) BY MOUTH AS NEEDED FOR NAUSEA OR VOMITING. 18 tablet 4   pantoprazole  (PROTONIX ) 40 MG tablet Take 40 mg by mouth daily.     Probiotic Product (ALIGN) 10 MG CAPS Take 1 capsule by mouth daily.     promethazine  (PHENERGAN ) 25 MG tablet TAKE 1 TABLET BY MOUTH EVERY 6 HOURS AS NEEDED 30 tablet 3   sodium chloride  (OCEAN) 0.65 % SOLN nasal spray Place 1 spray into both nostrils as needed for congestion.     tamoxifen  (NOLVADEX ) 10 MG tablet Take 1 tablet (10 mg total) by mouth daily. 90 tablet 3   Tiotropium Bromide (SPIRIVA  RESPIMAT) 1.25 MCG/ACT AERS Inhale 2 puffs into the lungs daily. 16 g 3   traMADol  (ULTRAM ) 50 MG tablet  Take 1 tablet (50 mg total) by mouth every 6 (six) hours as needed for moderate pain or severe pain. 10 tablet 0   traZODone  (DESYREL ) 50 MG tablet Take 50 mg by mouth at bedtime.     cetirizine (ZYRTEC) 10 MG tablet Take 10 mg by mouth daily. (Patient not taking: Reported on 11/28/2024)     No facility-administered medications prior to visit.    PAST MEDICAL HISTORY: Past Medical History:  Diagnosis Date   Allergic rhinitis    Allergy    seasonal   Anemia    Anxiety    Aortic atherosclerosis 11/01/2023   Arthritis    low back   Breast cancer (HCC) 2024   RIGHT   Cataract    removed bilateraly   Chronic constipation    COVID-19 08/2019   Degenerative disc disease, cervical    Essential hypertension    GERD (gastroesophageal reflux disease) 1957   H/O bronchitis    Headache    otc med prn tylenol    History of cerebral artery stenosis 02/2024   History of echocardiogram    in epic 04-21-2019,  normal w/ ef 60-65% and mild mitral regurg, no stenosis or restriction   History of TIAs    per pt age 87;  2008;  05/ 2013;  pt stated no residual was from sleep doctor more than likely sleep deprived than actual tia   Motion sickness    cars   Nocturia    OSA on CPAP    Severe ,, CPAP 12 MM H2O- Dr Shlomo   Palpitations 01/09/2022   hx-no current problems per pt on 05/29/24   PONV (postoperative nausea and vomiting)    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   Restless leg syndrome    Rosacea    Seasonal asthma    albuterol  inhaler prn /elliupta inhaler   Sleep apnea    uses CPAP nightly   Stroke [facility]) 1985   TIAx2    PAST SURGICAL HISTORY: Past Surgical History:  Procedure Laterality Date   ANTERIOR CERVICAL DECOMP/DISCECTOMY FUSION N/A 10/25/2017   Procedure: CERVICAL SIX- CERVICAL SEVEN ANTERIOR CERVICAL DECOMPRESSION/DISCECTOMY FUSION, INTERBODY PROSTHESIS AND ANTERIOR PLATING;  Surgeon: Mavis Purchase, MD;  Location: [facility] OR;  Service: Neurosurgery;  Laterality: N/A;  CERVICAL  6- CERVICAL 7 ANTERIOR CERVICAL DECOMPRESSION/DISCECTOMY FUSION, INTERBODY PROSTHESIS AND ANTERIOR PLATING   BACK SURGERY  04/2022   BILATERAL BENIGN BREAST BX'S  20 YRS AGO   BREAST BIOPSY Right    benign   BREAST BIOPSY Right 01/27/2023   MM RT BREAST BX W LOC DEV 1ST LESION IMAGE BX SPEC STEREO GUIDE 01/27/2023 [facility]   BREAST BIOPSY Right 02/05/2023   MM RT BREAST BX W LOC  DEV 1ST LESION IMAGE BX SPEC STEREO GUIDE 02/05/2023 [facility]   BREAST BIOPSY  03/09/2023   MM RT RADIOACTIVE SEED LOC MAMMO GUIDE 03/09/2023 [facility]   BREAST BIOPSY  03/09/2023   MM RT RADIOACTIVE SEED EA ADD LESION LOC MAMMO GUIDE 03/09/2023 [facility]   BREAST EXCISIONAL BIOPSY Bilateral over 10 years ago   benign   BREAST LUMPECTOMY WITH RADIOACTIVE SEED LOCALIZATION Right 03/10/2023   Procedure: RIGHT BREAST LUMPECTOMY WITH RADIOACTIVE SEED LOCALIZATION;  Surgeon: Ebbie Cough, MD;  Location: [facility] OR;  Service: General;  Laterality: Right;   CARDIAC CATHETERIZATION N/A 08/13/2016   Procedure: Right Heart Cath;  Surgeon: Ezra GORMAN Shuck, MD;  Location: [facility] INVASIVE CV LAB;  Service: Cardiovascular;  Laterality: N/A;    normal study, no evidence pulm HTN   CARPAL TUNNEL RELEASE  2009   BILATERAL   CATARACT EXTRACTION W/PHACO Right 02/27/2020   Procedure: CATARACT EXTRACTION PHACO AND INTRAOCULAR LENS PLACEMENT (IOC) RIGHT 3.60 00:23.9;  Surgeon: Jaye Fallow, MD;  Location: [facility];  Service: Ophthalmology;  Laterality: Right;  sleep apnea   CATARACT EXTRACTION W/PHACO Left 05/21/2020   Procedure: CATARACT EXTRACTION PHACO AND INTRAOCULAR LENS PLACEMENT (IOC) LEFT 3.69 00:30.9;  Surgeon: Jaye Fallow, MD;  Location: [facility];  Service: Ophthalmology;  Laterality: Left;  sleep apnea   CHOLECYSTECTOMY N/A 03/25/2021   Procedure: LAPAROSCOPIC CHOLECYSTECTOMY;  Surgeon: Ebbie Cough, MD;  Location: [facility] OR;  Service: General;  Laterality: N/A;    CYSTOCELE REPAIR  12/11/2011   Procedure: ANTERIOR REPAIR (CYSTOCELE);  Surgeon: Mark C Ottelin, MD;  Location: [facility];  Service: Urology;  Laterality: N/A;  1 1/2 hour requested for case  Anterior repair and mid Urethral Sling   INCISIONAL HERNIA REPAIR N/A 12/24/2021   Procedure: LAP ASSISTED INCISIONAL HERNIA REPAIR WITH MESH;  Surgeon: Ebbie Cough, MD;  Location: [facility] OR;  Service: General;  Laterality: N/A;  GEN & TAP BLOCK RNFA   IR 3D INDEPENDENT WKST  05/04/2024   IR ANGIO INTRA EXTRACRAN SEL COM CAROTID INNOMINATE BILAT MOD SED  05/04/2024   IR ANGIO INTRA EXTRACRAN SEL INTERNAL CAROTID UNI L MOD SED  05/31/2024   IR ANGIO VERTEBRAL SEL VERTEBRAL BILAT MOD SED  05/04/2024   IR CT HEAD LTD  05/31/2024   IR RADIOLOGIST EVAL & MGMT  03/30/2024   IR RADIOLOGIST EVAL & MGMT  06/26/2024   IR TRANSCATH/EMBOLIZ  05/31/2024   MASS EXCISION N/A 07/24/2019   Procedure: EXCISION OF VAGINAL FOREIGN BODY;  Surgeon: Ottelin, Mark, MD;  Location: [facility];  Service: Urology;  Laterality: N/A;   NASAL SEPTOPLASTY W/ TURBINOPLASTY Bilateral 11/18/2016   Procedure: NASAL SEPTOPLASTY WITH BILATERAL TURBINATE REDUCTION;  Surgeon: Alm Bouche, MD;  Location: [facility] OR;  Service: ENT;  Laterality: Bilateral;   NONINVASIVE VASCULAR CAROTID STUDY  09/14/2007   BILATERAL MILD MIX PLAQUE THROUGHOUT, NO SIGNIFICANT BILATERAL ICA STENOSIS   PUBOVAGINAL SLING  12/11/2011   Procedure: CARLOYN GLADE;  Surgeon: Oneil JAYSON Rafter, MD;  Location: [facility];  Service: Urology;  Laterality: N/A;   PULLEY RELEASE RIGHT THUMB  2009   RADIOACTIVE SEED GUIDED EXCISIONAL BREAST BIOPSY Right 03/10/2023   Procedure: RADIOACTIVE SEED GUIDED EXCISIONAL RIGHT BREAST BIOPSY;  Surgeon: Ebbie Cough, MD;  Location: [facility] OR;  Service: General;  Laterality: Right;   RADIOLOGY WITH ANESTHESIA N/A 05/31/2024   Procedure: RADIOLOGY WITH ANESTHESIA;  Surgeon: Dolphus Carrion,  MD;  Location: [facility] OR;  Service: Radiology;  Laterality:  N/A;   RE-EXCISION OF BREAST LUMPECTOMY Right 03/30/2023   Procedure: RE-EXCISION OF RIGHT BREAST LUMPECTOMY;  Surgeon: Ebbie Cough, MD;  Location: [facility];  Service: General;  Laterality: Right;   SHOULDER ARTHROSCOPY DISTAL CLAVICLE EXCISION AND OPEN ROTATOR CUFF REPAIR Right 01-17-2019   dr supple @[facility]    SINUS ENDO WITH FUSION Bilateral 11/18/2016   Procedure: BILATERAL ENDOSCOPIC SINUS SURGERY;  Surgeon: Alm Bouche, MD;  Location: [facility] OR;  Service: ENT;  Laterality: Bilateral;   UMBILICAL HERNIA REPAIR N/A 03/25/2021   Procedure: Primary REPAIR UMBILICAL HERNIA;  Surgeon: Ebbie Cough, MD;  Location: [facility] OR;  Service: General;  Laterality: N/A;   UPPER GASTROINTESTINAL ENDOSCOPY     VAGINAL HYSTERECTOMY  AGE 25    FAMILY HISTORY: Family History  Adopted: Yes  Problem Relation Age of Onset   Diabetes Mother    Stroke Mother    Alzheimer's disease Mother    Hypertension Sister    Diabetes Sister    Heart attack Brother    Leukemia Brother    CAD Brother    Diabetes Brother    Colon cancer Maternal Aunt    Stroke Paternal Grandmother    Heart attack Paternal Grandfather    Esophageal cancer Neg Hx    Rectal cancer Neg Hx    Stomach cancer Neg Hx    Colon polyps Neg Hx     SOCIAL HISTORY: Social History   Socioeconomic History   Marital status: Married    Spouse name: Not on file   Number of children: 2   Years of education: Not on file   Highest education level: Not on file  Occupational History   Not on file  Tobacco Use   Smoking status: Never   Smokeless tobacco: Never  Vaping Use   Vaping status: Never Used  Substance and Sexual Activity   Alcohol use: No   Drug use: No   Sexual activity: Not Currently    Birth control/protection: None    Comment: Hysterectomy  Other Topics Concern   Not on file  Social History Narrative   Lives with husband.    Retired  Environmental Health Practitioner   Has 2 children.   Social Drivers of Health   Tobacco Use: Low Risk (11/28/2024)   Patient History    Smoking Tobacco Use: Never    Smokeless Tobacco Use: Never    Passive Exposure: Not on file  Financial Resource Strain: Not on file  Food Insecurity: No Food Insecurity (05/31/2024)   Epic    Worried About Programme Researcher, Broadcasting/film/video in the Last Year: Never true    Ran Out of Food in the Last Year: Never true  Transportation Needs: No Transportation Needs (05/31/2024)   Epic    Lack of Transportation (Medical): No    Lack of Transportation (Non-Medical): No  Physical Activity: Not on file  Stress: Not on file  Social Connections: Patient Declined (05/31/2024)   Social Connection and Isolation Panel    Frequency of Communication with Friends and Family: Patient declined    Frequency of Social Gatherings with Friends and Family: Patient declined    Attends Religious Services: Patient declined    Database Administrator or Organizations: Patient declined    Attends Banker Meetings: Patient declined    Marital Status: Patient declined  Intimate Partner Violence: Not At Risk (05/31/2024)   Epic    Fear of Current or Ex-Partner: No    Emotionally Abused: No    Physically  Abused: No    Sexually Abused: No  Depression (PHQ2-9): Low Risk (03/16/2023)   Depression (PHQ2-9)    PHQ-2 Score: 0  Alcohol Screen: Not on file  Housing: Low Risk (05/31/2024)   Epic    Unable to Pay for Housing in the Last Year: No    Number of Times Moved in the Last Year: 0    Homeless in the Last Year: No  Utilities: Not At Risk (05/31/2024)   Epic    Threatened with loss of utilities: No  Health Literacy: Not on file    PHYSICAL EXAM  GENERAL EXAM/CONSTITUTIONAL: Vitals:  Vitals:   11/28/24 1108  BP: (!) 160/78  Pulse: 83  SpO2: 97%  Weight: 170 lb 8 oz (77.3 kg)  Height: 5' 4 (1.626 m)   Body mass index is 29.27 kg/m. Wt Readings from Last 3 Encounters:  11/28/24  170 lb 8 oz (77.3 kg)  11/09/24 172 lb (78 kg)  11/09/24 172 lb (78 kg)   Patient is in no distress; well developed, nourished and groomed; neck is supple  MUSCULOSKELETAL: Gait, strength, tone, movements noted in Neurologic exam below  NEUROLOGIC: MENTAL STATUS:      No data to display         awake, alert, oriented to person, place and time recent and remote memory intact normal attention and concentration language fluent, comprehension intact, naming intact fund of knowledge appropriate  CRANIAL NERVE:  2nd, 3rd, 4th, 6th - Visual fields full to confrontation, extraocular muscles intact, no nystagmus 5th - facial sensation symmetric 7th - facial strength symmetric 8th - hearing intact 9th - palate elevates symmetrically, uvula midline 11th - shoulder shrug symmetric 12th - tongue protrusion midline  MOTOR:  normal bulk and tone, full strength in the BUE, BLE  SENSORY:  normal and symmetric to light touch  COORDINATION:  finger-nose-finger, fine finger movements normal   GAIT/STATION:  normal    DIAGNOSTIC DATA (LABS, IMAGING, TESTING) - I reviewed patient records, labs, notes, testing and imaging myself where available.  Lab Results  Component Value Date   WBC 5.0 09/06/2024   HGB 12.3 09/06/2024   HCT 36.7 09/06/2024   MCV 90.8 09/06/2024   PLT 187 09/06/2024      Component Value Date/Time   NA 136 06/01/2024 0635   K 3.5 06/01/2024 0635   CL 107 06/01/2024 0635   CO2 21 (L) 06/01/2024 0635   GLUCOSE 110 (H) 06/01/2024 0635   BUN 5 (L) 06/01/2024 0635   CREATININE 0.90 06/01/2024 0635   CREATININE 0.66 04/22/2016 1436   CALCIUM  8.2 (L) 06/01/2024 0635   PROT 6.8 08/20/2023 1000   ALBUMIN 4.1 08/20/2023 1000   AST 18 08/20/2023 1000   ALT 15 08/20/2023 1000   ALKPHOS 49 08/20/2023 1000   BILITOT 0.7 08/20/2023 1000   GFRNONAA >60 06/01/2024 0635   GFRAA >60 10/18/2017 1426   Lab Results  Component Value Date   CHOL 142 04/12/2012    HDL 29 (L) 04/12/2012   LDLCALC 75 04/12/2012   TRIG 192 (H) 04/12/2012   CHOLHDL 4.9 04/12/2012   Lab Results  Component Value Date   HGBA1C 6.0 (H) 04/12/2012   Lab Results  Component Value Date   VITAMINB12 206 (L) 07/06/2022   Lab Results  Component Value Date   TSH 1.480 07/06/2022     ASSESSMENT AND PLAN  78 y.o. year old female with history of hypertension, anxiety, obstructive sleep apnea who is presenting with complaint of  left sided headache.  Based on description these are likely migraines.  Since they are episodic, we will treat her with Nurtec.  I will give her a sample to see if it is effective and if effective we will send a prescription.  For her pulsatile tinnitus and carotid web s/p stent, she will continue to follow-up with Dr. Lester neurosurgery and will call for additional updates.   1. Migraine without aura and without status migrainosus, not intractable   2. Pulsatile tinnitus of left ear   3. Benign essential HTN   4. OSA (obstructive sleep apnea)      Patient Instructions  Continue daily aspirin  Continue other medication Trial of Nurtec, patient will call us  to inform us  if medication is effective Return sooner if symptoms are worse   No orders of the defined types were placed in this encounter.   No orders of the defined types were placed in this encounter.   Return if symptoms worsen or fail to improve.    Pastor Falling, MD 11/28/2024, 4:28 PM  [facility] [address] [place], [place] [address] [phone]     [1]  Allergies Allergen Reactions   Bactrim [Sulfamethoxazole-Trimethoprim] Swelling   "

## 2024-11-28 NOTE — Patient Instructions (Addendum)
 Continue daily aspirin  Continue other medication Trial of Nurtec, patient will call us  to inform us  if medication is effective Return sooner if symptoms are worse

## 2024-11-29 ENCOUNTER — Ambulatory Visit: Admitting: Podiatry

## 2024-11-29 VITALS — Ht 64.0 in | Wt 170.5 lb

## 2024-11-29 DIAGNOSIS — D2372 Other benign neoplasm of skin of left lower limb, including hip: Secondary | ICD-10-CM | POA: Diagnosis not present

## 2024-11-29 NOTE — Progress Notes (Signed)
"  °  Subjective:  Patient ID: Amy Vang, female    DOB: [birth year],  MRN: [id number]  Chief Complaint  Patient presents with   Foot Problem    RM 3 Patient is here for a callus of the bottom of the left foot ( lateral edge).    78 y.o. female presents with the above complaint. History confirmed with patient.  Lesion continues to grow back has been slower this time but is once again painful Objective:  Physical Exam: warm, good capillary refill, no trophic changes or ulcerative lesions, normal DP and PT pulses and normal sensory exam. Left Foot: Benign-appearing hard painful skin submetatarsal 5 Assessment:   1. Benign neoplasm of skin of left foot      Plan:  Patient was evaluated and treated and all questions answered.  Patient was evaluated has not significantly increased in size but is deep and painful.  I debrided the lesion sharply with a scalpel to remove the overlying hyperkeratosis and a nucleated central core.  This was done to expose the lesion.  Salinocaine salicylic acid treatment was applied to destroy the lesion.  Bandage applied.  Post care instructions given.  She will utilize these at home OTC.  Follow-up as needed.  No follow-ups on file.  "

## 2024-12-06 ENCOUNTER — Ambulatory Visit: Admitting: Internal Medicine

## 2024-12-06 ENCOUNTER — Encounter: Payer: Self-pay | Admitting: Internal Medicine

## 2024-12-06 VITALS — BP 156/72 | HR 89 | Ht 64.0 in | Wt 170.0 lb

## 2024-12-06 DIAGNOSIS — K582 Mixed irritable bowel syndrome: Secondary | ICD-10-CM

## 2024-12-06 DIAGNOSIS — Z860101 Personal history of adenomatous and serrated colon polyps: Secondary | ICD-10-CM

## 2024-12-06 DIAGNOSIS — M6289 Other specified disorders of muscle: Secondary | ICD-10-CM

## 2024-12-06 DIAGNOSIS — M9905 Segmental and somatic dysfunction of pelvic region: Secondary | ICD-10-CM

## 2024-12-06 DIAGNOSIS — R159 Full incontinence of feces: Secondary | ICD-10-CM | POA: Diagnosis not present

## 2024-12-06 NOTE — Progress Notes (Signed)
 [address]       Amy Vang 79 y.o. 1946/04/04 [id number]  Assessment & Plan:   Encounter Diagnoses  Name Primary?   Irritable bowel syndrome with both constipation and diarrhea Yes   Hx of adenomatous colonic polyps    Full incontinence of feces    Pelvic floor dysfunction       Chronic symptoms of alternating constipation and diarrhea, fecal incontinence, and abdominal discomfort impair quality of life. - Dietary modification: four prunes daily and/or two kiwi fruits daily. - Polyethylene glycol (Miralax ) if no bowel movement by second day; consider half capful to minimize excessive bowel movements. - Dicyclomine  as needed for abdominal discomfort; extra dose up to maximum allowed for severe symptoms. - Confirmed dicyclomine  regimen (10 mg three times daily) is safe. - Reviewed pantoprazole  and famotidine  use.  History of adenomatous colon polyps History of multiple small adenomatous polyps with ongoing risk for colorectal neoplasia. - Reviewed prior colonoscopy reports confirming adenomatous polyps. - Discussed surveillance colonoscopy based on her values and preferences. - If proceeding, advised use of previously obtained Suprep bowel preparation, even if expired. - Provided copy of prior colonoscopy report. The risks and benefits as well as alternatives of endoscopic procedure(s) have been discussed and reviewed. All questions answered. The patient agrees to proceed.   Pelvic floor dysfunction suspected on the basis of prior urinary incontinence and surgical intervention for prolapeed bladder , with ongoing fecal incontinence. - Referral to pelvic floor physical therapy at [facility]. - Clarified current physical therapy for lower back issues at [facility] is distinct from pelvic floor therapy.     Subjective:   Chief Complaint:IBS  HPI Discussed the use of AI scribe software for clinical note transcription with the patient, who gave verbal consent to proceed.  Amy Vang is a 79 year old female with irritable bowel syndrome (mixed type), prior adenomatous colon polyps, and pelvic floor dysfunction who presents for evaluation of ongoing bowel irregularity.  Bowel habit irregularity and stool consistency - Periods without a bowel movement can last up to five days, followed by days of frequent, urgent, loose stools.. - Stools are very soft with associated leakage,  - Severe symptoms lead to avoidance of leaving the house. - Stool softeners and laxatives, including Miralax , have resulted in unpredictable responses, sometimes causing multiple bowel movements in a day. - Discontinuing Stevia has not significantly improved symptoms. - Apprehensive about using medications to regulate bowels due to unpredictable effects. - period of severe diarrhea last summer - resoplved after stopping Brillinta  Abdominal symptoms - Prominent abdominal growling and rumbling noises, especially when bowel movements are infrequent. - Abdominal noises are sometimes accompanied by nausea. - Uses Phenergan  and Zofran  as needed for nausea. - Lower abdominal discomfort resolves after a bowel movement. - Social impact of abdominal noises is a concern, particularly in public. - Dicyclomine  10 mg after breakfast, at supper, and at bedtime has not been effective for abdominal rumbling. - Takes Pepcid  at bedtime and pantoprazole  twice daily.  Medication-related diarrhea - Experienced severe diarrhea during summer 2024 while taking Brilinta . - Symptoms improved after discontinuing Brilinta .  Pelvic floor dysfunction and urinary incontinence - Prior sling procedure for urinary incontinence resolved urinary leakage. - Acknowledges prior pelvic floor weakness. - Currently undergoing evaluation and treatment for lower back issues, including upcoming physical therapy and possible MRI.  Colorectal neoplasia risk and surveillance - History of colon polyps found at every colonoscopy, with  at least one precancerous adenoma. - Concerned about  colon cancer risk, especially given a maternal aunt who died of colon cancer. - Previously canceled a scheduled colonoscopy and still has the preparation kit.  Cancer risk concerns - History of breast cancer. - Expresses desire to be cautious regarding cancer risk.     Low back pain - going to start PT and likely to have an epidural after if not better  Allergies[1] Active Medications[2] Past Medical History:  Diagnosis Date   Allergic rhinitis    Allergy    seasonal   Anemia    Anxiety    Aortic atherosclerosis 11/01/2023   Arthritis    low back   Breast cancer (HCC) 2024   RIGHT   Cataract    removed bilateraly   Chronic constipation    COVID-19 08/2019   Degenerative disc disease, cervical    Essential hypertension    GERD (gastroesophageal reflux disease) 1957   H/O bronchitis    Headache    otc med prn tylenol    History of cerebral artery stenosis 02/2024   History of echocardiogram    in epic 04-21-2019,  normal w/ ef 60-65% and mild mitral regurg, no stenosis or restriction   History of TIAs    per pt age 63;  2008;  05/ 2013;  pt stated no residual was from sleep doctor more than likely sleep deprived than actual tia   Motion sickness    cars   Nocturia    OSA on CPAP    Severe ,, CPAP 12 MM H2O- Dr Shlomo   Palpitations 01/09/2022   hx-no current problems per pt on 05/29/24   PONV (postoperative nausea and vomiting)    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   Restless leg syndrome    Rosacea    Seasonal asthma    albuterol  inhaler prn /elliupta inhaler   Sleep apnea    uses CPAP nightly   Stroke (HCC) 1985   TIAx2   Past Surgical History:  Procedure Laterality Date   ANTERIOR CERVICAL DECOMP/DISCECTOMY FUSION N/A 10/25/2017   Procedure: CERVICAL SIX- CERVICAL SEVEN ANTERIOR CERVICAL DECOMPRESSION/DISCECTOMY FUSION, INTERBODY PROSTHESIS AND ANTERIOR PLATING;  Surgeon: Mavis Purchase, MD;  Location: [facility] OR;   Service: Neurosurgery;  Laterality: N/A;  CERVICAL 6- CERVICAL 7 ANTERIOR CERVICAL DECOMPRESSION/DISCECTOMY FUSION, INTERBODY PROSTHESIS AND ANTERIOR PLATING   BACK SURGERY  04/2022   BILATERAL BENIGN BREAST BX'S  20 YRS AGO   BREAST BIOPSY Right    benign   BREAST BIOPSY Right 01/27/2023   MM RT BREAST BX W LOC DEV 1ST LESION IMAGE BX SPEC STEREO GUIDE 01/27/2023 [facility]   BREAST BIOPSY Right 02/05/2023   MM RT BREAST BX W LOC DEV 1ST LESION IMAGE BX SPEC STEREO GUIDE 02/05/2023 [facility]   BREAST BIOPSY  03/09/2023   MM RT RADIOACTIVE SEED LOC MAMMO GUIDE 03/09/2023 [facility]   BREAST BIOPSY  03/09/2023   MM RT RADIOACTIVE SEED EA ADD LESION LOC MAMMO GUIDE 03/09/2023 [facility]   BREAST EXCISIONAL BIOPSY Bilateral over 10 years ago   benign   BREAST LUMPECTOMY WITH RADIOACTIVE SEED LOCALIZATION Right 03/10/2023   Procedure: RIGHT BREAST LUMPECTOMY WITH RADIOACTIVE SEED LOCALIZATION;  Surgeon: Ebbie Cough, MD;  Location: [facility] OR;  Service: General;  Laterality: Right;   CARDIAC CATHETERIZATION N/A 08/13/2016   Procedure: Right Heart Cath;  Surgeon: Ezra GORMAN Shuck, MD;  Location: [facility] INVAS[facility] CV LAB;  Service: Cardiovascular;  Laterality: N/A;    normal study, no evidence pulm HTN   CARPAL TUNNEL RELEASE  2009  BILATERAL   CATARACT EXTRACTION W/PHACO Right 02/27/2020   Procedure: CATARACT EXTRACTION PHACO AND INTRAOCULAR LENS PLACEMENT (IOC) RIGHT 3.60 00:23.9;  Surgeon: Jaye Fallow, MD;  Location: [facility];  Service: Ophthalmology;  Laterality: Right;  sleep apnea   CATARACT EXTRACTION W/PHACO Left 05/21/2020   Procedure: CATARACT EXTRACTION PHACO AND INTRAOCULAR LENS PLACEMENT (IOC) LEFT 3.69 00:30.9;  Surgeon: Jaye Fallow, MD;  Location: [facility];  Service: Ophthalmology;  Laterality: Left;  sleep apnea   CHOLECYSTECTOMY N/A 03/25/2021   Procedure: LAPAROSCOPIC CHOLECYSTECTOMY;  Surgeon: Ebbie Cough, MD;   Location[facility] OR;  Service: General;  Laterality: N/A;   CYSTOCELE REPAIR  12/11/2011   Procedure: ANTERIOR REPAIR (CYSTOCELE);  Surgeon: Mark C Ottelin, MD;  Location: [facility];  Service: Urology;  Laterality: N/A;  1 1/2 hour requested for case  Anterior repair and mid Urethral Sling   INCISIONAL HERNIA REPAIR N/A 12/24/2021   Procedure: LAP ASSISTED INCISIONAL HERNIA REPAIR WITH MESH;  Surgeon: Ebbie Cough, MD;  Location: [facility] OR;  Service: General;  Laterality: N/A;  GEN & TAP BLOCK RNFA   IR 3D INDEPENDENT WKST  05/04/2024   IR ANGIO INTRA EXTRACRAN SEL COM CAROTID INNOMINATE BILAT MOD SED  05/04/2024   IR ANGIO INTRA EXTRACRAN SEL INTERNAL CAROTID UNI L MOD SED  05/31/2024   IR ANGIO VERTEBRAL SEL VERTEBRAL BILAT MOD SED  05/04/2024   IR CT HEAD LTD  05/31/2024   IR RADIOLOGIST EVAL & MGMT  03/30/2024   IR RADIOLOGIST EVAL & MGMT  06/26/2024   IR TRANSCATH/EMBOLIZ  05/31/2024   MASS EXCISION N/A 07/24/2019   Procedure: EXCISION OF VAGINAL FOREIGN BODY;  Surgeon: Ottelin, Mark, MD;  Location: [facility];  Service: Urology;  Laterality: N/A;   NASAL SEPTOPLASTY W/ TURBINOPLASTY Bilateral 11/18/2016   Procedure: NASAL SEPTOPLASTY WITH BILATERAL TURBINATE REDUCTION;  Surgeon: Alm Bouche, MD;  Location: [facility] OR;  Service: ENT;  Laterality: Bilateral;   NONINVASIVE VASCULAR CAROTID STUDY  09/14/2007   BILATERAL MILD MIX PLAQUE THROUGHOUT, NO SIGNIFICANT BILATERAL ICA STENOSIS   PUBOVAGINAL SLING  12/11/2011   Procedure: CARLOYN GLADE;  Surgeon: Oneil JAYSON Rafter, MD;  Location: [facility];  Service: Urology;  Laterality: N/A;   PULLEY RELEASE RIGHT THUMB  2009   RADIOACTIVE SEED GUIDED EXCISIONAL BREAST BIOPSY Right 03/10/2023   Procedure: RADIOACTIVE SEED GUIDED EXCISIONAL RIGHT BREAST BIOPSY;  Surgeon: Ebbie Cough, MD;  Location: [facility] OR;  Service: General;  Laterality: Right;   RADIOLOGY WITH ANESTHESIA N/A 05/31/2024   Procedure:  RADIOLOGY WITH ANESTHESIA;  Surgeon: Dolphus Carrion, MD;  Location: [facility] OR;  Service: Radiology;  Laterality: N/A;   RE-EXCISION OF BREAST LUMPECTOMY Right 03/30/2023   Procedure: RE-EXCISION OF RIGHT BREAST LUMPECTOMY;  Surgeon: Ebbie Cough, MD;  Location: [facility];  Service: General;  Laterality: Right;   SHOULDER ARTHROSCOPY DISTAL CLAVICLE EXCISION AND OPEN ROTATOR CUFF REPAIR Right 01-17-2019   dr supple @[facility]    SINUS ENDO WITH FUSION Bilateral 11/18/2016   Procedure: BILATERAL ENDOSCOPIC SINUS SURGERY;  Surgeon: Alm Bouche, MD;  Location: [facility] OR;  Service: ENT;  Laterality: Bilateral;   UMBILICAL HERNIA REPAIR N/A 03/25/2021   Procedure: Primary REPAIR UMBILICAL HERNIA;  Surgeon: Ebbie Cough, MD;  Location: [facility] OR;  Service: General;  Laterality: N/A;   UPPER GASTROINTESTINAL ENDOSCOPY     VAGINAL HYSTERECTOMY  AGE 52   Social History   Social History Narrative   Lives with husband.    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   family history includes Alzheimer's disease in her mother; CAD in her brother; Colon cancer in her maternal aunt; Diabetes in her brother, mother, and sister; Heart attack in her brother and paternal grandfather; Hypertension in her sister; Leukemia in her brother; Stroke in her mother and paternal grandmother. She was adopted.   Review of Systems As per HPI  Objective:   Physical Exam @BP  (!) 156/72   Pulse 89   Ht 5' 4 (1.626 m)   Wt 170 lb (77.1 kg)   BMI 29.18 kg/m @  General:  NAD Eyes:   anicteric Lungs:  clear Heart::  S1S2 no rubs, murmurs or gallops Abdomen:  soft and nontender, BS+ Ext:   no edema, cyanosis or clubbing    Data Reviewed:  See HPI    [1]  Allergies Allergen Reactions   Bactrim [Sulfamethoxazole-Trimethoprim] Swelling  [2]  Current Meds  Medication Sig   acetaminophen  (TYLENOL ) 650 MG CR tablet Take 650 mg by mouth every 8 (eight) hours as needed for pain.    albuterol  (VENTOLIN  HFA) 108 (90 Base) MCG/ACT inhaler TAKE 2 PUFFS BY MOUTH EVERY 6 HOURS AS NEEDED FOR WHEEZE OR SHORTNESS OF BREATH   citalopram  (CELEXA ) 10 MG tablet Take 10 mg by mouth daily.   clonazePAM (KLONOPIN) 0.5 MG tablet Take 0.5 mg by mouth at bedtime.   cyclobenzaprine  (FLEXERIL ) 10 MG tablet Take 1 tablet (10 mg total) by mouth 3 (three) times daily as needed for muscle spasms.   dicyclomine  (BENTYL ) 10 MG capsule TAKE 1 CAPSULE (10 MG TOTAL) BY MOUTH 4 (FOUR) TIMES DAILY AS NEEDED FOR SPASMS.   famotidine  (PEPCID ) 40 MG tablet Take 40 mg by mouth at bedtime.   fexofenadine (ALLEGRA) 180 MG tablet Take 180 mg by mouth daily.   fluticasone  (FLONASE ) 50 MCG/ACT nasal spray Place 1 spray into both nostrils daily as needed for allergies or rhinitis.   fluticasone  furoate-vilanterol (BREO ELLIPTA ) 200-25 MCG/ACT AEPB Inhale 1 puff into the lungs daily.   ipratropium (ATROVENT ) 0.03 % nasal spray Place 2 sprays into both nostrils every 12 (twelve) hours.   losartan  (COZAAR ) 100 MG tablet Take 100 mg by mouth daily.   methocarbamol (ROBAXIN) 500 MG tablet Take 500 mg by mouth every 8 (eight) hours as needed for muscle spasms.   ondansetron  (ZOFRAN ) 4 MG tablet TAKE 1 TABLET (4 MG TOTAL) BY MOUTH AS NEEDED FOR NAUSEA OR VOMITING.   pantoprazole  (PROTONIX ) 40 MG tablet Take 40 mg by mouth daily.   Probiotic Product (ALIGN) 10 MG CAPS Take 1 capsule by mouth daily.   promethazine  (PHENERGAN ) 25 MG tablet TAKE 1 TABLET BY MOUTH EVERY 6 HOURS AS NEEDED   sodium chloride  (OCEAN) 0.65 % SOLN nasal spray Place 1 spray into both nostrils as needed for congestion.   tamoxifen  (NOLVADEX ) 10 MG tablet Take 1 tablet (10 mg total) by mouth daily. (Patient taking differently: Take 5 mg by mouth daily.)   Tiotropium Bromide (SPIRIVA  RESPIMAT) 1.25 MCG/ACT AERS Inhale 2 puffs into the lungs daily.   traMADol  (ULTRAM ) 50 MG tablet Take 1 tablet (50 mg total) by mouth every 6 (six) hours as needed for  moderate pain or severe pain.   traZODone  (DESYREL ) 50 MG tablet Take 50 mg by mouth at bedtime.   "

## 2024-12-06 NOTE — Patient Instructions (Addendum)
" °  VISIT SUMMARY: Today, we discussed your ongoing bowel irregularity, including periods of constipation and diarrhea, and the impact on your daily life. We also reviewed your history of colon polyps and the need for continued surveillance, as well as your pelvic floor dysfunction and its management.  YOUR PLAN: IRRITABLE BOWEL SYNDROME WITH MIXED BOWEL HABITS: You have chronic symptoms of alternating constipation and diarrhea, fecal incontinence, and abdominal discomfort that affect your quality of life. -Incorporate four prunes daily and/or two kiwi fruits daily into your diet. -Use Polyethylene glycol (Miralax ) if you haven't had a bowel movement by the second day. Or another laxativeConsider using half a capful to avoid excessive bowel movements. -Take Dicyclomine  as needed for abdominal discomfort. You can take an extra dose up to the maximum allowed for severe symptoms. -Continue taking pantoprazole  and famotidine  as previously prescribed.  HISTORY OF ADENOMATOUS COLON POLYPS: You have a history of multiple small adenomatous polyps, which requires ongoing surveillance to monitor for colorectal neoplasia. -We reviewed your prior colonoscopy reports confirming adenomatous polyps. -We discussed the importance of surveillance colonoscopy based on your values and preferences. If you decide to proceed, you can use the previously obtained Suprep bowel preparation, even if it is expired. -You were provided with a copy of your prior colonoscopy report.  PELVIC FLOOR DYSFUNCTION: You likely have chronic pelvic floor muscle weakness likely which is contributing to ongoing bowel issues and fecal incontinence. -You are referred to pelvic floor physical therapy at [facility].    I appreciate the opportunity to care for you. Lupita Commander, MD, [facility]                   Contains text generated by Abridge.                                 Contains text generated by  Abridge.   "

## 2024-12-11 ENCOUNTER — Encounter: Payer: Self-pay | Admitting: Neurology

## 2024-12-12 MED ORDER — NURTEC 75 MG PO TBDP: 75.0000 mg | ORAL_TABLET | Freq: Every day | ORAL | 6 refills | Status: AC | PRN

## 2024-12-13 ENCOUNTER — Other Ambulatory Visit: Payer: Self-pay | Admitting: Oncology

## 2024-12-13 ENCOUNTER — Encounter: Payer: Self-pay | Admitting: Internal Medicine

## 2024-12-13 DIAGNOSIS — Z853 Personal history of malignant neoplasm of breast: Secondary | ICD-10-CM

## 2024-12-14 ENCOUNTER — Encounter: Payer: Self-pay | Admitting: Pulmonary Disease

## 2024-12-14 ENCOUNTER — Other Ambulatory Visit: Payer: Self-pay | Admitting: Internal Medicine

## 2024-12-14 DIAGNOSIS — K219 Gastro-esophageal reflux disease without esophagitis: Secondary | ICD-10-CM

## 2024-12-14 DIAGNOSIS — J4540 Moderate persistent asthma, uncomplicated: Secondary | ICD-10-CM

## 2024-12-14 DIAGNOSIS — R053 Chronic cough: Secondary | ICD-10-CM

## 2024-12-14 MED ORDER — METHYLPREDNISOLONE 4 MG PO TBPK: ORAL_TABLET | ORAL | 0 refills | Status: DC

## 2024-12-14 NOTE — Telephone Encounter (Signed)
 When I saw her in December she told me that the Spiriva  was controlling her cough.  Is she off the Macrobid now?  We did discuss that this antibiotic can cause issues with the lungs and she should not be on it.  Another possibility could be that the Cozaar  (her blood pressure medicine) is contributing as well as reflux issues.  We can try another steroid taper and have her come back to be reevaluated.

## 2024-12-14 NOTE — Telephone Encounter (Signed)
 Sent Rx. To her pharmacy

## 2024-12-18 ENCOUNTER — Encounter: Payer: Self-pay | Admitting: Internal Medicine

## 2024-12-18 NOTE — Progress Notes (Signed)
*  OPENED IN ERROR*

## 2024-12-20 ENCOUNTER — Encounter: Payer: Self-pay | Admitting: Internal Medicine

## 2024-12-20 ENCOUNTER — Ambulatory Visit: Admitting: Internal Medicine

## 2024-12-20 VITALS — BP 151/70 | HR 60 | Temp 97.0°F | Resp 12 | Ht 64.0 in | Wt 170.0 lb

## 2024-12-20 DIAGNOSIS — K644 Residual hemorrhoidal skin tags: Secondary | ICD-10-CM

## 2024-12-20 DIAGNOSIS — Z860101 Personal history of adenomatous and serrated colon polyps: Secondary | ICD-10-CM

## 2024-12-20 DIAGNOSIS — K648 Other hemorrhoids: Secondary | ICD-10-CM | POA: Diagnosis not present

## 2024-12-20 DIAGNOSIS — Z1211 Encounter for screening for malignant neoplasm of colon: Secondary | ICD-10-CM

## 2024-12-20 DIAGNOSIS — K5730 Diverticulosis of large intestine without perforation or abscess without bleeding: Secondary | ICD-10-CM | POA: Diagnosis not present

## 2024-12-20 MED ORDER — SODIUM CHLORIDE 0.9 % IV SOLN: 500.0000 mL | Freq: Once | INTRAVENOUS | Status: DC

## 2024-12-20 NOTE — Progress Notes (Signed)
 History and Physical Interval Note:  12/20/2024 3:06 PM  Amy Vang  has presented today for endoscopic procedure(s), with the diagnosis of  Encounter Diagnosis  Name Primary?   Hx of adenomatous colonic polyps Yes  .  The various methods of evaluation and treatment have been discussed with the patient and/or family. After consideration of risks, benefits and other options for treatment, the patient has consented to  the endoscopic procedure(s).   The patient's history has been reviewed, patient examined, no change in status, stable for endoscopic procedure(s).  I have reviewed the patient's chart and labs.  Questions were answered to the patient's satisfaction.     Lupita CHARLENA Commander, MD, [facility]

## 2024-12-20 NOTE — Progress Notes (Signed)
 Pt's states no medical or surgical changes since previsit or office visit.

## 2024-12-20 NOTE — Op Note (Signed)
  [facility] Patient Name: Amy Vang Procedure Date: 12/20/2024 2:33 PM MRN: [id number] Endoscopist: Lupita FORBES Commander , MD, [id number] Age: 79 Referring MD:  Date of Birth: Jul 06, 1946 Gender: Female Account #: [id number] Procedure:                Colonoscopy Indications:              High risk colon cancer surveillance: Personal                            history of colonic polyps, Last colonoscopy: 2019 Medicines:                Monitored Anesthesia Care Procedure:                Pre-Anesthesia Assessment:                           - Prior to the procedure, a History and Physical                            was performed, and patient medications and                            allergies were reviewed. The patient's tolerance of                            previous anesthesia was also reviewed. The risks                            and benefits of the procedure and the sedation                            options and risks were discussed with the patient.                            All questions were answered, and informed consent                            was obtained. Prior Anticoagulants: The patient has                            taken no anticoagulant or antiplatelet agents. ASA                            Grade Assessment: III - A patient with severe                            systemic disease. After reviewing the risks and                            benefits, the patient was deemed in satisfactory                            condition to undergo the procedure.  After obtaining informed consent, the colonoscope                            was passed under direct vision. Throughout the                            procedure, the patient's blood pressure, pulse, and                            oxygen saturations were monitored continuously. The                            Olympus Scope SN: [id number] was introduced through                            the  anus and advanced to the the cecum, identified                            by appendiceal orifice and ileocecal valve. The                            colonoscopy was performed without difficulty. The                            patient tolerated the procedure well. The quality                            of the bowel preparation was good. The ileocecal                            valve, appendiceal orifice, and rectum were                            photographed. The bowel preparation used was SUPREP                            via split dose instruction. Scope In: 3:08:05 PM Scope Out: 3:20:23 PM Scope Withdrawal Time: 0 hours 9 minutes 13 seconds  Total Procedure Duration: 0 hours 12 minutes 18 seconds  Findings:                 The perianal and digital rectal examinations were                            normal.                           Multiple diverticula were found in the sigmoid                            colon.                           External and internal hemorrhoids were found during  retroflexion.                           The exam was otherwise without abnormality on                            direct and retroflexion views. Complications:            No immediate complications. Estimated Blood Loss:     Estimated blood loss: none. Impression:               - Diverticulosis in the sigmoid colon.                           - External and internal hemorrhoids.                           - The examination was otherwise normal on direct                            and retroflexion views.                           - No specimens collected.                           - Personal history of colonic polyps. Recommendation:           - Patient has a contact number available for                            emergencies. The signs and symptoms of potential                            delayed complications were discussed with the                            patient. Return  to normal activities tomorrow.                            Written discharge instructions were provided to the                            patient.                           - Resume previous diet.                           - Continue present medications.                           - No repeat colonoscopy due to age and the absence                            of colonic polyps. Lupita FORBES Commander, MD 12/20/2024 3:28:36 PM This report has been signed electronically.

## 2024-12-20 NOTE — Patient Instructions (Addendum)
 Please read handouts provided. Continue present medications. Resume previous diet.  YOU HAD AN ENDOSCOPIC PROCEDURE TODAY AT THE [facility]:   Refer to the procedure report that was given to you for any specific questions about what was found during the examination.  If the procedure report does not answer your questions, please call your gastroenterologist to clarify.  If you requested that your care partner not be given the details of your procedure findings, then the procedure report has been included in a sealed envelope for you to review at your convenience later.  YOU SHOULD EXPECT: Some feelings of bloating in the abdomen. Passage of more gas than usual.  Walking can help get rid of the air that was put into your GI tract during the procedure and reduce the bloating. If you had a lower endoscopy (such as a colonoscopy or flexible sigmoidoscopy) you may notice spotting of blood in your stool or on the toilet paper. If you underwent a bowel prep for your procedure, you may not have a normal bowel movement for a few days.  Please Note:  You might notice some irritation and congestion in your nose or some drainage.  This is from the oxygen used during your procedure.  There is no need for concern and it should clear up in a day or so.  SYMPTOMS TO REPORT IMMEDIATELY:  Following lower endoscopy (colonoscopy or flexible sigmoidoscopy):  Excessive amounts of blood in the stool  Significant tenderness or worsening of abdominal pains  Swelling of the abdomen that is new, acute  Fever of 100F or higher.  For urgent or emergent issues, a gastroenterologist can be reached at any hour by calling [phone]. Do not use MyChart messaging for urgent concerns.    DIET:  We do recommend a small meal at first, but then you may proceed to your regular diet.  Drink plenty of fluids but you should avoid alcoholic beverages for 24 hours.  ACTIVITY:  You should plan to take it easy for the  rest of today and you should NOT DRIVE or use heavy machinery until tomorrow (because of the sedation medicines used during the test).    FOLLOW UP: Our staff will call the number listed on your records the next business day following your procedure.  We will call around 7:15- 8:00 am to check on you and address any questions or concerns that you may have regarding the information given to you following your procedure. If we do not reach you, we will leave a message.     If any biopsies were taken you will be contacted by phone or by letter within the next 1-3 weeks.  Please call us  at [phone] if you have not heard about the biopsies in 3 weeks.    SIGNATURES/CONFIDENTIALITY: You and/or your care partner have signed paperwork which will be entered into your electronic medical record.  These signatures attest to the fact that that the information above on your After Visit Summary has been reviewed and is understood.  Full responsibility of the confidentiality of this discharge information lies with you and/or your care-partner.No polyps or cancer were found.  You do have diverticulosis - thickened muscle rings and pouches in the colon wall. Please read the handout about this condition.  Small hemorrhoids also seen.  I appreciate the opportunity to care for you. Lupita CHARLENA Commander, MD, [facility]

## 2024-12-20 NOTE — Progress Notes (Signed)
 Transferred to PACU via stretcher.  Not responding to stimulation at this time.  VSS upon leaving procedure room.

## 2024-12-20 NOTE — Progress Notes (Signed)
 Patient just took last dose of prednisone  dose pack for cough.

## 2024-12-21 ENCOUNTER — Telehealth: Payer: Self-pay

## 2024-12-21 NOTE — Telephone Encounter (Deleted)
" °  Follow up Call-     12/20/2024    2:38 PM  Call back number  Post procedure Call Back phone  # [phone]  Permission to leave phone message Yes     Patient questions:  Do you have a fever, pain , or abdominal swelling? {yes no:314532} Pain Score  {NUMBERS; 0-10:5044} *  Have you tolerated food without any problems? {yes no:314532}  Have you been able to return to your normal activities? {yes no:314532}  Do you have any questions about your discharge instructions: Diet   {yes no:314532} Medications  {yes no:314532} Follow up visit  {yes no:314532}  Do you have questions or concerns about your Care? {yes no:314532}  Actions: * If pain score is 4 or above: {ACTION; [facility] ENDO PAIN >4:21563}   "

## 2024-12-21 NOTE — Telephone Encounter (Signed)
 Follow up call to pt, lm for pt to call if having any difficulty with normal activities or eating and drinking.  Also to call if any other questions or concerns.

## 2025-01-03 ENCOUNTER — Encounter: Payer: Self-pay | Admitting: Pulmonary Disease

## 2025-01-03 ENCOUNTER — Other Ambulatory Visit (HOSPITAL_COMMUNITY): Payer: Self-pay

## 2025-01-04 ENCOUNTER — Encounter: Payer: Self-pay | Admitting: Pulmonary Disease

## 2025-01-05 ENCOUNTER — Encounter: Payer: Self-pay | Admitting: Neurology

## 2025-01-05 MED ORDER — FLUTICASONE-SALMETEROL 250-50 MCG/ACT IN AEPB: 1.0000 | INHALATION_SPRAY | Freq: Two times a day (BID) | RESPIRATORY_TRACT | 5 refills | Status: AC

## 2025-01-05 NOTE — Telephone Encounter (Signed)
 We can change to Advair 250/50, 1 inhalation twice a day.

## 2025-01-12 ENCOUNTER — Encounter

## 2025-02-12 ENCOUNTER — Ambulatory Visit: Admitting: Pulmonary Disease

## 2025-02-19 ENCOUNTER — Ambulatory Visit: Admitting: Oncology

## 2025-06-28 ENCOUNTER — Ambulatory Visit: Admitting: Neurology

## 2025-08-29 ENCOUNTER — Ambulatory Visit
